# Patient Record
Sex: Female | Born: 1970
Health system: Southern US, Community
[De-identification: ages and names within clinical notes are randomized; demographics above are authoritative.]

## PROBLEM LIST (undated history)

## (undated) DIAGNOSIS — Z87442 Personal history of urinary calculi: Secondary | ICD-10-CM

## (undated) DIAGNOSIS — G43909 Migraine, unspecified, not intractable, without status migrainosus: Secondary | ICD-10-CM

## (undated) DIAGNOSIS — F329 Major depressive disorder, single episode, unspecified: Secondary | ICD-10-CM

## (undated) DIAGNOSIS — M940 Chondrocostal junction syndrome [Tietze]: Secondary | ICD-10-CM

## (undated) DIAGNOSIS — K589 Irritable bowel syndrome without diarrhea: Secondary | ICD-10-CM

## (undated) DIAGNOSIS — N200 Calculus of kidney: Secondary | ICD-10-CM

## (undated) DIAGNOSIS — M51369 Other intervertebral disc degeneration, lumbar region without mention of lumbar back pain or lower extremity pain: Secondary | ICD-10-CM

## (undated) DIAGNOSIS — M5136 Other intervertebral disc degeneration, lumbar region: Secondary | ICD-10-CM

## (undated) DIAGNOSIS — J309 Allergic rhinitis, unspecified: Secondary | ICD-10-CM

## (undated) DIAGNOSIS — R3989 Other symptoms and signs involving the genitourinary system: Secondary | ICD-10-CM

## (undated) DIAGNOSIS — T4145XA Adverse effect of unspecified anesthetic, initial encounter: Secondary | ICD-10-CM

## (undated) DIAGNOSIS — I10 Essential (primary) hypertension: Secondary | ICD-10-CM

## (undated) DIAGNOSIS — Z8719 Personal history of other diseases of the digestive system: Secondary | ICD-10-CM

## (undated) DIAGNOSIS — F419 Anxiety disorder, unspecified: Secondary | ICD-10-CM

## (undated) DIAGNOSIS — K219 Gastro-esophageal reflux disease without esophagitis: Secondary | ICD-10-CM

## (undated) DIAGNOSIS — R112 Nausea with vomiting, unspecified: Secondary | ICD-10-CM

## (undated) DIAGNOSIS — N3010 Interstitial cystitis (chronic) without hematuria: Secondary | ICD-10-CM

## (undated) DIAGNOSIS — Z86010 Personal history of colonic polyps: Secondary | ICD-10-CM

## (undated) DIAGNOSIS — Z9889 Other specified postprocedural states: Secondary | ICD-10-CM

## (undated) DIAGNOSIS — M1990 Unspecified osteoarthritis, unspecified site: Secondary | ICD-10-CM

## (undated) DIAGNOSIS — M4800 Spinal stenosis, site unspecified: Secondary | ICD-10-CM

## (undated) DIAGNOSIS — Q615 Medullary cystic kidney: Secondary | ICD-10-CM

## (undated) DIAGNOSIS — L4050 Arthropathic psoriasis, unspecified: Secondary | ICD-10-CM

## (undated) DIAGNOSIS — F32A Depression, unspecified: Secondary | ICD-10-CM

## (undated) DIAGNOSIS — T8859XA Other complications of anesthesia, initial encounter: Secondary | ICD-10-CM

## (undated) DIAGNOSIS — Z860101 Personal history of adenomatous and serrated colon polyps: Secondary | ICD-10-CM

## (undated) DIAGNOSIS — D1803 Hemangioma of intra-abdominal structures: Secondary | ICD-10-CM

## (undated) DIAGNOSIS — R3915 Urgency of urination: Secondary | ICD-10-CM

## (undated) HISTORY — DX: Calculus of kidney: N20.0

## (undated) HISTORY — DX: Irritable bowel syndrome, unspecified: K58.9

## (undated) HISTORY — DX: Arthropathic psoriasis, unspecified: L40.50

## (undated) HISTORY — DX: Spinal stenosis, site unspecified: M48.00

## (undated) HISTORY — DX: Gastro-esophageal reflux disease without esophagitis: K21.9

## (undated) HISTORY — PX: CARPAL TUNNEL RELEASE: SHX101

## (undated) HISTORY — DX: Anxiety disorder, unspecified: F41.9

## (undated) HISTORY — DX: Essential (primary) hypertension: I10

## (undated) HISTORY — DX: Medullary cystic kidney: Q61.5

## (undated) HISTORY — DX: Major depressive disorder, single episode, unspecified: F32.9

## (undated) HISTORY — PX: DIAGNOSTIC LAPAROSCOPY: SUR761

## (undated) HISTORY — DX: Chondrocostal junction syndrome (tietze): M94.0

## (undated) HISTORY — DX: Hemangioma of intra-abdominal structures: D18.03

## (undated) HISTORY — PX: DILATION AND CURETTAGE OF UTERUS: SHX78

## (undated) HISTORY — DX: Depression, unspecified: F32.A

## (undated) HISTORY — PX: PLANTAR FASCIA SURGERY: SHX746

---

## 2000-03-24 ENCOUNTER — Encounter: Payer: Self-pay | Admitting: Specialist

## 2000-03-24 ENCOUNTER — Ambulatory Visit (HOSPITAL_COMMUNITY): Admission: RE | Admit: 2000-03-24 | Discharge: 2000-03-24 | Payer: Self-pay | Admitting: Specialist

## 2000-04-09 ENCOUNTER — Ambulatory Visit (HOSPITAL_COMMUNITY): Admission: RE | Admit: 2000-04-09 | Discharge: 2000-04-09 | Payer: Self-pay | Admitting: Specialist

## 2000-04-09 ENCOUNTER — Encounter: Payer: Self-pay | Admitting: Specialist

## 2000-04-23 ENCOUNTER — Ambulatory Visit (HOSPITAL_COMMUNITY): Admission: RE | Admit: 2000-04-23 | Discharge: 2000-04-23 | Payer: Self-pay | Admitting: Specialist

## 2000-04-23 ENCOUNTER — Encounter: Payer: Self-pay | Admitting: Specialist

## 2000-05-31 ENCOUNTER — Encounter: Payer: Self-pay | Admitting: Specialist

## 2000-05-31 ENCOUNTER — Ambulatory Visit (HOSPITAL_COMMUNITY): Admission: RE | Admit: 2000-05-31 | Discharge: 2000-05-31 | Payer: Self-pay | Admitting: Specialist

## 2000-12-29 ENCOUNTER — Encounter: Payer: Self-pay | Admitting: Family Medicine

## 2000-12-29 ENCOUNTER — Ambulatory Visit (HOSPITAL_COMMUNITY): Admission: RE | Admit: 2000-12-29 | Discharge: 2000-12-29 | Payer: Self-pay | Admitting: Family Medicine

## 2001-02-01 ENCOUNTER — Other Ambulatory Visit: Admission: RE | Admit: 2001-02-01 | Discharge: 2001-02-01 | Payer: Self-pay | Admitting: *Deleted

## 2001-02-11 ENCOUNTER — Encounter: Payer: Self-pay | Admitting: Gastroenterology

## 2001-02-11 ENCOUNTER — Encounter: Admission: RE | Admit: 2001-02-11 | Discharge: 2001-02-11 | Payer: Self-pay | Admitting: Gastroenterology

## 2001-03-10 ENCOUNTER — Encounter: Admission: RE | Admit: 2001-03-10 | Discharge: 2001-03-10 | Payer: Self-pay | Admitting: Gastroenterology

## 2001-03-10 ENCOUNTER — Encounter: Payer: Self-pay | Admitting: Gastroenterology

## 2001-04-11 ENCOUNTER — Encounter: Payer: Self-pay | Admitting: Urology

## 2001-04-11 ENCOUNTER — Ambulatory Visit (HOSPITAL_COMMUNITY): Admission: RE | Admit: 2001-04-11 | Discharge: 2001-04-11 | Payer: Self-pay | Admitting: Urology

## 2001-06-15 HISTORY — PX: PERCUTANEOUS NEPHROSTOLITHOTOMY: SHX2207

## 2001-07-05 ENCOUNTER — Ambulatory Visit (HOSPITAL_COMMUNITY): Admission: RE | Admit: 2001-07-05 | Discharge: 2001-07-05 | Payer: Self-pay | Admitting: Urology

## 2001-07-05 ENCOUNTER — Encounter: Payer: Self-pay | Admitting: Urology

## 2001-08-03 ENCOUNTER — Encounter: Payer: Self-pay | Admitting: Urology

## 2001-08-03 ENCOUNTER — Ambulatory Visit (HOSPITAL_COMMUNITY): Admission: RE | Admit: 2001-08-03 | Discharge: 2001-08-03 | Payer: Self-pay | Admitting: Urology

## 2001-08-05 ENCOUNTER — Ambulatory Visit (HOSPITAL_COMMUNITY): Admission: RE | Admit: 2001-08-05 | Discharge: 2001-08-05 | Payer: Self-pay | Admitting: Urology

## 2001-08-05 ENCOUNTER — Encounter: Payer: Self-pay | Admitting: Urology

## 2001-08-26 ENCOUNTER — Encounter: Payer: Self-pay | Admitting: Urology

## 2001-08-26 ENCOUNTER — Ambulatory Visit (HOSPITAL_COMMUNITY): Admission: RE | Admit: 2001-08-26 | Discharge: 2001-08-26 | Payer: Self-pay | Admitting: Urology

## 2001-09-22 ENCOUNTER — Encounter: Payer: Self-pay | Admitting: Urology

## 2001-09-22 ENCOUNTER — Ambulatory Visit (HOSPITAL_COMMUNITY): Admission: RE | Admit: 2001-09-22 | Discharge: 2001-09-22 | Payer: Self-pay | Admitting: Urology

## 2001-10-26 ENCOUNTER — Ambulatory Visit (HOSPITAL_COMMUNITY): Admission: RE | Admit: 2001-10-26 | Discharge: 2001-10-26 | Payer: Self-pay | Admitting: Urology

## 2001-10-26 ENCOUNTER — Encounter: Payer: Self-pay | Admitting: Urology

## 2001-12-19 ENCOUNTER — Ambulatory Visit (HOSPITAL_COMMUNITY): Admission: RE | Admit: 2001-12-19 | Discharge: 2001-12-19 | Payer: Self-pay | Admitting: Urology

## 2001-12-19 ENCOUNTER — Encounter: Payer: Self-pay | Admitting: Urology

## 2001-12-21 ENCOUNTER — Ambulatory Visit (HOSPITAL_COMMUNITY): Admission: RE | Admit: 2001-12-21 | Discharge: 2001-12-21 | Payer: Self-pay | Admitting: Urology

## 2001-12-21 ENCOUNTER — Encounter: Payer: Self-pay | Admitting: Urology

## 2001-12-22 ENCOUNTER — Encounter: Payer: Self-pay | Admitting: Urology

## 2001-12-22 ENCOUNTER — Ambulatory Visit (HOSPITAL_COMMUNITY): Admission: RE | Admit: 2001-12-22 | Discharge: 2001-12-22 | Payer: Self-pay | Admitting: Urology

## 2001-12-29 ENCOUNTER — Encounter: Payer: Self-pay | Admitting: Urology

## 2001-12-29 ENCOUNTER — Ambulatory Visit (HOSPITAL_COMMUNITY): Admission: RE | Admit: 2001-12-29 | Discharge: 2001-12-29 | Payer: Self-pay | Admitting: Urology

## 2002-01-25 ENCOUNTER — Encounter: Payer: Self-pay | Admitting: Urology

## 2002-01-25 ENCOUNTER — Ambulatory Visit (HOSPITAL_COMMUNITY): Admission: RE | Admit: 2002-01-25 | Discharge: 2002-01-25 | Payer: Self-pay | Admitting: Urology

## 2002-03-13 ENCOUNTER — Ambulatory Visit (HOSPITAL_COMMUNITY): Admission: RE | Admit: 2002-03-13 | Discharge: 2002-03-13 | Payer: Self-pay | Admitting: Urology

## 2002-03-13 ENCOUNTER — Encounter: Payer: Self-pay | Admitting: Urology

## 2002-05-16 ENCOUNTER — Emergency Department (HOSPITAL_COMMUNITY): Admission: EM | Admit: 2002-05-16 | Discharge: 2002-05-16 | Payer: Self-pay | Admitting: *Deleted

## 2003-02-08 ENCOUNTER — Emergency Department (HOSPITAL_COMMUNITY): Admission: EM | Admit: 2003-02-08 | Discharge: 2003-02-08 | Payer: Self-pay | Admitting: Emergency Medicine

## 2003-07-20 ENCOUNTER — Emergency Department (HOSPITAL_COMMUNITY): Admission: EM | Admit: 2003-07-20 | Discharge: 2003-07-20 | Payer: Self-pay | Admitting: Emergency Medicine

## 2004-06-15 DIAGNOSIS — D1803 Hemangioma of intra-abdominal structures: Secondary | ICD-10-CM

## 2004-06-15 HISTORY — DX: Hemangioma of intra-abdominal structures: D18.03

## 2004-06-16 ENCOUNTER — Emergency Department (HOSPITAL_COMMUNITY): Admission: EM | Admit: 2004-06-16 | Discharge: 2004-06-16 | Payer: Self-pay | Admitting: Emergency Medicine

## 2004-06-18 ENCOUNTER — Ambulatory Visit (HOSPITAL_COMMUNITY): Admission: RE | Admit: 2004-06-18 | Discharge: 2004-06-18 | Payer: Self-pay | Admitting: Urology

## 2004-06-19 ENCOUNTER — Ambulatory Visit (HOSPITAL_COMMUNITY): Admission: RE | Admit: 2004-06-19 | Discharge: 2004-06-19 | Payer: Self-pay | Admitting: Urology

## 2004-07-02 ENCOUNTER — Ambulatory Visit (HOSPITAL_COMMUNITY): Admission: RE | Admit: 2004-07-02 | Discharge: 2004-07-02 | Payer: Self-pay | Admitting: Urology

## 2004-07-04 ENCOUNTER — Ambulatory Visit (HOSPITAL_COMMUNITY): Admission: RE | Admit: 2004-07-04 | Discharge: 2004-07-04 | Payer: Self-pay | Admitting: Urology

## 2004-08-13 ENCOUNTER — Ambulatory Visit (HOSPITAL_COMMUNITY): Admission: RE | Admit: 2004-08-13 | Discharge: 2004-08-13 | Payer: Self-pay | Admitting: Urology

## 2004-09-18 ENCOUNTER — Ambulatory Visit: Payer: Self-pay | Admitting: Internal Medicine

## 2004-12-29 ENCOUNTER — Ambulatory Visit (HOSPITAL_COMMUNITY): Admission: RE | Admit: 2004-12-29 | Discharge: 2004-12-29 | Payer: Self-pay | Admitting: Urology

## 2005-01-30 ENCOUNTER — Emergency Department (HOSPITAL_COMMUNITY): Admission: EM | Admit: 2005-01-30 | Discharge: 2005-01-30 | Payer: Self-pay | Admitting: Emergency Medicine

## 2005-03-10 ENCOUNTER — Ambulatory Visit (HOSPITAL_COMMUNITY): Admission: RE | Admit: 2005-03-10 | Discharge: 2005-03-10 | Payer: Self-pay | Admitting: Urology

## 2005-04-27 ENCOUNTER — Emergency Department (HOSPITAL_COMMUNITY): Admission: EM | Admit: 2005-04-27 | Discharge: 2005-04-27 | Payer: Self-pay | Admitting: Emergency Medicine

## 2005-05-08 ENCOUNTER — Ambulatory Visit (HOSPITAL_COMMUNITY): Admission: RE | Admit: 2005-05-08 | Discharge: 2005-05-08 | Payer: Self-pay | Admitting: Family Medicine

## 2005-06-01 ENCOUNTER — Ambulatory Visit (HOSPITAL_COMMUNITY): Admission: RE | Admit: 2005-06-01 | Discharge: 2005-06-01 | Payer: Self-pay | Admitting: Otolaryngology

## 2005-06-23 ENCOUNTER — Encounter (HOSPITAL_COMMUNITY): Admission: RE | Admit: 2005-06-23 | Discharge: 2005-07-23 | Payer: Self-pay | Admitting: Neurosurgery

## 2005-07-14 ENCOUNTER — Ambulatory Visit (HOSPITAL_COMMUNITY): Admission: RE | Admit: 2005-07-14 | Discharge: 2005-07-14 | Payer: Self-pay | Admitting: Urology

## 2005-07-27 ENCOUNTER — Encounter (HOSPITAL_COMMUNITY): Admission: RE | Admit: 2005-07-27 | Discharge: 2005-08-26 | Payer: Self-pay | Admitting: Neurosurgery

## 2005-08-28 ENCOUNTER — Encounter (HOSPITAL_COMMUNITY): Admission: RE | Admit: 2005-08-28 | Discharge: 2005-09-27 | Payer: Self-pay | Admitting: Neurosurgery

## 2005-12-31 ENCOUNTER — Ambulatory Visit (HOSPITAL_COMMUNITY): Admission: RE | Admit: 2005-12-31 | Discharge: 2005-12-31 | Payer: Self-pay | Admitting: Urology

## 2006-01-25 ENCOUNTER — Ambulatory Visit (HOSPITAL_COMMUNITY): Admission: RE | Admit: 2006-01-25 | Discharge: 2006-01-25 | Payer: Self-pay | Admitting: Urology

## 2006-08-09 ENCOUNTER — Ambulatory Visit (HOSPITAL_COMMUNITY): Admission: RE | Admit: 2006-08-09 | Discharge: 2006-08-09 | Payer: Self-pay | Admitting: Urology

## 2006-08-23 ENCOUNTER — Ambulatory Visit (HOSPITAL_COMMUNITY): Admission: RE | Admit: 2006-08-23 | Discharge: 2006-08-23 | Payer: Self-pay | Admitting: Family Medicine

## 2006-11-16 ENCOUNTER — Ambulatory Visit (HOSPITAL_COMMUNITY): Admission: RE | Admit: 2006-11-16 | Discharge: 2006-11-16 | Payer: Self-pay | Admitting: Urology

## 2006-12-20 ENCOUNTER — Ambulatory Visit (HOSPITAL_COMMUNITY): Admission: RE | Admit: 2006-12-20 | Discharge: 2006-12-20 | Payer: Self-pay | Admitting: Family Medicine

## 2007-11-15 ENCOUNTER — Ambulatory Visit (HOSPITAL_COMMUNITY): Payer: Self-pay | Admitting: Psychiatry

## 2007-12-27 ENCOUNTER — Ambulatory Visit (HOSPITAL_COMMUNITY): Payer: Self-pay | Admitting: Psychiatry

## 2008-01-26 ENCOUNTER — Encounter (HOSPITAL_COMMUNITY): Admission: RE | Admit: 2008-01-26 | Discharge: 2008-02-25 | Payer: Self-pay | Admitting: Family Medicine

## 2008-02-23 ENCOUNTER — Ambulatory Visit (HOSPITAL_COMMUNITY): Payer: Self-pay | Admitting: Psychiatry

## 2008-04-10 ENCOUNTER — Ambulatory Visit (HOSPITAL_COMMUNITY): Admission: RE | Admit: 2008-04-10 | Discharge: 2008-04-10 | Payer: Self-pay | Admitting: Urology

## 2008-05-15 ENCOUNTER — Ambulatory Visit (HOSPITAL_COMMUNITY): Payer: Self-pay | Admitting: Psychiatry

## 2008-06-26 ENCOUNTER — Ambulatory Visit (HOSPITAL_COMMUNITY): Payer: Self-pay | Admitting: Psychiatry

## 2008-07-24 ENCOUNTER — Ambulatory Visit (HOSPITAL_COMMUNITY): Admission: RE | Admit: 2008-07-24 | Discharge: 2008-07-24 | Payer: Self-pay | Admitting: Urology

## 2008-09-06 ENCOUNTER — Ambulatory Visit (HOSPITAL_COMMUNITY): Payer: Self-pay | Admitting: Psychiatry

## 2008-12-15 ENCOUNTER — Emergency Department (HOSPITAL_COMMUNITY): Admission: EM | Admit: 2008-12-15 | Discharge: 2008-12-15 | Payer: Self-pay | Admitting: Emergency Medicine

## 2009-01-29 ENCOUNTER — Ambulatory Visit (HOSPITAL_COMMUNITY): Admission: RE | Admit: 2009-01-29 | Discharge: 2009-01-29 | Payer: Self-pay | Admitting: Urology

## 2009-09-10 ENCOUNTER — Ambulatory Visit (HOSPITAL_COMMUNITY): Admission: RE | Admit: 2009-09-10 | Discharge: 2009-09-10 | Payer: Self-pay | Admitting: Family Medicine

## 2009-09-17 ENCOUNTER — Encounter: Admission: RE | Admit: 2009-09-17 | Discharge: 2009-09-17 | Payer: Self-pay | Admitting: Family Medicine

## 2009-11-05 ENCOUNTER — Ambulatory Visit (HOSPITAL_COMMUNITY): Admission: RE | Admit: 2009-11-05 | Discharge: 2009-11-05 | Payer: Self-pay | Admitting: Family Medicine

## 2009-12-24 ENCOUNTER — Ambulatory Visit (HOSPITAL_COMMUNITY): Admission: RE | Admit: 2009-12-24 | Discharge: 2009-12-24 | Payer: Self-pay | Admitting: Family Medicine

## 2010-01-24 DIAGNOSIS — K219 Gastro-esophageal reflux disease without esophagitis: Secondary | ICD-10-CM | POA: Insufficient documentation

## 2010-01-29 ENCOUNTER — Ambulatory Visit: Payer: Self-pay | Admitting: Internal Medicine

## 2010-01-29 DIAGNOSIS — K5900 Constipation, unspecified: Secondary | ICD-10-CM | POA: Insufficient documentation

## 2010-01-31 ENCOUNTER — Encounter: Payer: Self-pay | Admitting: Internal Medicine

## 2010-02-10 ENCOUNTER — Encounter (INDEPENDENT_AMBULATORY_CARE_PROVIDER_SITE_OTHER): Payer: Self-pay | Admitting: *Deleted

## 2010-02-11 ENCOUNTER — Encounter: Payer: Self-pay | Admitting: Internal Medicine

## 2010-03-28 ENCOUNTER — Encounter: Payer: Self-pay | Admitting: Specialist

## 2010-04-01 ENCOUNTER — Ambulatory Visit (HOSPITAL_COMMUNITY): Admission: RE | Admit: 2010-04-01 | Discharge: 2010-04-01 | Payer: Self-pay | Admitting: Specialist

## 2010-05-30 ENCOUNTER — Ambulatory Visit (HOSPITAL_COMMUNITY)
Admission: RE | Admit: 2010-05-30 | Discharge: 2010-05-30 | Payer: Self-pay | Source: Home / Self Care | Attending: Specialist | Admitting: Specialist

## 2010-07-15 NOTE — Assessment & Plan Note (Signed)
Summary: IBS SYMPTOMS, UPPER RIGHT ABD PAIN/ACID REFLUX/SS   Visit Type:  Initial Consult Referring Provider:  Lubertha South Primary Care Provider:  Lubertha South  Chief Complaint:  IBS symptoms/abd pain/reflux.  History of Present Illness:  40 year old lady sent over out of the courtsy of Dr. Gerda Diss to further evaluate intermittent episodes of constipation and worsening GERD symptoms. Symptoms for several years. Describe symptoms heartburn. Has been on over-the-counter acid suppressive agents with significant improvement but only short-lived. No melena or rectal bleeding; no dysphagia. Has upper abdominal /epigastric discomfort along with the heartburn. Chonically constipated and feels bloated. She uses  MiraLax for bowel function but takes it sparingly. When she has a bowel movement, she feels better. Otherwise really does any significant discomfort which she perceives procedures length to constipation. She has been on Hyomax with some improvement in cramping but states it may be making constipation worse.   Sne never had an upper GI tract evaluation. She has a history of hepatic hemangiomas -stable  going back to CT scan  in 2006. Recent ultrasound demonstrated no new abnormalities; gallbladder looked okay. LFTs were completely normal. CBC looked good except for minimally depressed hemoglobin.   Current Medications (verified): 1)  Serzone 200mg  .... Take 1 Tablet By Mouth Two Times A Day 2)  Wellbutrin Sr 150 Mg Xr12h-Tab (Bupropion Hcl) .... Take 1 Tablet By Mouth Two Times A Day 3)  Alprazolam 0.5 Mg Tabs (Alprazolam) .... Take 1 Tablet By Mouth Two Times A Day 4)  Advil 200 Mg Tabs (Ibuprofen) .... Take 1 Tablet By Mouth Two Times A Day 5)  One-A-Day Extras Antioxidant  Caps (Multiple Vitamins-Minerals) 6)  Hydrochlorothiazide 25 Mg Tabs (Hydrochlorothiazide) .... 1/2 Tablet Daily 7)  Zegerid 40-1100 Mg Caps (Omeprazole-Sodium Bicarbonate) .... Take 1 Tablet By Mouth Once A Day 8)  Hyomax-Sl  0.125 Mg Subl (Hyoscyamine Sulfate) .... As Needed  Allergies (verified): 1)  ! Sulfa 2)  ! Ultram  Past History:  Family History: Last updated: 06-Feb-2010 Father: Deceased age 36   Melenoma Mother: Deceased age 41   Schleroderma Siblings: none  Social History: Last updated: 02-06-2010 Marital Status: No Children: No Occupation: Human resources officer  Past Medical History: Hypertension Anxiety and Depression Reflux IBS  Past Surgical History: Kidney stone surgery x 2     2011  (numberous others ) Right Foot surgery  Family History: Father: Deceased age 66   Melenoma Mother: Deceased age 67   Schleroderma Siblings: none  Social History: Marital Status: No Children: No Occupation: Human resources officer  Vital Signs:  Patient profile:   40 year old female Height:      63 inches Weight:      164.50 pounds BMI:     29.25 Temp:     97.9 degrees F oral Pulse rate:   72 / minute BP sitting:   120 / 80  (left arm) Cuff size:   regular  Vitals Entered By: Cloria Spring LPN (February 06, 2010 11:28 AM)  Physical Exam  General:  alert conversant pleasant lady resting comfortably Lungs:  clear to auscultation Heart:  regular rate rhythm without murmur gallop rub Abdomen:  flat positive bowel sounds soft she does have epigastric tender to palpation no appreciable mass or hepatosplenomegaly  Impression & Recommendations: Impression: Pleasant 40 year old lady with insidious worsening typical reflux symptoms without any alarm features. She does have some symptoms consistent with dyspepsia and irritable bowel syndrome. She does relate some improvement to acid suppression therapy but really hasn't taken substantial acid  suppression therapy for any period of time. She has constipation which is more likely related to functional illness.  Antispasmodic therapy may have worsened constipation recently.  Stable hepatic hemangiomas on recent imaging. LFTs are normal. Gallbladder looks okay on  ultrasound.  Recommendations: Proceed with a diagnostic EGD in the near future to further evaluate crescendo, worsening reflux symptoms and further evaluate her epigastric pain. I discussed this approach with this nice lady.  Risks, benefits, limitations, alternatives and imponderables have been reviewed;  her questions have been answered; she is agreeable.  In addition, I'd like her to liberalize use of MiraLax i.e. take 17 g orally at bedtime if no bowel movement on any given day. Our goal being to facilitate a bowel movement daily to every other day. I have also asked her to stop Hyomax.  Begin digestive advantage probiotic formula for constipation. I provided her with samples.  Appended Document: Orders Update    Clinical Lists Changes  Problems: Added new problem of CONSTIPATION (ICD-564.00) Orders: Added new Service order of Consultation Level IV [phone]) - Signed

## 2010-07-15 NOTE — Letter (Signed)
Summary: removal of wrong DX codes

## 2010-07-15 NOTE — Letter (Signed)
Summary: Internal Other /EGD ORDER  Internal Other /EGD ORDER   Imported By: Cloria Spring LPN 16/03/9603 54:09:81  _____________________________________________________________________  External Attachment:    Type:   Image     Comment:   External Document

## 2010-07-15 NOTE — Letter (Signed)
Summary: RADIOLOGY U/S ABDOMEN   RADIOLOGY U/S ABDOMEN   Imported By: Rexene Alberts 02/11/2010 15:58:21  _____________________________________________________________________  External Attachment:    Type:   Image     Comment:   External Document

## 2010-07-15 NOTE — Letter (Signed)
Summary: LABS FROM [facility]  LABS FROM [facility]   Imported By: Rexene Alberts 02/11/2010 15:59:32  _____________________________________________________________________  External Attachment:    Type:   Image     Comment:   External Document

## 2010-08-25 LAB — CBC
HCT: 37.5 % (ref 36.0–46.0)
Hemoglobin: 12.1 g/dL (ref 12.0–15.0)
MCH: 28.5 pg (ref 26.0–34.0)
MCHC: 32.3 g/dL (ref 30.0–36.0)
Platelets: 315 10*3/uL (ref 150–400)
RBC: 4.25 MIL/uL (ref 3.87–5.11)
RDW: 13.9 % (ref 11.5–15.5)
WBC: 7.1 10*3/uL (ref 4.0–10.5)

## 2010-08-25 LAB — BASIC METABOLIC PANEL
CO2: 29 mEq/L (ref 19–32)
GFR calc Af Amer: >60 mL/min (ref >60)
Potassium: 3.7 mEq/L (ref 3.5–5.1)
Sodium: 138 mEq/L (ref 135–145)

## 2010-08-25 LAB — SURGICAL PCR SCREEN: MRSA, PCR: NEGATIVE

## 2010-08-28 LAB — CBC
Hemoglobin: 11.5 g/dL — ABNORMAL LOW (ref 12.0–15.0)
MCV: 88.7 fL (ref 78.0–100.0)
RDW: 13.5 % (ref 11.5–15.5)

## 2010-08-28 LAB — BASIC METABOLIC PANEL
Creatinine, Ser: 0.84 mg/dL (ref 0.4–1.2)
Glucose, Bld: 85 mg/dL (ref 70–99)
Sodium: 139 mEq/L (ref 135–145)

## 2010-08-28 LAB — SURGICAL PCR SCREEN
MRSA, PCR: NEGATIVE
Staphylococcus aureus: NEGATIVE

## 2010-09-15 LAB — LIPID PANEL
Cholesterol: 192 mg/dL (ref 0–200)
LDL Cholesterol: 103 mg/dL
Triglycerides: 87 mg/dL (ref 40–160)

## 2010-09-23 ENCOUNTER — Encounter: Payer: Self-pay | Admitting: Gastroenterology

## 2010-09-23 ENCOUNTER — Ambulatory Visit (INDEPENDENT_AMBULATORY_CARE_PROVIDER_SITE_OTHER): Payer: Self-pay | Admitting: Gastroenterology

## 2010-09-23 DIAGNOSIS — R1013 Epigastric pain: Secondary | ICD-10-CM | POA: Insufficient documentation

## 2010-09-23 DIAGNOSIS — K589 Irritable bowel syndrome without diarrhea: Secondary | ICD-10-CM

## 2010-09-23 DIAGNOSIS — K625 Hemorrhage of anus and rectum: Secondary | ICD-10-CM

## 2010-09-23 DIAGNOSIS — K219 Gastro-esophageal reflux disease without esophagitis: Secondary | ICD-10-CM

## 2010-09-23 MED ORDER — PEG 3350-KCL-NA BICARB-NACL 420 G PO SOLR: ORAL | Status: AC

## 2010-09-23 MED ORDER — HYOSCYAMINE SULFATE 0.125 MG SL SUBL: 0.1250 mg | SUBLINGUAL_TABLET | SUBLINGUAL | Status: AC | PRN

## 2010-09-23 MED ORDER — ESOMEPRAZOLE MAGNESIUM 40 MG PO CPDR: 40.0000 mg | DELAYED_RELEASE_CAPSULE | Freq: Every day | ORAL | Status: DC

## 2010-09-23 NOTE — Assessment & Plan Note (Signed)
Likely dyspepsia but need to rule out gastritis, peptic ulcer disease. EGD in the near future.  I have discussed the risks, alternatives, benefits with regards to but not limited to the risk of reaction to medication, bleeding, infection, perforation and the patient is agreeable to proceed. Written consent to be obtained.

## 2010-09-23 NOTE — Assessment & Plan Note (Signed)
Chronic GERD without alarm symptoms at this time. She does have some epigastric pain which may be due to dyspepsia but recommend further evaluation. EGD in the near future. I have discussed the risks, alternatives, benefits with regards to but not limited to the risk of reaction to medication, bleeding, infection, perforation and the patient is agreeable to proceed. Written consent to be obtained.

## 2010-09-23 NOTE — Progress Notes (Signed)
Primary Care Physician:  Harlow Asa, MD  Primary Gastroenterologist:  Dr. Jonette Eva  Chief Complaint  Patient presents with  . Abdominal Pain       . Rectal Bleeding    HPI:  Bethany King is a 40 y.o. female here for further evaluation of abdominal pain and rectal bleeding. She has a history of chronic GERD, symptoms poorly controlled with Prevacid 15 mg daily which she buys over-the-counter. Has done very well in the past on Nexium but her insurance card would not cover this before. Denies any dysphagia. Never had EGD last year, driver never showed up. Heartburn all the time. Abdominal pain constant unrelated to meals. Started back on Bentyl but complains of drowsiness. Previously did well and hyoscyamine. Eating bland diet. Minimal caffeine intake. No vomiting. Some nausea. BM normal, twice daily. No diarrhea. Two weeks ago, moderate volume brbpr, rectal itching/burning. Happened three times. None in two weeks. In past some brbpr with hard stools. No prior EGD/TCS. Second cousin had colon cancer at age 34, patient here,Bethany King. Patient has seen Dr. Jena Gauss in the past but would like to be seen by Dr. Darrick Penna as she follows multiple of her family members.   Current Outpatient Prescriptions  Medication Sig Dispense Refill  . dicyclomine (BENTYL) 10 MG capsule Take 10 mg by mouth 2 (two) times daily.        . hydrochlorothiazide (,MICROZIDE/HYDRODIURIL,) 12.5 MG capsule Take 12.5 mg by mouth daily.        . lansoprazole (PREVACID) 15 MG capsule Take 15 mg by mouth daily.        Marland Kitchen loratadine (CLARITIN) 10 MG tablet Take 10 mg by mouth daily.        . Vilazodone HCl (VIIBRYD) 40 MG TABS Take 1 tablet by mouth daily.        Marland Kitchen esomeprazole (NEXIUM) 40 MG capsule Take 1 capsule (40 mg total) by mouth daily before breakfast.  30 capsule  11  . hyoscyamine (HYOMAX-SL) 0.125 MG SL tablet Place 1 tablet (0.125 mg total) under the tongue every 4 (four) hours as needed for cramping.  120  tablet  0    Allergies as of 09/23/2010 - Review Complete 09/23/2010  Allergen Reaction Noted  . Tramadol hcl Other (See Comments)   . Sulfonamide derivatives Rash     Past Medical History  Diagnosis Date  . GERD (gastroesophageal reflux disease)   . Irritable bowel syndrome (IBS)   . HTN (hypertension)   . Anxiety   . Depression   . Multiple hemangiomas 2006    hepatic, stable    Past Surgical History  Procedure Date  . Carpel tunnel release 03/2010 and 05/2010    bilateral  . Kidney stone surgery     numerous  . Foot surgery     right    Family History  Problem Relation Age of Onset  . Irritable bowel syndrome Mother   . Colon cancer Cousin 72    second cousin Nettie [place])  . Scleroderma Mother   . Melanoma Father     History   Social History  . Marital Status: Divorced    Spouse Name: N/A    Number of Children: 0  . Years of Education: N/A   Occupational History  . ORDER PROCESSER [employer]   Social History Main Topics  . Smoking status: Never Smoker   . Smokeless tobacco: Never Used  . Alcohol Use: No  . Drug Use: No  . Sexually  Active: No   Other Topics Concern  . Not on file   Social History Narrative  . No narrative on file      ROS:  General: Negative for anorexia, weight loss, fever, chills, fatigue, weakness. Eyes: Negative for vision changes.  ENT: Negative for hoarseness, difficulty swallowing , nasal congestion. CV: Negative for chest pain, angina, palpitations, dyspnea on exertion, peripheral edema.  Respiratory: Negative for dyspnea at rest, dyspnea on exertion, cough, sputum, wheezing.  GI: See history of present illness. GU:  Negative for dysuria, hematuria, urinary incontinence, urinary frequency, nocturnal urination.  MS: Negative for joint pain, low back pain.  Derm: Negative for rash or itching.  Neuro: Negative for weakness, abnormal sensation, seizure, frequent headaches, memory loss, confusion.  Psych:  Negative for anxiety, depression, suicidal ideation, hallucinations.  Endo: Negative for unusual weight change.  Heme: Negative for bruising or bleeding. Allergy: Negative for rash or hives.    Physical Examination: BP 133/80  Pulse 69  Temp 98.4 F (36.9 C)  Ht 5\' 3"  (1.6 m)  Wt 170 lb 9.6 oz (77.384 kg)  BMI 30.22 kg/m2  SpO2 98%  LMP 09/05/2010   General: Well-nourished, well-developed in no acute distress.  Head: Normocephalic, atraumatic.   Eyes: Conjunctiva pink, no icterus. Mouth: Oropharyngeal mucosa moist and pink , no lesions erythema or exudate. Neck: Supple without thyromegaly, masses, or lymphadenopathy.  Lungs: Clear to auscultation bilaterally.  Heart: Regular rate and rhythm, no murmurs rubs or gallops.  Abdomen: Bowel sounds are normal, mild epigastric tenderness, nondistended, no hepatosplenomegaly or masses, no abdominal bruits or    hernia , no rebound or guarding.   Extremities: No lower extremity edema.  Neuro: Alert and oriented x 4 , grossly normal neurologically.  Skin: Warm and dry, no rash or jaundice.   Psych: Alert and cooperative, normal mood and affect.

## 2010-09-23 NOTE — Assessment & Plan Note (Signed)
Recent moderate volume hematochezia, multiple episodes. Previous small volume hematochezia with hard stools. Recent episode occurred with soft stools. Suspect benign anorectal source however patient has never had a colonoscopy. No known first-degree relatives with colon polyps or colon cancer. Second cousin with colon cancer at age 42. Patient does not have any siblings and her parents died of other causes that are early age. Colonoscopy in the near future. I have discussed the risks, alternatives, benefits with regards to but not limited to the risk of reaction to medication, bleeding, infection, perforation and the patient is agreeable to proceed. Written consent to be obtained.

## 2010-09-23 NOTE — Assessment & Plan Note (Signed)
The bowel pattern is stable at this time. Some intermittent abdominal cramping. Bentyl causes drowsiness. Will DC and resume hyoscyamine.

## 2010-09-24 ENCOUNTER — Encounter: Payer: Self-pay | Admitting: Internal Medicine

## 2010-09-29 ENCOUNTER — Other Ambulatory Visit (HOSPITAL_COMMUNITY): Payer: Self-pay | Admitting: Family Medicine

## 2010-09-29 DIAGNOSIS — Z139 Encounter for screening, unspecified: Secondary | ICD-10-CM

## 2010-10-06 ENCOUNTER — Encounter: Payer: 59 | Admitting: Gastroenterology

## 2010-10-13 ENCOUNTER — Ambulatory Visit (HOSPITAL_COMMUNITY)
Admission: RE | Admit: 2010-10-13 | Discharge: 2010-10-13 | Disposition: A | Discharge status: Home / Self Care | Payer: 59 | Source: Ambulatory Visit | Attending: Gastroenterology | Admitting: Gastroenterology

## 2010-10-13 ENCOUNTER — Encounter: Payer: 59 | Admitting: Gastroenterology

## 2010-10-13 ENCOUNTER — Other Ambulatory Visit: Payer: Self-pay | Admitting: Gastroenterology

## 2010-10-13 DIAGNOSIS — Z79899 Other long term (current) drug therapy: Secondary | ICD-10-CM | POA: Insufficient documentation

## 2010-10-13 DIAGNOSIS — K2961 Other gastritis with bleeding: Secondary | ICD-10-CM

## 2010-10-13 DIAGNOSIS — K5730 Diverticulosis of large intestine without perforation or abscess without bleeding: Secondary | ICD-10-CM | POA: Insufficient documentation

## 2010-10-13 DIAGNOSIS — R109 Unspecified abdominal pain: Secondary | ICD-10-CM | POA: Insufficient documentation

## 2010-10-13 DIAGNOSIS — D131 Benign neoplasm of stomach: Secondary | ICD-10-CM | POA: Insufficient documentation

## 2010-10-13 DIAGNOSIS — K648 Other hemorrhoids: Secondary | ICD-10-CM | POA: Insufficient documentation

## 2010-10-13 DIAGNOSIS — D126 Benign neoplasm of colon, unspecified: Secondary | ICD-10-CM | POA: Insufficient documentation

## 2010-10-13 DIAGNOSIS — K625 Hemorrhage of anus and rectum: Secondary | ICD-10-CM | POA: Insufficient documentation

## 2010-10-13 DIAGNOSIS — I10 Essential (primary) hypertension: Secondary | ICD-10-CM | POA: Insufficient documentation

## 2010-10-13 HISTORY — PX: COLONOSCOPY: SHX174

## 2010-10-14 HISTORY — PX: ESOPHAGOGASTRODUODENOSCOPY: SHX1529

## 2010-10-16 ENCOUNTER — Ambulatory Visit (HOSPITAL_COMMUNITY)
Admission: RE | Admit: 2010-10-16 | Discharge: 2010-10-16 | Disposition: A | Discharge status: Home / Self Care | Payer: 59 | Source: Ambulatory Visit | Attending: Family Medicine | Admitting: Family Medicine

## 2010-10-16 DIAGNOSIS — Z139 Encounter for screening, unspecified: Secondary | ICD-10-CM

## 2010-10-16 DIAGNOSIS — Z1231 Encounter for screening mammogram for malignant neoplasm of breast: Secondary | ICD-10-CM | POA: Insufficient documentation

## 2010-10-19 ENCOUNTER — Inpatient Hospital Stay (HOSPITAL_COMMUNITY)
Admission: EM | Admit: 2010-10-19 | Discharge: 2010-10-22 | DRG: 392 | Disposition: A | Discharge status: Home / Self Care | Payer: 59 | Source: Ambulatory Visit | Attending: Otolaryngology | Admitting: Otolaryngology

## 2010-10-19 ENCOUNTER — Emergency Department (HOSPITAL_COMMUNITY): Payer: 59

## 2010-10-19 DIAGNOSIS — F329 Major depressive disorder, single episode, unspecified: Secondary | ICD-10-CM | POA: Diagnosis present

## 2010-10-19 DIAGNOSIS — F3289 Other specified depressive episodes: Secondary | ICD-10-CM | POA: Diagnosis present

## 2010-10-19 DIAGNOSIS — K648 Other hemorrhoids: Secondary | ICD-10-CM | POA: Diagnosis present

## 2010-10-19 DIAGNOSIS — K5289 Other specified noninfective gastroenteritis and colitis: Principal | ICD-10-CM | POA: Diagnosis present

## 2010-10-19 DIAGNOSIS — Z860100 Personal history of colon polyps, unspecified: Secondary | ICD-10-CM

## 2010-10-19 DIAGNOSIS — I10 Essential (primary) hypertension: Secondary | ICD-10-CM | POA: Diagnosis present

## 2010-10-19 DIAGNOSIS — D1803 Hemangioma of intra-abdominal structures: Secondary | ICD-10-CM | POA: Diagnosis present

## 2010-10-19 DIAGNOSIS — R946 Abnormal results of thyroid function studies: Secondary | ICD-10-CM | POA: Diagnosis present

## 2010-10-19 DIAGNOSIS — E876 Hypokalemia: Secondary | ICD-10-CM | POA: Diagnosis present

## 2010-10-19 DIAGNOSIS — D62 Acute posthemorrhagic anemia: Secondary | ICD-10-CM | POA: Diagnosis present

## 2010-10-19 DIAGNOSIS — K589 Irritable bowel syndrome without diarrhea: Secondary | ICD-10-CM | POA: Diagnosis present

## 2010-10-19 LAB — CBC
HCT: 37.7 % (ref 36.0–46.0)
MCH: 29 pg (ref 26.0–34.0)
MCH: 29.1 pg (ref 26.0–34.0)
MCHC: 32.3 g/dL (ref 30.0–36.0)
MCHC: 32.4 g/dL (ref 30.0–36.0)
MCV: 89.6 fL (ref 78.0–100.0)
MCV: 90 fL (ref 78.0–100.0)
Platelets: 302 10*3/uL (ref 150–400)
RBC: 4.14 MIL/uL (ref 3.87–5.11)
RDW: 13.7 % (ref 11.5–15.5)

## 2010-10-19 LAB — DIFFERENTIAL
Basophils Relative: 0 % (ref 0–1)
Eosinophils Absolute: 0 10*3/uL (ref 0.0–0.7)
Eosinophils Absolute: 0.1 10*3/uL (ref 0.0–0.7)
Eosinophils Relative: 0 % (ref 0–5)
Eosinophils Relative: 0 % (ref 0–5)
Lymphocytes Relative: 11 % — ABNORMAL LOW (ref 12–46)
Lymphs Abs: 1 10*3/uL (ref 0.7–4.0)
Lymphs Abs: 1.3 10*3/uL (ref 0.7–4.0)
Monocytes Absolute: 0.4 10*3/uL (ref 0.1–1.0)
Monocytes Absolute: 0.7 10*3/uL (ref 0.1–1.0)
Monocytes Relative: 4 % (ref 3–12)
Monocytes Relative: 6 % (ref 3–12)

## 2010-10-19 LAB — URINALYSIS, ROUTINE W REFLEX MICROSCOPIC
Bilirubin Urine: NEGATIVE
Ketones, ur: NEGATIVE mg/dL
Leukocytes, UA: NEGATIVE
Nitrite: NEGATIVE
Protein, ur: NEGATIVE mg/dL
Urobilinogen, UA: 0.2 mg/dL (ref 0.0–1.0)
pH: 5.5 (ref 5.0–8.0)

## 2010-10-19 LAB — COMPREHENSIVE METABOLIC PANEL
AST: 23 U/L (ref 0–37)
Albumin: 3.8 g/dL (ref 3.5–5.2)
BUN: 16 mg/dL (ref 6–23)
Calcium: 8.7 mg/dL (ref 8.4–10.5)
Chloride: 103 mEq/L (ref 96–112)
Creatinine, Ser: 0.74 mg/dL (ref 0.4–1.2)
GFR calc Af Amer: >60 mL/min (ref >60)
GFR calc non Af Amer: >60 mL/min (ref >60)
Total Bilirubin: 0.8 mg/dL (ref 0.3–1.2)

## 2010-10-19 LAB — POCT PREGNANCY, URINE: Preg Test, Ur: NEGATIVE

## 2010-10-19 MED ORDER — IOHEXOL 300 MG/ML  SOLN
100.0000 mL | Freq: Once | INTRAMUSCULAR | Status: AC | PRN
  Administered 2010-10-19: 100 mL via INTRAVENOUS

## 2010-10-20 DIAGNOSIS — D649 Anemia, unspecified: Secondary | ICD-10-CM

## 2010-10-20 DIAGNOSIS — K5289 Other specified noninfective gastroenteritis and colitis: Secondary | ICD-10-CM

## 2010-10-20 DIAGNOSIS — K219 Gastro-esophageal reflux disease without esophagitis: Secondary | ICD-10-CM

## 2010-10-20 LAB — DIFFERENTIAL
Eosinophils Absolute: 0 10*3/uL (ref 0.0–0.7)
Lymphs Abs: 1.6 10*3/uL (ref 0.7–4.0)
Monocytes Absolute: 0.7 10*3/uL (ref 0.1–1.0)
Monocytes Relative: 8 % (ref 3–12)
Neutrophils Relative %: 73 % (ref 43–77)

## 2010-10-20 LAB — CBC
MCH: 29 pg (ref 26.0–34.0)
MCV: 91 fL (ref 78.0–100.0)
Platelets: 287 10*3/uL (ref 150–400)
RBC: 4.11 MIL/uL (ref 3.87–5.11)

## 2010-10-20 LAB — BASIC METABOLIC PANEL
BUN: 10 mg/dL (ref 6–23)
CO2: 30 mEq/L (ref 19–32)
Chloride: 105 mEq/L (ref 96–112)
Creatinine, Ser: 0.69 mg/dL (ref 0.4–1.2)
GFR calc Af Amer: >60 mL/min (ref >60)
Glucose, Bld: 121 mg/dL — ABNORMAL HIGH (ref 70–99)

## 2010-10-20 NOTE — H&P (Signed)
Bethany King, Bethany King             ACCOUNT NO.:  [id number]  MEDICAL RECORD NO.:  [id number]           PATIENT TYPE:  I  LOCATION:  [facility]02                          FACILITY:  [facility]  PHYSICIAN:  Elliot Cousin, M.D.    DATE OF BIRTH:  23-May-1971  DATE OF ADMISSION:  10/19/2010 DATE OF DISCHARGE:  LH                             HISTORY & PHYSICAL   PRIMARY CARE PHYSICIAN:  W. Simone Curia, MD  PRIMARY GASTROENTEROLOGIST:  Jonette Eva, MD  CHIEF COMPLAINT:  Bloody diarrhea and abdominal pain.  HISTORY OF PRESENT ILLNESS:  The patient is a 40 year old woman with a past medical history significant for hypertension and depression.  She was recently diagnosed with small internal hemorrhoids, colon polyps, and gastritis following an EGD and colonoscopy performed by Dr. Darrick Penna on September 26, 2010.  Yesterday, she developed generalized malaise, headache, and abdominal bloating.  Later, she began to have lower abdominal cramping pain.  At approximately 5:30 a.m. this morning, the lower abdominal cramping intensified.  She began to have loose and watery stools.  Between 5:30 a.m. and 10:30 a.m. this morning, she had approximately eight or nine bowel movements.  With the fifth and sixth bowel movements, she noticed a moderate amount of blood in her stools. The blood was bright red.  There was no black tarriness or maroon- colored stools.  The abdominal cramping has been constant since this morning.  Having a bowel movement did not relieve any of the cramping. She rates the abdominal pain more than a 10/10 in intensity.  She attempted to eat a small amount of a waffle this morning, but she became nauseated and was unable to finish it.  She has had no vomiting and she did not vomit this morning.  She has had no pain with urination.  She denies associated fever or chills.  In the emergency department, the patient is noted to be afebrile and hemodynamically stable.  Her lipase is within normal  limits at 30.  Her serum potassium is low at 3.3.  Her liver transaminases are within normal limits.  Her hemoglobin is 12.0.  The CT scan of her abdomen and pelvis reveals ascending and transverse colonic wall thickening and pericolonic stranding; no evidence for acute appendicitis; there are multiple bilateral intrarenal calculi present and multiple hepatic hemangiomas.  The patient is being admitted for further evaluation and management.  PAST MEDICAL HISTORY: 1. Status post colonoscopy and polypectomy on September 26, 2010.  The     colonoscopy revealed small internal hemorrhoids, single ascending     colon diverticulum, 4 mm sessile sigmoid polyps, removed via cold     forceps. 2. EGD on October 13, 2010 by revealed a 3 to 4 mm sessile     gastric polyps in the proximal stomach, status post biopsies.     Streaky erythema without erosion or ulcerations seen in the body     and the antrum.  Status post biopsies to evaluate for H. pylori.     The pathology report revealed no evidence of H. pylori.  It did     reveal minimal chronic inflammation and reactive changes. 3.  Hypertension. 4. Depression with occasional anxiety. 5. Longstanding nephrolithiasis.  Status post left percutaneous     nephrostolithotomy, stone extractions, and stent placements in     January 2006 by Dr. Dennie Maizes and again in February 2011 by     Dr. August Saucer Assimos at [facility]. 6. Status post bilateral carpal tunnel release in 2011.  MEDICATIONS: 1. Hydrochlorothiazide 12.5 mg daily. 2. Nexium 40 mg daily. 3. Hyoscyamine sulfate 1 tablet two to three times daily as needed. 4. Viibryd 40 mg daily.  ALLERGIES:  The patient has allergies to SULFA DRUGS AND TRAMADOL.  SOCIAL HISTORY:  The patient is divorced.  [place]  She has no children.  She is employed at a Furniture conservator/restorer.  She denies tobacco, alcohol or illicit drug use.  FAMILY HISTORY:  The patient's mother died  of scleroderma at 21 years of age.  Her father died of melanoma at 20 years of age.  REVIEW OF SYSTEMS:  The patient's review of systems is as above in the history present illness.  In addition, she has had a history of epigastric pain and rectal bleeding.  Otherwise, review of systems is negative.  PHYSICAL EXAMINATION:  VITAL SIGNS:  Temperature 97.9, blood pressure 122/81, pulse 60, respiratory rate 16, oxygen saturation 98% on room air. GENERAL:  The patient is a pleasant 40 year old Caucasian woman who is currently lying in bed, in no acute distress although she does appear to be not feeling well. HEENT:  Head is normocephalic, nontraumatic.  Pupils equal, round, reactive to light.  Extraocular muscles are intact.  Conjunctivae are clear.  Sclerae are white.  Tympanic membranes not examined.  Nasal mucosa is moist.  No sinus tenderness.  Oropharynx reveals moist mucous membranes.  No posterior exudates or erythema. NECK:  Supple.  No adenopathy, no thyromegaly, no bruit, no JVD. LUNGS:  Clear to auscultation bilaterally. HEART:  S1, S2 with a 2/6 systolic murmur. ABDOMEN:  Mildly obese, positive bowel sounds, soft, moderately tender in the epigastrium and the left greater than right lower quadrants.  No rebound and no distention.  No masses palpated. GU AND RECTAL:  Deferred. EXTREMITIES:  Pedal pulses are palpable bilaterally.  No pretibial edema.  No pedal edema. NEUROLOGIC:  The patient is alert and oriented x3.  Cranial nerves II- XII are intact.  Strength is 5/5 throughout. PSYCHOLOGIC:  The patient is cooperative.  Her speech is clear.  Her affect is pleasant.  ADMISSION LABORATORIES:  The results of the CT scan of the abdomen and pelvis were dictated above.  Lipase 30.  Sodium 140, potassium 3.3, chloride 103, CO2 29, glucose 86, BUN 16, creatinine 0.74, total bilirubin 0.8, alkaline phosphatase 59, SGOT 23, SGPT 22, total protein 6.5, albumin 3.8, calcium 8.7.  WBC  9.8, hemoglobin 12.0, platelet count 302.  Urinalysis trace of blood and specific gravity less than 1.005. Urine pregnancy test negative.  ASSESSMENT: 1. Diarrhea with associated gastrointestinal bleeding.  The patient's     CT scan revealed findings consistent with colitis.  The patient is     also status post polypectomy 1 week ago and therefore it is     possible that her bleeding could be secondary to post polypectomy     bleeding. 2. Associated abdominal cramping.  The patient appears to be very     uncomfortable due to the cramping.  Her abdomen is fairly soft on     exam. 3. Longstanding bilateral renal calculi.  The patient  has only a trace     of blood on the urinalysis, but 0 to 2 RBCs on the micro urine.     Her renal function is within normal limits. 4. Hepatic hemangiomas per CT of the abdomen. 5. Hypokalemia.  The patient's serum potassium is 3.3.  It is low due     to diarrhea and hydrochlorothiazide therapy. 6. Hypertension.  The patient's blood pressure is within normal     limits.  She was treated chronically with hydrochlorothiazide. 7. Gastritis and gastric polyps.  This was seen during the recent     upper endoscopy on September 26, 2010 by Dr. Darrick Penna.  The patient is     treated with Nexium as an outpatient.  She also has a history of     possible irritable bowel syndrome and was prescribed hyoscyamine as     needed. 8. Depression.  The patient is treated chronically with Viibryd.  PLAN: 1. Emergency department physician Dr. Deretha Emory discussed the patient     and the findings with gastroenterologist, Dr. Darrick Penna.  She is aware     that the patient is being admitted and she will evaluate the     patient later today or in the morning. 2. We will start Flagyl and Cipro empirically.  She is afebrile and     her white blood cell count is within normal limits.  However,     starting Flagyl and Cipro empirically may be beneficial and     probably would not hurt, given  the diarrhea. 3. We will replete her potassium chloride in the IV fluids.  We will     check a blood magnesium level.  If it is low, we will replete her     intravenously. 4. We will type and screen 2 units of packed red blood cells and hold     them.  We will monitor her hemoglobin and hematocrit every 6 hours     for the next 24 hours. 5. We will treat her pain with as needed Dilaudid.  We will treat her     nausea with as needed Zofran and Phenergan.  We will continue     proton pump inhibitor therapy with intravenous Protonix. 6. We will assess for C diff with C diff PCR.     Elliot Cousin, M.D.     DF/MEDQ  D:  10/19/2010  T:  10/19/2010  Job:  [id number]  cc:   Donna Bernard, M.D. Fax: [phone]  Jonette Eva, M.D. [address] [place] , [place] [address]  Electronically Signed by Elliot Cousin M.D. on 10/20/2010 10:24:07 AM

## 2010-10-21 LAB — BASIC METABOLIC PANEL
CO2: 28 mEq/L (ref 19–32)
Calcium: 8.4 mg/dL (ref 8.4–10.5)
Glucose, Bld: 92 mg/dL (ref 70–99)
Potassium: 3.1 mEq/L — ABNORMAL LOW (ref 3.5–5.1)
Sodium: 141 mEq/L (ref 135–145)

## 2010-10-21 LAB — URINE CULTURE
Colony Count: NO GROWTH
Culture  Setup Time: 201205072000
Culture: NO GROWTH

## 2010-10-21 LAB — DIFFERENTIAL
Eosinophils Relative: 2 % (ref 0–5)
Lymphocytes Relative: 26 % (ref 12–46)
Lymphs Abs: 1.8 10*3/uL (ref 0.7–4.0)
Monocytes Absolute: 0.8 10*3/uL (ref 0.1–1.0)

## 2010-10-21 LAB — CBC
HCT: 32.4 % — ABNORMAL LOW (ref 36.0–46.0)
MCH: 29.2 pg (ref 26.0–34.0)
MCHC: 32.1 g/dL (ref 30.0–36.0)
MCV: 91 fL (ref 78.0–100.0)
RDW: 13.9 % (ref 11.5–15.5)

## 2010-10-21 LAB — HEMOGLOBIN AND HEMATOCRIT, BLOOD: Hemoglobin: 10.9 g/dL — ABNORMAL LOW (ref 12.0–15.0)

## 2010-10-22 DIAGNOSIS — K5289 Other specified noninfective gastroenteritis and colitis: Secondary | ICD-10-CM

## 2010-10-22 LAB — BASIC METABOLIC PANEL
Calcium: 8.7 mg/dL (ref 8.4–10.5)
Creatinine, Ser: 0.59 mg/dL (ref 0.4–1.2)
GFR calc Af Amer: >60 mL/min (ref >60)
GFR calc non Af Amer: >60 mL/min (ref >60)

## 2010-10-22 LAB — HEMOGLOBIN AND HEMATOCRIT, BLOOD
HCT: 30.5 % — ABNORMAL LOW (ref 36.0–46.0)
Hemoglobin: 9.7 g/dL — ABNORMAL LOW (ref 12.0–15.0)

## 2010-10-23 LAB — CROSSMATCH
ABO/RH(D): O POS
Antibody Screen: NEGATIVE
Unit division: 0

## 2010-10-24 ENCOUNTER — Telehealth: Payer: Self-pay

## 2010-10-24 LAB — STOOL CULTURE

## 2010-10-24 NOTE — Telephone Encounter (Signed)
Left the message on VM for pt, and asked her to return call and let me know she got it.

## 2010-10-24 NOTE — Telephone Encounter (Signed)
Per Darl Pikes, pt returned call and said she received the message and understood.

## 2010-10-24 NOTE — Telephone Encounter (Signed)
Please call pt. She may resume her iron today. She should take [facility] probiotic daily. It's much cheaper than all other probiotics.

## 2010-10-24 NOTE — Telephone Encounter (Signed)
Pt called and said she was [facility] for colitis. Said she was told to get a probiotic that was $55.00 and she wanted to know if there was something cheaper. I told her Karn Pickler is also OTC and it would be some cheaper than the other one. She is aware that Dr. Darrick Penna is out of town and will not be back until next week. She wants to know when she will be able to resume her iron, since she has been told not to take it for a little while. She will be going back to work next week, and can only be reached between 11-2:30 daily.

## 2010-10-31 NOTE — H&P (Signed)
Bethany King, Bethany King             ACCOUNT NO.:  [id number]   MEDICAL RECORD NO.:  [id number]          PATIENT TYPE:  AMB   LOCATION:  DAY                           FACILITY:  [facility]   PHYSICIAN:  Dennie Maizes, M.D.   DATE OF BIRTH:  1971-04-13   DATE OF ADMISSION:  06/19/2004  DATE OF DISCHARGE:  LH                                HISTORY & PHYSICAL   CHIEF COMPLAINT:  Severe left flank pain, nausea, vomiting.   HISTORY OF PRESENT ILLNESS:  This 40 year old female has a history of  recurrent urolithiasis.  She has recurrent stone disease associated with  medullary sponge kidneys.  She has had numerous stones in the past.  She has  had multiple procedures for stone disease, patient has several ESWL's as  well as ureteroscopy stone extraction, she has also undergone left  percutaneous nephrostolithotomy at [facility] 2 years ago.   She experienced on and off the severe left flank pain radiating to the front  3 days ago.  She went to the emergency room at [facility].  She had  severe nausea and vomiting.  A noncontrast CT scan of the abdomen and pelvis  revealed bilateral small renal calculi, there was a 9 mm size left renal  calculus of the lower ureteropelvic junction with obstruction and  hydronephrosis.  Patient has not passed a stone.  She has severe pain  associated with nausea.  After counseling, she is brought to the short stay  center today for cystoscopy, left retrograde pyelogram, and left ureteral  stent placement.   Patient denied having any fever, chills, voiding difficulty or gross  hematuria at present.   PAST MEDICAL HISTORY:  Recurrent urolithiasis status post multiple ESWL's,  status post left percutaneous nephrostolithotomy, status post ureteroscopy  stone extractions, history of depression.   MEDICATIONS:  1.  Serzone 200 mg one p.o. b.i.d.  2.  Wellbutrin 150 mg one p.o. b.i.d.  3.  Citra-K one teaspoonful p.o. b.i.d.  4.  Vicodin 5/500 one  p.o. q.4-6h. p.r.n. pain.  5.  Phenergan 25 mg one p.o. q.8h. p.r.n. nausea.   ALLERGIES:  SULFA.   EXAMINATION:  HEAD/EYES/EARS/NOSE AND THROAT:  Normal.  NECK:  No masses.  LUNGS:  Clear to auscultation.  HEART:  Regular rate and rhythm.  No murmurs.  ABDOMEN:  Soft.  No palpable flank mass.  Moderate left costovertebral angle  tenderness is noted.  Bladder not palpable.  No suprapubic tenderness.   IMPRESSION:  Left renal calculus with obstruction, left hydronephrosis, left  renal colic, bilateral renal calculi.   PLAN:  1.  Cystoscopy, left retrograde pyelogram, left ureteral stent placement      under anesthesia in short stay center.  I have discussed with the      patient and her mother regarding the diagnosis, operative details,      outcome, alternate treatments, possible risks and complications and she      has agreed for the procedure to be done.  2.  Obstructing left renal calculus will later be treated with ESWL as an      outpatient.  Pennie Rushing   SK/MEDQ  D:  06/18/2004  T:  06/18/2004  Job:  [id number]

## 2010-10-31 NOTE — H&P (Signed)
Bethany King, Bethany King             ACCOUNT NO.:  [id number]   MEDICAL RECORD NO.:  [id number]          PATIENT TYPE:  AMB   LOCATION:  DAY                           FACILITY:  [facility]   PHYSICIAN:  Dennie Maizes, M.D.   DATE OF BIRTH:  [birth year]   DATE OF ADMISSION:  07/02/2004  DATE OF DISCHARGE:  LH                                HISTORY & PHYSICAL   CHIEF COMPLAINT:  Severe left flank pain, left upper ureteral calculus with  obstruction, status post left ureteral stent placement.   HISTORY OF PRESENT ILLNESS:  This 40 year old female has a history of  recurrent urolithiasis.  She has recurrent stone disease associated with  medullary sponge kidneys.  She has had numerous stones in the past.  She has  had multiple procedures for stone disease which include ESL, ureteroscopy  stone extraction, as well as percutaneous nephrolithotomy.   She experienced intermittent severe low flank pain radiating to the front  for three days.  She came tot he emergency room at [facility].  She  has severe nausea and vomiting.  A noncontrast CT of the abdomen and pelvis  revealed bilateral small renal calculi.  There is a 9 mm size left renal  calculus at the ureteropelvic junction without obstruction, and  hydronephrosis.  The patient underwent cystoscopy, left retrograde pyelogram  and left ureteral stent placement on 06/26/04.  She has good pain relief at  present.  There is no history of fever, chills, voiding difficulty or  hematuria at present.  She is brought to the short-stay center today for  lithotripsy of the left upper ureteral calculus.   PAST MEDICAL HISTORY:  Recurrent ureterolithiasis, status post multiple ESL,  status post left percutaneous nephrostolithotomy, status post ureteroscopy  stone extraction, history of depression.   MEDICATIONS:  1.  Serzone 200 mg one p.o. b.i.d.  2.  Wellbutrin 150 mg one p.o. b.i.d.  3.  Citra-K  one teaspoon b.i.d.  4.  Vicodin 5/500, one  p.o. q.4-6h. p.r.n. pain.  5.  Cipro 500 mg, one p.o. b.i.d.   ALLERGIES:  SULFA.   PHYSICAL EXAMINATION:  HEENT:  Head, eyes, ears, nose and throat normal.  NECK:  No masses.  LUNGS:  Clear to auscultation.  HEART:  Regular rate and rhythm, no murmurs.  ABDOMEN:  Soft, no palpable flank mass.  No costovertebral angle tenderness.  Bladder not palpable.  No suprapubic tenderness.   IMPRESSION:  Left upper ureteral calculus with obstruction, left renal  colic, status post left ureteral stent placement, bilateral small renal  calculi.   PLAN:  ESL of the left upper ureteral calculus with IV sedation in Day  [facility]ospital.  I have discussed with the patient regarding the diagnosis,  operative details, alternative treatments, outcome, possible risks and  complications, and she is agreeable for the procedure to be done.     Pennie Rushing   SK/MEDQ  D:  07/01/2004  T:  07/01/2004  Job:  [id number]   cc:   [facility]  Fax: [phone]

## 2010-10-31 NOTE — H&P (Signed)
[facility]  Patient:    Bethany King, Bethany King Visit Number: [id number] MRN: [id number]          Service Type: OUT Location: RAD Attending Physician:  Dennie Maizes Dictated by:   Dennie Maizes, M.D. Admit Date:  12/19/2001 Discharge Date: 12/19/2001                           History and Physical  CHIEF COMPLAINT:  Severe left flank pain, nausea.  HISTORY OF PRESENT ILLNESS:  This 40 year old female has a history of recurrent urolithiasis.  She has undergone multiple procedures for kidney stones.  She has undergone multiple lithotripsies as well as ureteroscopies, stone extractions.  She has ______ stones in the lower pole of the left kidney.  She has a history of ______ kidney.  The last lithotripsy was done in February 2003.  She was seen in the office recently with severe left flank pain radiating to the front and intermittent nausea.  X-ray of the KUB area revealed three large stones in the left kidney measuring 15 mm, 10 mm, and 12 mm in size.  She also had two ______ stones in the lower pole region of the left kidney. Patient has been treated with Percocet and Phenergan and she is brought to the day hospital today for extracorporeal shock wave lithotripsy of the obstructing left renal calculus.  She did not have any fever, gross hematuria, or ______  PAST MEDICAL HISTORY:  History of recurrent urolithiasis, status post multiple ESWLs, status post ureteroscopic stone extraction, history of depression.  MEDICATIONS:  Paxil, Percocet, and Phenergan.  ALLERGIES:  SULFA.  PHYSICAL EXAMINATION:  GENERAL:  The patient was found to be in moderate pain.  HEAD, EYES, EARS, NOSE, and THROAT:  Normal.  NECK:  No masses.  LUNGS:  Clear to auscultation.  HEART:  Regular rate and rhythm, no murmurs.  ABDOMEN:  Soft.  No palpable flank mass.  Moderate left costovertebral angle tenderness as noted.  Bladder not palpable.  PELVIC:  Examination  not done.  IMPRESSION:  Left renal calculi with obstruction, left renal colic.  PLAN:  Extracorporeal shock wave lithotripsy of the left renal calculus with IV sedation in day hospital.  The patient has three large stones in the left kidney which may require more than one session of treatment.  I have explained to the patient regarding the diagnosis, operative details, alternate treatment, outcome, possible risks and complications, and she has agreed for the procedure to be done. Dictated by:   Dennie Maizes, M.D. Attending Physician:  Dennie Maizes DD:  12/20/01 TD:  12/20/01 Job: [id number]

## 2010-10-31 NOTE — Op Note (Signed)
Bethany King, Bethany King             ACCOUNT NO.:  [id number]   MEDICAL RECORD NO.:  [id number]          PATIENT TYPE:  AMB   LOCATION:  DAY                           FACILITY:  [facility]   PHYSICIAN:  Dennie Maizes, M.D.   DATE OF BIRTH:  May 18, 1971   DATE OF PROCEDURE:  06/19/2004  DATE OF DISCHARGE:                                 OPERATIVE REPORT   PREOPERATIVE DIAGNOSES:  1.  Left upper ureteral calculus with obstruction.  2.  Left hydronephrosis.  3.  Left renal colic.  4.  Bilateral small renal calculi.   POSTOPERATIVE DIAGNOSES:  1.  Left upper ureteral calculus with obstruction.  2.  Left hydronephrosis.  3.  Left renal colic.  4.  Bilateral small renal calculi.   OPERATIVE PROCEDURES:  1.  Cystoscopy.  2.  Left retrograde pyelogram.  3.  Left ureteral stent placement.   ANESTHESIA:  General.   SURGEON:  Dennie Maizes, M.D.   COMPLICATIONS:  None.   INDICATIONS FOR THE PROCEDURE:  This 40 year old female has a history of  recurrent ureterolithiasis.  She was evaluated for left flank pain.  She had  an 8 x 9 mm size left ureteral calculus with obstruction.  She was taken to  the OR today for cystoscopy, left retrograde pyelogram and left ureteral  stent placement.   DESCRIPTION OF PROCEDURE:  General anesthesia was induced and the patient  was placed on the OR tablet in the dorsolithotomy position.  The lower  abdomen and genitalia were prepped and draped in a sterile fashion.  Cystoscopy was done with the 25 [place] scope.  The appearance of the bladder  was normal.  The trigone, ureteral orifices and bladder mucosa were  unremarkable.  A 5 French Burch catheter was then placed in the left  ureteral orifice.  About 7 mL of Renografin-60 was injected into the  collecting system and a retrograde pyelogram was done.  The distal ureter  was normal.  There was a large filling defect in the left upper ureter,  about 8 x 9 mm in size.  There was proximal  hydronephrosis.   A 5 French open ended catheter was then placed in the left distal ureter.  A  0.038 inch Benson guide wire with a flexible tip was then advanced into the  left renal pelvis without any difficulty.  The open ended catheter was then  removed.  A 6 French 26 cm size 10 was then placed over the guide wire and  pushed into the left collecting system.  The instruments were removed.  The  patient was transferred to the PACU in satisfactory condition.     Pennie Rushing   SK/MEDQ  D:  06/19/2004  T:  06/19/2004  Job:  [id number]

## 2010-10-31 NOTE — H&P (Signed)
[facility]  Patient:    Bethany King, Bethany King Visit Number: [id number] MRN: [id number]          Service Type: OUT Location: RAD Attending Physician:  Dennie Maizes Dictated by:   Dennie Maizes, M.D. Admit Date:  07/05/2001 Discharge Date: 07/05/2001                           History and Physical  CHIEF COMPLAINT:  Left flank pain, left renal calculi.  HISTORY OF PRESENT ILLNESS:  This 40 year old female has a history of recurrent urolithiasis.  She has undergone multiple procedures for kidney stones.  She has undergone multiple lithotripsies as well as ureteroscopy stone extraction.  She has clusters of stones in the lower pole of the left kidney suggestive of medullary sponge kidney.  She complains of intermittent left flank pain and suprapubic discomfort.  This was associated with mild dysuria.  Urine culture and sensitivity revealed no growth at 48 hours.  Recent x-ray of the area revealed multiple left renal calculi.  There is a 15 x 10 mm size calculus in the left renal pelvis and multiple clusters of stones in the in the lower pole of the left kidney suggestive of medullary sponge kidney.  The stone in the left renal pelvis has been increasing in size and is 15 x 10 mm at present.  The patient is brought to the day hospital today for lithotripsy of the large left renal calculus.  PAST MEDICAL HISTORY: 1. History of recurrent urolithiasis status post multiple ESL, status post    ureteroscopy stone extraction. 2. History of depression.  MEDICATIONS:  Paxil and Percocet.  ALLERGIES:  SULFA.  PHYSICAL EXAMINATION:  HEENT:  Normal.  NECK:  No masses.  LUNGS:  Clear to auscultation.  HEART:  Regular rate and rhythm with no murmurs.  ABDOMEN:  Soft.  No palpable flank mass.  Mild costovertebral angle tenderness was noted.  Bladder not palpable.  IMPRESSION:  Left renal colic, left renal calculi.  PLAN:  ESL left renal calculus with  IV sedation in day hospital.  I have discussed with the patient regarding the diagnosis, operative details, alternative treatments, outcome, possible risks and complications, and she has agreed for the procedure to be done. Dictated by:   Dennie Maizes, M.D. Attending Physician:  Dennie Maizes DD:  08/02/01 TD:  08/02/01 Job: [id number]

## 2010-11-04 NOTE — Discharge Summary (Signed)
NAMETAMYKA, LUBOLD             ACCOUNT NO.:  [id number]  MEDICAL RECORD NO.:  [id number]           PATIENT TYPE:  I  LOCATION:  A202                          FACILITY:  [facility]  PHYSICIAN:  Hillis Mcphatter L. Lendell Caprice, MDDATE OF BIRTH:  1970/12/11  DATE OF ADMISSION:  10/19/2010 DATE OF DISCHARGE:  05/09/2012LH                              DISCHARGE SUMMARY   DISCHARGE DIAGNOSES: 1. Colitis. 2. Hematochezia secondary to colitis versus hemorrhoids. 3. Recent polypectomy. 4. Depression. 5. Irritable bowel syndrome. 6. History of gastritis with gastric polyps. 7. Hypertension. 8. Hypokalemia. 9. Acute blood loss anemia. 10.Hepatic hemangiomas. 11.History of kidney stones. 12.Abnormal TSH, needs outpatient followup.  DISCHARGE MEDICATIONS: 1. Ciprofloxacin 500 mg p.o. b.i.d. 2. Flagyl 500 mg p.o. t.i.d. to complete a 10-day course. 3. Flora-Q or other probiotic daily. 4. Nexium 40 mg a day. 5. Vicodin 5/325 one to two p.o. q.4 h. p.r.n. pain. 6. Promethazine 12.5 mg p.o. q.6 h. p.r.n. nausea. 7. Anusol-HC Suppository per rectum b.i.d. for 4 more days. 8. Hold hydrochlorothiazide. 9. Hold multivitamin. 10.Continue Viibryd 40 mg a day. 11.Stop ibuprofen. 12.Artificial tears daily as needed to dry eyes. 13.Hyoscyamine 0.125 mg p.o. t.i.d. p.r.n. stomach cramps.  CONDITION:  Stable.  ACTIVITY:  Ad lib.  DIET:  Should be low-residue nondairy for a few weeks.  FOLLOWUP:  Dr. Simone Curia in a few months to repeat thyroid function tests.  Stool cultures today are negative, but final culture results will be followed up by GI.  CONSULTATIONS:  Dr. Darrick Penna.  PROCEDURES:  None.  LABORATORY DATA:  CBC on admission normal.  Hemoglobin on admission was 12, at discharge hemoglobin is 9.7.  Complete metabolic panel on admission significant for a potassium of 3.3, which dropped to a low of 3.1, and at discharge is 3.6.  TSH is 10, free T4 is normal at 1.22, free T3 is pending.   Lipase normal.  Urine pregnancy negative. Urinalysis showed trace blood, otherwise negative.  C. diff negative. Stool cultures to date are negative.  Urine culture negative.  DIAGNOSTICS:  CT of the abdomen and pelvis showed ascending and transverse colonic wall thickening and pericolonic stranding consistent with colitis.  Multiple bilateral intrarenal calculi, multiple hepatic hemangiomas.  HISTORY AND HOSPITAL COURSE:  Please see H and P for details.  Ms. Dancey is a pleasant 40 year old white female with a recent EGD, colonoscopy, and polypectomy who presented with sudden onset abdominal pain, bloating, cramping, bloody diarrhea, nausea.  Her abdomen had some tenderness.  It was soft.  The patient was admitted to the hospitalist service and started on empiric antibiotics.  GI was consulted and felt that her bleeding was related to the colitis or possibly the hemorrhoids.  Her pain improved.  Her nausea improved.  She is tolerating a solid diet.  Her stools are no longer bloody and are becoming more formed.  Her hemoglobin did drop a few points with hydration and the resolved bleeding.  Her potassium was repleted.  Dr. Sherrie Mustache ordered a TSH, which was abnormal.  The patient reports that she usually gets a TSH yearly and that most recently, it was normal in the office.  She will need this repeated in a few months.  Her other medical problems remained stable during the hospitalization.  TOTAL TIME ON THE DAY OF DISCHARGE:  Greater than 30 minutes.     Alieah Brinton L. Lendell Caprice, MD     CLS/MEDQ  D:  10/22/2010  T:  10/23/2010  Job:  [id number]  cc:   Donna Bernard, M.D. Fax: [phone]  Electronically Signed by Crista Curb MD on 11/04/2010 11:00:02 AM

## 2010-11-11 NOTE — Consult Note (Signed)
[name], [name]             ACCOUNT NO.:  [id number]  MEDICAL RECORD NO.:  [id number]           PATIENT TYPE:  I  LOCATION:  IC02                          FACILITY:  [facility]  PHYSICIAN:  Bethany King, M.D.     DATE OF BIRTH:  [birth year]  DATE OF CONSULTATION:  10/20/2010 DATE OF DISCHARGE:                                CONSULTATION   Requesting physician is the [facility] [place].  REASON FOR CONSULTATION:  Colitis status post polypectomy.  PRIMARY CARE PHYSICIAN:  Bethany Bernard, MD  HISTORY OF PRESENT ILLNESS:  Bethany King is a 40 year old Caucasian female.  She was recently evaluated in our office on September 24, 2010, for abdominal pain and rectal bleeding and was set up for colonoscopy and EGD with Dr. Jonette King.  She had a colonoscopy and EGD on October 13, 2010.  She had a 4-mm sessile polyp removed from her sigmoid colon, which was an adenoma.  She had a single ascending colon diverticula, small internal hemorrhoids, normal esophagus, and non H. pylori gastritis.  She tells me she continued to have loose stools after her prep all week.  She was having 4-5 watery stools per day.  Yesterday morning, she awakened at 5:30 a.m. with lower abdominal cramps, which "doubled over."  She rated the pain 10/10 on pain scale.  Pain was constant in bilateral lower quadrant, radiates around to her left lower quadrant and left flank.  Yesterday she had 4 bowel movements and then she developed a large amount of bright red blood, which she describes was "like a period."  She has had some nausea, but denies any vomiting. Denies any fever.  Has had some chills.  She has had some indigestion. Denies any heartburn.  Denies any NSAID or aspirin use.  She has been started on Cipro and Flagyl since hospitalization.  She denies any recent ill contacts.  Her pain is currently 8/10.  Her hemoglobin was 12 upon admission yesterday.  It is 11.9 today.  She has had a C.  diff which was negative by PCR and stool culture pending.  She had a CT of abdomen and pelvis yesterday with IV contrast, which showed stable hepatic hemangiomas, ascending colon thickening with pericolonic stranding on the mid transverse colon and numerous bilateral intrarenal calculi and a small right renal cyst.  PAST MEDICAL AND SURGICAL HISTORY: 1. Colonoscopy October 13, 2010, by Dr. Darrick King:  Small internal     hemorrhoids, single ascending colon diverticula, 4-mm tubular     adenoma removed from the sigmoid colon. 2. Chronic GERD with EGD October 13, 2010, by Dr. Darrick King.  Normal     esophagus, non H. pylori gastritis. 3. IBS. 4. Hypertension. 5. Anxiety. 6. Depression. 7. Hepatic hemangiomas. 8. History of renal lithiasis.  PAST SURGICAL HISTORY:  Carpal tunnel release in October 2011 and December 2011, numerous kidney stone surgeries and right foot surgery.  MEDICATIONS PRIOR TO ADMISSION: 1. Nexium 40 mg daily. 2. Hydrochlorothiazide 12.5 mg daily. 3. Hyoscyamine 2-3 times daily p.r.n. 4. Viibryd 40 mg daily.  ALLERGIES:  TRAMADOL and SULFA causes rash.  FAMILY HISTORY:  She has a cousin with colon cancer.  Mom had IBS and scleroderma.  Her father had melanoma.  SOCIAL HISTORY:  Bethany King is divorced.  She lives alone.  She works at [employer].  She denies any tobacco, alcohol, or drug use.  REVIEW OF SYSTEMS:  See HPI.  Otherwise review of systems is negative. GYN:  Her LMP was October 08, 2010, and she has regular menstrual cycles.  PHYSICAL EXAMINATION:  VITAL SIGNS:  Temperature 98.2, pulse 68, respirations 14, blood pressure 143/77, O2 sat 98% on room air, weight 79.3 kg, height 63 inches. GENERAL:  She is a well-developed, well-nourished Caucasian female in no acute distress. HEENT:  Sclerae clear and nonicteric.  Conjunctivae pink.  Oropharynx pink and moist without lesions. NECK:  Supple without mass or thyromegaly. CHEST/HEART:  Regular rate and rhythm.   Normal S1 and S2 without murmurs, clicks, rubs, or gallops. LUNGS:  Clear to auscultation bilaterally. ABDOMEN:  Positive bowel sounds x4.  No bruits auscultated.  Abdomen is distended.  She does have tenderness to her entire abdomen on palpation, however, there is no rebound tenderness or guarding.  No hepatosplenomegaly or masses. EXTREMITIES:  Without clubbing or edema. SKIN:  Warm and dry without rash or jaundice.  LABORATORY STUDIES:  Hemoglobin 11.9, hematocrit 37.4, white blood cell 8.4, platelets 287.  Calcium 7.9.  Sodium 140, potassium 3.3, chloride 105, CO2 of 30, BUN 10, creatinine 0.69, and glucose 121.  Magnesium 2. Urinalysis shows trace blood and few SE.  IMPRESSION:  Bethany King is a 40 year old Caucasian female who is 1 week status post colonoscopy and EGD with polypectomy.  She presents with persistent abdominal pain, hematochezia, and colitis on CT.  Her hemoglobin is stable.  I doubt significant post polypectomy bleed at this point.  I suspect acute colitis secondary to infection versus ischemia.  She had a negative C. diff for PCR.  She also had a single tubular adenoma removed for sigmoid colon and non H. pylori gastritis on EGD with a benign gastric polypectomy.  PLAN: 1. Agree with IV fluids, pain control, and antiemetics. 2. Follow H and H and monitor for signs of post polypectomy bleed. 3. Followup stool culture. 4. May advance to clear liquid diet. 5. Complete course of Cipro and Flagyl for 5-7 days pending her     course. 6. Agree with daily PPI. 7. Add Anusol-HC suppositories for 10 days for hemorrhoids found on     colonoscopy. 8. Timing of next colonoscopy to be determined by Dr. Jonette King. 9. Begin daily probiotics in the way of Flora-Q once daily.  Thank you for participating in the care of Bethany King.     Bethany King, N.P.   ______________________________ Bethany King, M.D.    KJ/MEDQ  D:  10/20/2010  T:  10/20/2010  Job:   [id number]  cc:   Bethany King, M.D. Fax: [phone]  Electronically Signed by Bethany King N.P. on 11/04/2010 09:04:39 AM Electronically Signed by Bethany King M.D. on 11/11/2010 02:40:07 PM

## 2010-11-11 NOTE — Op Note (Signed)
Bethany King, Bethany King             ACCOUNT NO.:  [id number]  MEDICAL RECORD NO.:  [id number]           PATIENT TYPE:  O  LOCATION:  [facility]                          FACILITY:  [facility]  PHYSICIAN:  Jonette Eva, M.D.     DATE OF BIRTH:  05-25-1971  DATE OF PROCEDURE:  10/13/2010 DATE OF DISCHARGE:                               PROCEDURE NOTE   REFERRING PROVIDER:  Donna Bernard, MD.  PROCEDURES: 1. Colonoscopy with cold forceps polypectomy. 2. Esophagogastroduodenoscopy with cold forceps biopsy.  INDICATION FOR EXAM:  Mr. Blankinship is a 40 year old female, who presents with rectal bleeding and abdominal pain.  FINDINGS: 1. Small internal hemorrhoids.  Otherwise, no masses, inflammatory     changes, or AVM seen. 2. Single ascending colon diverticulum noted (superficial). 3. 4-mm sessile sigmoid colon polyp removed via cold forceps. 4. Normal esophagus without evidence of Barrett's mass, erosion,     ulceration, or stricture. 5. 2-3 or 3-4-mm sessile gastric polyp seen in the proximal stomach.     Biopsies obtained via cold forceps. 6. Streaky erythema without erosion or ulceration seen in the body and     the antrum.  Biopsies obtained via cold forceps to evaluate for H.     pylori gastritis. 7. Normal duodenal bulb, ampulla, and second portion of the duodenum.  DIAGNOSES: 1. Epigastric pain most likely secondary to gastroesophageal reflux     disease.  The differential diagnosis includes irritable bowel     syndrome or non-ulcer dyspepsia. 2. Rectal bleeding secondary to internal hemorrhoids.  RECOMMENDATIONS: 1. The patient should continue Nexium. 2. Follow up in 3 months regarding her abdominal pain.  She has had     symptomatic improvement with Nexium.  She is using HyoMax p.r.n. 3. We will call with her biopsies. 4. No aspirin or NSAIDs for 3 days. 5. She should follow a high-fiber diet.  She is given a handout on     high-fiber diet and gastritis. 6. Screening  colonoscopy in 10 years.  MEDICATIONS: 1. Demerol 100 mg IV. 2. Versed 5 mg IV. 3. Phenergan 25 mg IV.  PROCEDURE TECHNIQUE:  Physical exam was performed.  Informed consent was obtained from the patient after explaining benefits, risks and alternatives to the procedure.  The patient was connected to the monitor and placed in left lateral position.  Continuous oxygen was provided by nasal cannula and IV medicine administered through an indwelling cannula.  After administration of sedation and rectal exam, the patient's rectum was intubated and the scope was advanced under direct visualization to the cecum.  The scope was removed slowly by carefully examining the color, texture, anatomy, and integrity of the mucosa on the way out.  The patient was recovered in endoscopy and discharged to home in satisfactory condition.  PATH: SIMPLE ADENOMA, MILD GASTRITIS-TCS in 10 years. HIGH FIBER DIET. Continue Nexium. Discussed with pt while she was hospitalized:5/6-03/2011.  Jonette Eva, M.D.     SF/MEDQ  D:  10/13/2010  T:  10/13/2010  Job:  [id number]  cc:   Donna Bernard, M.D. Fax: [phone]  Electronically Signed by Jonette Eva M.D. on 11/11/2010 02:39:23  PM

## 2010-11-12 ENCOUNTER — Observation Stay (HOSPITAL_COMMUNITY)
Admission: EM | Admit: 2010-11-12 | Discharge: 2010-11-13 | Disposition: A | Discharge status: Home / Self Care | Payer: 59 | Attending: Internal Medicine | Admitting: Internal Medicine

## 2010-11-12 ENCOUNTER — Emergency Department (HOSPITAL_COMMUNITY): Payer: 59

## 2010-11-12 DIAGNOSIS — Z79899 Other long term (current) drug therapy: Secondary | ICD-10-CM | POA: Insufficient documentation

## 2010-11-12 DIAGNOSIS — K219 Gastro-esophageal reflux disease without esophagitis: Secondary | ICD-10-CM | POA: Insufficient documentation

## 2010-11-12 DIAGNOSIS — F329 Major depressive disorder, single episode, unspecified: Secondary | ICD-10-CM | POA: Insufficient documentation

## 2010-11-12 DIAGNOSIS — F3289 Other specified depressive episodes: Secondary | ICD-10-CM | POA: Insufficient documentation

## 2010-11-12 DIAGNOSIS — I059 Rheumatic mitral valve disease, unspecified: Secondary | ICD-10-CM

## 2010-11-12 DIAGNOSIS — F411 Generalized anxiety disorder: Secondary | ICD-10-CM | POA: Insufficient documentation

## 2010-11-12 DIAGNOSIS — R0602 Shortness of breath: Secondary | ICD-10-CM | POA: Insufficient documentation

## 2010-11-12 DIAGNOSIS — I10 Essential (primary) hypertension: Secondary | ICD-10-CM | POA: Insufficient documentation

## 2010-11-12 DIAGNOSIS — R0789 Other chest pain: Principal | ICD-10-CM | POA: Insufficient documentation

## 2010-11-12 LAB — DIFFERENTIAL
Basophils Absolute: 0.1 10*3/uL (ref 0.0–0.1)
Basophils Relative: 1 % (ref 0–1)
Eosinophils Relative: 1 % (ref 0–5)
Monocytes Absolute: 0.9 10*3/uL (ref 0.1–1.0)

## 2010-11-12 LAB — BASIC METABOLIC PANEL
CO2: 30 mEq/L (ref 19–32)
Calcium: 9.9 mg/dL (ref 8.4–10.5)
Chloride: 100 mEq/L (ref 96–112)
Glucose, Bld: 83 mg/dL (ref 70–99)
Sodium: 140 mEq/L (ref 135–145)

## 2010-11-12 LAB — CARDIAC PANEL(CRET KIN+CKTOT+MB+TROPI)
CK, MB: 2.2 ng/mL (ref 0.3–4.0)
Relative Index: 1.5 (ref 0.0–2.5)
Relative Index: 1.6 (ref 0.0–2.5)
Troponin I: <0.3 ng/mL (ref <0.30)
Troponin I: <0.3 ng/mL (ref <0.30)

## 2010-11-12 LAB — CBC
MCH: 28.3 pg (ref 26.0–34.0)
MCHC: 31.8 g/dL (ref 30.0–36.0)
RDW: 13.8 % (ref 11.5–15.5)

## 2010-11-12 LAB — CK TOTAL AND CKMB (NOT AT ARMC)
CK, MB: 2.6 ng/mL (ref 0.3–4.0)
Total CK: 193 U/L — ABNORMAL HIGH (ref 7–177)

## 2010-11-12 MED ORDER — IOHEXOL 350 MG/ML SOLN
100.0000 mL | Freq: Once | INTRAVENOUS | Status: AC | PRN
  Administered 2010-11-12: 100 mL via INTRAVENOUS

## 2010-11-12 NOTE — H&P (Signed)
Bethany King, CHINO             ACCOUNT NO.:  [id number]  MEDICAL RECORD NO.:  [id number]           PATIENT TYPE:  E  LOCATION:  [facility]                          FACILITY:  [facility]  PHYSICIAN:  Wilson Singer, M.D.DATE OF BIRTH:  02-01-1971  DATE OF ADMISSION:  11/12/2010 DATE OF DISCHARGE:  LH                             HISTORY & PHYSICAL   CHIEF COMPLAINT:  Chest pain.  HISTORY OF PRESENT ILLNESS:  This 40 year old lady gives a 4- to 5-day history of central chest pain which is actually sharp in nature, it is constant, and has got to the point where it has been so severe that she sweats with it and gets short of breath.  She also describes that when she lies flat, the pain is worse.  It is associated with more shortness of breath as opposed to when she stands up.  The pain is not particularly affected by respiration.  She has not had any dizziness or lightheadedness or any syncopal episodes.  She does not have any history of coronary artery disease.  She does have a history of hypertension. When she presented to the emergency room, she has been evaluated and, despite receiving intravenous morphine, she still continues to have the pain.  PAST MEDICAL HISTORY:  Significant for recent admission with colitis which has now resolved.  History of left-sided kidney stones, which involved lithotripsy and surgery, history of hypertension, history of anxiety and depression.  ALLERGIES:  DILAUDID, SULFA, and ULTRAM  MEDICATIONS: 1. Xanax 0.5 mg  nightly. 2. Nexium 40 mg daily. 3. Hydrochlorothiazide 12.5 mg daily. 4. Viibryd 40 mg daily.  SOCIAL HISTORY:  Patient is single and has a boyfriend.  She lives apparently alone.  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  She does not smoke, does not drink alcohol.  PAST SURGICAL HISTORY:  Bilateral carpal tunnel surgery in October and December 2011, left kidney stone surgery  2010, right plantar fasciotomy in 2010.  FAMILY HISTORY:  There is no family history of early coronary artery disease.  REVIEW OF SYSTEMS:  Apart from the symptoms mentioned above, there are no other symptoms referable to all systems reviewed.  PHYSICAL EXAMINATION:  VITAL SIGNS:  Temperature 98.7, blood pressure 127/52, pulse 70, saturation 100% on room air, respiratory rate 12-14. GENERAL:  She looks systemically well.  There is no clubbing, cyanosis, jaundice, clinical anemia. CARDIOVASCULAR:  Heart sounds are present and normal without any pericardial rub.  In fact, there is no pericardial rub on movement of her position.  There is no gallop rhythm.  Jugular venous pressure is not raised. RESPIRATORY:  Lung fields are clear without any pleural rub.  There is no crackles or wheezes. MUSCULOSKELETAL:  There is chest wall tenderness in the lower anterior chest which is exactly the site of her pain.  The pain by the way did not radiate.  This reproduces her pain.  The pain she did describe if you go back to the history as going through to her back. ABDOMEN:  Soft and nontender with no evidence of hepatosplenomegaly.  There are no masses.  There is no tenderness. NEUROLOGICAL:  She is alert and oriented without any focal neurological signs. SKIN:  There are no obvious skin lesions or rashes.  INVESTIGATIONS:  Electrocardiogram shows normal sinus rhythm with nonspecific T-wave changes laterally.  There is no acute ST elevation. Hemoglobin 11.4, white blood cell count 9.6, platelets 308, troponin less than 0.3.  Sodium 140, potassium 3.0, bicarbonate 30, glucose 83, BUN 18, creatinine 0.84, D-dimer 0.22 in the normal range.  Chest x-ray with no acute cardiopulmonary process.  PROBLEM LIST: 1. Chest pain, possibly musculoskeletal versus pericarditis. 2. Hypertension. 3. Gastroesophageal reflux disease. 4. Anxiety and depression.  PLAN: 1. Admit to telemetry. 2. Serial cardiac  enzymes. 3. Echocardiogram to see if there is any evidence of pericarditis and     also CT chest angiogram to make sure in view of a history of chest     pain radiating to the back that we are not dealing with a subtle     dissection of the aorta. 4. Treat with nonsteroidal anti-inflammatory medication for the time     being.  Further recommendations will depend on the patient's hospital progress.     Wilson Singer, M.D.     NCG/MEDQ  D:  11/12/2010  T:  11/12/2010  Job:  [id number]  cc:   Ardyth Gal  Electronically Signed by Lilly Cove M.D. on 11/12/2010 12:36:13 PM

## 2010-11-13 LAB — CBC
MCHC: 31.8 g/dL (ref 30.0–36.0)
Platelets: 264 10*3/uL (ref 150–400)
RDW: 13.9 % (ref 11.5–15.5)
WBC: 6.4 10*3/uL (ref 4.0–10.5)

## 2010-11-13 LAB — DIFFERENTIAL
Basophils Absolute: 0 10*3/uL (ref 0.0–0.1)
Basophils Relative: 1 % (ref 0–1)
Eosinophils Absolute: 0.1 10*3/uL (ref 0.0–0.7)
Eosinophils Relative: 2 % (ref 0–5)
Lymphocytes Relative: 23 % (ref 12–46)
Monocytes Absolute: 0.7 10*3/uL (ref 0.1–1.0)

## 2010-11-13 LAB — COMPREHENSIVE METABOLIC PANEL
ALT: 15 U/L (ref 0–35)
Albumin: 3.5 g/dL (ref 3.5–5.2)
Alkaline Phosphatase: 55 U/L (ref 39–117)
Calcium: 9.7 mg/dL (ref 8.4–10.5)
GFR calc Af Amer: >60 mL/min (ref >60)
Potassium: 3.3 mEq/L — ABNORMAL LOW (ref 3.5–5.1)
Sodium: 138 mEq/L (ref 135–145)
Total Protein: 6.3 g/dL (ref 6.0–8.3)

## 2010-11-13 LAB — CARDIAC PANEL(CRET KIN+CKTOT+MB+TROPI)
CK, MB: 1.7 ng/mL (ref 0.3–4.0)
Relative Index: 1.7 (ref 0.0–2.5)
Relative Index: INVALID (ref 0.0–2.5)
Total CK: 94 U/L (ref 7–177)
Troponin I: <0.3 ng/mL (ref <0.30)

## 2010-11-13 NOTE — Discharge Summary (Signed)
Bethany King, Bethany King             ACCOUNT NO.:  [id number]  MEDICAL RECORD NO.:  [id number]           PATIENT TYPE:  I  LOCATION:  A315                          FACILITY:  [facility]  PHYSICIAN:  Wilson Singer, M.D.DATE OF BIRTH:  1971-03-15  DATE OF ADMISSION:  11/12/2010 DATE OF DISCHARGE:  05/31/2012LH                              DISCHARGE SUMMARY   FINAL DISCHARGE DIAGNOSES: 1. Musculoskeletal chest pain. 2. Hypertension, controlled. 3. Gastroesophageal reflux disease. 4. Anxiety and depression.  CONDITION ON DISCHARGE:  Stable.  MEDICATIONS ON DISCHARGE: 1. Ibuprofen 800 mg t.i.d. with meals. 2. Alprazolam 0.5 mg nightly. 3. Hydrocodone/APAP 5/325 one to two tablets every 6 hours p.r.n. 4. Nexium 40 mg daily. 5. Probiotic Colon Health 1 capsule daily. 6. Viibryd 40 mg daily.  HISTORY:  This very pleasant 40 year old lady was admitted yesterday with chest pain.  Please see initial history and physical examination done by Dr. Lilly Cove.  HOSPITAL PROGRESS:  The patient was admitted overnight and the pain was felt really to be musculoskeletal in nature but because the pain was sharp in nature, radiating through to the back, it was felt that other causes should be excluded.  Therefore, she underwent a CT scan of her chest which did not show any evidence of a dissecting aorta.  There was also no evidence of pulmonary embolism.  She also underwent echocardiogram yesterday which showed no evidence of pericarditis and in particular, ejection fraction was in normal range of 55-60%.  There were no regional wall abnormalities.  The serial cardiac enzymes were also performed, and these were all negative and not elevated.  Today, she feels better to some degree with ibuprofen 800 mg three times a day.  PHYSICAL EXAMINATION:  VITAL SIGNS:  Temperature 98.1, blood pressure 122/79, pulse 73, saturation 99% on room air. GENERAL:  She looks systemically well.  She is not in any  acute distress. CARDIAC:  Heart sounds are present and normal without pericardial rub. CHEST:  Lung fields are clear without a pleural rub and there is still some musculoskeletal muscle tenderness in the anterior chest wall reproducing her pain, but it is not as severe as it was yesterday.  INVESTIGATIONS:  This morning show sodium of 138, potassium slightly reduced at 3.3, bicarbonate 31, BUN 17, creatinine 0.66.  Liver enzymes all normal.  Hemoglobin 10.9, white blood cell count 6.4, platelets 264.  DISPOSITION:  The patient is stable to be discharged home now, and she will take ibuprofen three times a day at least for the next 5-7 days.  I have told to make sure she goes and sees the primary care physician to make sure that things are improving.  We will replete her potassium before she is discharged.     Wilson Singer, M.D.     NCG/MEDQ  D:  11/13/2010  T:  11/13/2010  Job:  [id number]  cc:   Donna Bernard, M.D. Fax: [phone]  Electronically Signed by Lilly Cove M.D. on 11/13/2010 01:30:10 PM

## 2011-01-13 ENCOUNTER — Encounter: Payer: Self-pay | Admitting: Gastroenterology

## 2011-02-27 ENCOUNTER — Other Ambulatory Visit: Payer: Self-pay | Admitting: Family Medicine

## 2011-02-27 DIAGNOSIS — R102 Pelvic and perineal pain: Secondary | ICD-10-CM

## 2011-03-02 ENCOUNTER — Ambulatory Visit: Payer: 59 | Admitting: Urgent Care

## 2011-03-02 ENCOUNTER — Telehealth: Payer: Self-pay | Admitting: Urgent Care

## 2011-03-02 NOTE — Telephone Encounter (Signed)
NO SHOW

## 2011-03-05 ENCOUNTER — Ambulatory Visit (HOSPITAL_COMMUNITY): Payer: 59

## 2011-03-09 ENCOUNTER — Other Ambulatory Visit (HOSPITAL_COMMUNITY): Payer: Self-pay | Admitting: Family Medicine

## 2011-03-09 ENCOUNTER — Ambulatory Visit (HOSPITAL_COMMUNITY): Admission: RE | Admit: 2011-03-09 | Payer: 59 | Source: Ambulatory Visit

## 2011-03-09 ENCOUNTER — Ambulatory Visit (HOSPITAL_COMMUNITY): Payer: 59

## 2011-03-09 DIAGNOSIS — R102 Pelvic and perineal pain: Secondary | ICD-10-CM

## 2011-03-16 ENCOUNTER — Ambulatory Visit (HOSPITAL_COMMUNITY): Payer: 59

## 2011-03-23 ENCOUNTER — Ambulatory Visit (HOSPITAL_COMMUNITY): Payer: 59

## 2011-04-29 ENCOUNTER — Encounter: Payer: Self-pay | Admitting: Internal Medicine

## 2011-04-29 ENCOUNTER — Ambulatory Visit (INDEPENDENT_AMBULATORY_CARE_PROVIDER_SITE_OTHER): Payer: 59 | Admitting: Internal Medicine

## 2011-04-29 DIAGNOSIS — R079 Chest pain, unspecified: Secondary | ICD-10-CM

## 2011-04-29 MED ORDER — RABEPRAZOLE SODIUM 20 MG PO TBEC: 20.0000 mg | DELAYED_RELEASE_TABLET | Freq: Every day | ORAL | Status: DC

## 2011-04-29 NOTE — Progress Notes (Signed)
Subjective:    Patient ID: Bethany King, female    DOB: 1971/01/19, 40 y.o.   MRN: [id number]  HPI  50 yowf never smoker with h/o GERD/ibs with new onset cp April 2012 self referred to pulmonary clinic 04/29/2011 for "costochrondritis"  04/29/2011 1st pulmonary acute onset April 2012 midline cp  with overt HB Spring 2012 > GI eval EGD by [facility] with hb not completely eliminated by protonix then w/in a month worsening cp 24 hours a day worse lying down and transiently using recliner. Pain is just left of midline and in LUQ no change with advil which made GI problems worse. ? Worse pain immediately after colonscopy.  Neg wu with ct angiogram of chest and ct abd 5./30/12 and daily pain since then ? Better with flatus, assoc   Sleeping ok without nocturnal  or early am exacerbation  of respiratory  c/o's or need for noct saba. Also denies any obvious fluctuation of symptoms with weather or environmental changes or other aggravating or alleviating factors except as outlined above     Review of Systems  Constitutional: Negative for fever, chills and unexpected weight change.  HENT: Positive for dental problem. Negative for ear pain, nosebleeds, congestion, sore throat, rhinorrhea, sneezing, trouble swallowing, voice change, postnasal drip and sinus pressure.   Eyes: Negative for visual disturbance.  Respiratory: Positive for shortness of breath. Negative for cough and choking.   Cardiovascular: Positive for chest pain. Negative for leg swelling.  Gastrointestinal: Negative for vomiting, abdominal pain and diarrhea.  Genitourinary: Negative for difficulty urinating.  Musculoskeletal: Positive for arthralgias.  Skin: Negative for rash.  Neurological: Negative for tremors, syncope and headaches.  Hematological: Does not bruise/bleed easily.       Objective:   Physical Exam   Wt  178 04/29/2011   Pleasant amb wt nad  HEENT: nl dentition, turbinates, and orophanx. Nl external ear canals  without cough reflex   NECK :  without JVD/Nodes/TM/ nl carotid upstrokes bilaterally   LUNGS: no acc muscle use, clear to A and P bilaterally without cough on insp or exp maneuvers   CV:  RRR  no s3 or murmur or increase in P2, no edema   ABD:  soft and nontender with nl excursion in the supine position. No bruits or organomegaly, bowel sounds nl.  Mild tenderness over splenic flexure  MS:  warm without deformities, calf tenderness, cyanosis or clubbing  SKIN: warm and dry without lesions    NEURO:  alert, approp, no deficits        Assessment & Plan:

## 2011-04-29 NOTE — Patient Instructions (Addendum)
Treatment consists of avoiding foods that cause gas (especially beans and raw vegetables like spinach and salads)  and citrucel 1 heaping tsp twice daily with a large glass of water.  Pain should improve w/in 2 weeks and if not then consider further GI work up (call me for referral)  Stop protonix (which can make this worse) and instead start Aciphex 20 mg Take 30-60 min before first meal of the day and pepcid 20 mg  at bedtime (if can't afford the aciphex because of your insurance it's  ok to substitute pepcid 20mg  take after bfast and at bedtime)  GERD (REFLUX)  is an extremely common cause of respiratory symptoms, many times with no significant heartburn at all.    It can be treated with medication, but also with lifestyle changes including avoidance of late meals, excessive alcohol, smoking cessation, and avoid fatty foods, chocolate, peppermint, colas, red wine, and acidic juices such as orange juice.  NO MINT OR MENTHOL PRODUCTS SO NO COUGH DROPS  USE SUGARLESS CANDY INSTEAD (jolley ranchers or Stover's)  NO OIL BASED VITAMINS - use powdered substitutes.

## 2011-04-30 DIAGNOSIS — R079 Chest pain, unspecified: Secondary | ICD-10-CM | POA: Insufficient documentation

## 2011-04-30 NOTE — Assessment & Plan Note (Signed)
Extended discussion with pt re IBS/ splenic flexure syndrome.  Although symptoms are not classic, given the chronicity and the neg w/u previously this is most certainly a form of IBS.  See instructions for specific recommendations which were reviewed directly with the patient who was given a copy with highlighter outlining the key components.

## 2011-05-11 ENCOUNTER — Telehealth: Payer: Self-pay | Admitting: Internal Medicine

## 2011-05-11 DIAGNOSIS — K219 Gastro-esophageal reflux disease without esophagitis: Secondary | ICD-10-CM

## 2011-05-11 NOTE — Telephone Encounter (Signed)
lmomtcb x1 for pt 

## 2011-05-12 NOTE — Telephone Encounter (Signed)
Called spoke with patient, advised of MW's recs.  Pt okay with this and verbalized her understanding.  Requesting appt with GI be Mon-Thurs 12-2 d/t her work schedule and being unable to take anymore time off.  Referral placed.

## 2011-05-12 NOTE — Telephone Encounter (Signed)
LMTCBx2. Yousif Edelson, CMA  

## 2011-05-12 NOTE — Telephone Encounter (Signed)
Nothing else to offer, refer to GI

## 2011-05-12 NOTE — Telephone Encounter (Signed)
Pt says she is still having stomach pain and has been using Pepcid morning and night. She wants to move forward with the GI referral. Looks like this was the plan at last Ov on 04/29/2011. Is this okay and is there something else the pt can try for the GERD or GI issues in the meantime? Pls advise.

## 2011-05-13 ENCOUNTER — Ambulatory Visit: Payer: 59 | Admitting: Internal Medicine

## 2011-05-18 ENCOUNTER — Encounter: Payer: Self-pay | Admitting: Gastroenterology

## 2011-05-18 ENCOUNTER — Ambulatory Visit (INDEPENDENT_AMBULATORY_CARE_PROVIDER_SITE_OTHER): Payer: 59 | Admitting: Gastroenterology

## 2011-05-18 VITALS — BP 120/80 | HR 72 | Temp 97.4°F | Ht 63.0 in | Wt 174.2 lb

## 2011-05-18 DIAGNOSIS — R1013 Epigastric pain: Secondary | ICD-10-CM

## 2011-05-18 DIAGNOSIS — R197 Diarrhea, unspecified: Secondary | ICD-10-CM

## 2011-05-18 MED ORDER — ESOMEPRAZOLE MAGNESIUM 40 MG PO CPDR: 40.0000 mg | DELAYED_RELEASE_CAPSULE | Freq: Every day | ORAL | Status: DC

## 2011-05-18 MED ORDER — HYOSCYAMINE SULFATE 0.125 MG PO TABS: 0.1250 mg | ORAL_TABLET | ORAL | Status: AC | PRN

## 2011-05-18 NOTE — Progress Notes (Signed)
Referring Provider: Ardyth Gal, MD Primary Care Physician:  Harlow Asa, MD, MD Primary Gastroenterologist: Dr. Darrick Penna   Chief Complaint  Patient presents with  . Abdominal Pain  . Bloated  . Diarrhea    HPI:   Ms. Crownover returns today in f/u after EGD and colonoscopy several months ago. Findings were of mild gastritis, benign gastric polyp, simple adenoma, and internal hemorrhoid. Shortly thereafter, she was hospitalized for colitis, documented by CT. Managed with Cipro and Flagyl, May 2012.  Returns today stating acute onset of abdominal pain, diarrhea, bloating sicne Friday am. Feels dehydrated. Following bland diet. No fever or chills, denies vomiting. +nausea, decreased appetite. Trying to stay hydrated. Feels dizzy, exhausted, muscle cramps. Diarrhea X 5 today. Watery loose stools. Small amount of blood Friday but resolved. Took imodium Saturday, slowed diarrhea down. Afraid to take without talking to [place]. Took Cipro mid-Nov for bladder infection. No sick contacts. Urine clear, oral mucosa moist.  Reports chronic epigastric pain. Nexium helped in past, now on Prilosec X 2 weeks. Insurance will not cover Nexium.    Past Medical History  Diagnosis Date  . GERD (gastroesophageal reflux disease)   . Irritable bowel syndrome (IBS)   . HTN (hypertension)   . Anxiety   . Depression   . Multiple hemangiomas 2006    hepatic, stable  . Costochondritis   . S/P endoscopy April 2012    mild gastritis, benign gastric polyp  . S/P colonoscopy April 2012    internal hemorrhoids, simple adenoma    Past Surgical History  Procedure Date  . Carpel tunnel release 03/2010 and 05/2010    bilateral  . Kidney stone surgery     numerous  . Foot surgery     right    Current Outpatient Prescriptions  Medication Sig Dispense Refill  . ALPRAZolam (XANAX) 0.5 MG tablet Take 0.5 mg by mouth daily.        . hydrochlorothiazide (HYDRODIURIL) 25 MG tablet Take 25 mg by mouth daily.        Marland Kitchen  omeprazole (PRILOSEC) 20 MG capsule Take 20 mg by mouth daily.        . potassium chloride SA (K-DUR,KLOR-CON) 20 MEQ tablet Take 20 mEq by mouth daily.        . Probiotic Product (HEALTHY COLON) CAPS Take 1 capsule by mouth daily.        . Vilazodone HCl (VIIBRYD) 40 MG TABS Take 1 tablet by mouth daily.        . hyoscyamine (LEVSIN, ANASPAZ) 0.125 MG tablet Take 1 tablet (0.125 mg total) by mouth every 4 (four) hours as needed for cramping.  90 tablet  1  . pantoprazole (PROTONIX) 40 MG tablet Take 1 tablet (40 mg total) by mouth daily.  90 tablet  3    Allergies as of 05/18/2011 - Review Complete 05/18/2011  Allergen Reaction Noted  . Hydromorphone  04/29/2011  . Tramadol hcl Other (See Comments)   . Sulfonamide derivatives Rash     Family History  Problem Relation Age of Onset  . Irritable bowel syndrome Mother   . Colon cancer Cousin 26    second cousin Nettie [place])  . Scleroderma Mother   . Melanoma Father   . Rheum arthritis Mother   . Asthma Mother     History   Social History  . Marital Status: Divorced    Spouse Name: N/A    Number of Children: 0  . Years of Education: N/A   Occupational History  .  ORDER PROCESSER [employer]   Social History Main Topics  . Smoking status: Never Smoker   . Smokeless tobacco: Never Used  . Alcohol Use: No  . Drug Use: No  . Sexually Active: No   Other Topics Concern  . None   Social History Narrative  . None    Review of Systems: Gen: Denies fever, chills, anorexia. Denies fatigue, weakness, weight loss.  CV: Denies chest pain, palpitations, syncope, peripheral edema, and claudication. Resp: Denies dyspnea at rest, cough, wheezing, coughing up blood, and pleurisy. GI: SEE HPI Derm: Denies rash, itching, dry skin Psych: Denies depression, anxiety, memory loss, confusion. No homicidal or suicidal ideation.  Heme: Denies bruising, bleeding, and enlarged lymph nodes.  Physical Exam: BP 120/80  Pulse 72   Temp(Src) 97.4 F (36.3 C) (Temporal)  Ht 5\' 3"  (1.6 m)  Wt 174 lb 3.2 oz (79.017 kg)  BMI 30.86 kg/m2  LMP 04/27/2011 General:   Alert and oriented. No distress noted. Pleasant and cooperative.  Head:  Normocephalic and atraumatic. Eyes:  Conjuctiva clear without scleral icterus. Mouth:  Oral mucosa pink and moist. Good dentition. No lesions. Neck:  Supple, without mass or thyromegaly. Heart:  S1, S2 present without murmurs, rubs, or gallops. Regular rate and rhythm. Abdomen:  +BS, soft, mild tenderness to palpation epigastric region.  non-distended. No rebound or guarding. No HSM or masses noted. Msk:  Symmetrical without gross deformities. Normal posture. Extremities:  Without edema. Neurologic:  Alert and  oriented x4;  grossly normal neurologically. Skin:  Intact without significant lesions or rashes. Psych:  Alert and cooperative. Normal mood and affect.

## 2011-05-18 NOTE — Patient Instructions (Signed)
Please complete stool studies. You may take imodium or kaopectate as needed. Continue to stay hydrated.  Please complete labs as well. We will be calling you with these results when available.  Start taking Nexium. Stop Prilosec. Take 30 minutes before breakfast. We have also given you hyoscyamine to help with cramping.   We have provided a letter for work so you may recover. Please return forms to [place] if needed to fill out.

## 2011-05-19 ENCOUNTER — Other Ambulatory Visit: Payer: Self-pay | Admitting: Gastroenterology

## 2011-05-19 ENCOUNTER — Telehealth: Payer: Self-pay | Admitting: Gastroenterology

## 2011-05-19 ENCOUNTER — Encounter: Payer: Self-pay | Admitting: Gastroenterology

## 2011-05-19 DIAGNOSIS — R197 Diarrhea, unspecified: Secondary | ICD-10-CM | POA: Insufficient documentation

## 2011-05-19 LAB — BASIC METABOLIC PANEL
BUN: 9 mg/dL (ref 6–23)
Chloride: 104 mEq/L (ref 96–112)
Glucose, Bld: 91 mg/dL (ref 70–99)
Potassium: 3.4 mEq/L — ABNORMAL LOW (ref 3.5–5.3)
Sodium: 140 mEq/L (ref 135–145)

## 2011-05-19 LAB — CBC WITH DIFFERENTIAL/PLATELET
Eosinophils Absolute: 0.1 10*3/uL (ref 0.0–0.7)
Eosinophils Relative: 1 % (ref 0–5)
HCT: 36.4 % (ref 36.0–46.0)
Lymphocytes Relative: 21 % (ref 12–46)
Lymphs Abs: 1.9 10*3/uL (ref 0.7–4.0)
MCH: 28.3 pg (ref 26.0–34.0)
MCV: 89.4 fL (ref 78.0–100.0)
Monocytes Absolute: 0.6 10*3/uL (ref 0.1–1.0)
Monocytes Relative: 7 % (ref 3–12)
Platelets: 306 10*3/uL (ref 150–400)
RBC: 4.07 MIL/uL (ref 3.87–5.11)

## 2011-05-19 MED ORDER — PANTOPRAZOLE SODIUM 40 MG PO TBEC: 40.0000 mg | DELAYED_RELEASE_TABLET | Freq: Every day | ORAL | Status: DC

## 2011-05-19 MED ORDER — POTASSIUM CHLORIDE 20 MEQ PO PACK: 20.0000 meq | PACK | Freq: Two times a day (BID) | ORAL | Status: DC

## 2011-05-19 NOTE — Progress Notes (Signed)
Cc to PCP 

## 2011-05-19 NOTE — Progress Notes (Signed)
Quick Note:  Mild drop in potassium. Contacted pt, feeling slightly improved. Diarrhea slowed some. Taking imodium. Stool studies submitted this morning, still awaiting results. Will call in dose of potassium X 2. Pt informed. ______

## 2011-05-19 NOTE — Telephone Encounter (Signed)
Will call in Protonix. Please let pt know. Thanks!

## 2011-05-19 NOTE — Telephone Encounter (Signed)
Patient is aware 

## 2011-05-19 NOTE — Telephone Encounter (Signed)
Her insurance doesn't cover Nexium or Aciphex but will cover Protonix if that will help or if its something better she can use that her insurance will cover. She also remembered that she did take an antibiotic Cipro from Nov 9th -15th for bladder infection an wanted me to pass that along. She uses [facility] in [place].

## 2011-05-19 NOTE — Assessment & Plan Note (Signed)
Chronic, mild gastritis on EGD in April 2012. Differentials IBS vs non-ulcer dyspepsia. Nexium not covered by insurance. Prilosec no relief. Will trial Protonix. Consider imipramine per Dr. Darrick Penna.

## 2011-05-19 NOTE — Assessment & Plan Note (Signed)
Acute onset of diarrhea starting Friday, slight improvement with imodium. Took Cipro mid November for bladder infection. No fever or chills. Feels weak, crampy. Urine clear, oral mucosa moist. Lower abdominal pain. Not taking hyoscyamine, which did help in the past. Question of infectious etiology vs IBS. As of note, unsure if checked for celiac in past, will check today with labs.  CBC, BMP, TTG IgA Cdiff PCR, Giardia, Culture Imodium or Kaopectate in interim Stay hydrated, follow bland diet Work note out through Wednesday Further recommendations after labs reviewed

## 2011-05-20 LAB — GIARDIA ANTIGEN: Giardia Screen (EIA): NEGATIVE

## 2011-05-22 NOTE — Progress Notes (Signed)
REVIEWED.  

## 2011-05-25 ENCOUNTER — Encounter: Payer: Self-pay | Admitting: Gastroenterology

## 2011-05-25 ENCOUNTER — Telehealth: Payer: Self-pay

## 2011-05-25 NOTE — Telephone Encounter (Signed)
Pt called and was informed. She said she has appt tomorrow, because she is still having abdominal pain. She said the diarrhea is better now.

## 2011-05-25 NOTE — Telephone Encounter (Signed)
Yes, all stool studies came back normal. Continue Protonix. Continue Imodium as needed. How is pt doing?

## 2011-05-25 NOTE — Telephone Encounter (Signed)
Pt left VM requesting the results of the stool studies. ( They have not been signed off on, but are negative) will send message to Gerrit Halls.  Called pt and left message on machine for a return call.

## 2011-05-25 NOTE — Telephone Encounter (Signed)
Noted  

## 2011-05-26 ENCOUNTER — Encounter: Payer: Self-pay | Admitting: Gastroenterology

## 2011-05-26 ENCOUNTER — Ambulatory Visit (HOSPITAL_COMMUNITY)
Admission: RE | Admit: 2011-05-26 | Discharge: 2011-05-26 | Disposition: A | Discharge status: Home / Self Care | Payer: 59 | Source: Ambulatory Visit | Attending: Gastroenterology | Admitting: Gastroenterology

## 2011-05-26 ENCOUNTER — Ambulatory Visit (INDEPENDENT_AMBULATORY_CARE_PROVIDER_SITE_OTHER): Payer: 59 | Admitting: Gastroenterology

## 2011-05-26 DIAGNOSIS — R1013 Epigastric pain: Secondary | ICD-10-CM

## 2011-05-26 DIAGNOSIS — R079 Chest pain, unspecified: Secondary | ICD-10-CM | POA: Insufficient documentation

## 2011-05-26 DIAGNOSIS — R0781 Pleurodynia: Secondary | ICD-10-CM

## 2011-05-26 DIAGNOSIS — K219 Gastro-esophageal reflux disease without esophagitis: Secondary | ICD-10-CM

## 2011-05-26 DIAGNOSIS — R197 Diarrhea, unspecified: Secondary | ICD-10-CM

## 2011-05-26 NOTE — Patient Instructions (Signed)
We have ordered a chest-xray.   Please increase Protonix to twice a day. Take 30 minutes before breakfast and 30 minutes before supper.  I will be discussing with Dr. Darrick Penna further management and get back with you.

## 2011-05-26 NOTE — Assessment & Plan Note (Addendum)
Continued intermittent epigastric pain, burning. Recent EGD on file. Question non-ulcer dyspepsia. Will increase Protonix to BID as discussed at visit. Consider other agent such as imipramine in future if necessary.

## 2011-05-26 NOTE — Assessment & Plan Note (Addendum)
Resolved. Continue Levbid prn lower abdominal discomfort. Negative stool studies and celiac panel recently. Self-limiting process superimposed on hx of IBS.

## 2011-05-26 NOTE — Progress Notes (Signed)
Referring Provider: Laural Golden* Primary Care Physician:  Harlow Asa, MD, MD Primary Gastroenterologist: Dr. Darrick Penna   Chief Complaint  Patient presents with  . Abdominal Pain    in the center of chest and under breast    HPI:   Ms. Bethany King returns shortly after last being seen for diarrhea and epigastric pain. Diarrhea has since resolved, stool studies negative. Likely acute gastroenteritis, now resolved. She has undergone both an EGD and colonoscopy in April of this year. She complains of chronic epigastric burning,  specifically with eating. This is intermittent. Has tried Prilosec in past, switched to Nexium at last visit but insurance will not cover. Therefore, we switched to Protonix, but she has not started this yet.  Reports continued mid-sternal pain as well as left-sided lower anterior rib pain. Sore to touch. Feels like pressure all the time. Has been told she may have costochondritis in the past.  Saw Dr. Sherene Sires with pulmonology in November, thought symptoms to be related to IBS/GERD. Carafate helped in past. Rib and sternum pain worsened with exertion. Has been out of work since Nov 30th due to strenuous job. Denies any recent injuries.   Levbid helps lower abdominal discomfort.  Past Medical History  Diagnosis Date  . GERD (gastroesophageal reflux disease)   . Irritable bowel syndrome (IBS)   . HTN (hypertension)   . Anxiety   . Depression   . Multiple hemangiomas 2006    hepatic, stable  . Costochondritis   . S/P endoscopy April 2012    mild gastritis, benign gastric polyp  . S/P colonoscopy April 2012    internal hemorrhoids, simple adenoma    Past Surgical History  Procedure Date  . Carpel tunnel release 03/2010 and 05/2010    bilateral  . Kidney stone surgery     numerous  . Foot surgery     right  . Esophagogastroduodenoscopy 10/14/10    Current Outpatient Prescriptions  Medication Sig Dispense Refill  . ALPRAZolam (XANAX) 0.5 MG tablet Take 0.5  mg by mouth daily.        Marland Kitchen esomeprazole (NEXIUM) 40 MG capsule Take 40 mg by mouth daily before breakfast.        . hydrochlorothiazide (HYDRODIURIL) 25 MG tablet Take 25 mg by mouth daily.        . hyoscyamine (LEVSIN, ANASPAZ) 0.125 MG tablet Take 1 tablet (0.125 mg total) by mouth every 4 (four) hours as needed for cramping.  90 tablet  1  . pantoprazole (PROTONIX) 40 MG tablet Take 1 tablet (40 mg total) by mouth daily.  90 tablet  3  . potassium chloride SA (K-DUR,KLOR-CON) 20 MEQ tablet Take 20 mEq by mouth daily.        . Probiotic Product (HEALTHY COLON) CAPS Take 1 capsule by mouth daily.        . Vilazodone HCl (VIIBRYD) 40 MG TABS Take 1 tablet by mouth daily.        Marland Kitchen omeprazole (PRILOSEC) 20 MG capsule Take 20 mg by mouth daily.        . potassium chloride (KLOR-CON) 20 MEQ packet Take 20 mEq by mouth 2 (two) times daily. For one day. Total of 2 doses.  2 packet  0    Allergies as of 05/26/2011 - Review Complete 05/26/2011  Allergen Reaction Noted  . Hydromorphone  04/29/2011  . Tramadol hcl Other (See Comments)   . Sulfonamide derivatives Rash     Family History  Problem Relation Age of Onset  .  Irritable bowel syndrome Mother   . Colon cancer Cousin 89    second cousin Nettie Elm [place])  . Scleroderma Mother   . Melanoma Father   . Rheum arthritis Mother   . Asthma Mother     History   Social History  . Marital Status: Divorced    Spouse Name: N/A    Number of Children: 0  . Years of Education: N/A   Occupational History  . ORDER PROCESSER [employer]   Social History Main Topics  . Smoking status: Never Smoker   . Smokeless tobacco: Never Used  . Alcohol Use: No  . Drug Use: No  . Sexually Active: Yes    Birth Control/ Protection: Condom   Other Topics Concern  . None   Social History Narrative  . None    Review of Systems: Gen: Denies fever, chills, anorexia. Denies fatigue, weakness, weight loss.  CV: Denies chest pain, palpitations,  syncope, peripheral edema, and claudication. Resp: Denies dyspnea at rest, + DOE , cough, wheezing, coughing up blood, and pleurisy. GI: Denies vomiting blood, jaundice, and fecal incontinence.   Denies dysphagia or odynophagia. Derm: Denies rash, itching, dry skin Psych: Denies depression, anxiety, memory loss, confusion. No homicidal or suicidal ideation.  Heme: Denies bruising, bleeding, and enlarged lymph nodes.  Physical Exam: BP 125/71  Pulse 80  Temp(Src) 98.7 F (37.1 C) (Temporal)  Ht 5\' 3"  (1.6 m)  Wt 173 lb 6.4 oz (78.654 kg)  BMI 30.72 kg/m2  LMP 04/26/2011 General:   Alert and oriented. No distress noted. Pleasant and cooperative.  Head:  Normocephalic and atraumatic. Eyes:  Conjuctiva clear without scleral icterus. Mouth:  Oral mucosa pink and moist. Good dentition. No lesions. Heart:  S1, S2 present without murmurs, rubs, or gallops. Regular rate and rhythm. Abdomen:  +BS, soft, TTP mildly epigastric region. and non-distended. No rebound or guarding. No HSM or masses noted. Msk:  Symmetrical without gross deformities. Normal posture. + TTP lower sternum above xiphoid process, TTP intercostal margins to left of mid-sternum, TTP border of lower left ribs, anterior Extremities:  Without edema. Neurologic:  Alert and  oriented x4;  grossly normal neurologically. Skin:  Intact without significant lesions or rashes. Psych:  Alert and cooperative. Normal mood and affect.

## 2011-05-27 ENCOUNTER — Telehealth: Payer: Self-pay | Admitting: Gastroenterology

## 2011-05-27 ENCOUNTER — Other Ambulatory Visit: Payer: Self-pay | Admitting: Gastroenterology

## 2011-05-27 DIAGNOSIS — E876 Hypokalemia: Secondary | ICD-10-CM

## 2011-05-27 NOTE — Telephone Encounter (Signed)
Wants to let you know to go ahead an fill out the paperwork for [employer] and what is the next course of action with her? Please advise??

## 2011-05-27 NOTE — Telephone Encounter (Signed)
Please see result note from CXR. I don't feel comfortable allowing her out of work more than this week, specifically with findings of a negative chest xray. She needs to discuss with her job different options regarding strenuous working conditions. Needs to increase Protonix to BID. Please inform her that even though we fill the papers out, sometimes it is not honored by the job site if they so choose.

## 2011-05-27 NOTE — Telephone Encounter (Signed)
Ordering BMP to recheck potassium and sed rate.

## 2011-05-27 NOTE — Telephone Encounter (Signed)
Called pt to let her know about the lab she needed to have done. She will go have them done in the morning. She also needs  a note to return back to work on Friday.

## 2011-05-27 NOTE — Telephone Encounter (Signed)
Called and spoke with pt. She said to tell Tobi Bastos that it is OK to fill out paper work, it has now turned into a short claim disability because she has missed 8 days work. She said she is still having the pain in her chest and upper abdominal pain. She is not comfortable to go back to work until she knows what the problem is. Please advise!

## 2011-05-27 NOTE — Assessment & Plan Note (Signed)
Increase Protonix to BID

## 2011-05-27 NOTE — Telephone Encounter (Signed)
See result note.  

## 2011-05-27 NOTE — Assessment & Plan Note (Addendum)
Rib pain and sternal discomfort as noted above in HPI, worsened with exertion. Pt has had work-up with pulmonology, who thought to be related to GI issues. She has had a CTA in the past. Requesting chest-xray today. Denies any prior injuries, fractures, etc. At this point, etiology unclear. Will increase Protonix to BID, although I feel she needs further work-up for possible inflammatory issues. She is refusing to return to work, as she states this is a strenuous job. At the last visit when she had diarrhea, I did allow her several days off work. However, she now has been out for 8 days straight, and she does not want to return unless a source is found. I have counseled her that she may not receive benefits or pay, and at this point I would allow the rest of this week off but no more through our office. Will discuss with Dr. Darrick Penna next route to take.   In interim, can draw sed rate, update CXR, use symptomatic measures such as heat packs intermittently.

## 2011-05-27 NOTE — Progress Notes (Signed)
Quick Note:  Thanks. Will be talking with Dr. Darrick Penna. Will know something tomorrow. ______

## 2011-05-27 NOTE — Progress Notes (Signed)
Quick Note:  Please inform pt this is normal. I still need to discuss with Dr. Darrick Penna further treatment plans for patient. As of right now, she needs to increase Protonix to BID as we discussed yesterday.  Also, please inform her that her job may not approve her being out of work this long, and it may take several different steps to find out the root issue. I can let her out the rest of this week, but I won't be able to do it after that. ______

## 2011-05-28 ENCOUNTER — Telehealth: Payer: Self-pay | Admitting: Gastroenterology

## 2011-05-28 LAB — BASIC METABOLIC PANEL
BUN: 15 mg/dL (ref 6–23)
CO2: 28 mEq/L (ref 19–32)
Chloride: 102 mEq/L (ref 96–112)
Creat: 0.81 mg/dL (ref 0.50–1.10)
Glucose, Bld: 82 mg/dL (ref 70–99)
Potassium: 3.4 mEq/L — ABNORMAL LOW (ref 3.5–5.3)

## 2011-05-28 NOTE — Telephone Encounter (Signed)
Yes. The note will be ready.

## 2011-05-28 NOTE — Progress Notes (Signed)
FMLA papers completed by Gerrit Halls, NP.  I faxed to [employer] at [phone]. Pt informed.

## 2011-05-28 NOTE — Telephone Encounter (Signed)
Pt called this morning asking if she could bring papers by today around lunch time for AS to fill out and she also asked for a detailed work note stating she can return to work on Monday, full duty with no restrictions.

## 2011-05-28 NOTE — Progress Notes (Signed)
Quick Note:  LMOM to call. ______ 

## 2011-05-28 NOTE — Telephone Encounter (Signed)
Please inform pt we would like to refer her to [facility] for a second opinion.

## 2011-05-28 NOTE — Progress Notes (Signed)
Cc to PCP 

## 2011-05-28 NOTE — Telephone Encounter (Signed)
REVIEWED.  

## 2011-05-28 NOTE — Progress Notes (Signed)
REVIEWED. AGREE. 

## 2011-05-28 NOTE — Progress Notes (Signed)
Quick Note:  Pt informed ______ 

## 2011-05-28 NOTE — Progress Notes (Signed)
Quick Note:  Normal. Needs to f/u with PCP regarding further referrals. I've discussed with Dr. Darrick Penna, and we will manage her IBS and GERD, but rib pain needs to be further assessed at PCP or specialist. ______

## 2011-05-28 NOTE — Progress Notes (Signed)
Quick Note:  Pt came by office and was informed. She picked up work note. ______

## 2011-05-28 NOTE — Telephone Encounter (Signed)
Put a note up front for her to pick up

## 2011-05-28 NOTE — Telephone Encounter (Signed)
Pt informed and agrees to the referral at [facility]. She brought more papers, she will need intermittent FMLA papers. Papers can be faxed to [employer].

## 2011-05-29 ENCOUNTER — Other Ambulatory Visit: Payer: Self-pay | Admitting: *Deleted

## 2011-05-29 NOTE — Telephone Encounter (Signed)
Referral and notes faxed to [facility]  

## 2011-06-01 NOTE — Progress Notes (Signed)
Quick Note:  Routed to Soledad Gerlach. ______

## 2011-06-01 NOTE — Progress Notes (Signed)
Quick Note:  Has hx of chronic hypokalemia, very mild. On potassium supplement daily.  Please just cc PCP with these updated labs. No need for further BMPs. ______

## 2011-06-04 ENCOUNTER — Telehealth: Payer: Self-pay | Admitting: Gastroenterology

## 2011-06-04 NOTE — Telephone Encounter (Signed)
Received fax- pt is scheduled to see Dr Merri Brunette on 08/18/11 @ 3:30- I have mailed letter as a reminder

## 2011-07-14 ENCOUNTER — Encounter: Payer: Self-pay | Admitting: Family Medicine

## 2011-07-14 ENCOUNTER — Ambulatory Visit (INDEPENDENT_AMBULATORY_CARE_PROVIDER_SITE_OTHER): Payer: 59 | Admitting: Family Medicine

## 2011-07-14 DIAGNOSIS — R079 Chest pain, unspecified: Secondary | ICD-10-CM

## 2011-07-14 DIAGNOSIS — R350 Frequency of micturition: Secondary | ICD-10-CM

## 2011-07-14 DIAGNOSIS — K219 Gastro-esophageal reflux disease without esophagitis: Secondary | ICD-10-CM

## 2011-07-14 DIAGNOSIS — F419 Anxiety disorder, unspecified: Secondary | ICD-10-CM

## 2011-07-14 DIAGNOSIS — K589 Irritable bowel syndrome without diarrhea: Secondary | ICD-10-CM

## 2011-07-14 DIAGNOSIS — F411 Generalized anxiety disorder: Secondary | ICD-10-CM

## 2011-07-14 DIAGNOSIS — N3090 Cystitis, unspecified without hematuria: Secondary | ICD-10-CM | POA: Insufficient documentation

## 2011-07-14 DIAGNOSIS — I10 Essential (primary) hypertension: Secondary | ICD-10-CM

## 2011-07-14 LAB — POCT URINALYSIS DIPSTICK
Bilirubin, UA: NEGATIVE
Glucose, UA: NEGATIVE
Ketones, UA: NEGATIVE
Leukocytes, UA: NEGATIVE
Protein, UA: NEGATIVE
Spec Grav, UA: 1.025

## 2011-07-14 MED ORDER — CEFTRIAXONE SODIUM 1 G IJ SOLR
500.0000 mg | Freq: Once | INTRAMUSCULAR | Status: AC
  Administered 2011-07-14: 500 mg via INTRAMUSCULAR

## 2011-07-14 NOTE — Assessment & Plan Note (Signed)
Pt to continue to follow with her psychiatrist. Medications are prescribed by psych

## 2011-07-14 NOTE — Assessment & Plan Note (Signed)
Chronic problem with alternating changes in bowel, currently with mild diarrhea, pt does not appear to be concerned at this time. She has hycosamine at home to be used  As well as probiotics

## 2011-07-14 NOTE — Progress Notes (Signed)
Subjective:    Patient ID: Bethany King, female    DOB: 09-06-1970, 41 y.o.   MRN: [id number]  HPI Pt here to establish care, previous PCP Dr. Lubertha [facility]   Hisotry or recurrent UTI and Kidney stones- has had renal stents bilaterally because of stones and hydronephrosis. Follows with  [facility]- has difficulty getting appt secondary to specialty clinic once a week. Today she feels like she has a UTI. She has some mild dysuria and pelvic pressure. As well as some lower back pain which is typical of her infections. Has had surgical intervention on left kidney for stones Currently on HCTZ for stones, increased water intake, avoiding calcium oxalate Last UTI 1 month ago- treated with Cipro 250mg    Costochrondritis- left side and left lower ribs, takes NSAIDS at time but has severe GI problems, neg CT chest in May 2012, neg CXR in December 2012  Arthritis-she has arthritic pain in joints, she is worried about bone loss as she cannot take calcium because of her stones. Her mother had osteoporosis.  GERD- takes prilosec,m protonix causes belching abd bloating- seen by Dr. Darrick Penna, s/p endooscpy and colonoscopy April 2012 Continues to have abdominal pain and cramping- will see [facility]   Foot surgery- plantar fascitis on left foot planning for surgery in the next few months.  Anxiety/panic attacks- takes Xanax on and off, for a year, takes Xanax three times a day, takes Vibryd. Follows Dr. Johnella Moloney - follows every 4 months   Hypokalemia- recently on potassium, had recent bloodwork potassium was 3.4  HTN- taking HCTZ, takes BP at home  PAP Smear- due in April 2013 Mammogram-due in May 2013 +flu shot  Tetanus shot overdue   No siblings, no family left    Review of Systems  GEN- denies fatigue, fever, weight loss,weakness,+ recent illness (gastroenteritis) HEENT- denies eye drainage, change in vision, nasal discharge, CVS- denies chest  pain, palpitations RESP- denies SOB, cough, wheeze ABD- denies N/V, change in stools, +abd pain, +diarrhea past 2 days, no blood in stool,  GU-+ dysuria, hematuria, dribbling, incontinence, +frequency MSK- + joint pain,+ muscle aches- back, injury Neuro- denies headache, dizziness, syncope, seizure activity      Objective:   Physical Exam GEN- NAD, alert and oriented x3 HEENT- PERRL, EOMI, non injected sclera, pink conjunctiva, MMM, oropharynx clear Neck- Supple, no thryomegaly CVS- RRR, no murmur RESP-CTAB ABD- NABS, soft,TTP in suprapubic region, no rebound, no gaurding, no CVA tenderness, mild epigastric tenderness, + EXT- No edema Pulses- Radial, DP- 2+ Psych- not depressed or anxious appearing       Assessment & Plan:

## 2011-07-14 NOTE — Assessment & Plan Note (Signed)
Patient's recurrent chest pains thought to be more GI related. She is that negative workup for her risk factors for coronary artery disease and is also seen pulmonary.  She also has MSK pain

## 2011-07-14 NOTE — Patient Instructions (Addendum)
It was nice to meet you today! I will review your medical records We will call about antibiotics for the urine infection Today you received a shot of Rocephin  Continue your blood pressure medication F/u in April for your physical exam with PAP Smear

## 2011-07-14 NOTE — Assessment & Plan Note (Signed)
Blood pressure at goal. Will obtain labs her records from her previous primary doctor. Continue hydrochlorothiazide

## 2011-07-14 NOTE — Assessment & Plan Note (Signed)
Will treat for cystitis based on symptoms. Patient has chronic microscopic hematuria secondary to stones. Will give shot of Rocephin.  urine will be sent for culture and antibiotics will be decided at that time.

## 2011-07-14 NOTE — Assessment & Plan Note (Signed)
Continue PPI. Patient has a lot of abdominal discomfort. We'll follow with GI as recommended

## 2011-07-27 ENCOUNTER — Encounter: Payer: Self-pay | Admitting: Family Medicine

## 2011-08-06 ENCOUNTER — Encounter: Payer: Self-pay | Admitting: Family Medicine

## 2011-08-06 NOTE — Progress Notes (Signed)
Subjective:    Patient ID: Bethany King, female    DOB: December 19, 1970, 41 y.o.   MRN: [id number]  HPI    Review of Systems     Objective:   Physical Exam        Assessment & Plan:  I received a form from patient's work.  She is complaining of achy legs and edema after long shifts. She also has varicose veins. Prescription was sent for compression hose as well as inserts for shoes for comfort secondary to Standing for long periods of time.

## 2011-09-04 ENCOUNTER — Encounter: Payer: Self-pay | Admitting: Family Medicine

## 2011-09-04 ENCOUNTER — Ambulatory Visit (INDEPENDENT_AMBULATORY_CARE_PROVIDER_SITE_OTHER): Payer: 59 | Admitting: Family Medicine

## 2011-09-04 VITALS — BP 118/84 | HR 73 | Temp 98.7°F | Resp 16 | Ht 63.0 in | Wt 176.1 lb

## 2011-09-04 DIAGNOSIS — I10 Essential (primary) hypertension: Secondary | ICD-10-CM

## 2011-09-04 DIAGNOSIS — E669 Obesity, unspecified: Secondary | ICD-10-CM | POA: Insufficient documentation

## 2011-09-04 DIAGNOSIS — Z8349 Family history of other endocrine, nutritional and metabolic diseases: Secondary | ICD-10-CM

## 2011-09-04 DIAGNOSIS — J019 Acute sinusitis, unspecified: Secondary | ICD-10-CM

## 2011-09-04 MED ORDER — AMOXICILLIN-POT CLAVULANATE 875-125 MG PO TABS: 1.0000 | ORAL_TABLET | Freq: Two times a day (BID) | ORAL | Status: AC

## 2011-09-04 NOTE — Patient Instructions (Signed)
Take the antibiotics for the sinus infection Try nasal saline  You can start the claritin Start multivitamins Get the labs done fasting

## 2011-09-06 ENCOUNTER — Encounter: Payer: Self-pay | Admitting: Family Medicine

## 2011-09-06 DIAGNOSIS — J019 Acute sinusitis, unspecified: Secondary | ICD-10-CM | POA: Insufficient documentation

## 2011-09-06 NOTE — Progress Notes (Signed)
Subjective:    Patient ID: Bethany King, female    DOB: [birth year], 41 y.o.   MRN: [id number]  HPI  Headache and nasal drainage x 1 week, feels drainage down throat, +ear pressure and pain, no cough, no fever,. Has tried claritin which has helped some.  Due for labs  Unsure if she needs to continue potassium S/p left plantar fasciotomy  Review of Systems   GEN- +fatigue, fever, weight loss,weakness, recent illness, +hot flashes HEENT- denies eye drainage, change in vision, +nasal discharge, CVS- denies chest pain, palpitations RESP- denies SOB, cough, wheeze ABD- denies N/V, change in stools,+ chronic abd pain Neuro- + headache, denies dizziness, syncope, seizure activity      Objective:   Physical Exam GEN- NAD, alert and oriented x3 HEENT- PERRL, EOMI, non injected sclera, pink conjunctiva, MMM,injected oropharynx, no exduates, fundoscopic exam neg, +post nasal drip,+sinus pressure-maxillary Neck- Supple, no LAD CVS- RRR, no murmur RESP-CTAB EXT- No edema,left foot in walking cast Pulses- Radial, DP- 2+        Assessment & Plan:  Will assess labs first and decided on potassium supplement

## 2011-09-06 NOTE — Assessment & Plan Note (Signed)
At goal.  

## 2011-09-06 NOTE — Assessment & Plan Note (Addendum)
Augmentin x 10 days She has some seasonal allergies,she can continue continue

## 2011-09-07 ENCOUNTER — Telehealth: Payer: Self-pay | Admitting: Family Medicine

## 2011-09-07 NOTE — Telephone Encounter (Signed)
noted 

## 2011-09-07 NOTE — Telephone Encounter (Signed)
Insurance will not pay for her labs until April 3. Wanted you to know

## 2011-09-16 LAB — COMPREHENSIVE METABOLIC PANEL
ALT: 11 U/L (ref 0–35)
Alkaline Phosphatase: 63 U/L (ref 39–117)
CO2: 30 mEq/L (ref 19–32)
Creat: 0.78 mg/dL (ref 0.50–1.10)
Sodium: 141 mEq/L (ref 135–145)
Total Bilirubin: 0.8 mg/dL (ref 0.3–1.2)
Total Protein: 6.5 g/dL (ref 6.0–8.3)

## 2011-09-16 LAB — CBC
HCT: 36.3 % (ref 36.0–46.0)
MCH: 27.4 pg (ref 26.0–34.0)
MCV: 88.1 fL (ref 78.0–100.0)
Platelets: 315 10*3/uL (ref 150–400)
RBC: 4.12 MIL/uL (ref 3.87–5.11)
RDW: 15 % (ref 11.5–15.5)
WBC: 6.4 10*3/uL (ref 4.0–10.5)

## 2011-09-16 LAB — LIPID PANEL
HDL: 60 mg/dL (ref >39)
LDL Cholesterol: 84 mg/dL (ref 0–99)
Triglycerides: 136 mg/dL (ref <150)
VLDL: 27 mg/dL (ref 0–40)

## 2011-09-16 LAB — HEMOGLOBIN A1C: Hgb A1c MFr Bld: 5.7 % — ABNORMAL HIGH (ref <5.7)

## 2011-09-18 ENCOUNTER — Other Ambulatory Visit: Payer: Self-pay | Admitting: Family Medicine

## 2011-09-18 DIAGNOSIS — Z139 Encounter for screening, unspecified: Secondary | ICD-10-CM

## 2011-10-01 ENCOUNTER — Encounter: Payer: 59 | Admitting: Family Medicine

## 2011-10-08 ENCOUNTER — Encounter: Payer: Self-pay | Admitting: Family Medicine

## 2011-10-08 ENCOUNTER — Ambulatory Visit (INDEPENDENT_AMBULATORY_CARE_PROVIDER_SITE_OTHER): Payer: 59 | Admitting: Family Medicine

## 2011-10-08 ENCOUNTER — Other Ambulatory Visit (HOSPITAL_COMMUNITY)
Admission: RE | Admit: 2011-10-08 | Discharge: 2011-10-08 | Disposition: A | Discharge status: Home / Self Care | Payer: 59 | Source: Ambulatory Visit | Attending: Family Medicine | Admitting: Family Medicine

## 2011-10-08 VITALS — BP 124/76 | HR 81 | Resp 18 | Ht 63.0 in | Wt 179.0 lb

## 2011-10-08 DIAGNOSIS — N200 Calculus of kidney: Secondary | ICD-10-CM

## 2011-10-08 DIAGNOSIS — Z0000 Encounter for general adult medical examination without abnormal findings: Secondary | ICD-10-CM

## 2011-10-08 DIAGNOSIS — Z23 Encounter for immunization: Secondary | ICD-10-CM

## 2011-10-08 DIAGNOSIS — Z01419 Encounter for gynecological examination (general) (routine) without abnormal findings: Secondary | ICD-10-CM | POA: Insufficient documentation

## 2011-10-08 DIAGNOSIS — E669 Obesity, unspecified: Secondary | ICD-10-CM

## 2011-10-08 DIAGNOSIS — G43909 Migraine, unspecified, not intractable, without status migrainosus: Secondary | ICD-10-CM | POA: Insufficient documentation

## 2011-10-08 DIAGNOSIS — N390 Urinary tract infection, site not specified: Secondary | ICD-10-CM

## 2011-10-08 LAB — POCT URINALYSIS DIPSTICK
Bilirubin, UA: NEGATIVE
Glucose, UA: NEGATIVE
Ketones, UA: NEGATIVE
Spec Grav, UA: 1.02

## 2011-10-08 MED ORDER — SUMATRIPTAN SUCCINATE 50 MG PO TABS: ORAL_TABLET | ORAL | Status: DC

## 2011-10-08 NOTE — Progress Notes (Signed)
Subjective:    Patient ID: Bethany King, female    DOB: January 10, 1971, 41 y.o.   MRN: [id number]  HPI  Pt here for GYN exam  UTI- recurrent, has typical symptoms, dysuria, low back pain, frequency. Would like a second opinion regarding prophylaxis against UTI Medications and history reviewed TDAP needed Mammogram to be scheduled  Review of Systems   GEN- denies fatigue, fever, weight loss,weakness, recent illness HEENT- denies eye drainage, change in vision, nasal discharge, CVS- denies chest pain, palpitations RESP- denies SOB, cough, wheeze ABD- denies N/V, change in stools, abd pain GU- + dysuria, hematuria, dribbling, incontinence MSK- + joint pain, muscle aches, injury Neuro- denies headache, dizziness, syncope, seizure activity       Objective:   Physical Exam  GEN- NAD, alert and oriented x3 HEENT- PERRL, EOMI, non injected sclera, pink conjunctiva, MMM, oropharynx clear Neck- Supple, no thyromegaly CVS- RRR, no murmur RESP-CTAB ABD-soft, NT,ND Breast- normal symmetry, no nipple inversion,no nipple drainage, no nodules or lumps felt Nodes- no axillary nodes GU- normal external genitalia, vaginal mucosa pink and moist, cervix visualized no growth, no blood form os, no discharge, no CMT, no ovarian masses, uterus normal size EXT- No edema Pulses- Radial, DP- 2 FOBT-neg, rectum normal tone     Assessment & Plan:   CPD   CPE- PAP Smear done, mammo to be scheduled TDAP given    Labs reviewed

## 2011-10-08 NOTE — Patient Instructions (Signed)
I recommend 30 minutes of exercise 5 days a week. I recommend I visited once a year I recommend dental visit every 6 months Continue current medications  Our referred to urology for consultation.  We will call you antibiotics as needed per the culture Tetanus vaccine given today You can stop the potassium Follow-up 6 months

## 2011-10-09 ENCOUNTER — Telehealth: Payer: Self-pay

## 2011-10-09 ENCOUNTER — Encounter: Payer: Self-pay | Admitting: Family Medicine

## 2011-10-09 DIAGNOSIS — N390 Urinary tract infection, site not specified: Secondary | ICD-10-CM | POA: Insufficient documentation

## 2011-10-09 NOTE — Assessment & Plan Note (Signed)
Patient symptomatic today however he daily is not indicative of infection. We'll send urine for culture prior to any treatment. I will refer her to [facility] to discuss any prophylaxis regarding her recurrent UTIs. She was to try something different than been on antibiotics because of her ibs

## 2011-10-09 NOTE — Assessment & Plan Note (Signed)
Motivated to return to activity, since she has been released from her foot surgery

## 2011-10-10 LAB — URINE CULTURE: Colony Count: >=100000

## 2011-10-11 NOTE — Telephone Encounter (Signed)
noted 

## 2011-10-12 ENCOUNTER — Telehealth: Payer: Self-pay | Admitting: Family Medicine

## 2011-10-12 MED ORDER — CIPROFLOXACIN HCL 500 MG PO TABS: 500.0000 mg | ORAL_TABLET | Freq: Two times a day (BID) | ORAL | Status: AC

## 2011-10-12 NOTE — Telephone Encounter (Signed)
Please let pt know her urine culture, grew out many different bacteria I will go ahead and treat her symptoms with Cipro

## 2011-10-15 NOTE — Telephone Encounter (Signed)
Pt aware.

## 2011-10-19 ENCOUNTER — Ambulatory Visit (HOSPITAL_COMMUNITY): Payer: 59

## 2011-11-02 ENCOUNTER — Encounter: Payer: 59 | Admitting: Family Medicine

## 2011-11-04 ENCOUNTER — Other Ambulatory Visit: Payer: Self-pay

## 2011-11-04 MED ORDER — SUMATRIPTAN SUCCINATE 50 MG PO TABS: ORAL_TABLET | ORAL | Status: DC

## 2011-11-04 NOTE — Telephone Encounter (Signed)
Med refilled.

## 2011-11-16 ENCOUNTER — Encounter: Payer: Self-pay | Admitting: Family Medicine

## 2011-11-16 ENCOUNTER — Ambulatory Visit (HOSPITAL_COMMUNITY)
Admission: RE | Admit: 2011-11-16 | Discharge: 2011-11-16 | Disposition: A | Discharge status: Home / Self Care | Payer: 59 | Source: Ambulatory Visit | Attending: Family Medicine | Admitting: Family Medicine

## 2011-11-16 ENCOUNTER — Ambulatory Visit (INDEPENDENT_AMBULATORY_CARE_PROVIDER_SITE_OTHER): Payer: 59 | Admitting: Family Medicine

## 2011-11-16 VITALS — BP 114/70 | HR 92 | Resp 16 | Ht 63.0 in | Wt 180.0 lb

## 2011-11-16 DIAGNOSIS — M7550 Bursitis of unspecified shoulder: Secondary | ICD-10-CM

## 2011-11-16 DIAGNOSIS — Z139 Encounter for screening, unspecified: Secondary | ICD-10-CM

## 2011-11-16 DIAGNOSIS — M940 Chondrocostal junction syndrome [Tietze]: Secondary | ICD-10-CM

## 2011-11-16 DIAGNOSIS — M67919 Unspecified disorder of synovium and tendon, unspecified shoulder: Secondary | ICD-10-CM

## 2011-11-16 DIAGNOSIS — Z1231 Encounter for screening mammogram for malignant neoplasm of breast: Secondary | ICD-10-CM | POA: Insufficient documentation

## 2011-11-16 MED ORDER — MELOXICAM 7.5 MG PO TABS: 7.5000 mg | ORAL_TABLET | Freq: Every day | ORAL | Status: DC

## 2011-11-16 MED ORDER — HYDROCODONE-ACETAMINOPHEN 5-500 MG PO TABS: 1.0000 | ORAL_TABLET | Freq: Four times a day (QID) | ORAL | Status: DC | PRN

## 2011-11-16 NOTE — Patient Instructions (Signed)
Use the meloxicam for your shoulder and chest Use the vicodin for severe pain Continue the ice for your shoulder If you do not improve in the next 3 weeks, please call and we will send you to ortho

## 2011-11-17 ENCOUNTER — Encounter: Payer: Self-pay | Admitting: Family Medicine

## 2011-11-17 DIAGNOSIS — M7550 Bursitis of unspecified shoulder: Secondary | ICD-10-CM | POA: Insufficient documentation

## 2011-11-17 DIAGNOSIS — M940 Chondrocostal junction syndrome [Tietze]: Secondary | ICD-10-CM | POA: Insufficient documentation

## 2011-11-17 NOTE — Progress Notes (Signed)
Subjective:    Patient ID: Bethany King, female    DOB: [birth year], 41 y.o.   MRN: [id number]  HPI   Patient presents with left shoulder pain x2 weeks. She is back at work in her job consists mostly of repetitive over. She has pain and soreness when lifting her arm. It is sore to touch. Ibuprofen. She also noticed that her costochondritis has flared. The soreness in her chest is exactly the same as her previous flares of costochrondritis  Review of Systems - per above   GEN- denies fatigue, fever, weight loss,weakness, recent illness CVS- denies chest pain, palpitations RESP- denies SOB, cough, wheeze ABD- denies N/V, change in stools, abd pain MSK- +joint pain, muscle aches, injury Neuro- denies headache, dizziness, syncope, seizure activity       Objective:   Physical Exam GEN- NAD, alert and oriented x3 HEENT- PERRL, EOMI, non injected sclera, pink conjunctiva, MMM, oropharynx clear Neck- Supple, normal ROM CVS- RRR, no murmur RESP-CTAB Chest wall- TTP sternum and left side of sternum EXT- No edema Pulses- Radial, DP- 2+ MSK- normal inspection left shoulder, rotator cuff in tact, +pain with empty can at end of acromion, TTP over acromoion and down middle of deltoid, no swelling noted, biceps in tact, pain with ROM, +impignment signs Neuro- sensation in tact, normal tone upper ext        Assessment & Plan:

## 2011-11-17 NOTE — Assessment & Plan Note (Signed)
Pt  Recently went back to work, a lot of repetitive motions. Treat for bursitis based on exam, Meloxicam and Vicodin for severe pain, continue RICE therapy. If no improvement she will be referred to ortho

## 2011-11-17 NOTE — Assessment & Plan Note (Signed)
Flare of chest wall in setting of injury to her shoulder- NSAIDS to be used, she does not tolerate prednisone

## 2011-12-03 ENCOUNTER — Encounter: Payer: Self-pay | Admitting: Family Medicine

## 2011-12-03 ENCOUNTER — Ambulatory Visit (INDEPENDENT_AMBULATORY_CARE_PROVIDER_SITE_OTHER): Payer: 59 | Admitting: Family Medicine

## 2011-12-03 ENCOUNTER — Telehealth: Payer: Self-pay | Admitting: Family Medicine

## 2011-12-03 VITALS — BP 124/86 | HR 73 | Resp 18 | Ht 63.0 in | Wt 177.0 lb

## 2011-12-03 DIAGNOSIS — M25519 Pain in unspecified shoulder: Secondary | ICD-10-CM

## 2011-12-03 DIAGNOSIS — I10 Essential (primary) hypertension: Secondary | ICD-10-CM

## 2011-12-03 DIAGNOSIS — M25512 Pain in left shoulder: Secondary | ICD-10-CM | POA: Insufficient documentation

## 2011-12-03 DIAGNOSIS — M940 Chondrocostal junction syndrome [Tietze]: Secondary | ICD-10-CM

## 2011-12-03 MED ORDER — DICLOFENAC SODIUM 75 MG PO TBEC: 75.0000 mg | DELAYED_RELEASE_TABLET | Freq: Two times a day (BID) | ORAL | Status: DC

## 2011-12-03 MED ORDER — HYDROCODONE-ACETAMINOPHEN 5-500 MG PO TABS: 1.0000 | ORAL_TABLET | Freq: Four times a day (QID) | ORAL | Status: DC | PRN

## 2011-12-03 MED ORDER — RANITIDINE HCL 150 MG PO TABS: 150.0000 mg | ORAL_TABLET | Freq: Every day | ORAL | Status: DC

## 2011-12-03 NOTE — Telephone Encounter (Signed)
You can change the note

## 2011-12-03 NOTE — Patient Instructions (Addendum)
I will refer you to orthopedics for the shoulder Stop the mexlociam try the diclofenac twice a day Vicodin refilled Out of work until evaluated by ortho - please send any forms F/U 2 weeks

## 2011-12-03 NOTE — Assessment & Plan Note (Signed)
EKG normal, I think she is still in an inflammed state, will change her to diclofenac, possible injection of shoulder may resolve this, out of work for now

## 2011-12-03 NOTE — Progress Notes (Signed)
Subjective:    Patient ID: Bethany King, female    DOB: 04-26-1971, 41 y.o.   MRN: [id number]  HPI Patient presents to followup left shoulder pain and costochondritis. She was seen 3 weeks ago has had worsening pain in her left shoulder. She's been out of work the past 2 days because she is unable to do her duties which included a lot of overhead repetitive motions. Her anxiety has worsened and she had an emergency visit with her psychiatrist because she felt the pain was making her more emotional and she was crying and more anxious/ had panic attack Monday. Her benzo diazepam was increased and she was started on a sleeping medication. She had to put where she was having upset start with meloxicam and it was not helping very much of the pain therefore she stopped this. She did try taking it with an over-the-counter Mylanta. She does not think she can work at this time until this has resolved, she is willing to see any specialist needed.   Review of Systems   GEN- denies fatigue, fever, weight loss,weakness, recent illness CVS- denies chest pain, palpitations RESP- denies SOB, cough, wheeze ABD- denies N/V, change in stools, abd pain MSK- +joint pain, muscle aches, injury Neuro- denies headache, dizziness, syncope, seizure activity    Objective:   Physical Exam GEN- NAD, alert and oriented x3 Neck- Supple, normal ROM CVS- RRR, no murmur RESP-CTAB Chest wall- TTP sternum and beneath left breast  EXT- No edema Pulses- Radial, DP- 2+ MSK- normal inspection left shoulder, rotator cuff in tact, +pain with empty can at end of acromion, TTP over acromoion and down middle of deltoid, no swelling noted, biceps in tact, pain with ROM, Neuro- sensation in tact, normal tone upper ext  Psych-normal affect and mood  EKG-NSR, no ST changes, no arrythmia noted        Assessment & Plan:

## 2011-12-03 NOTE — Assessment & Plan Note (Signed)
BP at goal 

## 2011-12-03 NOTE — Assessment & Plan Note (Signed)
Treated for bursitis with meloxicam and Vicodin, she still has significant pain without a lot of weakness, will refer to orthopedic surgery for evaluation

## 2011-12-04 ENCOUNTER — Telehealth: Payer: Self-pay | Admitting: Family Medicine

## 2011-12-04 NOTE — Telephone Encounter (Signed)
Letter complete and awaiting pickup

## 2011-12-09 ENCOUNTER — Encounter: Payer: Self-pay | Admitting: Family Medicine

## 2011-12-11 ENCOUNTER — Other Ambulatory Visit: Payer: Self-pay | Admitting: Urology

## 2011-12-11 ENCOUNTER — Ambulatory Visit (INDEPENDENT_AMBULATORY_CARE_PROVIDER_SITE_OTHER): Payer: 59 | Admitting: Urology

## 2011-12-11 DIAGNOSIS — R1012 Left upper quadrant pain: Secondary | ICD-10-CM

## 2011-12-11 DIAGNOSIS — N200 Calculus of kidney: Secondary | ICD-10-CM

## 2011-12-11 DIAGNOSIS — Z87440 Personal history of urinary (tract) infections: Secondary | ICD-10-CM

## 2011-12-14 ENCOUNTER — Encounter: Payer: Self-pay | Admitting: Family Medicine

## 2011-12-16 ENCOUNTER — Ambulatory Visit (HOSPITAL_COMMUNITY)
Admission: RE | Admit: 2011-12-16 | Discharge: 2011-12-16 | Disposition: A | Discharge status: Home / Self Care | Payer: 59 | Source: Ambulatory Visit | Attending: Urology | Admitting: Urology

## 2011-12-16 ENCOUNTER — Other Ambulatory Visit: Payer: Self-pay | Admitting: Urology

## 2011-12-16 ENCOUNTER — Ambulatory Visit (INDEPENDENT_AMBULATORY_CARE_PROVIDER_SITE_OTHER): Payer: 59 | Admitting: Family Medicine

## 2011-12-16 ENCOUNTER — Encounter: Payer: Self-pay | Admitting: Family Medicine

## 2011-12-16 VITALS — BP 114/72 | HR 73 | Resp 18 | Ht 63.0 in | Wt 179.0 lb

## 2011-12-16 DIAGNOSIS — N200 Calculus of kidney: Secondary | ICD-10-CM | POA: Insufficient documentation

## 2011-12-16 DIAGNOSIS — M67919 Unspecified disorder of synovium and tendon, unspecified shoulder: Secondary | ICD-10-CM

## 2011-12-16 DIAGNOSIS — M719 Bursopathy, unspecified: Secondary | ICD-10-CM

## 2011-12-16 DIAGNOSIS — M7550 Bursitis of unspecified shoulder: Secondary | ICD-10-CM

## 2011-12-16 DIAGNOSIS — M940 Chondrocostal junction syndrome [Tietze]: Secondary | ICD-10-CM

## 2011-12-16 MED ORDER — HYDROCHLOROTHIAZIDE 25 MG PO TABS: 25.0000 mg | ORAL_TABLET | Freq: Every day | ORAL | Status: DC

## 2011-12-16 NOTE — Assessment & Plan Note (Signed)
Her costochondritis and not improved with a steroid injection however it is much improved just from having time off from work from repetitive motions. She is also trying to take anti-inflammatories daily. No further change in medications

## 2011-12-16 NOTE — Progress Notes (Signed)
Subjective:    Patient ID: Bethany King, female    DOB: 11-12-1970, 40 y.o.   MRN: [id number]  HPI Patient here for interim visit for her costochondritis and her bursitis of left shoulder. She was seen by orthopedic surgery who recommended physical therapy. She is only able to take her anti-inflammatory once a day secondary to upset stomach. She uses the Vicodin very rarely. She will start physical therapy 3 times a week for the left shoulder. She is follow with orthopedics next week to consider whether or not she needs an MRI. She's been taken out of work for 4 weeks. FMLA form was completed   Review of Systems     GEN- denies fatigue, fever, weight loss,weakness, recent illness CVS- denies chest pain, palpitations RESP- denies SOB, cough, wheeze ABD- denies N/V, change in stools, abd pain MSK- +joint pain, muscle aches, injury Neuro- denies headache, dizziness, syncope, seizure activity    Objective:   Physical Exam EN- NAD, alert and oriented x3 CVS- RRR, no murmur RESP-CTAB Chest wall- TTP sternum and beneath left breast  EXT- No edema Pulses- Radial, DP- 2+       Assessment & Plan:

## 2011-12-16 NOTE — Assessment & Plan Note (Signed)
Her pain is improved. She is status post steroid injection by orthopedics. I think physical therapy will be beneficial to her. I will have her followup with orthopedics for further recommendations or any imaging. I will see her back in about 2 and half weeks to see she can be released back to work. I anticipate that she will be able to do so

## 2011-12-16 NOTE — Patient Instructions (Signed)
Continue current medications  F/U with orthopedics F/U on July 18th for release to work

## 2011-12-22 ENCOUNTER — Other Ambulatory Visit (HOSPITAL_COMMUNITY): Payer: Self-pay | Admitting: Orthopedic Surgery

## 2011-12-22 DIAGNOSIS — M25512 Pain in left shoulder: Secondary | ICD-10-CM

## 2011-12-25 ENCOUNTER — Ambulatory Visit (HOSPITAL_COMMUNITY)
Admission: RE | Admit: 2011-12-25 | Discharge: 2011-12-25 | Disposition: A | Discharge status: Home / Self Care | Payer: 59 | Source: Ambulatory Visit | Attending: Orthopedic Surgery | Admitting: Orthopedic Surgery

## 2011-12-25 DIAGNOSIS — M25512 Pain in left shoulder: Secondary | ICD-10-CM

## 2011-12-25 DIAGNOSIS — M25519 Pain in unspecified shoulder: Secondary | ICD-10-CM | POA: Insufficient documentation

## 2011-12-30 ENCOUNTER — Ambulatory Visit (INDEPENDENT_AMBULATORY_CARE_PROVIDER_SITE_OTHER): Payer: 59 | Admitting: Family Medicine

## 2011-12-30 ENCOUNTER — Encounter: Payer: Self-pay | Admitting: Family Medicine

## 2011-12-30 VITALS — BP 122/82 | HR 92 | Resp 18 | Ht 63.0 in | Wt 183.0 lb

## 2011-12-30 DIAGNOSIS — R102 Pelvic and perineal pain: Secondary | ICD-10-CM

## 2011-12-30 DIAGNOSIS — M7550 Bursitis of unspecified shoulder: Secondary | ICD-10-CM

## 2011-12-30 DIAGNOSIS — M719 Bursopathy, unspecified: Secondary | ICD-10-CM

## 2011-12-30 DIAGNOSIS — M940 Chondrocostal junction syndrome [Tietze]: Secondary | ICD-10-CM

## 2011-12-30 DIAGNOSIS — M67919 Unspecified disorder of synovium and tendon, unspecified shoulder: Secondary | ICD-10-CM

## 2011-12-30 DIAGNOSIS — N949 Unspecified condition associated with female genital organs and menstrual cycle: Secondary | ICD-10-CM

## 2011-12-30 DIAGNOSIS — N920 Excessive and frequent menstruation with regular cycle: Secondary | ICD-10-CM | POA: Insufficient documentation

## 2011-12-30 MED ORDER — HYDROCODONE-ACETAMINOPHEN 5-500 MG PO TABS: 1.0000 | ORAL_TABLET | Freq: Four times a day (QID) | ORAL | Status: DC | PRN

## 2011-12-30 NOTE — Assessment & Plan Note (Signed)
Negative MRI, I will await ortho note but no other intervention with exception of PT for strengthening of rotator cuff muscles. I think she can benefit from Continued PT which would give her about 4 weeks of intense therapy. Continue NSAIDS, prn narcotic medication pt takes 1/2 tablet

## 2011-12-30 NOTE — Assessment & Plan Note (Addendum)
Will refer to GYN Needs pelvic ultrasound and work up

## 2011-12-30 NOTE — Patient Instructions (Signed)
I will refer you to GYN  Pain medication refilled  Continue the anti-inflammatories F/U 2 weeks- July 30th

## 2011-12-30 NOTE — Assessment & Plan Note (Signed)
Improving slowly 

## 2011-12-30 NOTE — Progress Notes (Signed)
Subjective:    Patient ID: Bethany King, female    DOB: 12-15-1970, 41 y.o.   MRN: [id number]  HPI Patient presents to followup as well as for left shoulder bursitis and costochondritis. She was seen by orthopedic surgery yesterday and has been released from their care with a negative MRI. They did advise her to continue physical therapy. She is currently going to physical therapy twice a week and is gaining benefit from this.  Pelvic pain and heavy. She is noted that she's had large clots and heavy periods for the past 2 months. This is been concerning to her. She does not want to try hormone therapy but will like to have this evaluated. She has painful cramps during her period and often has pain 2 weeks prior to her cycle starting to. LMP July 2nd   Review of Systems  GEN- denies fatigue, fever, weight loss,weakness, recent illness HEENT- denies eye drainage, change in vision, nasal discharge, CVS- denies chest pain, palpitations RESP- denies SOB, cough, wheeze ABD- denies N/V, change in stools, abd pain GU- denies dysuria, hematuria, dribbling, incontinence MSK- + joint pain, muscle aches, injury Neuro- denies headache, dizziness, syncope, seizure activity      Objective:   Physical Exam EN- NAD, alert and oriented x3 CVS- RRR, no murmur RESP-CTAB ABD-NABS,soft,NT,ND Chest wall- TTP sternum and beneath left breast  MSK- Good ROM left shoulder joint, strength in tact, TTP along AC area  EXT- No edema Pulses- Radial, DP- 2+        Assessment & Plan:

## 2012-01-04 NOTE — Progress Notes (Signed)
Patient ID: Bethany King, female   DOB: [birth year], 41 y.o.   MRN: [id number]   Addendum for [employer]   Restrictions/limitations- no overhead lifting or lifting > 20lbs for the next 2 weeks RTC 2 weeks- July 30th  Estimated Return to work date- Monday August 5th,2013.  with no restrictions

## 2012-01-12 ENCOUNTER — Ambulatory Visit (INDEPENDENT_AMBULATORY_CARE_PROVIDER_SITE_OTHER): Payer: 59 | Admitting: Family Medicine

## 2012-01-12 ENCOUNTER — Encounter: Payer: Self-pay | Admitting: Family Medicine

## 2012-01-12 VITALS — BP 122/70 | HR 75 | Resp 18 | Ht 63.0 in | Wt 182.0 lb

## 2012-01-12 DIAGNOSIS — M940 Chondrocostal junction syndrome [Tietze]: Secondary | ICD-10-CM

## 2012-01-12 DIAGNOSIS — M25512 Pain in left shoulder: Secondary | ICD-10-CM

## 2012-01-12 DIAGNOSIS — M7550 Bursitis of unspecified shoulder: Secondary | ICD-10-CM

## 2012-01-12 DIAGNOSIS — M25519 Pain in unspecified shoulder: Secondary | ICD-10-CM

## 2012-01-12 DIAGNOSIS — M67919 Unspecified disorder of synovium and tendon, unspecified shoulder: Secondary | ICD-10-CM

## 2012-01-12 NOTE — Patient Instructions (Signed)
Continue current medications Return to work- August 5th Keep previous f/u appointment

## 2012-01-12 NOTE — Assessment & Plan Note (Signed)
Continue NSAIDS, completed PT, no further orthopedic intervention Return to work no restrictions

## 2012-01-12 NOTE — Assessment & Plan Note (Signed)
Per above, I will obtain PT records

## 2012-01-12 NOTE — Assessment & Plan Note (Signed)
Minimal pain mostly with repetitive motions, if her pain intensifies obtain rib films, for now NSAIDS

## 2012-01-12 NOTE — Progress Notes (Signed)
Subjective:    Patient ID: Bethany King, female    DOB: 06-Dec-1970, 41 y.o.   MRN: [id number]  HPI Patient presents for interim visit 4 return to work for the left shoulder pain and costochondritis. She's completed physical therapy. Her strength has improved. She still has some pain with some overhead activities however is prepared to go back to work. She is continuing to take her anti-inflammatories as prescribed. She still has some soreness at bilateral rib cage   Review of Systems  - per above    GEN- denies fatigue, fever, weight loss,weakness, recent illness CVS- denies chest pain, palpitations RESP- denies SOB, cough, wheeze MSK- + joint pain, muscle aches, injury       Objective:   Physical Exam GEN- NAD, alert and oriented x3 CVS- RRR, no murmur RESP-CTAB MSK- Good ROM left shoulder joint, strength in tact, TTP along AC area , mild discomfort with overhead motions and lateral motions of left  Arm  EXT- No edema         Assessment & Plan:

## 2012-02-02 ENCOUNTER — Other Ambulatory Visit: Payer: Self-pay

## 2012-02-02 MED ORDER — HYDROCODONE-ACETAMINOPHEN 5-500 MG PO TABS: 1.0000 | ORAL_TABLET | Freq: Four times a day (QID) | ORAL | Status: DC | PRN

## 2012-02-09 ENCOUNTER — Ambulatory Visit (INDEPENDENT_AMBULATORY_CARE_PROVIDER_SITE_OTHER): Payer: 59 | Admitting: Family Medicine

## 2012-02-09 ENCOUNTER — Encounter: Payer: Self-pay | Admitting: Family Medicine

## 2012-02-09 VITALS — BP 128/72 | HR 78 | Resp 18 | Ht 63.0 in | Wt 182.0 lb

## 2012-02-09 DIAGNOSIS — F411 Generalized anxiety disorder: Secondary | ICD-10-CM

## 2012-02-09 DIAGNOSIS — E669 Obesity, unspecified: Secondary | ICD-10-CM

## 2012-02-09 DIAGNOSIS — F419 Anxiety disorder, unspecified: Secondary | ICD-10-CM

## 2012-02-09 DIAGNOSIS — M549 Dorsalgia, unspecified: Secondary | ICD-10-CM

## 2012-02-09 DIAGNOSIS — I10 Essential (primary) hypertension: Secondary | ICD-10-CM

## 2012-02-09 MED ORDER — CYCLOBENZAPRINE HCL 10 MG PO TABS: 10.0000 mg | ORAL_TABLET | Freq: Three times a day (TID) | ORAL | Status: DC | PRN

## 2012-02-09 NOTE — Patient Instructions (Signed)
Muscle relaxant given Continue other medications Continue to stretch for the back  F/U 6 months

## 2012-02-09 NOTE — Progress Notes (Signed)
Subjective:    Patient ID: Bethany King, female    DOB: 01-14-1971, 41 y.o.   MRN: [id number]  HPI  Patient presents with right lower back pain for the past 5 days. She's been back to work on her full time schedule however they have been working overtime. She is very repetitive movements. She feels she has strained her lower back while bending down. She's been taking anti-inflammatory and hydrocodone for severe pain she is also been using heat and ice with little improvement. She denies any radicular symptoms denies change in bowel or bladder. Hypertension-tolerating blood pressure medications has no concerns. Anxiety/depression- she is followed by her psychiatrist every 4 months. Her medications have been unchanged she is doing well. Seen by urology and GYN  Review of Systems  GEN- denies fatigue, fever, weight loss,weakness, recent illness HEENT- denies eye drainage, change in vision, nasal discharge, CVS- denies chest pain, palpitations RESP- denies SOB, cough, wheeze ABD- denies N/V, change in stools, abd pain GU- denies dysuria, hematuria, dribbling, incontinence MSK- + joint pain, muscle aches, injury Neuro- denies headache, dizziness, syncope, seizure activity      Objective:   Physical Exam GEN- NAD, alert and oriented x3 CVS- RRR, no murmur RESP-CTAB Back- TTP lumbar region, spine NT, +spasm locked muscle Right paraspinals, neg SLR, able to squat and flex with mild discomfort  Neuro- no focal deficits, strength equal bilt LE, sensation in tact  EXT- No edema Pulses- Radial, DP- 2+ Psych-normal affect and Mood        Assessment & Plan:

## 2012-02-10 DIAGNOSIS — M549 Dorsalgia, unspecified: Secondary | ICD-10-CM | POA: Insufficient documentation

## 2012-02-10 NOTE — Assessment & Plan Note (Signed)
Well controlled 

## 2012-02-10 NOTE — Assessment & Plan Note (Signed)
Lumbar strain with appears to be tight or locked muscle, add muscle relaxant , she is to try massage

## 2012-02-10 NOTE — Assessment & Plan Note (Signed)
Stable, followed by psych 

## 2012-02-10 NOTE — Assessment & Plan Note (Signed)
She is now back at work, with increased activity, monitor diet, she has goals set for weight loss

## 2012-03-01 ENCOUNTER — Ambulatory Visit (INDEPENDENT_AMBULATORY_CARE_PROVIDER_SITE_OTHER): Payer: 59 | Admitting: Family Medicine

## 2012-03-01 ENCOUNTER — Encounter: Payer: Self-pay | Admitting: Family Medicine

## 2012-03-01 VITALS — BP 120/84 | HR 91 | Temp 98.8°F | Resp 16 | Ht 63.0 in | Wt 182.0 lb

## 2012-03-01 DIAGNOSIS — J329 Chronic sinusitis, unspecified: Secondary | ICD-10-CM

## 2012-03-01 DIAGNOSIS — G43909 Migraine, unspecified, not intractable, without status migrainosus: Secondary | ICD-10-CM

## 2012-03-01 MED ORDER — PROPRANOLOL HCL 10 MG PO TABS: 10.0000 mg | ORAL_TABLET | Freq: Two times a day (BID) | ORAL | Status: DC

## 2012-03-01 MED ORDER — AMOXICILLIN 500 MG PO CAPS: 500.0000 mg | ORAL_CAPSULE | Freq: Two times a day (BID) | ORAL | Status: AC

## 2012-03-01 MED ORDER — FLUTICASONE PROPIONATE 50 MCG/ACT NA SUSP: 1.0000 | Freq: Every day | NASAL | Status: DC

## 2012-03-01 NOTE — Progress Notes (Signed)
Subjective:    Patient ID: Bethany King, female    DOB: [birth year], 41 y.o.   MRN: [id number]  HPI Pt presents with worsened migraine for past month,She had tried neurontin, topamax and had side effects. Imitrex works but she has to use very frequently. Sinus pressure and drainage x 1 week , has foul odor to discharge.    Review of Systems - per above   GEN- denies fatigue, fever, weight loss,weakness, recent illness HEENT- denies eye drainage, change in vision,+ nasal discharge, CVS- denies chest pain, palpitations RESP- denies SOB, cough, wheeze ABD- denies N/V, change in stools, abd pain GU- denies dysuria, hematuria, dribbling, incontinence MSK- denies joint pain, muscle aches, injury Neuro- + headache, dizziness, syncope, seizure activity      Objective:   Physical Exam GEN- NAD, alert and oriented x3 HEENT- PERRL, EOMI, non injected sclera, pink conjunctiva, MMM, oropharynx clear, fundoscopic exam benign, nares clear rhinorrhea, +Maxillary sinus tenderness, TM clear bilat  Neck- Supple, no LAD CVS- RRR, no murmur RESP-CTAB EXT- No edema Pulse radial 2+ [id number] in tact, no focal deficits        Assessment & Plan:

## 2012-03-01 NOTE — Patient Instructions (Addendum)
Take the antibiotics  Use the nasal spray for sinus infection Start inderal once a day for migraine Continue imitrex  Bring in FMLA F/U 6 weeks

## 2012-03-02 DIAGNOSIS — J329 Chronic sinusitis, unspecified: Secondary | ICD-10-CM | POA: Insufficient documentation

## 2012-03-02 NOTE — Assessment & Plan Note (Signed)
Will treat as could be causing worsening migraine. FLonase , antibiotics

## 2012-03-02 NOTE — Assessment & Plan Note (Signed)
History of migraine, now uncontrolled, discussed caffeine, sugar intake, increasing water. Deteriorated in setting of stress at work and poor sleep, but using abortive treatment weekly. Trial of propranolol. I will complete intermittent FLMA, she has deductions at work. Refer to [facility] in [place] if no improvement

## 2012-03-04 ENCOUNTER — Ambulatory Visit (HOSPITAL_COMMUNITY)
Admission: RE | Admit: 2012-03-04 | Discharge: 2012-03-04 | Disposition: A | Discharge status: Home / Self Care | Payer: 59 | Source: Ambulatory Visit | Attending: Urology | Admitting: Urology

## 2012-03-04 ENCOUNTER — Other Ambulatory Visit: Payer: Self-pay | Admitting: Urology

## 2012-03-04 ENCOUNTER — Ambulatory Visit (INDEPENDENT_AMBULATORY_CARE_PROVIDER_SITE_OTHER): Payer: 59 | Admitting: Urology

## 2012-03-04 DIAGNOSIS — N200 Calculus of kidney: Secondary | ICD-10-CM

## 2012-03-04 DIAGNOSIS — R1012 Left upper quadrant pain: Secondary | ICD-10-CM

## 2012-03-04 DIAGNOSIS — R109 Unspecified abdominal pain: Secondary | ICD-10-CM | POA: Insufficient documentation

## 2012-04-11 ENCOUNTER — Ambulatory Visit: Payer: 59 | Admitting: Family Medicine

## 2012-04-11 ENCOUNTER — Other Ambulatory Visit (HOSPITAL_COMMUNITY)
Admission: RE | Admit: 2012-04-11 | Discharge: 2012-04-11 | Disposition: A | Discharge status: Home / Self Care | Payer: 59 | Source: Ambulatory Visit | Attending: Family Medicine | Admitting: Family Medicine

## 2012-04-11 ENCOUNTER — Encounter: Payer: Self-pay | Admitting: Family Medicine

## 2012-04-11 ENCOUNTER — Ambulatory Visit (INDEPENDENT_AMBULATORY_CARE_PROVIDER_SITE_OTHER): Payer: 59 | Admitting: Family Medicine

## 2012-04-11 VITALS — BP 136/78 | HR 87 | Resp 18 | Ht 63.0 in | Wt 182.1 lb

## 2012-04-11 DIAGNOSIS — Z113 Encounter for screening for infections with a predominantly sexual mode of transmission: Secondary | ICD-10-CM | POA: Insufficient documentation

## 2012-04-11 DIAGNOSIS — B349 Viral infection, unspecified: Secondary | ICD-10-CM

## 2012-04-11 DIAGNOSIS — N390 Urinary tract infection, site not specified: Secondary | ICD-10-CM

## 2012-04-11 DIAGNOSIS — B9789 Other viral agents as the cause of diseases classified elsewhere: Secondary | ICD-10-CM

## 2012-04-11 DIAGNOSIS — N760 Acute vaginitis: Secondary | ICD-10-CM | POA: Insufficient documentation

## 2012-04-11 DIAGNOSIS — G43909 Migraine, unspecified, not intractable, without status migrainosus: Secondary | ICD-10-CM

## 2012-04-11 LAB — POCT URINALYSIS DIPSTICK
Glucose, UA: NEGATIVE
Ketones, UA: NEGATIVE
Protein, UA: NEGATIVE
Spec Grav, UA: 1.015
Urobilinogen, UA: 0.2

## 2012-04-11 MED ORDER — PROMETHAZINE HCL 25 MG PO TABS: 25.0000 mg | ORAL_TABLET | Freq: Four times a day (QID) | ORAL | Status: DC | PRN

## 2012-04-11 NOTE — Assessment & Plan Note (Signed)
Cultures taken we'll await results before treatment

## 2012-04-11 NOTE — Assessment & Plan Note (Addendum)
Previously doing well on beta blocker, will try to restart at lower dose once current illness resolves

## 2012-04-11 NOTE — Addendum Note (Signed)
Addended by: Abner Greenspan on: 04/11/2012 12:43 PM   Modules accepted: Orders

## 2012-04-11 NOTE — Progress Notes (Signed)
Subjective:    Patient ID: Bethany King, female    DOB: [birth year], 41 y.o.   MRN: [id number]  HPI Patient presents with multiple symptoms. She states she's had flulike symptoms for the past week and a half. She received her flu shot of September 27 and had mild body aches for approximately 2 days which then resolved. Last week she began to have body aches all over congestion in her chest, nausea and mild diarrhea. She also had a small amount of burning with urination however unlike her previous UTIs. Her migraine headache did improve however the medication was started to make her dizzy therefore she stopped propanolol. She is very fatigued and missed work last Friday secondary to illness. + Vaginal discharge with itching and discomfort for past 3 days   Review of Systems - per above     GEN- denies fatigue, fever, weight loss,weakness, recent illness HEENT- denies eye drainage, change in vision,+ nasal discharge, CVS- denies chest pain, palpitations RESP- denies SOB, cough, wheeze ABD- + N/ denies V,+ change in stools, abd pain GU- +dysuria, hematuria, dribbling, incontinence MSK- denies joint pain, muscle aches, injury Neuro- + headache, dizziness, syncope, seizure activity     Objective:   Physical Exam GEN- NAD, alert and oriented x3, + fatgued appearing HEENT- PERRL, EOMI, non injected sclera, pink conjunctiva, MMM, oropharynx mild injection, nares clear Neck- Supple, no LAD CVS- RRR, no murmur RESP-CTAB ABS-NABS,soft,Mild lower quadrants, no rebound, no guarding GU- normal external genitalia, vaginal mucosa pink and moist, cervix visualized no growth, no blood form os, + discharge, no CMT, no ovarian masses, uterus normal size EXT- No edema Pulses- Radial 2+          Assessment & Plan:

## 2012-04-11 NOTE — Patient Instructions (Signed)
Phenergan for nausea Hold the propranolol until you are feeling better, you can restart at once a day  Viral illness- plenty of fluids, rest, Tylenol for body aches F/U 4 months

## 2012-04-11 NOTE — Assessment & Plan Note (Signed)
She is very mild symptoms. Her urinalysis is does not show any evidence of remarkable infection. We will send this for culture before treating

## 2012-04-11 NOTE — Assessment & Plan Note (Signed)
Viral illness. Supportive care. Antiemetic given for nausea I think she would benefit from being out of work today however she has multiple infractions against her. She was given a work note

## 2012-04-12 ENCOUNTER — Ambulatory Visit: Payer: 59 | Admitting: Family Medicine

## 2012-04-13 LAB — URINE CULTURE
Colony Count: NO GROWTH
Organism ID, Bacteria: NO GROWTH

## 2012-04-14 MED ORDER — METRONIDAZOLE 500 MG PO TABS: 500.0000 mg | ORAL_TABLET | Freq: Two times a day (BID) | ORAL | Status: DC

## 2012-04-14 NOTE — Addendum Note (Signed)
Addended by: Milinda Antis F on: 04/14/2012 09:37 AM   Modules accepted: Orders

## 2012-04-17 IMAGING — CR DG CHEST 1V PORT
1 series · 1 of 1 positions shown · non-contrast
Comparison: Chest radiograph performed 03/28/2010

CLINICAL DATA: Chest pain, radiating from the mid-sternum to the
back; shortness of breath when lying down.

PORTABLE CHEST - 1 VIEW

[view not recorded]
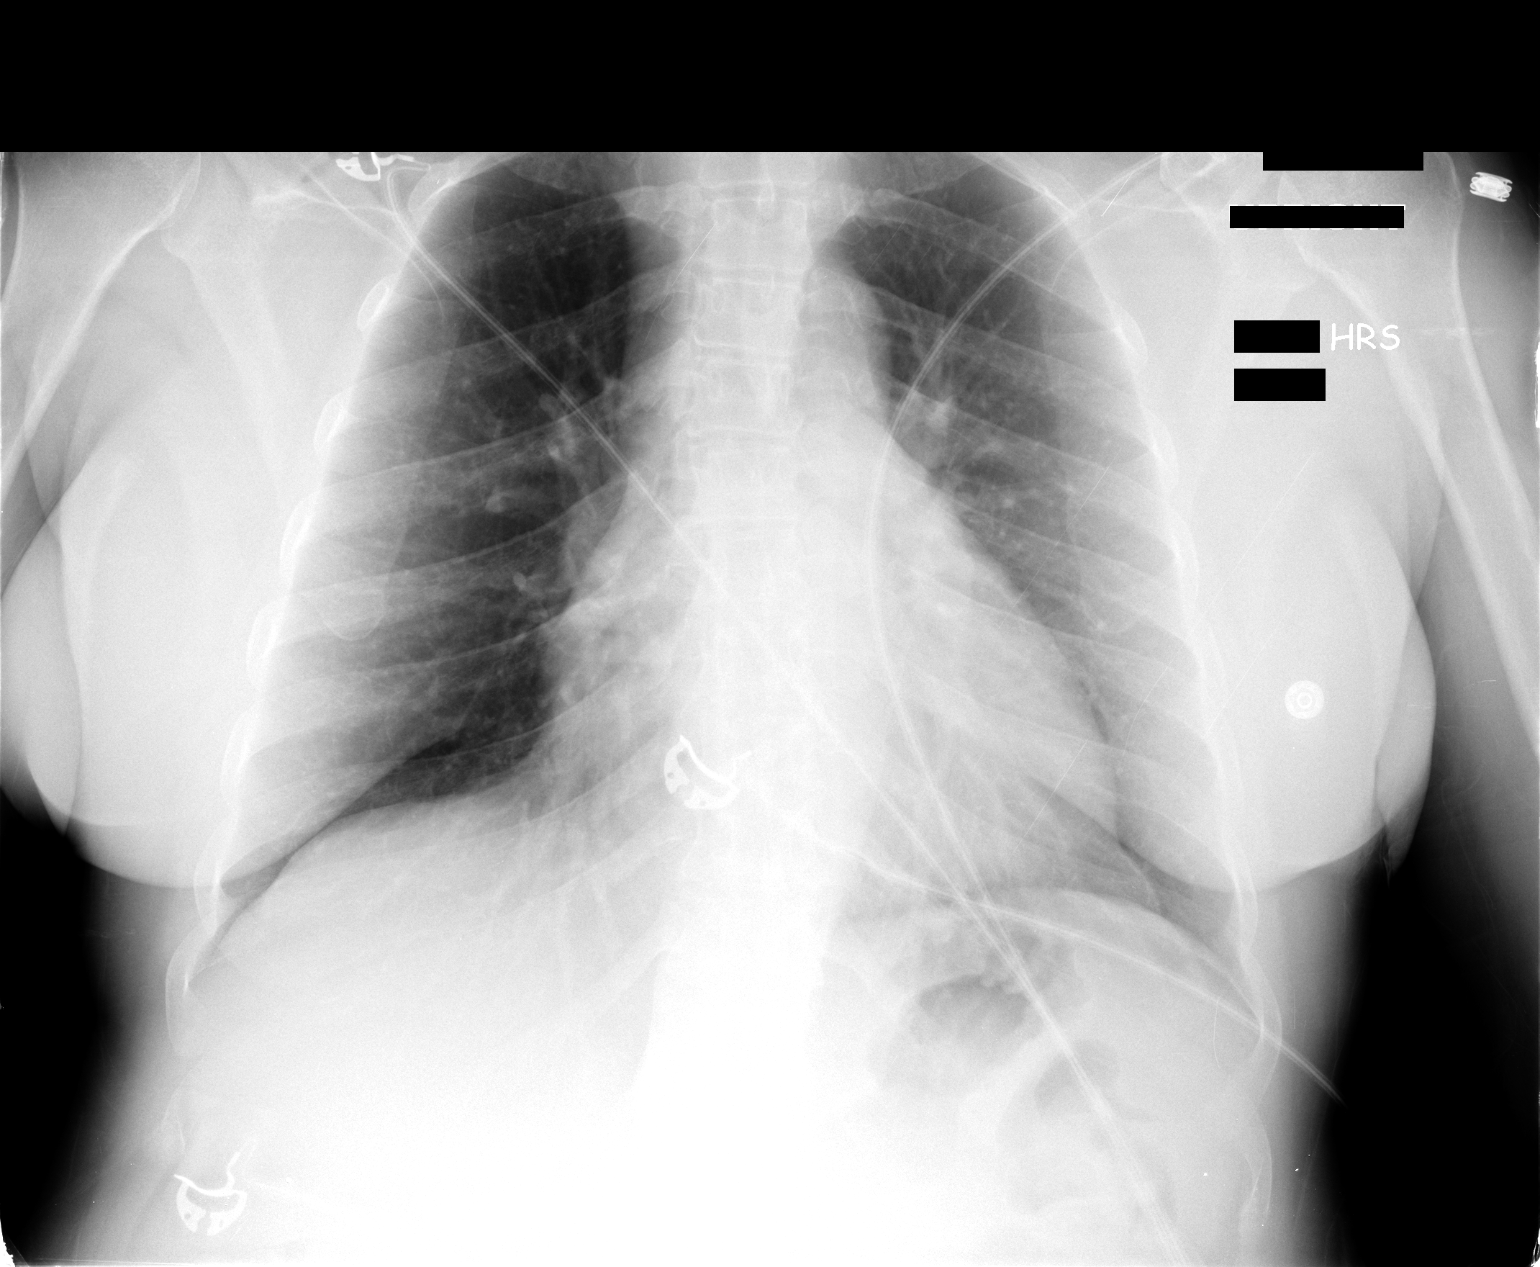

[1 of 1 positions shown; findings below may reference images not displayed]

FINDINGS: The lungs are well-aerated and clear.  There is no
evidence of focal opacification, pleural effusion or pneumothorax.

The cardiomediastinal silhouette is borderline normal in size;
calcification is noted within the aortic arch.  No acute osseous
abnormalities are seen.
IMPRESSION: No acute cardiopulmonary process seen.

## 2012-04-17 IMAGING — CT CT CTA ABD/PEL W/CM AND/OR W/O CM
2 of 6 series · 13 of 46 positions shown, 17 images · IV contrast (Omnipaque 300)
Comparison: 01/30/2005

CTA CHEST

CLINICAL DATA: Chest pain

CT ANGIOGRAPHY CHEST, ABDOMEN AND PELVIS
TECHNIQUE: Multidetector CT imaging through the chest, abdomen and
pelvis was performed using the standard protocol during bolus
administration of intravenous contrast.  Multiplanar reconstructed
images including MIPs were obtained and reviewed to evaluate the
vascular anatomy.
Contrast: 100 ml Omnipaque 350

[Series 5: dissection 3.0 b40f · axial · 0.73mm/px · z∈[-650,-82]mm · 11 of 217 slices shown, 15 images]
[im 19/217  soft-tissue]
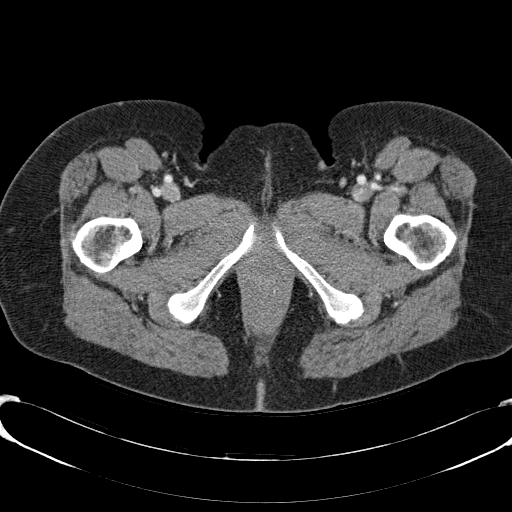
[im 19/217  bone]
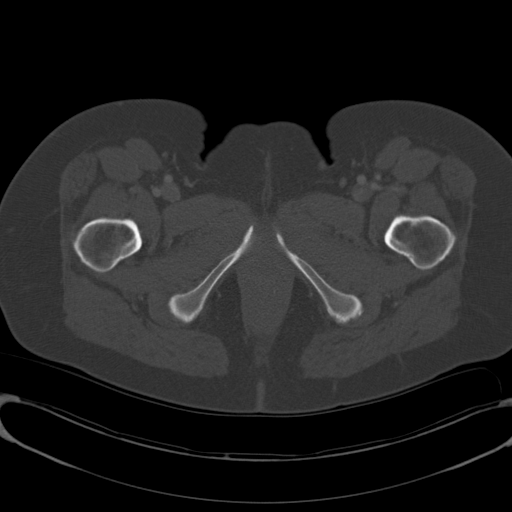
[im 37/217  soft-tissue]
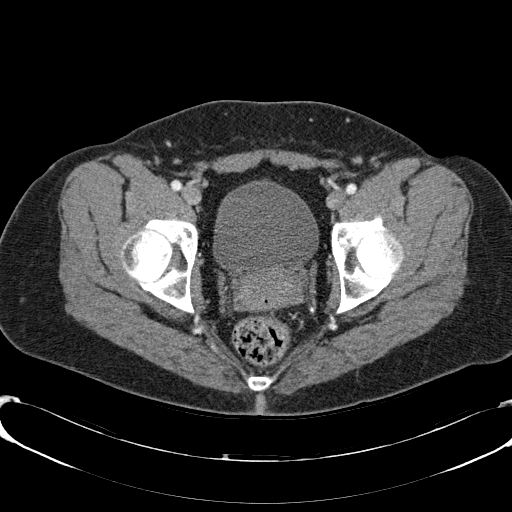
[im 64/217  soft-tissue]
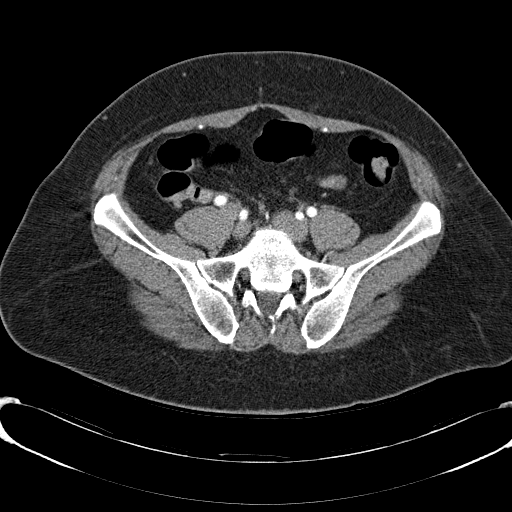
[im 82/217  soft-tissue]
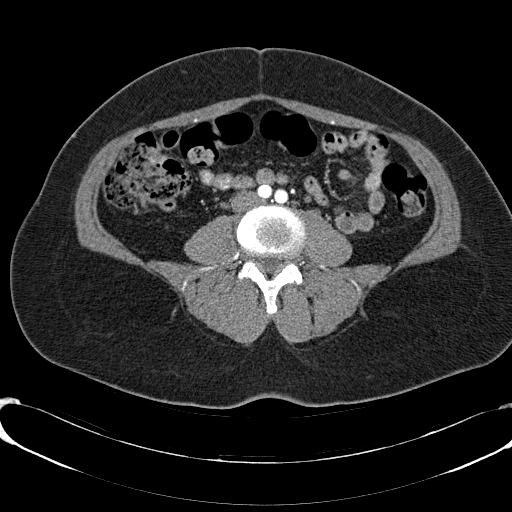
[im 109/217  soft-tissue]
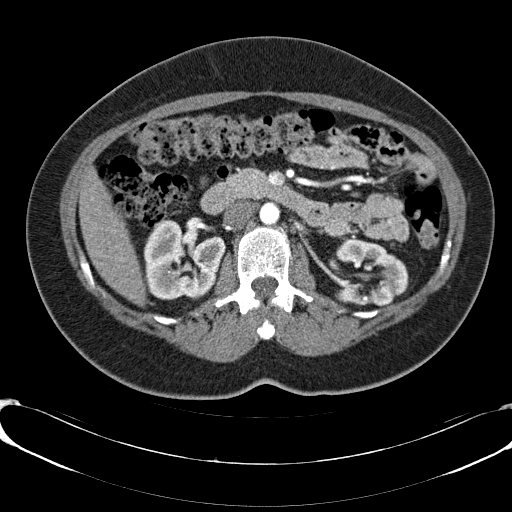
[im 136/217  soft-tissue]
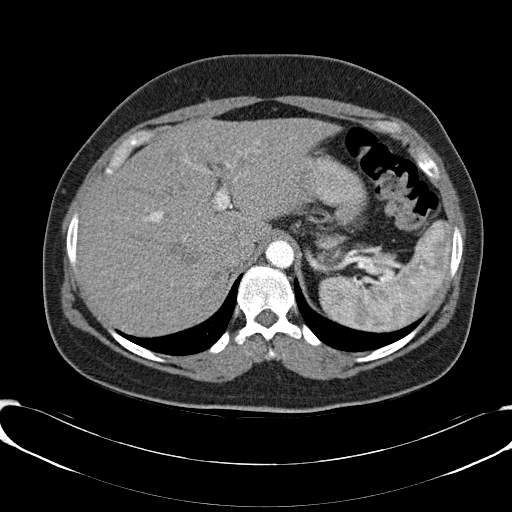
[im 154/217  soft-tissue]
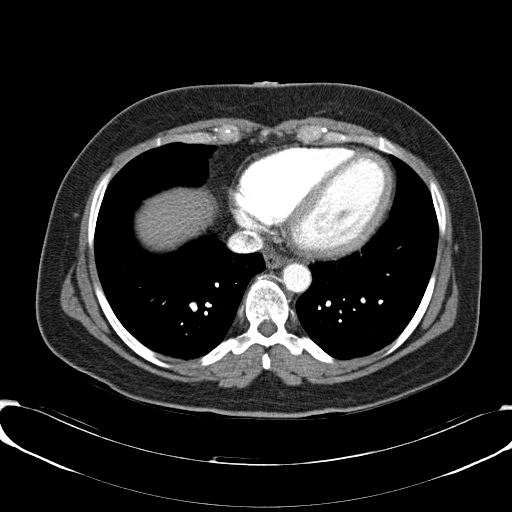
[im 181/217  soft-tissue]
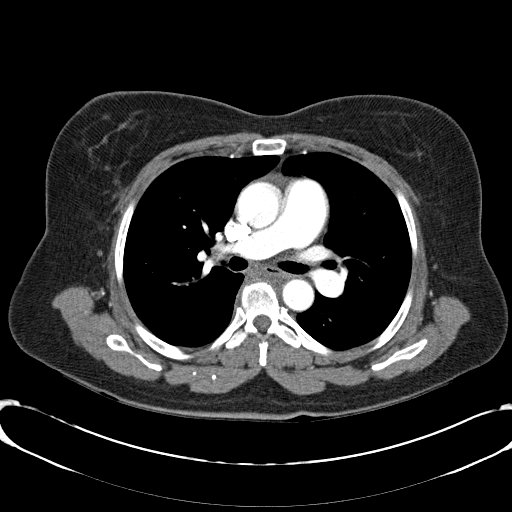
[im 181/217  lung]
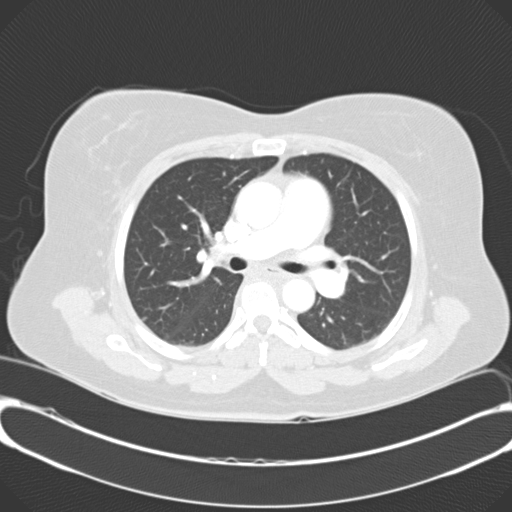
[im 190/217  lung]
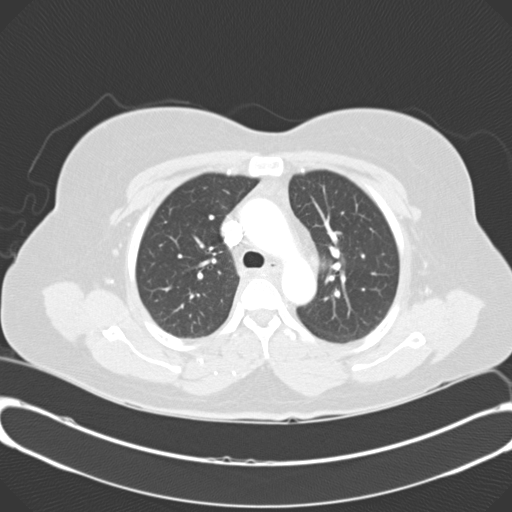
[im 199/217  soft-tissue]
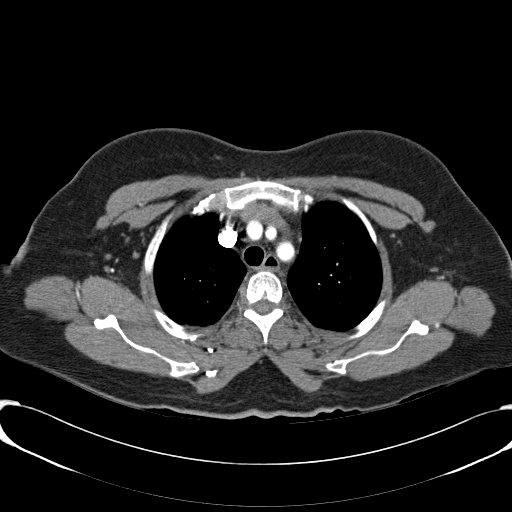
[im 199/217  lung]
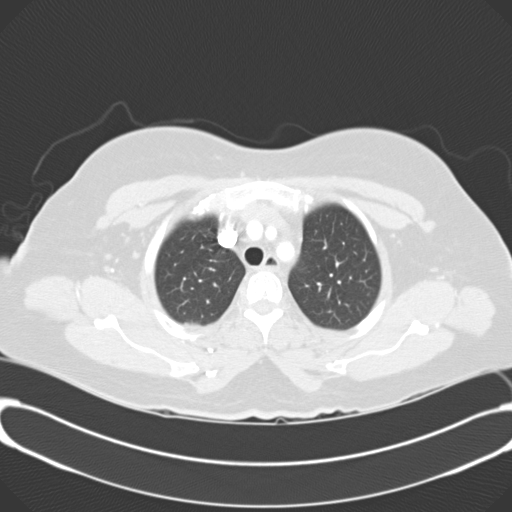
[im 199/217  bone]
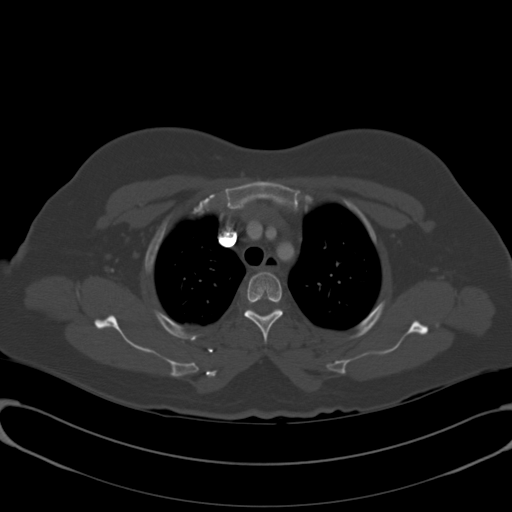
[im 208/217  lung]
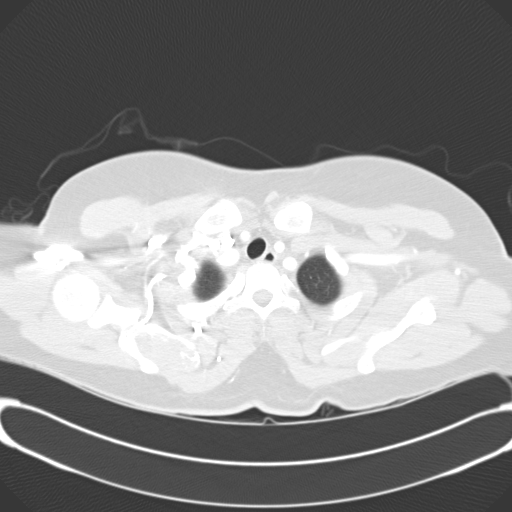

[Series 7: mpr cor post contrast · coronal · 0.74mm/px · 2 of 73 slices shown]
[im 25/73  soft-tissue]
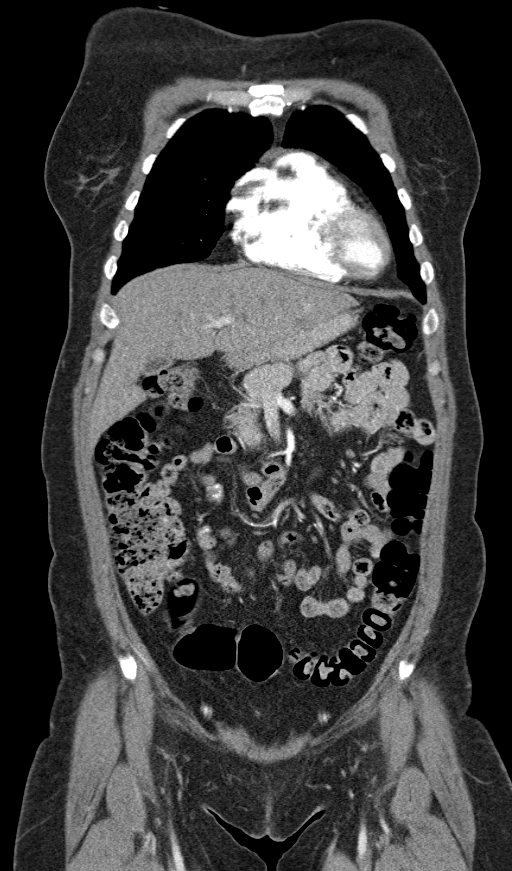
[im 49/73  soft-tissue]
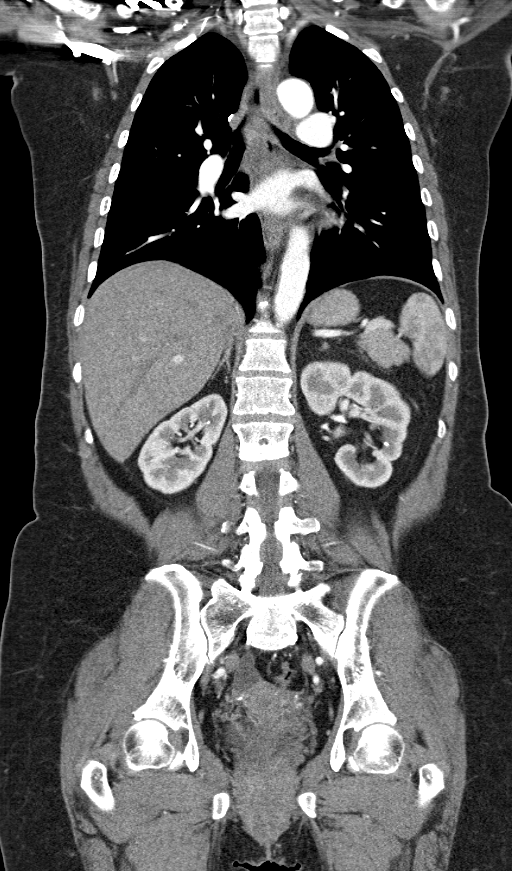

[13 of 46 positions shown; findings below may reference images not displayed]

FINDINGS: No evidence of intramural hematoma on precontrast
images.  No evidence of aortic dissection, aneurysm, or
transection.  Takeoff of the great vessels are patent.  Proximal
vertebral arteries are grossly patent.

No obvious filling defects are present in the pulmonary arterial
tree to suggest acute pulmonary thromboembolism.

Negative abnormal mediastinal or hilar adenopathy.  Residual thymus
is noted.

Minimal dependent atelectasis.  Otherwise clear lungs.

No pneumothorax or pleural effusion.

 Review of the MIP images confirms the above findings.
IMPRESSION: No dissection or acute cardiopulmonary disease.

CTA ABDOMEN AND PELVIS
FINDINGS: Aorta is nonaneurysmal and patent.  No evidence of
aortic dissection.  Celiac, SMA, and IMA are widely patent.  Single
renal arteries are widely patent.  Common, internal, and external
iliac arteries are patent without evidence of dissection, aneurysm,
or injury.

Hemangioma in the posterior right lobe of the liver superiorly on
image 74 is stable.  Small hypodensity anteriorly on the same image
is also stable and compatible with benign disease such as a
hemangioma.  Additional hypodensity in the lateral segment of the
left lobe on image 81, is also stable in retrospect.

Spleen, pancreas, adrenal glands, gallbladder are within normal
limits.

Kidneys remain irregular bilaterally with areas of atrophy
compatible with scarring.  Calculi within both kidneys are also
stable.  No hydronephrosis or delayed contrast excretion.

Uterus, bladder, and left ovary are within normal limits.  Right
ovarian cystic lesion is not significantly changed and 2.0 cm in
size.

No free fluid.  No abnormal adenopathy.

No destructive bone lesion.

 Review of the MIP images confirms the above findings.
IMPRESSION: No evidence of vascular dissection or aneurysm.

Stable hepatic hemangiomata.

No acute pathology.

## 2012-04-26 ENCOUNTER — Telehealth: Payer: Self-pay | Admitting: Family Medicine

## 2012-04-26 ENCOUNTER — Other Ambulatory Visit: Payer: Self-pay

## 2012-04-26 MED ORDER — CYCLOBENZAPRINE HCL 10 MG PO TABS: 10.0000 mg | ORAL_TABLET | Freq: Three times a day (TID) | ORAL | Status: DC | PRN

## 2012-04-26 NOTE — Telephone Encounter (Signed)
Med refilled and called and left message for patient notifying her that med is at pharmacy.  Also notified that FMLA papers were completed and faxed and that she could collect original if she would like.

## 2012-05-27 ENCOUNTER — Ambulatory Visit (INDEPENDENT_AMBULATORY_CARE_PROVIDER_SITE_OTHER): Payer: 59 | Admitting: Family Medicine

## 2012-05-27 ENCOUNTER — Encounter: Payer: Self-pay | Admitting: Family Medicine

## 2012-05-27 VITALS — BP 118/70 | HR 73 | Resp 16 | Ht 63.0 in | Wt 186.1 lb

## 2012-05-27 DIAGNOSIS — M549 Dorsalgia, unspecified: Secondary | ICD-10-CM

## 2012-05-27 DIAGNOSIS — M5430 Sciatica, unspecified side: Secondary | ICD-10-CM

## 2012-05-27 MED ORDER — MELOXICAM 15 MG PO TABS: 15.0000 mg | ORAL_TABLET | Freq: Every day | ORAL | Status: DC

## 2012-05-27 MED ORDER — CYCLOBENZAPRINE HCL 10 MG PO TABS: 10.0000 mg | ORAL_TABLET | Freq: Three times a day (TID) | ORAL | Status: DC | PRN

## 2012-05-27 MED ORDER — HYDROCODONE-ACETAMINOPHEN 5-500 MG PO TABS: 1.0000 | ORAL_TABLET | Freq: Four times a day (QID) | ORAL | Status: DC | PRN

## 2012-05-27 MED ORDER — ACYCLOVIR 400 MG PO TABS: 400.0000 mg | ORAL_TABLET | Freq: Every day | ORAL | Status: AC

## 2012-05-27 NOTE — Patient Instructions (Addendum)
Stop ibuprofen, start Mobic Continue flexeril Vicodin for severe pain

## 2012-05-29 ENCOUNTER — Encounter: Payer: Self-pay | Admitting: Family Medicine

## 2012-05-29 DIAGNOSIS — M5430 Sciatica, unspecified side: Secondary | ICD-10-CM | POA: Insufficient documentation

## 2012-05-29 NOTE — Progress Notes (Signed)
Subjective:    Patient ID: Bethany King, female    DOB: 15-Apr-1971, 41 y.o.   MRN: [id number]  HPI  Back pain for past few weeks, no specific injury, seen by chiropractor, manipulations done which have been helping has also been having sharp electrical like impulses down left leg as well as numbness and tingling, xray in his office per pt showed bulging disc and she was told she had sciatica. Has been using flexeril out of pain meds, using ibuprofen, has not missed work No change in bowel or bladder  Review of Systems   GEN- denies fatigue, fever, weight loss,weakness, recent illness CVS- denies chest pain, palpitations RESP- denies SOB, cough, wheeze MSK- + joint pain,+ muscle aches, injury Neuro- denies headache, dizziness, syncope, seizure activity      Objective:   Physical Exam GEN- NAD, alert and oriented x3 Neck- supple, normal ROM Back- Mild TTP lumbar region,+spasm, normal ROM, good squat walks on tips or toes and heels Neuro- no focal deficits, strength equal bilt LE, sensation in tact DTR symmetric EXT- No edema Pulses- Radial, DP- 2+        Assessment & Plan:

## 2012-05-29 NOTE — Assessment & Plan Note (Signed)
Does not tolerate gabapentin, lyrica, with her vibriiyd would prefer not to add TCA

## 2012-05-29 NOTE — Assessment & Plan Note (Signed)
Acute back pain with some sciatica , exam reassuring repetitive motions with job, followed by [facility] for now, i will obtain imaging done Start meloxicam, continue flexeril at bedtime, 30 tabs vicodin

## 2012-07-08 ENCOUNTER — Ambulatory Visit (INDEPENDENT_AMBULATORY_CARE_PROVIDER_SITE_OTHER): Payer: 59 | Admitting: Family Medicine

## 2012-07-08 ENCOUNTER — Encounter: Payer: Self-pay | Admitting: Family Medicine

## 2012-07-08 VITALS — BP 130/80 | HR 85 | Temp 98.9°F | Resp 16 | Ht 63.0 in | Wt 189.1 lb

## 2012-07-08 DIAGNOSIS — M940 Chondrocostal junction syndrome [Tietze]: Secondary | ICD-10-CM

## 2012-07-08 DIAGNOSIS — J329 Chronic sinusitis, unspecified: Secondary | ICD-10-CM

## 2012-07-08 DIAGNOSIS — M549 Dorsalgia, unspecified: Secondary | ICD-10-CM

## 2012-07-08 DIAGNOSIS — M5430 Sciatica, unspecified side: Secondary | ICD-10-CM

## 2012-07-08 MED ORDER — CYCLOBENZAPRINE HCL 10 MG PO TABS: 10.0000 mg | ORAL_TABLET | Freq: Three times a day (TID) | ORAL | Status: DC | PRN

## 2012-07-08 MED ORDER — AMOXICILLIN-POT CLAVULANATE 200-28.5 MG PO CHEW: 1.0000 | CHEWABLE_TABLET | Freq: Two times a day (BID) | ORAL | Status: DC

## 2012-07-08 NOTE — Progress Notes (Signed)
Subjective:    Patient ID: [name], female    DOB: 1971-05-05, 42 y.o.   MRN: [id number]  HPI  Patient here with sinus congestion and drainage for the past 2 weeks also left ear pain which feels like her chest congestion that she has a swelling beneath her ribs. Her costochondritis has been giving her problems her stay I did continues to give her problems. She knows is due to her job and is asking taken out of work for another week. She's used her FMLA already this month for her costochondritis. She stopped the meloxicam because he gave her upset stomach she still uses Flexeril and her hydrocodone which is not working.  She is taking Mucinex over-the-counter for the sinuses  Review of Systems  GEN- + fatigue, fever, weight loss,weakness, recent illness HEENT- denies eye drainage, change in vision, +nasal discharge, CVS- denies chest pain, palpitations RESP- denies SOB, +cough, wheeze ABD- denies N/V, change in stools, abd pain GU- denies dysuria, hematuria, dribbling, incontinence MSK- denies joint pain, muscle aches, injury Neuro- denies headache, dizziness, syncope, seizure activity      Objective:   Physical Exam GEN- NAD, alert and oriented x3 HEENT- PERRL, EOMI, non injected sclera, pink conjunctiva, MMM, oropharynx mild injection, TM clear bilat no effusion,  + maxillary sinus tenderness, inflammed turbinates,  Nasal drainage  Neck- Supple, no LAD CVS- RRR, no murmur RESP-CTAB EXT- No edema Pulses- Radial 2+         Assessment & Plan:

## 2012-07-08 NOTE — Patient Instructions (Addendum)
Start antibiotics for sinus infection Continue the Flonase If he can tolerate to Mucinex this is good for the sinuses Flexeril refill Called when you are able to be evaluated for the back pain

## 2012-07-10 MED ORDER — AMOXICILLIN-POT CLAVULANATE 875-125 MG PO TABS: 1.0000 | ORAL_TABLET | Freq: Two times a day (BID) | ORAL | Status: DC

## 2012-07-10 NOTE — Assessment & Plan Note (Signed)
Worsened in setting of acute illness, she has FMLA on file, I discussed with her I will not take her out of work for another week, she has no new injury, illness. She voiced understanding

## 2012-07-10 NOTE — Assessment & Plan Note (Addendum)
She continues to complain for back pain on and off, this is aggravated by the repetitive work she does at her job. She is unable to afford imaging at this time, would also like to refer her to Dr. Eduard Clos, she has had epidural injections per report Would obtain plain lumbar xrays first.  Per above regarding her request to some out of work She uses small doses of NSAIDS, did not tolerate mobic, continue flexeril and the vicodin she already has

## 2012-07-10 NOTE — Assessment & Plan Note (Signed)
Augmentin x 10 days, nasal saline, fluids

## 2012-07-10 NOTE — Assessment & Plan Note (Signed)
She is still having sciatica with her back pain, per above regarding further work-up

## 2012-07-11 ENCOUNTER — Ambulatory Visit: Payer: 59 | Admitting: Family Medicine

## 2012-08-16 ENCOUNTER — Encounter (HOSPITAL_COMMUNITY): Payer: Self-pay | Admitting: *Deleted

## 2012-08-16 ENCOUNTER — Emergency Department (HOSPITAL_COMMUNITY)
Admission: EM | Admit: 2012-08-16 | Discharge: 2012-08-16 | Disposition: A | Discharge status: Home / Self Care | Payer: 59 | Attending: Emergency Medicine | Admitting: Emergency Medicine

## 2012-08-16 DIAGNOSIS — Z87442 Personal history of urinary calculi: Secondary | ICD-10-CM | POA: Insufficient documentation

## 2012-08-16 DIAGNOSIS — Z79899 Other long term (current) drug therapy: Secondary | ICD-10-CM | POA: Insufficient documentation

## 2012-08-16 DIAGNOSIS — F3289 Other specified depressive episodes: Secondary | ICD-10-CM | POA: Insufficient documentation

## 2012-08-16 DIAGNOSIS — Z862 Personal history of diseases of the blood and blood-forming organs and certain disorders involving the immune mechanism: Secondary | ICD-10-CM | POA: Insufficient documentation

## 2012-08-16 DIAGNOSIS — Z87448 Personal history of other diseases of urinary system: Secondary | ICD-10-CM | POA: Insufficient documentation

## 2012-08-16 DIAGNOSIS — R079 Chest pain, unspecified: Secondary | ICD-10-CM

## 2012-08-16 DIAGNOSIS — Z3202 Encounter for pregnancy test, result negative: Secondary | ICD-10-CM | POA: Insufficient documentation

## 2012-08-16 DIAGNOSIS — F329 Major depressive disorder, single episode, unspecified: Secondary | ICD-10-CM | POA: Insufficient documentation

## 2012-08-16 DIAGNOSIS — I10 Essential (primary) hypertension: Secondary | ICD-10-CM | POA: Insufficient documentation

## 2012-08-16 DIAGNOSIS — Z8505 Personal history of malignant neoplasm of liver: Secondary | ICD-10-CM | POA: Insufficient documentation

## 2012-08-16 DIAGNOSIS — Z87440 Personal history of urinary (tract) infections: Secondary | ICD-10-CM | POA: Insufficient documentation

## 2012-08-16 DIAGNOSIS — Z8739 Personal history of other diseases of the musculoskeletal system and connective tissue: Secondary | ICD-10-CM | POA: Insufficient documentation

## 2012-08-16 DIAGNOSIS — F411 Generalized anxiety disorder: Secondary | ICD-10-CM | POA: Insufficient documentation

## 2012-08-16 DIAGNOSIS — N898 Other specified noninflammatory disorders of vagina: Secondary | ICD-10-CM | POA: Insufficient documentation

## 2012-08-16 DIAGNOSIS — R109 Unspecified abdominal pain: Secondary | ICD-10-CM | POA: Insufficient documentation

## 2012-08-16 DIAGNOSIS — K219 Gastro-esophageal reflux disease without esophagitis: Secondary | ICD-10-CM | POA: Insufficient documentation

## 2012-08-16 DIAGNOSIS — Z8719 Personal history of other diseases of the digestive system: Secondary | ICD-10-CM | POA: Insufficient documentation

## 2012-08-16 DIAGNOSIS — N939 Abnormal uterine and vaginal bleeding, unspecified: Secondary | ICD-10-CM

## 2012-08-16 LAB — POCT I-STAT, CHEM 8
Creatinine, Ser: 0.9 mg/dL (ref 0.50–1.10)
Glucose, Bld: 89 mg/dL (ref 70–99)
HCT: 35 % — ABNORMAL LOW (ref 36.0–46.0)
Hemoglobin: 11.9 g/dL — ABNORMAL LOW (ref 12.0–15.0)
Potassium: 3 mEq/L — ABNORMAL LOW (ref 3.5–5.1)
Sodium: 141 mEq/L (ref 135–145)
TCO2: 32 mmol/L (ref 0–100)

## 2012-08-16 MED ORDER — POTASSIUM CHLORIDE CRYS ER 20 MEQ PO TBCR
40.0000 meq | EXTENDED_RELEASE_TABLET | Freq: Once | ORAL | Status: AC
  Administered 2012-08-16: 40 meq via ORAL
  Filled 2012-08-16: qty 2

## 2012-08-16 MED ORDER — OXYCODONE-ACETAMINOPHEN 5-325 MG PO TABS: 1.0000 | ORAL_TABLET | ORAL | Status: DC | PRN

## 2012-08-16 NOTE — ED Notes (Signed)
Pt with mid chest pain all the time but comes and goes, pt not sure if it is from anxiety

## 2012-08-16 NOTE — ED Notes (Signed)
Passing large blood clots and vaginal bleeding nonstop x 3 weeks, vaginal bleeding for several months, has an appt on Friday with GYN, today has worse pain ever and passed a clot the size of a fist per pt, nurse hotline suggested that pt get evaluated in ED

## 2012-08-16 NOTE — ED Notes (Signed)
Pt presents to ED secondary to vaginal bleeding x 3 weeks. Pt reports passing blood clots.  Pt denies dizziness, N/V at this time. Pt also c/o chest pain/soreness.  NAD noted.

## 2012-08-16 NOTE — ED Provider Notes (Signed)
History  This chart was scribed for Joya Gaskins, MD by Shari Heritage, ED Scribe. The patient was seen in room APA06/APA06. Patient's care was started at 1618.   CSN: [id number]  Arrival date & time 08/16/12  1540   First MD Initiated Contact with Patient 08/16/12 1618      Chief Complaint  Patient presents with  . Vaginal Bleeding  . Chest Pain      Patient is a 42 y.o. female presenting with vaginal bleeding and chest pain. The history is provided by the patient. No language interpreter was used.  Vaginal Bleeding This is a recurrent problem. The current episode started more than 1 week ago. The problem occurs constantly. The problem has not changed since onset.Associated symptoms include chest pain. Pertinent negatives include no shortness of breath. She has tried nothing for the symptoms.  Chest Pain Pain location:  Substernal area Pain radiates to:  Does not radiate Pain radiates to the back: no   Timing:  Intermittent Chronicity:  Recurrent Associated symptoms: no diaphoresis and no shortness of breath     . HPI Comments: Bethany King is a 42 y.o. female who presents to the Emergency Department complaining of persistent vaginal bleeding for the past 3 weeks. She decided to come to the ED today because she passed a large blood clot that patient says is the size of her fist. She had an accompanying episode of severe, suprapubic abdominal cramping with the passing of the clots. She says that she has an appointment scheduled with her GYN on Friday.  Patient is also complaining of recurrent, intermittent, moderate substernal chest pain for the past several days. Patient says that she has been worked up for the same pain before and was diagnosed with costochondritis. She denies any shortness of breath with chest pain episodes. There is no syncope, lightheadedness or diaphoresis associated with chest pain. She denies history of cardiac disease, MI, DVT, or PE. She is not  diabetic.   OB/GYN- [facility]  Past Medical History  Diagnosis Date  . GERD (gastroesophageal reflux disease)   . Irritable bowel syndrome (IBS)   . HTN (hypertension)   . Anxiety   . Depression   . Multiple hemangiomas 2006    hepatic, stable  . Costochondritis   . S/P endoscopy April 2012    mild gastritis, benign gastric polyp  . S/P colonoscopy April 2012    internal hemorrhoids, simple adenoma  . Iron deficiency anemia   . Hepatic hemangioma   . Cervical disc herniation   . Nephrolithiasis 2006    w/ hydronephrosis  . Recurrent UTI   . Medullary sponge kidney     left    Past Surgical History  Procedure Laterality Date  . Carpel tunnel release  03/2010 and 05/2010    bilateral  . Kidney stone surgery      numerous  . Foot surgery      right- plantar fasciotomy  . Esophagogastroduodenoscopy  10/14/10    Family History  Problem Relation Age of Onset  . Irritable bowel syndrome Mother   . Scleroderma Mother   . Rheum arthritis Mother   . Asthma Mother   . Colon cancer Cousin 76    second cousin Nettie Elm [place])  . Melanoma Father   . Cancer Father     Melanoma  . Cancer Maternal Grandmother     Ovarian  . Diabetes Maternal Grandmother   . Thyroid disease Maternal Aunt     History  Substance Use Topics  . Smoking status: Never Smoker   . Smokeless tobacco: Never Used  . Alcohol Use: No    OB History   Grav Para Term Preterm Abortions TAB SAB Ect Mult Living                  Review of Systems  Constitutional: Negative for diaphoresis.  Respiratory: Negative for shortness of breath.   Cardiovascular: Positive for chest pain.  Genitourinary: Positive for vaginal bleeding.  Neurological: Negative for syncope and light-headedness.  All other systems reviewed and are negative.    Allergies  Hydromorphone; Prednisone; Tramadol hcl; and Sulfonamide derivatives  Home Medications   Current Outpatient Rx  Name  Route  Sig  Dispense  Refill   . ALPRAZolam (XANAX) 0.5 MG tablet   Oral   Take 0.5 mg by mouth daily.           . cyclobenzaprine (FLEXERIL) 10 MG tablet   Oral   Take 1 tablet (10 mg total) by mouth every 8 (eight) hours as needed for muscle spasms.   30 tablet   1   . fluticasone (FLONASE) 50 MCG/ACT nasal spray   Nasal   Place 1 spray into the nose daily.   16 g   2   . hydrochlorothiazide (HYDRODIURIL) 25 MG tablet   Oral   Take 1 tablet (25 mg total) by mouth daily.   90 tablet   1   . HYDROcodone-acetaminophen (VICODIN) 5-500 MG per tablet   Oral   Take 1 tablet by mouth every 6 (six) hours as needed for pain.   30 tablet   1   . ibuprofen (ADVIL,MOTRIN) 800 MG tablet   Oral   Take 800 mg by mouth 2 (two) times daily.         Marland Kitchen loratadine (CLARITIN) 10 MG tablet   Oral   Take 10 mg by mouth daily.         . Multiple Vitamins-Minerals (MULTIVITAMIN WITH MINERALS) tablet   Oral   Take 1 tablet by mouth daily.         . Probiotic Product (HEALTHY COLON) CAPS   Oral   Take 1 capsule by mouth daily.           . promethazine (PHENERGAN) 25 MG tablet   Oral   Take 1 tablet (25 mg total) by mouth every 6 (six) hours as needed for nausea.   30 tablet   0   . propranolol (INDERAL) 10 MG tablet   Oral   Take 1 tablet (10 mg total) by mouth 2 (two) times daily.   60 tablet   2   . ranitidine (ZANTAC) 150 MG tablet   Oral   Take 1 tablet (150 mg total) by mouth at bedtime.   30 tablet   2   . SUMAtriptan (IMITREX) 50 MG tablet      Take 1 tablet at onset of headache ,may repeat in 1 hour, no more than 2 tablets daily   10 tablet   3   . Vilazodone HCl (VIIBRYD) 40 MG TABS   Oral   Take 1 tablet by mouth daily.             BP 123/77  Pulse 83  Temp(Src) 97.5 F (36.4 C) (Oral)  Resp 18  Ht 5\' 3"  (1.6 m)  Wt 180 lb (81.647 kg)  BMI 31.89 kg/m2  SpO2 100%  Physical Exam CONSTITUTIONAL: Well developed/well nourished HEAD: Normocephalic/atraumatic  EYES:  EOMI/PERRL ENMT: Mucous membranes moist NECK: supple no meningeal signs SPINE:entire spine nontender CV: S1/S2 noted, no murmurs/rubs/gallops noted LUNGS: Lungs are clear to auscultation bilaterally, no apparent distress CHEST: chest wall tenderness, no crepitance ABDOMEN: soft, nontender, no rebound or guarding GU:no cva tenderness. Small amt of blood noted in vaginal vault.  No clots noted. No active bleeding noted  No cmt noted.  Chaperone present NEURO: Pt is awake/alert, moves all extremitiesx4 EXTREMITIES: pulses normal, full ROM SKIN: warm, color normal PSYCH: no abnormalities of mood noted ED Course  Procedures (including critical care time) DIAGNOSTIC STUDIES: Oxygen Saturation is 100% on room air, normal by my interpretation.    COORDINATION OF CARE: 4:38 PM- Patient informed of current plan for treatment and evaluation and agrees with plan at this time.   For her CP = it is reproducible, not pleuritic or exertional and she reports she gets this frequently.  Do not feel this is an acute issue and I doubt ACS/PE.    For vag bleeding, she has f/u later this week with OBGYN.  HGB is stable.      MDM  Nursing notes including past medical history and social history reviewed and considered in documentation Labs/vital reviewed and considered       Date: 08/16/2012  Rate: 78  Rhythm: normal sinus rhythm  QRS Axis: normal  Intervals: normal  ST/T Wave abnormalities: normal  Conduction Disutrbances:none  Narrative Interpretation:   Old EKG Reviewed: inferior t wave changes improved from prior     I personally performed the services described in this documentation, which was scribed in my presence. The recorded information has been reviewed and is accurate.      Joya Gaskins, MD 08/16/12 [phone]

## 2012-08-17 ENCOUNTER — Other Ambulatory Visit: Payer: Self-pay | Admitting: Family Medicine

## 2012-08-30 ENCOUNTER — Ambulatory Visit (INDEPENDENT_AMBULATORY_CARE_PROVIDER_SITE_OTHER): Payer: 59 | Admitting: Obstetrics & Gynecology

## 2012-08-30 ENCOUNTER — Encounter: Payer: Self-pay | Admitting: Obstetrics & Gynecology

## 2012-08-30 ENCOUNTER — Encounter (HOSPITAL_COMMUNITY): Payer: Self-pay | Admitting: Pharmacy Technician

## 2012-08-30 VITALS — BP 152/90 | Wt 187.0 lb

## 2012-08-30 DIAGNOSIS — N946 Dysmenorrhea, unspecified: Secondary | ICD-10-CM

## 2012-08-30 DIAGNOSIS — N920 Excessive and frequent menstruation with regular cycle: Secondary | ICD-10-CM

## 2012-08-30 NOTE — Patient Instructions (Signed)

## 2012-08-30 NOTE — Progress Notes (Signed)
Patient ID: Bethany King, female   DOB: [birth year], 42 y.o.   MRN: [id number] Preoperative History and Physical  Bethany King is a 42 y.o. G0P0 with Patient's last menstrual period was 08/13/2012. seen for continued heavy menstrual periods with normal hemoglobin.  She was last seen last October for the same and had a normal sonogram.  Placed on megace which she could not tolerate.  Since that time her periods have worsened in both volume and intensity and she wants to proceed with an endometrial ablation.  She understands the ablation with normal uterine anatomy is the most appropriate next step.   It is schedule for 09/07/2012 at [facility].   PMH:    Past Medical History  Diagnosis Date  . GERD (gastroesophageal reflux disease)   . Irritable bowel syndrome (IBS)   . HTN (hypertension)   . Anxiety   . Depression   . Multiple hemangiomas 2006    hepatic, stable  . Costochondritis   . S/P endoscopy April 2012    mild gastritis, benign gastric polyp  . S/P colonoscopy April 2012    internal hemorrhoids, simple adenoma  . Iron deficiency anemia   . Hepatic hemangioma   . Cervical disc herniation   . Nephrolithiasis 2006    w/ hydronephrosis  . Recurrent UTI   . Medullary sponge kidney     left    PSH:     Past Surgical History  Procedure Laterality Date  . Carpel tunnel release  03/2010 and 05/2010    bilateral  . Kidney stone surgery      numerous  . Foot surgery      right- plantar fasciotomy  . Esophagogastroduodenoscopy  10/14/10    POb/GynH:      OB History   Grav Para Term Preterm Abortions TAB SAB Ect Mult Living   0               SH:   History  Substance Use Topics  . Smoking status: Never Smoker   . Smokeless tobacco: Never Used  . Alcohol Use: No    FH:    Family History  Problem Relation Age of Onset  . Irritable bowel syndrome Mother   . Scleroderma Mother   . Rheum arthritis Mother   . Asthma Mother   . Colon cancer Cousin 38    second cousin  Bethany Elm [place])  . Melanoma Father   . Cancer Father     Melanoma  . Cancer Maternal Grandmother     Ovarian  . Diabetes Maternal Grandmother   . Thyroid disease Maternal Aunt      Allergies:  Allergies  Allergen Reactions  . Hydromorphone Itching  . Prednisone     Increase anxiety symptoms, insomnia  . Tramadol Hcl Other (See Comments)    dizziness  . Sulfonamide Derivatives Rash    Medications:      Current outpatient prescriptions:ALPRAZolam (XANAX) 0.5 MG tablet, Take 0.5 mg by mouth 3 (three) times daily as needed for sleep or anxiety. , Disp: , Rfl: ;  hydrochlorothiazide (HYDRODIURIL) 25 MG tablet, TAKE 1 TABLET DAILY FOR HYPERTENSION, Disp: 90 tablet, Rfl: 0;  ibuprofen (ADVIL,MOTRIN) 200 MG tablet, Take 200-400 mg by mouth daily as needed for pain., Disp: , Rfl:  loratadine (CLARITIN) 10 MG tablet, Take 10 mg by mouth daily., Disp: , Rfl: ;  Multiple Vitamins-Minerals (MULTIVITAMIN WITH MINERALS) tablet, Take 1 tablet by mouth daily., Disp: , Rfl: ;  Probiotic Product (HEALTHY COLON) CAPS, Take  1 capsule by mouth daily.  , Disp: , Rfl: ;  Vilazodone HCl (VIIBRYD) 40 MG TABS, Take 40 mg by mouth daily., Disp: , Rfl: ;  fluticasone (FLONASE) 50 MCG/ACT nasal spray, Place 1 spray into the nose., Disp: , Rfl:  SUMAtriptan (IMITREX) 50 MG tablet, Take 1 tablet at onset of headache ,may repeat in 1 hour, no more than 2 tablets daily, Disp: 10 tablet, Rfl: 3;  [DISCONTINUED] omeprazole (PRILOSEC) 20 MG capsule, Take 20 mg by mouth daily.  , Disp: , Rfl:   Review of Systems:   Review of Systems  Constitutional: Negative for fever, chills, weight loss, malaise/fatigue and diaphoresis.  HENT: Negative for hearing loss, ear pain, nosebleeds, congestion, sore throat, neck pain, tinnitus and ear discharge.   Eyes: Negative for blurred vision, double vision, photophobia, pain, discharge and redness.  Respiratory: Negative for cough, hemoptysis, sputum production, shortness of breath,  wheezing and stridor.   Cardiovascular: Negative for chest pain, palpitations, orthopnea, claudication, leg swelling and PND.  Gastrointestinal: Positive for abdominal pain. Negative for heartburn, nausea, vomiting, diarrhea, constipation, blood in stool and melena.  Genitourinary: Negative for dysuria, urgency, frequency, hematuria and flank pain.  Musculoskeletal: Negative for myalgias, back pain, joint pain and falls.  Skin: Negative for itching and rash.  Neurological: Negative for dizziness, tingling, tremors, sensory change, speech change, focal weakness, seizures, loss of consciousness, weakness and headaches.  Endo/Heme/Allergies: Negative for environmental allergies and polydipsia. Does not bruise/bleed easily.  Psychiatric/Behavioral: Negative for depression, suicidal ideas, hallucinations, memory loss and substance abuse. The patient is not nervous/anxious and does not have insomnia.      PHYSICAL EXAM:  Blood pressure 152/90, weight 187 lb (84.823 kg), last menstrual period 08/13/2012.    Vitals reviewed. Constitutional: She is oriented to person, place, and time. She appears well-developed and well-nourished.  HENT:  Head: Normocephalic and atraumatic.  Right Ear: External ear normal.  Left Ear: External ear normal.  Nose: Nose normal.  Mouth/Throat: Oropharynx is clear and moist.  Eyes: Conjunctivae and EOM are normal. Pupils are equal, round, and reactive to light. Right eye exhibits no discharge. Left eye exhibits no discharge. No scleral icterus.  Neck: Normal range of motion. Neck supple. No tracheal deviation present. No thyromegaly present.  Cardiovascular: Normal rate, regular rhythm, normal heart sounds and intact distal pulses.  Exam reveals no gallop and no friction rub.   No murmur heard. Respiratory: Effort normal and breath sounds normal. No respiratory distress. She has no wheezes. She has no rales. She exhibits no tenderness.  GI: Soft. Bowel sounds are  normal. She exhibits no distension and no mass. There is tenderness. There is no rebound and no guarding.  Genitourinary:       Vulva is normal without lesions Vagina is pink moist without discharge Cervix normal in appearance and pap is normal Uterus is normal  y sonogram on 04/07/2012 Adnexa is negative with normal sized ovaries by sonogram  Musculoskeletal: Normal range of motion. She exhibits no edema and no tenderness.  Neurological: She is alert and oriented to person, place, and time. She has normal reflexes. She displays normal reflexes. No cranial nerve deficit. She exhibits normal muscle tone. Coordination normal.  Skin: Skin is warm and dry. No rash noted. No erythema. No pallor.  Psychiatric: She has a normal mood and affect. Her behavior is normal. Judgment and thought content normal.    Labs: Results for orders placed during the hospital encounter of 08/16/12 (from the past 336 hour(s))  POCT PREGNANCY, URINE   Collection Time    08/16/12  4:44 PM      Result Value Range   Preg Test, Ur NEGATIVE  NEGATIVE  POCT I-STAT, CHEM 8   Collection Time    08/16/12  4:57 PM      Result Value Range   Sodium 141  135 - 145 mEq/L   Potassium 3.0 (*) 3.5 - 5.1 mEq/L   Chloride 101  96 - 112 mEq/L   BUN 10  6 - 23 mg/dL   Creatinine, Ser 1.61  0.50 - 1.10 mg/dL   Glucose, Bld 89  70 - 99 mg/dL   Calcium, Ion 0.96 (*) 1.12 - 1.23 mmol/L   TCO2 32  0 - 100 mmol/L   Hemoglobin 11.9 (*) 12.0 - 15.0 g/dL   HCT 04.5 (*) 40.9 - 81.1 %    EKG: Orders placed during the hospital encounter of 08/16/12  . ED EKG  . ED EKG  . EKG 12-LEAD  . EKG 12-LEAD  . EKG    Imaging Studies: No results found.    Assessment:  Menometrorrhagia Dysmenorrhea     Patient Active Problem List  Diagnosis  . GERD  . CONSTIPATION  . IBS (irritable bowel syndrome)  . Epigastric pain  . Chest pain  . Diarrhea  . HTN (hypertension)  . Cystitis  . Anxiety  . Obesity  . Migraine headache   . Recurrent UTI  . Costochondritis  . Shoulder bursitis  . Shoulder pain, left  . Menorrhagia  . Back pain  . Sinusitis  . Viral illness  . Vaginitis  . Sciatica  . Dysmenorrhea    Plan: Proceed with endometrial ablation hysteroscopy and curettage  Bethany King 08/30/2012 2:38 PM

## 2012-09-01 ENCOUNTER — Other Ambulatory Visit: Payer: Self-pay | Admitting: Obstetrics & Gynecology

## 2012-09-01 ENCOUNTER — Encounter (HOSPITAL_COMMUNITY)
Admission: RE | Admit: 2012-09-01 | Discharge: 2012-09-01 | Disposition: A | Discharge status: Home / Self Care | Payer: 59 | Source: Ambulatory Visit | Attending: Obstetrics & Gynecology | Admitting: Obstetrics & Gynecology

## 2012-09-01 ENCOUNTER — Telehealth: Payer: Self-pay | Admitting: Obstetrics & Gynecology

## 2012-09-01 ENCOUNTER — Encounter (HOSPITAL_COMMUNITY): Payer: Self-pay

## 2012-09-01 HISTORY — DX: Other complications of anesthesia, initial encounter: T88.59XA

## 2012-09-01 HISTORY — DX: Adverse effect of unspecified anesthetic, initial encounter: T41.45XA

## 2012-09-01 HISTORY — DX: Nausea with vomiting, unspecified: R11.2

## 2012-09-01 HISTORY — DX: Other specified postprocedural states: Z98.890

## 2012-09-01 LAB — URINALYSIS, ROUTINE W REFLEX MICROSCOPIC
Nitrite: NEGATIVE
Specific Gravity, Urine: <1.005 — ABNORMAL LOW (ref 1.005–1.030)
Urobilinogen, UA: 0.2 mg/dL (ref 0.0–1.0)

## 2012-09-01 LAB — COMPREHENSIVE METABOLIC PANEL
AST: 19 U/L (ref 0–37)
CO2: 27 mEq/L (ref 19–32)
Calcium: 9.3 mg/dL (ref 8.4–10.5)
Creatinine, Ser: 0.77 mg/dL (ref 0.50–1.10)
GFR calc non Af Amer: >90 mL/min (ref >90)

## 2012-09-01 LAB — CBC
MCH: 28 pg (ref 26.0–34.0)
MCHC: 32.9 g/dL (ref 30.0–36.0)
MCV: 85.1 fL (ref 78.0–100.0)
Platelets: 346 10*3/uL (ref 150–400)
RBC: 4.03 MIL/uL (ref 3.87–5.11)
RDW: 14.2 % (ref 11.5–15.5)

## 2012-09-01 LAB — SURGICAL PCR SCREEN: Staphylococcus aureus: NEGATIVE

## 2012-09-01 LAB — URINE MICROSCOPIC-ADD ON

## 2012-09-01 LAB — HCG, QUANTITATIVE, PREGNANCY: hCG, Beta Chain, Quant, S: <1 m[IU]/mL (ref <5)

## 2012-09-01 NOTE — Telephone Encounter (Signed)
Patient's pre operative K is 3.1.  i am placing her on replacement 3 times a day until surgery. EURE,LUTHER H 09/01/2012 10:07 PM

## 2012-09-01 NOTE — Patient Instructions (Addendum)
Bethany King  09/01/2012   Your procedure is scheduled on:  09/07/12  Report to [facility] at 8:45 AM.  Call this number if you have problems the morning of surgery: [phone]   Remember:   Do not eat food or drink liquids after midnight.   Take these medicines the morning of surgery with A SIP OF WATER: xanax   Do not wear jewelry, make-up or nail polish.  Do not wear lotions, powders, or perfumes. You may wear deodorant.  Do not shave 48 hours prior to surgery. Men may shave face and neck.  Do not bring valuables to the hospital.  Contacts, dentures or bridgework may not be worn into surgery.  Leave suitcase in the car. After surgery it may be brought to your room.  For patients admitted to the hospital, checkout time is 11:00 AM the day of  discharge.   Patients discharged the day of surgery will not be allowed to drive  home.  Name and phone number of your driver:   Special Instructions: Shower using CHG 2 nights before surgery and the night before surgery.  If you shower the day of surgery use CHG.  Use special wash - you have one bottle of CHG for all showers.  You should use approximately 1/3 of the bottle for each shower.   Please read over the following fact sheets that you were given: Pain Booklet, MRSA Information, Surgical Site Infection Prevention, Anesthesia Post-op Instructions and Care and Recovery After Surgery  Hysteroscopy Hysteroscopy is a procedure used for looking inside the womb (uterus). It may be done for many different reasons, including:  To evaluate abnormal bleeding, fibroid (benign, noncancerous) tumors, polyps, scar tissue (adhesions), and possibly cancer of the uterus.  To look for lumps (tumors) and other uterine growths.  To look for causes of why a woman cannot get pregnant (infertility), causes of recurrent loss of pregnancy (miscarriages), or a lost intrauterine device (IUD).  To perform a sterilization by blocking the fallopian tubes from  inside the uterus. A hysteroscopy should be done right after a menstrual period to be sure you are not pregnant. LET YOUR CAREGIVER KNOW ABOUT:   Allergies.  Medicines taken, including herbs, eyedrops, over-the-counter medicines, and creams.  Use of steroids (by mouth or creams).  Previous problems with anesthetics or numbing medicines.  History of bleeding or blood problems.  History of blood clots.  Possibility of pregnancy, if this applies.  Previous surgery.  Other health problems. RISKS AND COMPLICATIONS   Putting a hole in the uterus.  Excessive bleeding.  Infection.  Damage to the cervix.  Injury to other organs.  Allergic reaction to medicines.  Too much fluid used in the uterus for the procedure. BEFORE THE PROCEDURE   Do not take aspirin or blood thinners for a week before the procedure, or as directed. It can cause bleeding.  Arrive at least 60 minutes before the procedure or as directed to read and sign the necessary forms.  Arrange for someone to take you home after the procedure.  If you smoke, do not smoke for 2 weeks before the procedure. PROCEDURE   Your caregiver may give you medicine to relax you. He or she may also give you a medicine that numbs the area around the cervix (local anesthetic) or a medicine that makes you sleep (general anesthesia).  Sometimes, a medicine is placed in the cervix the day before the procedure. This medicine makes the cervix have a larger opening (dilate).  This makes it easier for the instrument to be inserted into the uterus.  A small instrument (hysteroscope) is inserted through the vagina into the uterus. This instrument is similar to a pencil-sized telescope with a light.  During the procedure, air or a liquid is put into the uterus, which allows the surgeon to see better.  Sometimes, tissue is gently scraped from inside the uterus. These tissue samples are sent to a specialist who looks at tissue samples  (pathologist). The pathologist will give a report to your caregiver. This will help your caregiver decide if further treatment is necessary. The report will also help your caregiver decide on the best treatment if the test comes back abnormal. AFTER THE PROCEDURE   If you had a general anesthetic, you may be groggy for a couple hours after the procedure.  If you had a local anesthetic, you will be advised to rest at the surgical center or caregiver's office until you are stable and feel ready to go home.  You may have some cramping for a couple days.  You may have bleeding, which varies from light spotting for a few days to menstrual-like bleeding for up to 3 to 7 days. This is normal.  Have someone take you home. FINDING OUT THE RESULTS OF YOUR TEST Not all test results are available during your visit. If your test results are not back during the visit, make an appointment with your caregiver to find out the results. Do not assume everything is normal if you have not heard from your caregiver or the medical facility. It is important for you to follow up on all of your test results. HOME CARE INSTRUCTIONS   Do not drive for 24 hours or as instructed.  Only take over-the-counter or prescription medicines for pain, discomfort, or fever as directed by your caregiver.  Do not take aspirin. It can cause or aggravate bleeding.  Do not drive or drink alcohol while taking pain medicine.  You may resume your usual diet.  Do not use tampons, douche, or have sexual intercourse for 2 weeks, or as advised by your caregiver.  Rest and sleep for the first 24 to 48 hours.  Take your temperature twice a day for 4 to 5 days. Write it down. Give these temperatures to your caregiver if they are abnormal (above 98.6 F or 37.0 C).  Take medicines your caregiver has ordered as directed.  Follow your caregiver's advice regarding diet, exercise, lifting, driving, and general activities.  Take showers  instead of baths for 2 weeks, or as recommended by your caregiver.  If you develop constipation:  Take a mild laxative with the advice of your caregiver.  Eat bran foods.  Drink enough water and fluids to keep your urine clear or pale yellow.  Try to have someone with you or available to you for the first 24 to 48 hours, especially if you had a general anesthetic.  Make sure you and your family understand everything about your operation and recovery.  Follow your caregiver's advice regarding follow-up appointments and Pap smears. SEEK MEDICAL CARE IF:   You feel dizzy or lightheaded.  You feel sick to your stomach (nauseous).  You develop abnormal vaginal discharge.  You develop a rash.  You have an abnormal reaction or allergy to your medicine.  You need stronger pain medicine. SEEK IMMEDIATE MEDICAL CARE IF:   Bleeding is heavier than a normal menstrual period or you have blood clots.  You have an oral temperature  above 102 F (38.9 C), not controlled by medicine.  You have increasing cramps or pains not relieved with medicine.  You develop belly (abdominal) pain that does not seem to be related to the same area of earlier cramping and pain.  You pass out.  You develop pain in the tops of your shoulders (shoulder strap areas).  You develop shortness of breath. MAKE SURE YOU:   Understand these instructions.  Will watch your condition.  Will get help right away if you are not doing well or get worse. Document Released: 09/07/2000 Document Revised: 08/24/2011 Document Reviewed: 12/31/2008 [facility] Patient Information 2013 [place], [place]. Endometrial Ablation Endometrial ablation removes the lining of the uterus (endometrium). It is usually a same day, outpatient treatment. Ablation helps avoid major surgery (such as a hysterectomy). A hysterectomy is removal of the cervix and uterus. Endometrial ablation has less risk and complications, has a shorter recovery  period and is less expensive. After endometrial ablation, most women will have little or no menstrual bleeding. You may not keep your fertility. Pregnancy is no longer likely after this procedure but if you are pre-menopausal, you still need to use a reliable method of birth control following the procedure because pregnancy can occur. REASONS TO HAVE THE PROCEDURE MAY INCLUDE:  Heavy periods.  Bleeding that is causing anemia.  Anovulatory bleeding, very irregular, bleeding.  Bleeding submucous fibroids (on the lining inside the uterus) if they are smaller than 3 centimeters. REASONS NOT TO HAVE THE PROCEDURE MAY INCLUDE:  You wish to have more children.  You have a pre-cancerous or cancerous problem. The cause of any abnormal bleeding must be diagnosed before having the procedure.  You have pain coming from the uterus.  You have a submucus fibroid larger than 3 centimeters.  You recently had a baby.  You recently had an infection in the uterus.  You have a severe retro-flexed, tipped uterus and cannot insert the instrument to do the ablation.  You had a Cesarean section or deep major surgery on the uterus.  The inner cavity of the uterus is too large for the endometrial ablation instrument. RISKS AND COMPLICATIONS   Perforation of the uterus.  Bleeding.  Infection of the uterus, bladder or vagina.  Injury to surrounding organs.  Cutting the cervix.  An air bubble to the lung (air embolus).  Pregnancy following the procedure.  Failure of the procedure to help the problem requiring hysterectomy.  Decreased ability to diagnose cancer in the lining of the uterus. BEFORE THE PROCEDURE  The lining of the uterus must be tested to make sure there is no pre-cancerous or cancer cells present.  Medications may be given to make the lining of the uterus thinner.  Ultrasound may be used to evaluate the size and look for abnormalities of the uterus.  Future pregnancy is not  desired. PROCEDURE  There are different ways to destroy the lining of the uterus.   Resectoscope - radio frequency-alternating electric current is the most common one used.  Cryotherapy - freezing the lining of the uterus.  Heated Free Liquid - heated salt (saline) solution inserted into the uterus.  Microwave - uses high energy microwaves in the uterus.  Thermal Balloon - a catheter with a balloon tip is inserted into the uterus and filled with heated fluid. Your caregiver will talk with you about the method used in this clinic. They will also instruct you on the pros and cons of the procedure. Endometrial ablation is performed along with a  procedure called operative hysteroscopy. A narrow viewing tube is inserted through the birth canal (vagina) and through the cervix into the uterus. A tiny camera attached to the viewing tube (hysteroscope) allows the uterine cavity to be shown on a TV monitor during surgery. Your uterus is filled with a harmless liquid to make the procedure easier. The lining of the uterus is then removed. The lining can also be removed with a resectoscope which allows your surgeon to cut away the lining of the uterus under direct vision. Usually, you will be able to go home within an hour after the procedure. HOME CARE INSTRUCTIONS   Do not drive for 24 hours.  No tampons, douching or intercourse for 2 weeks or until your caregiver approves.  Rest at home for 24 to 48 hours. You may then resume normal activities unless told differently by your caregiver.  Take your temperature two times a day for 4 days, and record it.  Take any medications your caregiver has ordered, as directed.  Use some form of contraception if you are pre-menopausal and do not want to get pregnant. Bleeding after the procedure is normal. It varies from light spotting and mildly watery to bloody discharge for 4 to 6 weeks. You may also have mild cramping. Only take over-the-counter or prescription  medicines for pain, discomfort, or fever as directed by your caregiver. Do not use aspirin, as this may aggravate bleeding. Frequent urination during the first 24 hours is normal. You will not know how effective your surgery is until at least 3 months after the surgery. SEEK IMMEDIATE MEDICAL CARE IF:   Bleeding is heavier than a normal menstrual cycle.  An oral temperature above 102 F (38.9 C) develops.  You have increasing cramps or pains not relieved with medication or develop belly (abdominal) pain which does not seem to be related to the same area of earlier cramping and pain.  You are light headed, weak or have fainting episodes.  You develop pain in the shoulder strap areas.  You have chest or leg pain.  You have abnormal vaginal discharge.  You have painful urination. Document Released: 04/10/2004 Document Revised: 08/24/2011 Document Reviewed: 07/09/2007 [facility] Patient Information 2013 [place], [place].

## 2012-09-02 NOTE — Telephone Encounter (Signed)
Pt contacted ,potassium 3.1 ,advise take OTC potassum 1 tab by mouth three time a day.

## 2012-09-02 NOTE — Pre-Procedure Instructions (Signed)
Dr Jayme Cloud and Dr Despina Hidden notified of K 3.1.  Istat AM of surgery

## 2012-09-07 ENCOUNTER — Encounter (HOSPITAL_COMMUNITY): Payer: Self-pay | Admitting: Anesthesiology

## 2012-09-07 ENCOUNTER — Ambulatory Visit (HOSPITAL_COMMUNITY)
Admission: RE | Admit: 2012-09-07 | Discharge: 2012-09-07 | Disposition: A | Discharge status: Home / Self Care | Payer: 59 | Source: Ambulatory Visit | Attending: Obstetrics & Gynecology | Admitting: Obstetrics & Gynecology

## 2012-09-07 ENCOUNTER — Ambulatory Visit (HOSPITAL_COMMUNITY): Payer: 59 | Admitting: Anesthesiology

## 2012-09-07 ENCOUNTER — Encounter (HOSPITAL_COMMUNITY): Payer: Self-pay | Admitting: *Deleted

## 2012-09-07 ENCOUNTER — Encounter (HOSPITAL_COMMUNITY)
Admission: RE | Disposition: A | Discharge status: Home / Self Care | Payer: Self-pay | Source: Ambulatory Visit | Attending: Obstetrics & Gynecology

## 2012-09-07 DIAGNOSIS — N946 Dysmenorrhea, unspecified: Secondary | ICD-10-CM | POA: Insufficient documentation

## 2012-09-07 DIAGNOSIS — N920 Excessive and frequent menstruation with regular cycle: Secondary | ICD-10-CM | POA: Insufficient documentation

## 2012-09-07 DIAGNOSIS — Z01812 Encounter for preprocedural laboratory examination: Secondary | ICD-10-CM | POA: Insufficient documentation

## 2012-09-07 DIAGNOSIS — Z9889 Other specified postprocedural states: Secondary | ICD-10-CM

## 2012-09-07 DIAGNOSIS — I10 Essential (primary) hypertension: Secondary | ICD-10-CM | POA: Insufficient documentation

## 2012-09-07 HISTORY — PX: DILITATION & CURRETTAGE/HYSTROSCOPY WITH THERMACHOICE ABLATION: SHX5569

## 2012-09-07 LAB — POCT I-STAT 4, (NA,K, GLUC, HGB,HCT)
Potassium: 3.1 mEq/L — ABNORMAL LOW (ref 3.5–5.1)
Sodium: 139 mEq/L (ref 135–145)

## 2012-09-07 SURGERY — DILATATION & CURETTAGE/HYSTEROSCOPY WITH THERMACHOICE ABLATION
Anesthesia: General | Wound class: Clean Contaminated

## 2012-09-07 MED ORDER — CEFAZOLIN SODIUM-DEXTROSE 2-3 GM-% IV SOLR: 2.0000 g | INTRAVENOUS | Status: DC

## 2012-09-07 MED ORDER — LACTATED RINGERS IV SOLN
INTRAVENOUS | Status: DC | PRN
  Administered 2012-09-07: 10:00:00 via INTRAVENOUS

## 2012-09-07 MED ORDER — SCOPOLAMINE 1 MG/3DAYS TD PT72
1.0000 | MEDICATED_PATCH | Freq: Once | TRANSDERMAL | Status: DC
  Administered 2012-09-07: 1.5 mg via TRANSDERMAL

## 2012-09-07 MED ORDER — FENTANYL CITRATE 0.05 MG/ML IJ SOLN
INTRAMUSCULAR | Status: DC | PRN
  Administered 2012-09-07 (×2): 100 ug via INTRAVENOUS
  Administered 2012-09-07: 50 ug via INTRAVENOUS

## 2012-09-07 MED ORDER — DEXAMETHASONE SODIUM PHOSPHATE 4 MG/ML IJ SOLN
INTRAMUSCULAR | Status: AC
  Filled 2012-09-07: qty 1

## 2012-09-07 MED ORDER — DEXAMETHASONE SODIUM PHOSPHATE 4 MG/ML IJ SOLN
4.0000 mg | Freq: Once | INTRAMUSCULAR | Status: AC
Start: 2012-09-07 — End: 2012-09-07
  Administered 2012-09-07: 4 mg via INTRAVENOUS

## 2012-09-07 MED ORDER — OXYCODONE-ACETAMINOPHEN 7.5-325 MG PO TABS: 1.0000 | ORAL_TABLET | Freq: Four times a day (QID) | ORAL | Status: DC | PRN

## 2012-09-07 MED ORDER — CEFAZOLIN SODIUM-DEXTROSE 2-3 GM-% IV SOLR
INTRAVENOUS | Status: DC | PRN
  Administered 2012-09-07: 2 g via INTRAVENOUS

## 2012-09-07 MED ORDER — KETOROLAC TROMETHAMINE 10 MG PO TABS: 10.0000 mg | ORAL_TABLET | Freq: Three times a day (TID) | ORAL | Status: DC | PRN

## 2012-09-07 MED ORDER — MIDAZOLAM HCL 2 MG/2ML IJ SOLN
INTRAMUSCULAR | Status: AC
  Filled 2012-09-07: qty 2

## 2012-09-07 MED ORDER — FENTANYL CITRATE 0.05 MG/ML IJ SOLN
INTRAMUSCULAR | Status: AC
  Filled 2012-09-07: qty 5

## 2012-09-07 MED ORDER — SUCCINYLCHOLINE CHLORIDE 20 MG/ML IJ SOLN
INTRAMUSCULAR | Status: AC
  Filled 2012-09-07: qty 1

## 2012-09-07 MED ORDER — ROCURONIUM BROMIDE 50 MG/5ML IV SOLN
INTRAVENOUS | Status: AC
  Filled 2012-09-07: qty 1

## 2012-09-07 MED ORDER — ONDANSETRON HCL 4 MG/2ML IJ SOLN
INTRAMUSCULAR | Status: AC
  Filled 2012-09-07: qty 2

## 2012-09-07 MED ORDER — ONDANSETRON HCL 4 MG/2ML IJ SOLN
4.0000 mg | Freq: Once | INTRAMUSCULAR | Status: AC
Start: 2012-09-07 — End: 2012-09-07
  Administered 2012-09-07: 4 mg via INTRAVENOUS

## 2012-09-07 MED ORDER — GLYCOPYRROLATE 0.2 MG/ML IJ SOLN
INTRAMUSCULAR | Status: DC | PRN
  Administered 2012-09-07: 0.6 mg via INTRAVENOUS
  Administered 2012-09-07: 0.2 mg via INTRAVENOUS

## 2012-09-07 MED ORDER — FENTANYL CITRATE 0.05 MG/ML IJ SOLN
INTRAMUSCULAR | Status: AC
  Filled 2012-09-07: qty 2

## 2012-09-07 MED ORDER — ONDANSETRON HCL 4 MG/2ML IJ SOLN: 4.0000 mg | Freq: Once | INTRAMUSCULAR | Status: DC | PRN

## 2012-09-07 MED ORDER — FENTANYL CITRATE 0.05 MG/ML IJ SOLN
25.0000 ug | INTRAMUSCULAR | Status: DC | PRN
  Administered 2012-09-07: 25 ug via INTRAVENOUS
  Administered 2012-09-07 (×2): 50 ug via INTRAVENOUS
  Administered 2012-09-07: 25 ug via INTRAVENOUS
  Administered 2012-09-07: 50 ug via INTRAVENOUS

## 2012-09-07 MED ORDER — MIDAZOLAM HCL 2 MG/2ML IJ SOLN
1.0000 mg | INTRAMUSCULAR | Status: DC | PRN
  Administered 2012-09-07 (×2): 2 mg via INTRAVENOUS

## 2012-09-07 MED ORDER — NEOSTIGMINE METHYLSULFATE 1 MG/ML IJ SOLN
INTRAMUSCULAR | Status: DC | PRN
  Administered 2012-09-07: 4 mg via INTRAVENOUS

## 2012-09-07 MED ORDER — KETOROLAC TROMETHAMINE 30 MG/ML IJ SOLN
30.0000 mg | Freq: Once | INTRAMUSCULAR | Status: AC
  Administered 2012-09-07: 30 mg via INTRAVENOUS

## 2012-09-07 MED ORDER — KETOROLAC TROMETHAMINE 30 MG/ML IJ SOLN
INTRAMUSCULAR | Status: AC
  Filled 2012-09-07: qty 1

## 2012-09-07 MED ORDER — LACTATED RINGERS IV SOLN
INTRAVENOUS | Status: DC
  Administered 2012-09-07: 10:00:00 via INTRAVENOUS

## 2012-09-07 MED ORDER — ROCURONIUM BROMIDE 100 MG/10ML IV SOLN
INTRAVENOUS | Status: DC | PRN
  Administered 2012-09-07: 30 mg via INTRAVENOUS

## 2012-09-07 MED ORDER — SCOPOLAMINE 1 MG/3DAYS TD PT72
MEDICATED_PATCH | TRANSDERMAL | Status: AC
  Filled 2012-09-07: qty 1

## 2012-09-07 MED ORDER — LIDOCAINE HCL (CARDIAC) 10 MG/ML IV SOLN
INTRAVENOUS | Status: DC | PRN
  Administered 2012-09-07: 50 mg via INTRAVENOUS

## 2012-09-07 MED ORDER — DEXTROSE 5 % IV SOLN
INTRAVENOUS | Status: DC | PRN
  Administered 2012-09-07: 21 mL

## 2012-09-07 MED ORDER — CEFAZOLIN SODIUM-DEXTROSE 2-3 GM-% IV SOLR
INTRAVENOUS | Status: AC
  Filled 2012-09-07: qty 50

## 2012-09-07 MED ORDER — PROPOFOL 10 MG/ML IV EMUL
INTRAVENOUS | Status: DC | PRN
  Administered 2012-09-07: 150 mg via INTRAVENOUS

## 2012-09-07 MED ORDER — SODIUM CHLORIDE 0.9 % IR SOLN
Status: DC | PRN
  Administered 2012-09-07: 3000 mL

## 2012-09-07 MED ORDER — ONDANSETRON HCL 8 MG PO TABS: 8.0000 mg | ORAL_TABLET | Freq: Three times a day (TID) | ORAL | Status: DC | PRN

## 2012-09-07 SURGICAL SUPPLY — 29 items
BAG DECANTER FOR FLEXI CONT (MISCELLANEOUS) ×2 IMPLANT
BAG HAMPER (MISCELLANEOUS) ×2 IMPLANT
CATH THERMACHOICE III (CATHETERS) ×2 IMPLANT
CLOTH BEACON ORANGE TIMEOUT ST (SAFETY) ×2 IMPLANT
COVER LIGHT HANDLE STERIS (MISCELLANEOUS) ×4 IMPLANT
FORMALIN 10 PREFIL 120ML (MISCELLANEOUS) ×2 IMPLANT
GAUZE SPONGE 4X4 16PLY XRAY LF (GAUZE/BANDAGES/DRESSINGS) ×2 IMPLANT
GLOVE BIOGEL PI IND STRL 8 (GLOVE) ×1 IMPLANT
GLOVE BIOGEL PI INDICATOR 8 (GLOVE) ×1
GLOVE ECLIPSE 6.5 STRL STRAW (GLOVE) ×1 IMPLANT
GLOVE ECLIPSE 8.0 STRL XLNG CF (GLOVE) ×2 IMPLANT
GLOVE EXAM NITRILE MD LF STRL (GLOVE) ×1 IMPLANT
GLOVE INDICATOR 7.0 STRL GRN (GLOVE) ×1 IMPLANT
GOWN STRL REIN XL XLG (GOWN DISPOSABLE) ×4 IMPLANT
INST SET HYSTEROSCOPY (KITS) ×2 IMPLANT
IV D5W 500ML (IV SOLUTION) ×2 IMPLANT
IV NS IRRIG 3000ML ARTHROMATIC (IV SOLUTION) ×2 IMPLANT
KIT ROOM TURNOVER APOR (KITS) ×2 IMPLANT
MANIFOLD NEPTUNE II (INSTRUMENTS) ×2 IMPLANT
MARKER SKIN DUAL TIP RULER LAB (MISCELLANEOUS) ×2 IMPLANT
NS IRRIG 1000ML POUR BTL (IV SOLUTION) ×2 IMPLANT
PACK BASIC III (CUSTOM PROCEDURE TRAY) ×2
PACK SRG BSC III STRL LF ECLPS (CUSTOM PROCEDURE TRAY) ×1 IMPLANT
PAD ARMBOARD 7.5X6 YLW CONV (MISCELLANEOUS) ×2 IMPLANT
PAD TELFA 3X4 1S STER (GAUZE/BANDAGES/DRESSINGS) ×2 IMPLANT
SET BASIN LINEN APH (SET/KITS/TRAYS/PACK) ×2 IMPLANT
SET IRRIG Y TYPE TUR BLADDER L (SET/KITS/TRAYS/PACK) ×2 IMPLANT
SHEET LAVH (DRAPES) ×2 IMPLANT
YANKAUER SUCT BULB TIP 10FT TU (MISCELLANEOUS) ×2 IMPLANT

## 2012-09-07 NOTE — Op Note (Signed)
Preoperative diagnosis: Menometrorrhagia                                        Dysmenorrhea   Postoperative diagnoses: Same as above   Procedure: Hysteroscopy, uterine curettage, endometrial ablation  Surgeon: Despina Hidden MD  Anesthesia: Laryngeal mask airway  Findings: The endometrium was normal. There were no fibroid or other abnormalities.  Description of operation: The patient was taken to the operating room and placed in the supine position. She underwent general anesthesia using the laryngeal mask airway. She was placed in the dorsal lithotomy position and prepped and draped in the usual sterile fashion. A Graves speculum was placed and the anterior cervical lip was grasped with a single-tooth tenaculum. The cervix was dilated serially to allow passage of the hysteroscope. Diagnostic hysteroscopy was performed and was found to be normal. A vigorous uterine curettage was then performed and all tissue sent to pathology for evaluation. The ThermaChoice 3 endometrial ablation balloon was then used were 21 cc of D5W was required to maintain a pressure of 190-200 mm of mercury throughout the procedure. Total therapy time was 9:47.  All of the equipment worked well throughout the procedure. All of the fluid was returned at the end of the procedure. The patient was awakened from anesthesia and taken to the recovery room in good stable condition all counts were correct. She received 2 g of Ancef and 30 mg of Toradol preoperatively. She will be discharged from the recovery room and followed up in the office in 1- 2 weeks.  Rishon Thilges H 09/07/2012 11:36 AM

## 2012-09-07 NOTE — Transfer of Care (Signed)
Immediate Anesthesia Transfer of Care Note  Patient: Bethany King  Procedure(s) Performed: Procedure(s): DILATATION & CURETTAGE/HYSTEROSCOPY WITH THERMACHOICE ABLATION (N/A)  Patient Location: PACU  Anesthesia Type:General  Level of Consciousness: awake, alert , oriented and patient cooperative  Airway & Oxygen Therapy: Patient Spontanous Breathing and Patient connected to face mask oxygen  Post-op Assessment: Report given to PACU RN and Post -op Vital signs reviewed and stable  Post vital signs: Reviewed and stable  Complications: No apparent anesthesia complications

## 2012-09-07 NOTE — Preoperative (Signed)
Beta Blockers   Reason not to administer Beta Blockers:Not Applicable 

## 2012-09-07 NOTE — Anesthesia Postprocedure Evaluation (Signed)
  Anesthesia Post-op Note  Patient: Bethany King  Procedure(s) Performed: Procedure(s): DILATATION & CURETTAGE/HYSTEROSCOPY WITH THERMACHOICE ABLATION (N/A)  Patient Location: PACU  Anesthesia Type:General  Level of Consciousness: awake, alert , oriented and patient cooperative  Airway and Oxygen Therapy: Patient Spontanous Breathing and Patient connected to face mask oxygen  Post-op Pain: mild  Post-op Assessment: Post-op Vital signs reviewed, Patient's Cardiovascular Status Stable, Respiratory Function Stable and Patent Airway  Post-op Vital Signs: Reviewed and stable  Complications: No apparent anesthesia complications

## 2012-09-07 NOTE — Anesthesia Preprocedure Evaluation (Signed)
Anesthesia Evaluation  Patient identified by MRN, date of birth, ID band Patient awake    Reviewed: Allergy & Precautions, H&P , NPO status , Patient's Chart, lab work & pertinent test results  History of Anesthesia Complications (+) PONV  Airway Mallampati: I TM Distance: >3 FB     Dental  (+) Teeth Intact   Pulmonary neg pulmonary ROS,  breath sounds clear to auscultation        Cardiovascular hypertension, Pt. on medications Rhythm:Regular Rate:Normal     Neuro/Psych  Headaches, PSYCHIATRIC DISORDERS Anxiety Depression  Neuromuscular disease    GI/Hepatic GERD-  Medicated and Controlled,  Endo/Other    Renal/GU      Musculoskeletal   Abdominal   Peds  Hematology   Anesthesia Other Findings   Reproductive/Obstetrics                           Anesthesia Physical Anesthesia Plan  ASA: II  Anesthesia Plan: General   Post-op Pain Management:    Induction: Intravenous, Rapid sequence and Cricoid pressure planned  Airway Management Planned: Oral ETT  Additional Equipment:   Intra-op Plan:   Post-operative Plan: Extubation in OR  Informed Consent: I have reviewed the patients History and Physical, chart, labs and discussed the procedure including the risks, benefits and alternatives for the proposed anesthesia with the patient or authorized representative who has indicated his/her understanding and acceptance.     Plan Discussed with:   Anesthesia Plan Comments:         Anesthesia Quick Evaluation

## 2012-09-07 NOTE — H&P (Signed)
Preoperative History and Physical  Bethany King is a 42 y.o. G0P0 with Patient's last menstrual period was 08/13/2012. seen for continued heavy menstrual periods with normal hemoglobin. She was last seen last October for the same and had a normal sonogram. Placed on megace which she could not tolerate. Since that time her periods have worsened in both volume and intensity and she wants to proceed with an endometrial ablation. She understands the ablation with normal uterine anatomy is the most appropriate next step.  It is schedule for 09/07/2012 at [facility].  PMH:  Past Medical History   Diagnosis  Date   .  GERD (gastroesophageal reflux disease)    .  Irritable bowel syndrome (IBS)    .  HTN (hypertension)    .  Anxiety    .  Depression    .  Multiple hemangiomas  2006     hepatic, stable   .  Costochondritis    .  S/P endoscopy  April 2012     mild gastritis, benign gastric polyp   .  S/P colonoscopy  April 2012     internal hemorrhoids, simple adenoma   .  Iron deficiency anemia    .  Hepatic hemangioma    .  Cervical disc herniation    .  Nephrolithiasis  2006     w/ hydronephrosis   .  Recurrent UTI    .  Medullary sponge kidney      left   PSH:  Past Surgical History   Procedure  Laterality  Date   .  Carpel tunnel release   03/2010 and 05/2010     bilateral   .  Kidney stone surgery       numerous   .  Foot surgery       right- plantar fasciotomy   .  Esophagogastroduodenoscopy   10/14/10   POb/GynH:  OB History    Grav  Para  Term  Preterm  Abortions  TAB  SAB  Ect  Mult  Living    0              SH:  History   Substance Use Topics   .  Smoking status:  Never Smoker   .  Smokeless tobacco:  Never Used   .  Alcohol Use:  No   FH:  Family History   Problem  Relation  Age of Onset   .  Irritable bowel syndrome  Mother    .  Scleroderma  Mother    .  Rheum arthritis  Mother    .  Asthma  Mother    .  Colon cancer  Cousin  53     second cousin Nettie Elm  [place])   .  Melanoma  Father    .  Cancer  Father      Melanoma   .  Cancer  Maternal Grandmother      Ovarian   .  Diabetes  Maternal Grandmother    .  Thyroid disease  Maternal Aunt    Allergies:  Allergies   Allergen  Reactions   .  Hydromorphone  Itching   .  Prednisone      Increase anxiety symptoms, insomnia   .  Tramadol Hcl  Other (See Comments)     dizziness   .  Sulfonamide Derivatives  Rash   Medications: Current outpatient prescriptions:ALPRAZolam (XANAX) 0.5 MG tablet, Take 0.5 mg by mouth 3 (three) times daily as needed for sleep  or anxiety. , Disp: , Rfl: ; hydrochlorothiazide (HYDRODIURIL) 25 MG tablet, TAKE 1 TABLET DAILY FOR HYPERTENSION, Disp: 90 tablet, Rfl: 0; ibuprofen (ADVIL,MOTRIN) 200 MG tablet, Take 200-400 mg by mouth daily as needed for pain., Disp: , Rfl:  loratadine (CLARITIN) 10 MG tablet, Take 10 mg by mouth daily., Disp: , Rfl: ; Multiple Vitamins-Minerals (MULTIVITAMIN WITH MINERALS) tablet, Take 1 tablet by mouth daily., Disp: , Rfl: ; Probiotic Product (HEALTHY COLON) CAPS, Take 1 capsule by mouth daily. , Disp: , Rfl: ; Vilazodone HCl (VIIBRYD) 40 MG TABS, Take 40 mg by mouth daily., Disp: , Rfl: ; fluticasone (FLONASE) 50 MCG/ACT nasal spray, Place 1 spray into the nose., Disp: , Rfl:  SUMAtriptan (IMITREX) 50 MG tablet, Take 1 tablet at onset of headache ,may repeat in 1 hour, no more than 2 tablets daily, Disp: 10 tablet, Rfl: 3; [DISCONTINUED] omeprazole (PRILOSEC) 20 MG capsule, Take 20 mg by mouth daily. , Disp: , Rfl:  Review of Systems:  Review of Systems  Constitutional: Negative for fever, chills, weight loss, malaise/fatigue and diaphoresis.  HENT: Negative for hearing loss, ear pain, nosebleeds, congestion, sore throat, neck pain, tinnitus and ear discharge.  Eyes: Negative for blurred vision, double vision, photophobia, pain, discharge and redness.  Respiratory: Negative for cough, hemoptysis, sputum production, shortness of breath,  wheezing and stridor.  Cardiovascular: Negative for chest pain, palpitations, orthopnea, claudication, leg swelling and PND.  Gastrointestinal: Positive for abdominal pain. Negative for heartburn, nausea, vomiting, diarrhea, constipation, blood in stool and melena.  Genitourinary: Negative for dysuria, urgency, frequency, hematuria and flank pain.  Musculoskeletal: Negative for myalgias, back pain, joint pain and falls.  Skin: Negative for itching and rash.  Neurological: Negative for dizziness, tingling, tremors, sensory change, speech change, focal weakness, seizures, loss of consciousness, weakness and headaches.  Endo/Heme/Allergies: Negative for environmental allergies and polydipsia. Does not bruise/bleed easily.  Psychiatric/Behavioral: Negative for depression, suicidal ideas, hallucinations, memory loss and substance abuse. The patient is not nervous/anxious and does not have insomnia.  PHYSICAL EXAM:  Blood pressure 152/90, weight 187 lb (84.823 kg), last menstrual period 08/13/2012.  Vitals reviewed.  Constitutional: She is oriented to person, place, and time. She appears well-developed and well-nourished.  HENT:  Head: Normocephalic and atraumatic.  Right Ear: External ear normal.  Left Ear: External ear normal.  Nose: Nose normal.  Mouth/Throat: Oropharynx is clear and moist.  Eyes: Conjunctivae and EOM are normal. Pupils are equal, round, and reactive to light. Right eye exhibits no discharge. Left eye exhibits no discharge. No scleral icterus.  Neck: Normal range of motion. Neck supple. No tracheal deviation present. No thyromegaly present.  Cardiovascular: Normal rate, regular rhythm, normal heart sounds and intact distal pulses. Exam reveals no gallop and no friction rub.  No murmur heard.  Respiratory: Effort normal and breath sounds normal. No respiratory distress. She has no wheezes. She has no rales. She exhibits no tenderness.  GI: Soft. Bowel sounds are normal. She  exhibits no distension and no mass. There is tenderness. There is no rebound and no guarding.  Genitourinary:  Vulva is normal without lesions Vagina is pink moist without discharge Cervix normal in appearance and pap is normal Uterus is normal y sonogram on 04/07/2012 Adnexa is negative with normal sized ovaries by sonogram  Musculoskeletal: Normal range of motion. She exhibits no edema and no tenderness.  Neurological: She is alert and oriented to person, place, and time. She has normal reflexes. She displays normal reflexes. No cranial nerve deficit.  She exhibits normal muscle tone. Coordination normal.  Skin: Skin is warm and dry. No rash noted. No erythema. No pallor.  Psychiatric: She has a normal mood and affect. Her behavior is normal. Judgment and thought content normal.  Labs:  Results for orders placed during the hospital encounter of 08/16/12 (from the past 336 hour(s))   POCT PREGNANCY, URINE    Collection Time    08/16/12 4:44 PM   Result  Value  Range    Preg Test, Ur  NEGATIVE  NEGATIVE   POCT I-STAT, CHEM 8    Collection Time    08/16/12 4:57 PM   Result  Value  Range    Sodium  141  135 - 145 mEq/L    Potassium  3.0 (*)  3.5 - 5.1 mEq/L    Chloride  101  96 - 112 mEq/L    BUN  10  6 - 23 mg/dL    Creatinine, Ser  1.61  0.50 - 1.10 mg/dL    Glucose, Bld  89  70 - 99 mg/dL    Calcium, Ion  0.96 (*)  1.12 - 1.23 mmol/L    TCO2  32  0 - 100 mmol/L    Hemoglobin  11.9 (*)  12.0 - 15.0 g/dL    HCT  04.5 (*)  40.9 - 46.0 %   EKG:  Orders placed during the hospital encounter of 08/16/12   .  ED EKG   .  ED EKG   .  EKG 12-LEAD   .  EKG 12-LEAD   .  EKG   Imaging Studies:  No results found.  Assessment:  Menometrorrhagia  Dysmenorrhea  Patient Active Problem List   Diagnosis   .  GERD   .  CONSTIPATION   .  IBS (irritable bowel syndrome)   .  Epigastric pain   .  Chest pain   .  Diarrhea   .  HTN (hypertension)   .  Cystitis   .  Anxiety   .  Obesity    .  Migraine headache   .  Recurrent UTI   .  Costochondritis   .  Shoulder bursitis   .  Shoulder pain, left   .  Menorrhagia   .  Back pain   .  Sinusitis   .  Viral illness   .  Vaginitis   .  Sciatica   .  Dysmenorrhea   Plan:  Proceed with endometrial ablation hysteroscopy and curettage  EURE,LUTHER H 09/07/2012 8:49 AM

## 2012-09-07 NOTE — Anesthesia Procedure Notes (Signed)
Procedure Name: Intubation Date/Time: 09/07/2012 10:46 AM Performed by: Carolyne Littles, AMY L Pre-anesthesia Checklist: Patient identified, Patient being monitored, Timeout performed, Emergency Drugs available and Suction available Patient Re-evaluated:Patient Re-evaluated prior to inductionOxygen Delivery Method: Circle System Utilized Preoxygenation: Pre-oxygenation with 100% oxygen Intubation Type: IV induction, Cricoid Pressure applied and Rapid sequence Ventilation: Mask ventilation without difficulty Laryngoscope Size: 3 and Miller Grade View: Grade I Tube type: Oral Tube size: 7.0 mm Number of attempts: 1 Airway Equipment and Method: stylet Placement Confirmation: ETT inserted through vocal cords under direct vision,  positive ETCO2 and breath sounds checked- equal and bilateral Secured at: 21 cm Tube secured with: Tape Dental Injury: Teeth and Oropharynx as per pre-operative assessment

## 2012-09-09 ENCOUNTER — Encounter (HOSPITAL_COMMUNITY): Payer: Self-pay | Admitting: Obstetrics & Gynecology

## 2012-09-09 ENCOUNTER — Telehealth: Payer: Self-pay

## 2012-09-09 MED ORDER — POTASSIUM CHLORIDE CRYS ER 20 MEQ PO TBCR: 20.0000 meq | EXTENDED_RELEASE_TABLET | Freq: Two times a day (BID) | ORAL | Status: DC

## 2012-09-09 NOTE — Telephone Encounter (Signed)
Pt states that she had an appt with Dr Despina Hidden and got an ablasion and was told her potassium was LOW. It was 3.1 and they didn't send in her anything for it. Wants to know if you will send her in some Klorcon to [place]. Pt told to check with the pharmacy later this evening

## 2012-09-09 NOTE — Telephone Encounter (Signed)
Okay to send KCL by mouth daily for 1 week

## 2012-09-09 NOTE — Telephone Encounter (Signed)
Med sent in and pt aware to check with the pharmacy later

## 2012-09-12 ENCOUNTER — Telehealth: Payer: Self-pay | Admitting: Family Medicine

## 2012-09-12 NOTE — Telephone Encounter (Signed)
Called in to pharmacist .

## 2012-09-14 ENCOUNTER — Ambulatory Visit (INDEPENDENT_AMBULATORY_CARE_PROVIDER_SITE_OTHER): Payer: 59 | Admitting: Obstetrics & Gynecology

## 2012-09-14 ENCOUNTER — Encounter: Payer: Self-pay | Admitting: Obstetrics & Gynecology

## 2012-09-14 VITALS — BP 128/80 | Ht 63.0 in | Wt 189.0 lb

## 2012-09-14 DIAGNOSIS — Z9889 Other specified postprocedural states: Secondary | ICD-10-CM

## 2012-09-14 DIAGNOSIS — R319 Hematuria, unspecified: Secondary | ICD-10-CM

## 2012-09-14 DIAGNOSIS — R300 Dysuria: Secondary | ICD-10-CM

## 2012-09-14 LAB — POCT URINALYSIS DIPSTICK
Ketones, UA: NEGATIVE
Leukocytes, UA: NEGATIVE
Nitrite, UA: NEGATIVE
Protein, UA: NEGATIVE

## 2012-09-14 NOTE — Progress Notes (Signed)
Patient ID: Bethany King, female   DOB: 01/02/1971, 42 y.o.   MRN: [id number]  HPI: Patient returns for routine postoperative follow-up having undergone endometrial ablation on 3.27.2014. The patient's early postoperative recovery while in the hospital was notable for unremarkable. Since hospital discharge the patient reports normal post op.   Current Outpatient Prescriptions  Medication Sig Dispense Refill  . ALPRAZolam (XANAX) 0.5 MG tablet Take 0.5 mg by mouth 3 (three) times daily as needed for sleep or anxiety.       . fluticasone (FLONASE) 50 MCG/ACT nasal spray Place 2 sprays into the nose daily.      . hydrochlorothiazide (HYDRODIURIL) 25 MG tablet Take 25 mg by mouth daily.      Marland Kitchen loratadine (CLARITIN) 10 MG tablet Take 10 mg by mouth daily.      . Multiple Vitamins-Minerals (MULTIVITAMIN WITH MINERALS) tablet Take 1 tablet by mouth daily.      . naphazoline-glycerin (CLEAR EYES REDNESS RELIEF) 0.012-0.2 % SOLN Place 1-2 drops into both eyes every 4 (four) hours as needed (Redness).      . potassium chloride SA (KLOR-CON M20) 20 MEQ tablet Take 1 tablet (20 mEq total) by mouth 2 (two) times daily. 1 tab daily for 7 days  7 tablet  0  . Probiotic Product (HEALTHY COLON) CAPS Take 1 capsule by mouth daily.        . Vilazodone HCl (VIIBRYD) 40 MG TABS Take 40 mg by mouth daily.      Marland Kitchen acyclovir (ZOVIRAX) 400 MG tablet       . ibuprofen (ADVIL,MOTRIN) 200 MG tablet Take 200-400 mg by mouth daily as needed for pain.      Marland Kitchen ketorolac (TORADOL) 10 MG tablet Take 1 tablet (10 mg total) by mouth every 8 (eight) hours as needed for pain.  15 tablet  0  . ondansetron (ZOFRAN) 8 MG tablet Take 1 tablet (8 mg total) by mouth every 8 (eight) hours as needed for nausea.  10 tablet  0  . oxyCODONE-acetaminophen (PERCOCET) 7.5-325 MG per tablet Take 1-2 tablets by mouth every 6 (six) hours as needed for pain.  30 tablet  0  . SUMAtriptan (IMITREX) 50 MG tablet Take 1 tablet at onset of headache  ,may repeat in 1 hour, no more than 2 tablets daily  10 tablet  3  . [DISCONTINUED] omeprazole (PRILOSEC) 20 MG capsule Take 20 mg by mouth daily.         No current facility-administered medications for this visit.    Physical Exam: Vagina pink moist watery discharge Uterus normal non tender, normal post op  Diagnostic Tests: none  Impression: S/P endometrial ablation  Plan: Routine post op care

## 2012-09-14 NOTE — Patient Instructions (Addendum)

## 2012-09-15 ENCOUNTER — Telehealth: Payer: Self-pay | Admitting: Adult Health

## 2012-09-15 NOTE — Telephone Encounter (Signed)
Pt aware FMLA forms completed and will be faxed tomorrow after Dr. Despina Hidden signs.

## 2012-09-22 ENCOUNTER — Encounter: Payer: Self-pay | Admitting: Family Medicine

## 2012-09-22 ENCOUNTER — Other Ambulatory Visit: Payer: Self-pay | Admitting: Family Medicine

## 2012-09-22 ENCOUNTER — Ambulatory Visit (INDEPENDENT_AMBULATORY_CARE_PROVIDER_SITE_OTHER): Payer: 59 | Admitting: Family Medicine

## 2012-09-22 VITALS — BP 116/72 | HR 78 | Resp 18 | Ht 63.0 in | Wt 186.1 lb

## 2012-09-22 DIAGNOSIS — J302 Other seasonal allergic rhinitis: Secondary | ICD-10-CM

## 2012-09-22 DIAGNOSIS — J309 Allergic rhinitis, unspecified: Secondary | ICD-10-CM

## 2012-09-22 DIAGNOSIS — E876 Hypokalemia: Secondary | ICD-10-CM

## 2012-09-22 DIAGNOSIS — J329 Chronic sinusitis, unspecified: Secondary | ICD-10-CM

## 2012-09-22 MED ORDER — AMOXICILLIN-POT CLAVULANATE 875-125 MG PO TABS: 1.0000 | ORAL_TABLET | Freq: Two times a day (BID) | ORAL | Status: DC

## 2012-09-22 MED ORDER — HYDROCODONE-ACETAMINOPHEN 5-325 MG PO TABS: 1.0000 | ORAL_TABLET | Freq: Four times a day (QID) | ORAL | Status: DC | PRN

## 2012-09-22 MED ORDER — FLUTICASONE PROPIONATE 50 MCG/ACT NA SUSP: 2.0000 | Freq: Every day | NASAL | Status: DC

## 2012-09-22 MED ORDER — POTASSIUM CHLORIDE ER 10 MEQ PO TBCR: 10.0000 meq | EXTENDED_RELEASE_TABLET | Freq: Every day | ORAL | Status: DC

## 2012-09-22 NOTE — Assessment & Plan Note (Signed)
Completed 1 week course of potassium. She gets cramps at times with HCTZ, add daily, BMET today

## 2012-09-22 NOTE — Assessment & Plan Note (Signed)
Change to zyrtec and flonase

## 2012-09-22 NOTE — Patient Instructions (Signed)
Start antibiotics Zyrtec over the counter Flonase for your nose Start low dose potassium with the HCTZ F/U  As previous

## 2012-09-22 NOTE — Assessment & Plan Note (Signed)
2nd episode of sinusitis this year, we will improve on her allergy treatment Repeat Augmentin she did well with this If this reoccurs get CT max/facial

## 2012-09-22 NOTE — Progress Notes (Signed)
Subjective:    Patient ID: Bethany King, female    DOB: 1970-09-18, 42 y.o.   MRN: [id number]  HPI  Pt here for sinusitis, she was treating nasal drainage and congestion , watery eyes, for the past 4 weeks, then symptoms worsened to severe sinus headache and pain on left side of face, no fever. Using claritin OTC, OTC eye drop.  Review of Systems   GEN- denies fatigue, fever, weight loss,weakness, recent illness HEENT- denies eye drainage, change in vision, +nasal discharge, CVS- denies chest pain, palpitations RESP- denies SOB, cough, wheeze ABD- denies N/V, change in stools, abd pain Neuro- denies headache, dizziness, syncope, seizure activity      Objective:   Physical Exam GEN- NAD, alert and oriented x3 HEENT- PERRL, EOMI, non injected sclera, pink conjunctiva, MMM, oropharynx mild injection, TM clear bilat no effusion, + maxillary sinus tenderness, inflammed turbinates,  Nasal drainage  Neck- Supple, no LAD CVS- RRR, no murmur RESP-CTAB EXT- No edema Pulses- Radial 2+          Assessment & Plan:

## 2012-09-23 ENCOUNTER — Other Ambulatory Visit: Payer: Self-pay

## 2012-09-23 ENCOUNTER — Telehealth: Payer: Self-pay | Admitting: Family Medicine

## 2012-09-23 DIAGNOSIS — J329 Chronic sinusitis, unspecified: Secondary | ICD-10-CM

## 2012-09-23 LAB — BASIC METABOLIC PANEL
CO2: 27 mEq/L (ref 19–32)
Calcium: 9.2 mg/dL (ref 8.4–10.5)
Creat: 0.72 mg/dL (ref 0.50–1.10)
Sodium: 142 mEq/L (ref 135–145)

## 2012-09-23 MED ORDER — AMOXICILLIN-POT CLAVULANATE 875-125 MG PO TABS: 1.0000 | ORAL_TABLET | Freq: Two times a day (BID) | ORAL | Status: AC

## 2012-09-23 NOTE — Telephone Encounter (Signed)
Patient aware of results and abt resent.

## 2012-10-04 ENCOUNTER — Telehealth: Payer: Self-pay | Admitting: Obstetrics & Gynecology

## 2012-10-06 NOTE — Telephone Encounter (Signed)
FMLA forms faxed, pt aware.

## 2012-10-07 ENCOUNTER — Encounter: Payer: Self-pay | Admitting: Family Medicine

## 2012-10-07 ENCOUNTER — Ambulatory Visit (INDEPENDENT_AMBULATORY_CARE_PROVIDER_SITE_OTHER): Payer: 59 | Admitting: Family Medicine

## 2012-10-07 VITALS — BP 130/80 | HR 77 | Resp 16 | Wt 188.0 lb

## 2012-10-07 DIAGNOSIS — J302 Other seasonal allergic rhinitis: Secondary | ICD-10-CM

## 2012-10-07 DIAGNOSIS — J309 Allergic rhinitis, unspecified: Secondary | ICD-10-CM

## 2012-10-07 DIAGNOSIS — N63 Unspecified lump in unspecified breast: Secondary | ICD-10-CM

## 2012-10-07 NOTE — Patient Instructions (Addendum)
Continue allergy medication with flonase If no improvement referral to allergies Mammogram to be set up of left breast

## 2012-10-09 ENCOUNTER — Encounter: Payer: Self-pay | Admitting: Family Medicine

## 2012-10-09 DIAGNOSIS — N63 Unspecified lump in unspecified breast: Secondary | ICD-10-CM | POA: Insufficient documentation

## 2012-10-09 NOTE — Progress Notes (Signed)
Subjective:    Patient ID: Bethany King, female    DOB: [birth year], 42 y.o.   MRN: [id number]  HPI  Pt here with lump in left breast x 5 weeks, waited because she thought it was due to her period, feels sore to touch. No drainage from breast Continues to have sinus pressure, sneezing, clear drainage from nose, med change has helped some, has not been using flonase, thought she was to stop after the antibiotics  Review of Systems - per above  GEN- denies fatigue, fever, weight loss,weakness, recent illness HEENT- denies eye drainage, change in vision, +nasal discharge, CVS- denies chest pain, palpitations RESP- denies SOB, cough, wheeze Neuro- denies headache, dizziness, syncope, seizure activity       Objective:   Physical Exam GEN- NAD, alert and oriented x3 HEENT- PERRL, EOMI, non injected sclera, pink conjunctiva, MMM, oropharynx mild injection, TM clear bilat no effusion, no maxillary sinus tenderness,nares cclear Neck- Supple, no LAD CVS- RRR, no murmur RESP-CTAB Breast- normal symmetry, no nipple inversion,no nipple drainage, + small nodule felt 1cm above left nipple, NT, fibrous texture to breast bilat, TTP over region noted Nodes- no axillary nodes        Assessment & Plan:

## 2012-10-09 NOTE — Assessment & Plan Note (Signed)
Continue current meds, she declines referral to allergist

## 2012-10-09 NOTE — Assessment & Plan Note (Signed)
Lesion felt left breast, possible cystic lesion, send for diagnostic imaging

## 2012-10-19 ENCOUNTER — Encounter (HOSPITAL_COMMUNITY): Payer: 59

## 2012-10-21 ENCOUNTER — Ambulatory Visit: Payer: 59 | Admitting: Family Medicine

## 2012-10-21 ENCOUNTER — Encounter: Payer: Self-pay | Admitting: Obstetrics & Gynecology

## 2012-10-21 ENCOUNTER — Ambulatory Visit (INDEPENDENT_AMBULATORY_CARE_PROVIDER_SITE_OTHER): Payer: 59 | Admitting: Obstetrics & Gynecology

## 2012-10-21 VITALS — BP 120/80 | Wt 188.0 lb

## 2012-10-21 DIAGNOSIS — N949 Unspecified condition associated with female genital organs and menstrual cycle: Secondary | ICD-10-CM

## 2012-10-21 NOTE — Progress Notes (Signed)
Patient ID: Bethany King, female   DOB: Jan 03, 1971, 42 y.o.   MRN: [id number] Patient has been having a good deal of low back pain. Also pelvic pain minimal discharge Status post ablation in march did well with that , had 3 days of smudging in April  Exam Clear discharge, probably ovulatory No CMT  Adnexa is negative NT no masses  No evidence of gyn source ?back source  Follow up prn

## 2012-10-24 ENCOUNTER — Ambulatory Visit (INDEPENDENT_AMBULATORY_CARE_PROVIDER_SITE_OTHER): Payer: 59 | Admitting: Family Medicine

## 2012-10-24 ENCOUNTER — Encounter: Payer: Self-pay | Admitting: Family Medicine

## 2012-10-24 ENCOUNTER — Telehealth: Payer: Self-pay | Admitting: Family Medicine

## 2012-10-24 VITALS — BP 130/88 | HR 103 | Resp 16 | Wt 184.4 lb

## 2012-10-24 DIAGNOSIS — M549 Dorsalgia, unspecified: Secondary | ICD-10-CM

## 2012-10-24 MED ORDER — HYDROCODONE-ACETAMINOPHEN 5-325 MG PO TABS: 1.0000 | ORAL_TABLET | Freq: Four times a day (QID) | ORAL | Status: DC | PRN

## 2012-10-24 MED ORDER — CYCLOBENZAPRINE HCL 10 MG PO TABS: 10.0000 mg | ORAL_TABLET | Freq: Three times a day (TID) | ORAL | Status: DC | PRN

## 2012-10-24 NOTE — Patient Instructions (Addendum)
Start muscle relaxant and hydcrcodone for pain I will discuss with Dr. Ladona Ridgel regarding neurosurgery referral Will plan for MRI of back  Okay to use Heat

## 2012-10-24 NOTE — Assessment & Plan Note (Addendum)
Chronic back pain on and off, with diagnosis of sciatica Has been having treatments done every few weeks, with worsening pain Vicodin, flexeril given Did not toelrate, oral steroids, oral NSAIDS have not helped, does not tolerate gabapentin Discussed I will not give her FMLA for this or take her out of work, see previous notes, almost every visit has asked for days out of work, this is a legitimate concern and I discussed this with her. I am not discrediting her pain but I do find this to be a red flag  I spoke with Dr. Ladona Ridgel, pt has been treated on and off, he thinks more of strain due to job, he has not had any plain films recently, he did not refer her to a neurosurgeon Plain films of back to be done,  Will need to take NSAIDS BID with food

## 2012-10-24 NOTE — Progress Notes (Signed)
Subjective:    Patient ID: Bethany King, female    DOB: 02-Dec-1970, 42 y.o.   MRN: [id number]  HPI  Patient presents with worsening low back pain for the past 8 days. She denies any specific injury has had chronic back pain and sciatica on and off for she does the chiropractor for. The past 8 days she's had severe back pain radiating mostly down the left side she also gets some tingling and numbness in her thigh on the left side. She ran out of her hydrocodone and her muscle relaxants. She was given Toradol by GYN because she has some pain down to her pelvic area she was given a normal pelvic examination and told that this was likely her back. The Toradol did not help. She did have an adjustment by her chiropractor last Friday. She missed work on Friday and today  Review of Systems - per above   GEN- denies fatigue, fever, weight loss,weakness, recent illness HEENT- denies eye drainage, change in vision, nasal discharge, ABD- denies N/V, change in stools, abd pain GU- denies dysuria, hematuria, dribbling, incontinence MSK- + joint pain, muscle aches, injury       Objective:   Physical Exam GEN- NAD, alert and oriented x3 MSK- Spine TTP Lower thoracic and lumbar region, +spasm, + SLR on left side, pain with IR/ER of hip bilaterally, pain with flexion and extension, decreased ROM with flexion and extension Neuro- Motor intact bilat,decreased on left compared to right LE , sensation grossly in tact, DTR symmetric LE, normal tone, antalgic gait, walks on tips or toes and heels EXT- No edema Pulses- Radial, DP- 2+        Assessment & Plan:

## 2012-10-24 NOTE — Telephone Encounter (Signed)
Please let patient know I spoke with Dr. Ladona Ridgel her chiropractor. He's not had any recent films done on her back. He did not refer her to a neurosurgeon he was advised her to follow with her primary doctor to see if this was necessary. She needs to get plain x-rays of her back done within the next week. I also advised her to continue taking which have her anti-inflammatory she has at home whether this is Naprosyn orifices ibuprofen twice a day she can continue with the Vicodin and muscle relaxants I gave her. We will need to do this for 4-6 weeks before an MRI can be ordered. She can call me during the time if no improvement. If she is able, she can be sent to physical therapy for her lower back

## 2012-10-25 NOTE — Telephone Encounter (Signed)
PATIENT AWARE

## 2012-10-26 ENCOUNTER — Other Ambulatory Visit (HOSPITAL_COMMUNITY): Payer: Self-pay | Admitting: Physical Medicine and Rehabilitation

## 2012-10-26 ENCOUNTER — Encounter (HOSPITAL_COMMUNITY): Payer: 59

## 2012-10-26 DIAGNOSIS — IMO0002 Reserved for concepts with insufficient information to code with codable children: Secondary | ICD-10-CM

## 2012-10-28 ENCOUNTER — Ambulatory Visit (HOSPITAL_COMMUNITY)
Admission: RE | Admit: 2012-10-28 | Discharge: 2012-10-28 | Disposition: A | Discharge status: Home / Self Care | Payer: 59 | Source: Ambulatory Visit | Attending: Physical Medicine and Rehabilitation | Admitting: Physical Medicine and Rehabilitation

## 2012-10-28 DIAGNOSIS — IMO0002 Reserved for concepts with insufficient information to code with codable children: Secondary | ICD-10-CM

## 2012-10-28 DIAGNOSIS — M545 Low back pain, unspecified: Secondary | ICD-10-CM | POA: Insufficient documentation

## 2012-10-28 DIAGNOSIS — M47817 Spondylosis without myelopathy or radiculopathy, lumbosacral region: Secondary | ICD-10-CM | POA: Insufficient documentation

## 2012-11-02 ENCOUNTER — Other Ambulatory Visit: Payer: Self-pay | Admitting: Family Medicine

## 2012-11-11 ENCOUNTER — Ambulatory Visit (HOSPITAL_COMMUNITY)
Admission: RE | Admit: 2012-11-11 | Discharge: 2012-11-11 | Disposition: A | Discharge status: Home / Self Care | Payer: 59 | Source: Ambulatory Visit | Attending: Urology | Admitting: Urology

## 2012-11-11 ENCOUNTER — Other Ambulatory Visit: Payer: Self-pay | Admitting: Urology

## 2012-11-11 DIAGNOSIS — N200 Calculus of kidney: Secondary | ICD-10-CM | POA: Insufficient documentation

## 2012-11-14 ENCOUNTER — Ambulatory Visit (INDEPENDENT_AMBULATORY_CARE_PROVIDER_SITE_OTHER): Payer: 59 | Admitting: Neurology

## 2012-11-14 ENCOUNTER — Ambulatory Visit (INDEPENDENT_AMBULATORY_CARE_PROVIDER_SITE_OTHER): Payer: 59

## 2012-11-14 DIAGNOSIS — Z0289 Encounter for other administrative examinations: Secondary | ICD-10-CM

## 2012-11-14 DIAGNOSIS — M79609 Pain in unspecified limb: Secondary | ICD-10-CM

## 2012-11-14 NOTE — Procedures (Signed)
  HISTORY:  Bethany King is a 42 year old patient with a history of low back pain dating back about 6 months. The patient reports pain down the left greater than right leg. The patient has had gradual worsening of the pain. The patient is being evaluated for a possible neuropathy or a lumbosacral radiculopathy.  NERVE CONDUCTION STUDIES:  Nerve conduction studies were performed on both lower extremities. The distal motor latencies and motor amplitudes for the peroneal and posterior tibial nerves were within normal limits. The nerve conduction velocities for these nerves were also normal. The H reflex latencies were normal. The sensory latencies for the peroneal nerves and saphenous nerves were within normal limits bilaterally.    EMG STUDIES:  EMG study was performed on the left lower extremity:  The tibialis anterior muscle reveals 2 to 4K motor units with full recruitment. No fibrillations or positive waves were seen. The peroneus tertius muscle reveals 2 to 4K motor units with full recruitment. No fibrillations or positive waves were seen. The medial gastrocnemius muscle reveals 1 to 3K motor units with full recruitment. No fibrillations or positive waves were seen. The vastus lateralis muscle reveals 2 to 4K motor units with full recruitment. No fibrillations or positive waves were seen. The iliopsoas muscle reveals 2 to 4K motor units with full recruitment. No fibrillations or positive waves were seen. The biceps femoris muscle (long head) reveals 2 to 4K motor units with full recruitment. No fibrillations or positive waves were seen. The lumbosacral paraspinal muscles were tested at 3 levels, and revealed no abnormalities of insertional activity at all 3 levels tested. There was good relaxation.  EMG study was performed on the right lower extremity:  The tibialis anterior muscle reveals 2 to 4K motor units with full recruitment. No fibrillations or positive waves were seen. The  peroneus tertius muscle reveals 2 to 4K motor units with full recruitment. No fibrillations or positive waves were seen. The medial gastrocnemius muscle reveals 1 to 3K motor units with full recruitment. No fibrillations or positive waves were seen. The vastus lateralis muscle reveals 2 to 4K motor units with full recruitment. No fibrillations or positive waves were seen. The iliopsoas muscle reveals 2 to 4K motor units with full recruitment. No fibrillations or positive waves were seen. The biceps femoris muscle (long head) reveals 2 to 4K motor units with full recruitment. No fibrillations or positive waves were seen. The lumbosacral paraspinal muscles were tested at 3 levels, and revealed no abnormalities of insertional activity at the upper level tested. One plus fibrillations and positive waves were seen at the middle and lower levels. There was good relaxation.    IMPRESSION:  Nerve conduction studies done on both lower extremities were unremarkable. There is no evidence of a peripheral neuropathy. EMG evaluation of both lower extremities were essentially normal. Very minimal acute denervation is seen at 2 levels on the right lumbosacral paraspinal muscles. The clinical significance of this is unclear, but this could represent a very low-grade lumbosacral radiculopathy of indeterminate level. Clinical correlation is required.  Marlan Palau MD 11/14/2012 4:22 PM  [facility] [address] [place], [place] [address]  Phone [phone] Fax [phone]

## 2012-11-16 ENCOUNTER — Encounter (HOSPITAL_COMMUNITY): Payer: 59

## 2012-11-18 ENCOUNTER — Ambulatory Visit (INDEPENDENT_AMBULATORY_CARE_PROVIDER_SITE_OTHER): Payer: 59 | Admitting: Urology

## 2012-11-18 DIAGNOSIS — N200 Calculus of kidney: Secondary | ICD-10-CM

## 2012-11-21 ENCOUNTER — Other Ambulatory Visit: Payer: 59 | Admitting: Obstetrics & Gynecology

## 2012-11-23 ENCOUNTER — Encounter (HOSPITAL_COMMUNITY): Payer: 59

## 2012-11-23 ENCOUNTER — Ambulatory Visit (HOSPITAL_COMMUNITY)
Admission: RE | Admit: 2012-11-23 | Discharge: 2012-11-23 | Disposition: A | Discharge status: Home / Self Care | Payer: 59 | Source: Ambulatory Visit | Attending: Family Medicine | Admitting: Family Medicine

## 2012-11-23 DIAGNOSIS — N63 Unspecified lump in unspecified breast: Secondary | ICD-10-CM | POA: Insufficient documentation

## 2013-01-06 ENCOUNTER — Telehealth: Payer: Self-pay | Admitting: Family Medicine

## 2013-01-06 MED ORDER — SUMATRIPTAN SUCCINATE 50 MG PO TABS: ORAL_TABLET | ORAL | Status: DC

## 2013-01-06 NOTE — Telephone Encounter (Signed)
Okay to refill? 

## 2013-01-06 NOTE — Telephone Encounter (Signed)
Medication refilled per protocol. 

## 2013-02-02 ENCOUNTER — Emergency Department (HOSPITAL_COMMUNITY)
Admission: EM | Admit: 2013-02-02 | Discharge: 2013-02-02 | Disposition: A | Discharge status: Home / Self Care | Payer: 59 | Attending: Emergency Medicine | Admitting: Emergency Medicine

## 2013-02-02 ENCOUNTER — Encounter (HOSPITAL_COMMUNITY): Payer: Self-pay | Admitting: Emergency Medicine

## 2013-02-02 ENCOUNTER — Emergency Department (HOSPITAL_COMMUNITY): Payer: 59

## 2013-02-02 DIAGNOSIS — R350 Frequency of micturition: Secondary | ICD-10-CM | POA: Insufficient documentation

## 2013-02-02 DIAGNOSIS — R109 Unspecified abdominal pain: Secondary | ICD-10-CM

## 2013-02-02 DIAGNOSIS — R110 Nausea: Secondary | ICD-10-CM | POA: Insufficient documentation

## 2013-02-02 DIAGNOSIS — F3289 Other specified depressive episodes: Secondary | ICD-10-CM | POA: Insufficient documentation

## 2013-02-02 DIAGNOSIS — Z87718 Personal history of other specified (corrected) congenital malformations of genitourinary system: Secondary | ICD-10-CM | POA: Insufficient documentation

## 2013-02-02 DIAGNOSIS — Z862 Personal history of diseases of the blood and blood-forming organs and certain disorders involving the immune mechanism: Secondary | ICD-10-CM | POA: Insufficient documentation

## 2013-02-02 DIAGNOSIS — R42 Dizziness and giddiness: Secondary | ICD-10-CM | POA: Insufficient documentation

## 2013-02-02 DIAGNOSIS — Z9889 Other specified postprocedural states: Secondary | ICD-10-CM | POA: Insufficient documentation

## 2013-02-02 DIAGNOSIS — Z87440 Personal history of urinary (tract) infections: Secondary | ICD-10-CM | POA: Insufficient documentation

## 2013-02-02 DIAGNOSIS — N83209 Unspecified ovarian cyst, unspecified side: Secondary | ICD-10-CM

## 2013-02-02 DIAGNOSIS — F329 Major depressive disorder, single episode, unspecified: Secondary | ICD-10-CM | POA: Insufficient documentation

## 2013-02-02 DIAGNOSIS — F411 Generalized anxiety disorder: Secondary | ICD-10-CM | POA: Insufficient documentation

## 2013-02-02 DIAGNOSIS — Z8719 Personal history of other diseases of the digestive system: Secondary | ICD-10-CM | POA: Insufficient documentation

## 2013-02-02 DIAGNOSIS — Z87442 Personal history of urinary calculi: Secondary | ICD-10-CM | POA: Insufficient documentation

## 2013-02-02 DIAGNOSIS — Z3202 Encounter for pregnancy test, result negative: Secondary | ICD-10-CM | POA: Insufficient documentation

## 2013-02-02 DIAGNOSIS — Z8739 Personal history of other diseases of the musculoskeletal system and connective tissue: Secondary | ICD-10-CM | POA: Insufficient documentation

## 2013-02-02 DIAGNOSIS — I10 Essential (primary) hypertension: Secondary | ICD-10-CM | POA: Insufficient documentation

## 2013-02-02 DIAGNOSIS — Z79899 Other long term (current) drug therapy: Secondary | ICD-10-CM | POA: Insufficient documentation

## 2013-02-02 LAB — URINE MICROSCOPIC-ADD ON

## 2013-02-02 LAB — BASIC METABOLIC PANEL
Calcium: 9.6 mg/dL (ref 8.4–10.5)
GFR calc Af Amer: >90 mL/min (ref >90)
GFR calc non Af Amer: >90 mL/min (ref >90)
Potassium: 3.2 mEq/L — ABNORMAL LOW (ref 3.5–5.1)
Sodium: 139 mEq/L (ref 135–145)

## 2013-02-02 LAB — URINALYSIS, ROUTINE W REFLEX MICROSCOPIC
Bilirubin Urine: NEGATIVE
Leukocytes, UA: NEGATIVE
Nitrite: NEGATIVE
Specific Gravity, Urine: 1.01 (ref 1.005–1.030)
Urobilinogen, UA: 0.2 mg/dL (ref 0.0–1.0)
pH: 7 (ref 5.0–8.0)

## 2013-02-02 LAB — PREGNANCY, URINE: Preg Test, Ur: NEGATIVE

## 2013-02-02 MED ORDER — KETOROLAC TROMETHAMINE 30 MG/ML IJ SOLN
30.0000 mg | Freq: Once | INTRAMUSCULAR | Status: AC
  Administered 2013-02-02: 30 mg via INTRAVENOUS
  Filled 2013-02-02: qty 1

## 2013-02-02 MED ORDER — ONDANSETRON HCL 4 MG/2ML IJ SOLN
4.0000 mg | Freq: Once | INTRAMUSCULAR | Status: AC
  Administered 2013-02-02: 4 mg via INTRAVENOUS
  Filled 2013-02-02: qty 2

## 2013-02-02 MED ORDER — NAPROXEN 500 MG PO TABS: 500.0000 mg | ORAL_TABLET | Freq: Two times a day (BID) | ORAL | Status: DC

## 2013-02-02 MED ORDER — SODIUM CHLORIDE 0.9 % IV BOLUS (SEPSIS)
1000.0000 mL | Freq: Once | INTRAVENOUS | Status: AC
  Administered 2013-02-02: 1000 mL via INTRAVENOUS

## 2013-02-02 MED ORDER — HYDROCODONE-ACETAMINOPHEN 5-325 MG PO TABS: 2.0000 | ORAL_TABLET | ORAL | Status: DC | PRN

## 2013-02-02 MED ORDER — TAMSULOSIN HCL 0.4 MG PO CAPS
0.4000 mg | ORAL_CAPSULE | Freq: Once | ORAL | Status: AC
  Administered 2013-02-02: 0.4 mg via ORAL
  Filled 2013-02-02: qty 1

## 2013-02-02 MED ORDER — ONDANSETRON HCL 4 MG PO TABS: 4.0000 mg | ORAL_TABLET | Freq: Four times a day (QID) | ORAL | Status: DC

## 2013-02-02 NOTE — ED Notes (Signed)
Lower back pain x 2 weeks, left flank pain. Urinary freq and nausea.

## 2013-02-02 NOTE — ED Notes (Signed)
MD at bedside. 

## 2013-02-02 NOTE — ED Provider Notes (Signed)
CSN: [id number]     Arrival date & time 02/02/13  1547 History   This chart was scribed for Claudean Kinds, MD by Dorothey Baseman, ED Scribe and Bennett Scrape, ED Scribe. This patient was seen in room [facility]19/[facility] and the patient's care was started at 3:55 PM.   First MD Initiated Contact with Patient 02/02/13 1555     Chief Complaint  Patient presents with  . Flank Pain    The history is provided by the patient. No language interpreter was used.   HPI Comments: Bethany King is a 42 y.o. female with history of kidney stones presents to the Emergency Department complaining of left lower flank pain that she describes as sharp that radiates to the left inguinal region and similar to her past kidney stones. She states that the symptoms began 2 weeks ago and have been gradually worsening over the past 1.5 weeks after passing a kidney stone (states she spotted the stone in the toilet). She states that she had mild temporary relief before symptoms worsened. She reports associated urinary frequency, nausea, and dizziness. She reports urine is normal in color but malodorous. She states hat she has been taking Advil for her symptoms with no improvement and reports that movement improves her symptoms, she is unable to stay still secondary to the pain. She has had an x-ray in June 2014 that showed stones in both kidneys but has had no further work up. She has had history of lithotripsy and extractions from prior stones. She admits that she has also experienced kidney infections but states that this feels similar to her prior kidney stone episodes. However, she states that she took an OTC UTI test kit at home that showed "some infection".  She states Dr. Chauncy Lean is her urologist and she reports that she can't make an appointment until next week. She denies hematuria, fever, shakes, chills, or headache. She states she has history of costochrondritis and a bulging disc but denies any changes in the pain. She was  recently given Toradol injections 2 days ago and is receiving PT for the pain with improvement. She is allergic to hydromorphone and tramadol.  Past Medical History  Diagnosis Date  . GERD (gastroesophageal reflux disease)   . Irritable bowel syndrome (IBS)   . HTN (hypertension)   . Anxiety   . Depression   . Multiple hemangiomas 2006    hepatic, stable  . Costochondritis   . S/P endoscopy April 2012    mild gastritis, benign gastric polyp  . S/P colonoscopy April 2012    internal hemorrhoids, simple adenoma  . Iron deficiency anemia   . Hepatic hemangioma   . Cervical disc herniation   . Nephrolithiasis 2006    w/ hydronephrosis  . Recurrent UTI   . Medullary sponge kidney     left  . Complication of anesthesia   . PONV (postoperative nausea and vomiting)    Past Surgical History  Procedure Laterality Date  . Carpel tunnel release  03/2010 and 05/2010    bilateral  . Kidney stone surgery      numerous  . Foot surgery      right- plantar fasciotomy  . Esophagogastroduodenoscopy  10/14/10  . Dilitation & currettage/hystroscopy with thermachoice ablation N/A 09/07/2012    Procedure: DILATATION & CURETTAGE/HYSTEROSCOPY WITH THERMACHOICE ABLATION;  Surgeon: Lazaro Arms, MD;  Location: [facility];  Service: Gynecology;  Laterality: N/A;  . Endometrial ablation  2014   Family History  Problem Relation Age  of Onset  . Irritable bowel syndrome Mother   . Scleroderma Mother   . Rheum arthritis Mother   . Asthma Mother   . Colon cancer Cousin 61    second cousin Nettie Elm Briggs)  . Melanoma Father   . Cancer Father     Melanoma  . Cancer Maternal Grandmother     Ovarian  . Diabetes Maternal Grandmother   . Thyroid disease Maternal Aunt    History  Substance Use Topics  . Smoking status: Never Smoker   . Smokeless tobacco: Never Used  . Alcohol Use: No   OB History   Grav Para Term Preterm Abortions TAB SAB Ect Mult Living   0              Review of Systems   Constitutional: Negative for fever, chills, diaphoresis, appetite change and fatigue.  HENT: Negative for sore throat, mouth sores and trouble swallowing.   Eyes: Negative for visual disturbance.  Respiratory: Negative for cough, chest tightness, shortness of breath and wheezing.   Cardiovascular: Negative for chest pain.  Gastrointestinal: Positive for nausea. Negative for vomiting, diarrhea and abdominal distention.  Endocrine: Negative for polydipsia, polyphagia and polyuria.  Genitourinary: Positive for frequency and flank pain. Negative for dysuria and hematuria.  Musculoskeletal: Negative for gait problem.  Skin: Negative for color change, pallor and rash.  Neurological: Positive for dizziness. Negative for syncope, light-headedness and headaches.  Hematological: Does not bruise/bleed easily.  Psychiatric/Behavioral: Negative for behavioral problems and confusion.    Allergies  Hydromorphone; Prednisone; Tramadol hcl; and Sulfonamide derivatives  Home Medications   Current Outpatient Rx  Name  Route  Sig  Dispense  Refill  . ALPRAZolam (XANAX) 0.5 MG tablet   Oral   Take 0.5 mg by mouth 3 (three) times daily as needed for sleep or anxiety.          . hydrochlorothiazide (HYDRODIURIL) 25 MG tablet   Oral   Take 25 mg by mouth daily.         Marland Kitchen loratadine (CLARITIN) 10 MG tablet   Oral   Take 10 mg by mouth daily.         . potassium chloride (K-DUR) 10 MEQ tablet   Oral   Take 1 tablet (10 mEq total) by mouth daily.   30 tablet   6   . Probiotic Product (HEALTHY COLON) CAPS   Oral   Take 1 capsule by mouth daily.           . Vilazodone HCl (VIIBRYD) 40 MG TABS   Oral   Take 40 mg by mouth daily.         Marland Kitchen HYDROcodone-acetaminophen (NORCO/VICODIN) 5-325 MG per tablet   Oral   Take 2 tablets by mouth every 4 (four) hours as needed for pain.   10 tablet   0   . Multiple Vitamins-Minerals (MULTIVITAMIN WITH MINERALS) tablet   Oral   Take 1 tablet by  mouth daily.         . naproxen (NAPROSYN) 500 MG tablet   Oral   Take 1 tablet (500 mg total) by mouth 2 (two) times daily.   30 tablet   0   . ondansetron (ZOFRAN) 4 MG tablet   Oral   Take 1 tablet (4 mg total) by mouth every 6 (six) hours.   12 tablet   0    Triage Vitals: BP 139/71  Pulse 77  Temp(Src) 98.5 F (36.9 C) (Oral)  Resp 19  SpO2 98%  LMP 01/11/2013  Physical Exam  Nursing note and vitals reviewed. Constitutional: She is oriented to person, place, and time. She appears well-developed and well-nourished. No distress.  HENT:  Head: Normocephalic and atraumatic.  Nose: Nose normal.  Mouth/Throat: Oropharynx is clear and moist.  Eyes: Conjunctivae and EOM are normal. Pupils are equal, round, and reactive to light. No scleral icterus.  Neck: Normal range of motion. Neck supple. No thyromegaly present.  Cardiovascular: Normal rate, regular rhythm and normal heart sounds.  Exam reveals no gallop and no friction rub.   No murmur heard. Pulmonary/Chest: Effort normal and breath sounds normal. No respiratory distress. She has no wheezes. She has no rales.  Abdominal: Soft. Bowel sounds are normal. She exhibits no distension. There is no tenderness. There is no rebound.  Patient reports pain in left lower abdomen, but no tenderness to palpation.  Musculoskeletal: Normal range of motion.  left flank tenderness  Lymphadenopathy:    She has no cervical adenopathy.  Neurological: She is alert and oriented to person, place, and time.  Skin: Skin is warm and dry. No rash noted.  Psychiatric: She has a normal mood and affect. Her behavior is normal.    ED Course   DIAGNOSTIC STUDIES: Oxygen Saturation is 98% on room air, normal by my interpretation.    COORDINATION OF CARE: 4:05PM- Patient with history of kidney stones presents to the ED complaining of left lower flank pain. Will order blood and urine tests to confirm whether symptoms present due to kidney stones  or a possible infection. Will not administer pain medication. Patient verbalizes understanding and agrees to treatment course. She does not have a current ride and understands that she will not receive  narcotics at this time.  Procedures (including critical care time)  Labs Reviewed  URINALYSIS, ROUTINE W REFLEX MICROSCOPIC - Abnormal; Notable for the following:    Hgb urine dipstick TRACE (*)    All other components within normal limits  BASIC METABOLIC PANEL - Abnormal; Notable for the following:    Potassium 3.2 (*)    All other components within normal limits  PREGNANCY, URINE  URINE MICROSCOPIC-ADD ON   Ct Abdomen Pelvis Wo Contrast  02/02/2013   *RADIOLOGY REPORT*  Clinical Data: Left flank pain.  History of renal stones.  Hepatic hemangiomas.  CT ABDOMEN AND PELVIS WITHOUT CONTRAST  Technique:  Multidetector CT imaging of the abdomen and pelvis was performed following the standard protocol without intravenous contrast.  Comparison: No CT of the 11/12/2010  Findings: Lung bases are clear.  No pericardial fluid.  Non-IV contrast images demonstrate low density lesion in the right hepatic lobe which correspond to hemangiomas  on comparison contrast CT  from 2012.  The gallbladder, pancreas, spleen, and adrenal glands are normal.  There are seven calculi within the right kidney ranging size from 2- 5 mm.  There are seven calculi within the left kidney ranging size from 2 - 6 mm.  There is renal cortical scarring in the left kidney.  There is no evidence of hydronephrosis.  There is no hydroureter or ureterolithiasis on the left or right.  No distal ureteral stones or bladder stones.  The stomach, small bowel, appendix, and cecum are normal.  The colon and rectosigmoid colon are normal.  Abdominal aorta normal caliber.  No retroperitoneal periportal lymphadenopathy.  No free fluid the pelvis.  Uterus and right ovary are normal. There is a low density cyst associated with the left ovary measuring 4.6  cm.  Bladder is normal.  No pelvic lymphadenopathy.  IMPRESSION:  1.  Bilateral nephrolithiasis.  No ureterolithiasis or obstructive uropathy. 2.  Large simple appearing cyst within the left ovary likely represents a functional ovarian cyst. 3. Hepatic hemangiomas again demonstrated.   Original Report Authenticated By: Genevive Bi, M.D.   1. Abdominal pain   2. Ovarian cyst     MDM  CT scan shows renal stones. No hydronephrosis. No ureteral stones. She is a for some of her left ovarian cyst. Her urine does not appear infected. I discussed these findings with her. Complaint of anti-inflammatories, pain medication, antiemetics. If her symptoms do not resolve of her next cycle she should followup with her OB/GYN. She is a G1 that she follows with. She had a endometrial ablation done 6 weeks ago. This may be why her ovarian cycle is off in when she developed a cyst I personally performed the services described in this documentation, which was scribed in my presence. The recorded information has been reviewed and is accurate.   Claudean Kinds, MD 02/02/13 1736

## 2013-02-09 ENCOUNTER — Encounter: Payer: Self-pay | Admitting: Obstetrics & Gynecology

## 2013-02-09 ENCOUNTER — Ambulatory Visit (INDEPENDENT_AMBULATORY_CARE_PROVIDER_SITE_OTHER): Payer: 59 | Admitting: Obstetrics & Gynecology

## 2013-02-09 VITALS — BP 120/80 | Wt 186.0 lb

## 2013-02-09 DIAGNOSIS — N83209 Unspecified ovarian cyst, unspecified side: Secondary | ICD-10-CM

## 2013-02-09 MED ORDER — NORGESTIMATE-ETH ESTRADIOL 0.25-35 MG-MCG PO TABS: 1.0000 | ORAL_TABLET | Freq: Every day | ORAL | Status: DC

## 2013-02-09 NOTE — Progress Notes (Signed)
Patient ID: Bethany King, female   DOB: 25-Dec-1970, 42 y.o.   MRN: [id number] Pt had CT at ER 4.6 cm Causing LLQ pain persistent   Wants to try OCP for suppression Follow up in 2 months for pelvic sonogram

## 2013-02-23 ENCOUNTER — Other Ambulatory Visit: Payer: Self-pay | Admitting: Urology

## 2013-02-23 DIAGNOSIS — M5410 Radiculopathy, site unspecified: Secondary | ICD-10-CM

## 2013-02-23 DIAGNOSIS — R0789 Other chest pain: Secondary | ICD-10-CM

## 2013-03-03 ENCOUNTER — Ambulatory Visit (HOSPITAL_COMMUNITY)
Admission: RE | Admit: 2013-03-03 | Discharge: 2013-03-03 | Disposition: A | Discharge status: Home / Self Care | Payer: 59 | Source: Ambulatory Visit | Attending: Urology | Admitting: Urology

## 2013-03-03 DIAGNOSIS — D1809 Hemangioma of other sites: Secondary | ICD-10-CM | POA: Insufficient documentation

## 2013-03-03 DIAGNOSIS — M47814 Spondylosis without myelopathy or radiculopathy, thoracic region: Secondary | ICD-10-CM | POA: Insufficient documentation

## 2013-03-03 DIAGNOSIS — R0789 Other chest pain: Secondary | ICD-10-CM

## 2013-03-03 DIAGNOSIS — IMO0002 Reserved for concepts with insufficient information to code with codable children: Secondary | ICD-10-CM | POA: Insufficient documentation

## 2013-03-03 DIAGNOSIS — M5410 Radiculopathy, site unspecified: Secondary | ICD-10-CM

## 2013-03-03 DIAGNOSIS — R071 Chest pain on breathing: Secondary | ICD-10-CM | POA: Insufficient documentation

## 2013-03-03 DIAGNOSIS — M5124 Other intervertebral disc displacement, thoracic region: Secondary | ICD-10-CM | POA: Insufficient documentation

## 2013-04-11 ENCOUNTER — Ambulatory Visit: Payer: 59 | Admitting: Obstetrics & Gynecology

## 2013-04-11 ENCOUNTER — Other Ambulatory Visit: Payer: 59

## 2013-04-12 ENCOUNTER — Encounter: Payer: Self-pay | Admitting: Obstetrics & Gynecology

## 2013-04-12 ENCOUNTER — Ambulatory Visit (INDEPENDENT_AMBULATORY_CARE_PROVIDER_SITE_OTHER): Payer: 59

## 2013-04-12 ENCOUNTER — Ambulatory Visit (INDEPENDENT_AMBULATORY_CARE_PROVIDER_SITE_OTHER): Payer: 59 | Admitting: Obstetrics & Gynecology

## 2013-04-12 ENCOUNTER — Other Ambulatory Visit: Payer: Self-pay | Admitting: Obstetrics & Gynecology

## 2013-04-12 VITALS — BP 118/78 | Ht 63.0 in | Wt 185.5 lb

## 2013-04-12 DIAGNOSIS — N83209 Unspecified ovarian cyst, unspecified side: Secondary | ICD-10-CM

## 2013-04-12 NOTE — Progress Notes (Signed)
Patient ID: Bethany King, female   DOB: 12/14/1970, 42 y.o.   MRN: [id number] Sonogram reviewed and report done  Sonogram is normal with resolution of the left ovarian cyst seen on CT scan back in October  Follow up as needed

## 2013-04-18 DIAGNOSIS — Z0289 Encounter for other administrative examinations: Secondary | ICD-10-CM

## 2013-04-20 ENCOUNTER — Other Ambulatory Visit: Payer: Self-pay

## 2013-05-03 ENCOUNTER — Other Ambulatory Visit (HOSPITAL_COMMUNITY): Payer: Self-pay | Admitting: Family Medicine

## 2013-05-03 ENCOUNTER — Ambulatory Visit (HOSPITAL_COMMUNITY)
Admission: RE | Admit: 2013-05-03 | Discharge: 2013-05-03 | Disposition: A | Discharge status: Home / Self Care | Payer: 59 | Source: Ambulatory Visit | Attending: Family Medicine | Admitting: Family Medicine

## 2013-05-03 DIAGNOSIS — R0789 Other chest pain: Secondary | ICD-10-CM | POA: Insufficient documentation

## 2013-05-03 DIAGNOSIS — J209 Acute bronchitis, unspecified: Secondary | ICD-10-CM

## 2013-05-03 DIAGNOSIS — I10 Essential (primary) hypertension: Secondary | ICD-10-CM

## 2013-06-05 ENCOUNTER — Encounter: Payer: Self-pay | Admitting: Gastroenterology

## 2013-06-13 ENCOUNTER — Other Ambulatory Visit (HOSPITAL_COMMUNITY): Payer: Self-pay | Admitting: Family Medicine

## 2013-06-13 DIAGNOSIS — R109 Unspecified abdominal pain: Secondary | ICD-10-CM

## 2013-06-14 ENCOUNTER — Ambulatory Visit (HOSPITAL_COMMUNITY)
Admission: RE | Admit: 2013-06-14 | Discharge: 2013-06-14 | Disposition: A | Discharge status: Home / Self Care | Payer: 59 | Source: Ambulatory Visit | Attending: Family Medicine | Admitting: Family Medicine

## 2013-06-14 DIAGNOSIS — R109 Unspecified abdominal pain: Secondary | ICD-10-CM | POA: Insufficient documentation

## 2013-06-14 DIAGNOSIS — D1803 Hemangioma of intra-abdominal structures: Secondary | ICD-10-CM | POA: Insufficient documentation

## 2013-06-14 DIAGNOSIS — N200 Calculus of kidney: Secondary | ICD-10-CM | POA: Insufficient documentation

## 2013-06-14 DIAGNOSIS — K824 Cholesterolosis of gallbladder: Secondary | ICD-10-CM | POA: Insufficient documentation

## 2013-06-30 NOTE — H&P (Signed)
  NTS SOAP Note  Vital Signs:  Vitals as of: 4/48/1856: Systolic 314: Diastolic 83: Heart Rate 84: Temp 98.60F: Height 83ft 3in: Weight 189Lbs 0 Ounces: Pain Level 6: BMI 33.48  BMI : 33.48 kg/m2  Subjective: This 43 Years 51 Months old Female presents for of    ABDOMINAL ISSUES: ,Has been having intermittent right upper quadrant abdominal with radiation to right flank, nausea, and bloating for many months now.  Is occurring more frequently and is more constant in nature.  U/S of gallbladder reveals a small gallbladder polyp.  No fever, chills, jaundice.  Review of Symptoms:  Constitutional:unremarkable   Head:unremarkable    Eyes:unremarkable   Nose/Mouth/Throat:unremarkable Cardiovascular:  unremarkable   Respiratory:unremarkable   Gastrointestin    abdominal pain,nausea,vomiting,heartburn Genitourinary:unremarkable       back and joint pain Skin:unremarkable Hematolgic/Lymphatic:unremarkable       hay fever   Past Medical History:    Reviewed   Past Medical History  Surgical History: endometrial ablation, right foot surgery, carpal tunnel bilaterally, lithotripsy Medical Problems: back issue, costochonritis, reflux disease Allergies: sulfa, hydromorpine, ultram, oral prednisone Medications: vivbryd, alprazolam, HCTZ, prilosex, hydrocodone, flexeril, KCL, hyoscyamine, zofran, claritin, probiotiics   Social History:Reviewed  Social History  Preferred Language: English Race:  White Ethnicity: Not Hispanic / Latino Age: 43 Years 7 Months Marital Status:  D Alcohol:  No Recreational drug(s):  No   Smoking Status: Never smoker reviewed on 06/29/2013 Functional Status reviewed on 06/29/2013 ------------------------------------------------ Bathing: Normal Cooking: Normal Dressing: Normal Driving: Normal Eating: Normal Managing Meds: Normal Oral Care: Normal Shopping: Normal Toileting: Normal Transferring: Normal Walking:  Normal Cognitive Status reviewed on 06/29/2013 ------------------------------------------------ Attention: Normal Decision Making: Normal Language: Normal Memory: Normal Motor: Normal Perception: Normal Problem Solving: Normal Visual and Spatial: Normal   Family History:  [place] History Mother, Deceased; Tuberous sclerosis syndrome;  Father, Deceased; Malignant melanoma;     Objective Information: General:  Well appearing, well nourished in no distress.   no scleral icterus Heart:  RRR, no murmur Lungs:    CTA bilaterally, no wheezes, rhonchi, rales.  Breathing unlabored. Abdomen:Soft, slightly tender in right upper quadrant to palpation, ND, normal bowel sounds, no HSM, no masses.  No peritoneal signs.  Assessment:chronic cholecystitis    Plan:  Scheduled for laparoscopic cholecystectomy on 07/17/13.     Patient Education:Alternative treatments to surgery were discussed with patient (and family).  Risks and benefits  of procedure including bleeding, infection, hepatobiliary injury, and the possibility of an open procedure were fully explained to the patient (and family) who gave informed consent. Patient/family questions were addressed.  Follow-up:Pending Surgery

## 2013-07-03 ENCOUNTER — Encounter (HOSPITAL_COMMUNITY): Payer: Self-pay | Admitting: Pharmacy Technician

## 2013-07-10 ENCOUNTER — Encounter (HOSPITAL_COMMUNITY): Payer: Self-pay

## 2013-07-10 ENCOUNTER — Encounter (HOSPITAL_COMMUNITY)
Admission: RE | Admit: 2013-07-10 | Discharge: 2013-07-10 | Disposition: A | Discharge status: Home / Self Care | Payer: BC Managed Care – PPO | Source: Ambulatory Visit | Attending: General Surgery | Admitting: General Surgery

## 2013-07-10 DIAGNOSIS — Z01818 Encounter for other preprocedural examination: Secondary | ICD-10-CM | POA: Insufficient documentation

## 2013-07-10 DIAGNOSIS — Z01812 Encounter for preprocedural laboratory examination: Secondary | ICD-10-CM | POA: Insufficient documentation

## 2013-07-10 HISTORY — DX: Unspecified osteoarthritis, unspecified site: M19.90

## 2013-07-10 LAB — HEPATIC FUNCTION PANEL
ALT: 14 U/L (ref 0–35)
AST: 17 U/L (ref 0–37)
Albumin: 3.6 g/dL (ref 3.5–5.2)
Alkaline Phosphatase: 70 U/L (ref 39–117)
Total Bilirubin: 0.8 mg/dL (ref 0.3–1.2)
Total Protein: 6.5 g/dL (ref 6.0–8.3)

## 2013-07-10 LAB — BASIC METABOLIC PANEL
BUN: 11 mg/dL (ref 6–23)
CHLORIDE: 102 meq/L (ref 96–112)
CO2: 27 mEq/L (ref 19–32)
CREATININE: 0.72 mg/dL (ref 0.50–1.10)
Calcium: 9.3 mg/dL (ref 8.4–10.5)
GFR calc non Af Amer: >90 mL/min (ref >90)
Glucose, Bld: 94 mg/dL (ref 70–99)
Potassium: 3.1 mEq/L — ABNORMAL LOW (ref 3.7–5.3)
Sodium: 140 mEq/L (ref 137–147)

## 2013-07-10 LAB — CBC WITH DIFFERENTIAL/PLATELET
BASOS ABS: 0 10*3/uL (ref 0.0–0.1)
Basophils Relative: 1 % (ref 0–1)
Eosinophils Absolute: 0.1 10*3/uL (ref 0.0–0.7)
Eosinophils Relative: 2 % (ref 0–5)
HCT: 35 % — ABNORMAL LOW (ref 36.0–46.0)
Hemoglobin: 11.7 g/dL — ABNORMAL LOW (ref 12.0–15.0)
Lymphocytes Relative: 30 % (ref 12–46)
Lymphs Abs: 2 10*3/uL (ref 0.7–4.0)
MCH: 29 pg (ref 26.0–34.0)
MCHC: 33.4 g/dL (ref 30.0–36.0)
MCV: 86.8 fL (ref 78.0–100.0)
Monocytes Absolute: 0.5 10*3/uL (ref 0.1–1.0)
Monocytes Relative: 7 % (ref 3–12)
NEUTROS ABS: 4 10*3/uL (ref 1.7–7.7)
Neutrophils Relative %: 61 % (ref 43–77)
Platelets: 300 10*3/uL (ref 150–400)
RBC: 4.03 MIL/uL (ref 3.87–5.11)
RDW: 13.7 % (ref 11.5–15.5)
WBC: 6.5 10*3/uL (ref 4.0–10.5)

## 2013-07-10 LAB — HCG, SERUM, QUALITATIVE: PREG SERUM: NEGATIVE

## 2013-07-10 MED ORDER — CLINDAMYCIN PHOSPHATE 600 MG/50ML IV SOLN: 600.0000 mg | Freq: Once | INTRAVENOUS | Status: DC

## 2013-07-10 NOTE — Patient Instructions (Signed)
Bethany King  07/10/2013   Your procedure is scheduled on:  07/14/2013  Report to [facility] at  48  AM.  Call this number if you have problems the morning of surgery: [phone]   Remember:   Do not eat food or drink liquids after midnight.   Take these medicines the morning of surgery with A SIP OF WATER: losartan   Do not wear jewelry, make-up or nail polish.  Do not wear lotions, powders, or perfumes.   Do not shave 48 hours prior to surgery. Men may shave face and neck.  Do not bring valuables to the hospital.  [facility] is not responsible for any belongings or valuables.               Contacts, dentures or bridgework may not be worn into surgery.  Leave suitcase in the car. After surgery it may be brought to your room.  For patients admitted to the hospital, discharge time is determined by your treatment team.               Patients discharged the day of surgery will not be allowed to drive home.  Name and phone number of your driver:   Special Instructions: Shower using CHG 2 nights before surgery and the night before surgery.  If you shower the day of surgery use CHG.  Use special wash - you have one bottle of CHG for all showers.  You should use approximately 1/3 of the bottle for each shower.   Please read over the following fact sheets that you were given: Pain Booklet, Coughing and Deep Breathing, Surgical Site Infection Prevention, Anesthesia Post-op Instructions and Care and Recovery After Surgery Laparoscopic Cholecystectomy Laparoscopic cholecystectomy is surgery to remove the gallbladder. The gallbladder is located in the upper right part of the abdomen, behind the liver. It is a storage sac for bile produced in the liver. Bile aids in the digestion and absorption of fats. Cholecystectomy is often done for inflammation of the gallbladder (cholecystitis). This condition is usually caused by a buildup of gallstones (cholelithiasis) in your gallbladder.  Gallstones can block the flow of bile, resulting in inflammation and pain. In severe cases, emergency surgery may be required. When emergency surgery is not required, you will have time to prepare for the procedure. Laparoscopic surgery is an alternative to open surgery. Laparoscopic surgery has a shorter recovery time. Your common bile duct may also need to be examined during the procedure. If stones are found in the common bile duct, they may be removed. LET [facility] CARE PROVIDER KNOW ABOUT:  Any allergies you have.  All medicines you are taking, including vitamins, herbs, eye drops, creams, and over-the-counter medicines.  Previous problems you or members of your family have had with the use of anesthetics.  Any blood disorders you have.  Previous surgeries you have had.  Medical conditions you have. RISKS AND COMPLICATIONS Generally, this is a safe procedure. However, as with any procedure, complications can occur. Possible complications include:  Infection.  Damage to the common bile duct, nerves, arteries, veins, or other internal organs such as the stomach, liver, or intestines.  Bleeding.  A stone may remain in the common bile duct.  A bile leak from the cyst duct that is clipped when your gallbladder is removed.  The need to convert to open surgery, which requires a larger incision in the abdomen. This may be necessary if your surgeon thinks it is not  safe to continue with a laparoscopic procedure. BEFORE THE PROCEDURE  Ask your health care provider about changing or stopping any regular medicines. You will need to stop taking aspirin or blood thinners at least 5 days prior to surgery.  Do not eat or drink anything after midnight the night before surgery.  Let your health care provider know if you develop a cold or other infectious problem before surgery. PROCEDURE   You will be given medicine to make you sleep through the procedure (general anesthetic). A breathing  tube will be placed in your mouth.  When you are asleep, your surgeon will make several small cuts (incisions) in your abdomen.  A thin, lighted tube with a tiny camera on the end (laparoscope) is inserted through one of the small incisions. The camera on the laparoscope sends a picture to a TV screen in the operating room. This gives the surgeon a good view inside your abdomen.  A gas will be pumped into your abdomen. This expands your abdomen so that the surgeon has more room to perform the surgery.  Other tools needed for the procedure are inserted through the other incisions. The gallbladder is removed through one of the incisions.  After the removal of your gallbladder, the incisions will be closed with stitches, staples, or skin glue. AFTER THE PROCEDURE  You will be taken to a recovery area where your progress will be checked often.  You may be allowed to go home the same day if your pain is controlled and you can tolerate liquids. Document Released: 06/01/2005 Document Revised: 03/22/2013 Document Reviewed: 01/11/2013 [facility] Patient Information 2014 [place]. PATIENT INSTRUCTIONS POST-ANESTHESIA  IMMEDIATELY FOLLOWING SURGERY:  Do not drive or operate machinery for the first twenty four hours after surgery.  Do not make any important decisions for twenty four hours after surgery or while taking narcotic pain medications or sedatives.  If you develop intractable nausea and vomiting or a severe headache please notify your doctor immediately.  FOLLOW-UP:  Please make an appointment with your surgeon as instructed. You do not need to follow up with anesthesia unless specifically instructed to do so.  WOUND CARE INSTRUCTIONS (if applicable):  Keep a dry clean dressing on the anesthesia/puncture wound site if there is drainage.  Once the wound has quit draining you may leave it open to air.  Generally you should leave the bandage intact for twenty four hours unless there is  drainage.  If the epidural site drains for more than 36-48 hours please call the anesthesia department.  QUESTIONS?:  Please feel free to call your physician or the hospital operator if you have any questions, and they will be happy to assist you.

## 2013-07-11 NOTE — Pre-Procedure Instructions (Signed)
Potassium 3.1. Dr Patsey Berthold notified and patient called and instructed to take potassium 10 mEq qid instead of daily until am of surgery and will recheck K+ at that time. She verbalized understanding of this.

## 2013-07-14 ENCOUNTER — Ambulatory Visit (HOSPITAL_COMMUNITY): Payer: BC Managed Care – PPO | Admitting: Anesthesiology

## 2013-07-14 ENCOUNTER — Encounter (HOSPITAL_COMMUNITY)
Admission: RE | Disposition: A | Discharge status: Home / Self Care | Payer: Self-pay | Source: Ambulatory Visit | Attending: General Surgery

## 2013-07-14 ENCOUNTER — Encounter (HOSPITAL_COMMUNITY): Payer: Self-pay | Admitting: *Deleted

## 2013-07-14 ENCOUNTER — Ambulatory Visit (HOSPITAL_COMMUNITY)
Admission: RE | Admit: 2013-07-14 | Discharge: 2013-07-14 | Disposition: A | Discharge status: Home / Self Care | Payer: BC Managed Care – PPO | Source: Ambulatory Visit | Attending: General Surgery | Admitting: General Surgery

## 2013-07-14 ENCOUNTER — Encounter (HOSPITAL_COMMUNITY): Payer: BC Managed Care – PPO | Admitting: Anesthesiology

## 2013-07-14 DIAGNOSIS — K811 Chronic cholecystitis: Secondary | ICD-10-CM | POA: Insufficient documentation

## 2013-07-14 DIAGNOSIS — I10 Essential (primary) hypertension: Secondary | ICD-10-CM | POA: Insufficient documentation

## 2013-07-14 DIAGNOSIS — Z01812 Encounter for preprocedural laboratory examination: Secondary | ICD-10-CM | POA: Insufficient documentation

## 2013-07-14 HISTORY — PX: CHOLECYSTECTOMY: SHX55

## 2013-07-14 SURGERY — LAPAROSCOPIC CHOLECYSTECTOMY
Anesthesia: General

## 2013-07-14 MED ORDER — POVIDONE-IODINE 10 % OINT PACKET
TOPICAL_OINTMENT | CUTANEOUS | Status: DC | PRN
  Administered 2013-07-14: 1 via TOPICAL

## 2013-07-14 MED ORDER — SCOPOLAMINE 1 MG/3DAYS TD PT72
1.0000 | MEDICATED_PATCH | Freq: Once | TRANSDERMAL | Status: DC
  Administered 2013-07-14: 1.5 mg via TRANSDERMAL

## 2013-07-14 MED ORDER — GLYCOPYRROLATE 0.2 MG/ML IJ SOLN
INTRAMUSCULAR | Status: AC
  Filled 2013-07-14: qty 1

## 2013-07-14 MED ORDER — LIDOCAINE HCL (PF) 1 % IJ SOLN
INTRAMUSCULAR | Status: AC
  Filled 2013-07-14: qty 5

## 2013-07-14 MED ORDER — ONDANSETRON HCL 4 MG/2ML IJ SOLN
INTRAMUSCULAR | Status: AC
  Filled 2013-07-14: qty 2

## 2013-07-14 MED ORDER — FENTANYL CITRATE 0.05 MG/ML IJ SOLN
INTRAMUSCULAR | Status: DC | PRN
  Administered 2013-07-14: 150 ug via INTRAVENOUS
  Administered 2013-07-14: 50 ug via INTRAVENOUS
  Administered 2013-07-14 (×2): 100 ug via INTRAVENOUS
  Administered 2013-07-14: 50 ug via INTRAVENOUS

## 2013-07-14 MED ORDER — ROCURONIUM BROMIDE 50 MG/5ML IV SOLN
INTRAVENOUS | Status: AC
  Filled 2013-07-14: qty 1

## 2013-07-14 MED ORDER — FENTANYL CITRATE 0.05 MG/ML IJ SOLN
INTRAMUSCULAR | Status: AC
  Filled 2013-07-14: qty 2

## 2013-07-14 MED ORDER — ONDANSETRON HCL 4 MG/2ML IJ SOLN
4.0000 mg | Freq: Once | INTRAMUSCULAR | Status: AC
  Administered 2013-07-14: 4 mg via INTRAVENOUS

## 2013-07-14 MED ORDER — SODIUM CHLORIDE 0.9 % IR SOLN
Status: DC | PRN
  Administered 2013-07-14: 1000 mL

## 2013-07-14 MED ORDER — GLYCOPYRROLATE 0.2 MG/ML IJ SOLN
0.2000 mg | Freq: Once | INTRAMUSCULAR | Status: AC
  Administered 2013-07-14: 0.2 mg via INTRAVENOUS

## 2013-07-14 MED ORDER — CLINDAMYCIN PHOSPHATE 600 MG/50ML IV SOLN
600.0000 mg | Freq: Once | INTRAVENOUS | Status: AC
  Administered 2013-07-14: 600 mg via INTRAVENOUS
  Filled 2013-07-14: qty 50

## 2013-07-14 MED ORDER — MIDAZOLAM HCL 2 MG/2ML IJ SOLN
INTRAMUSCULAR | Status: AC
  Filled 2013-07-14: qty 2

## 2013-07-14 MED ORDER — GLYCOPYRROLATE 0.2 MG/ML IJ SOLN
INTRAMUSCULAR | Status: DC | PRN
  Administered 2013-07-14 (×2): 0.2 mg via INTRAVENOUS

## 2013-07-14 MED ORDER — HYDROCODONE-ACETAMINOPHEN 10-325 MG PO TABS: 1.0000 | ORAL_TABLET | ORAL | Status: DC | PRN

## 2013-07-14 MED ORDER — MIDAZOLAM HCL 2 MG/2ML IJ SOLN
1.0000 mg | INTRAMUSCULAR | Status: AC | PRN
  Administered 2013-07-14 (×3): 2 mg via INTRAVENOUS
  Filled 2013-07-14: qty 2

## 2013-07-14 MED ORDER — HEMOSTATIC AGENTS (NO CHARGE) OPTIME
TOPICAL | Status: DC | PRN
  Administered 2013-07-14: 1 via TOPICAL

## 2013-07-14 MED ORDER — BUPIVACAINE HCL (PF) 0.5 % IJ SOLN
INTRAMUSCULAR | Status: AC
  Filled 2013-07-14: qty 30

## 2013-07-14 MED ORDER — ROCURONIUM BROMIDE 100 MG/10ML IV SOLN
INTRAVENOUS | Status: DC | PRN
Start: 2013-07-14 — End: 2013-07-14
  Administered 2013-07-14: 30 mg via INTRAVENOUS

## 2013-07-14 MED ORDER — MIDAZOLAM HCL 2 MG/2ML IJ SOLN
INTRAMUSCULAR | Status: AC
  Filled 2013-07-14: qty 4

## 2013-07-14 MED ORDER — KETOROLAC TROMETHAMINE 30 MG/ML IJ SOLN
30.0000 mg | Freq: Once | INTRAMUSCULAR | Status: AC
  Administered 2013-07-14: 30 mg via INTRAVENOUS
  Filled 2013-07-14: qty 1

## 2013-07-14 MED ORDER — BUPIVACAINE HCL (PF) 0.5 % IJ SOLN
INTRAMUSCULAR | Status: DC | PRN
  Administered 2013-07-14: 10 mL

## 2013-07-14 MED ORDER — ONDANSETRON HCL 4 MG/2ML IJ SOLN: 4.0000 mg | Freq: Once | INTRAMUSCULAR | Status: DC | PRN

## 2013-07-14 MED ORDER — SCOPOLAMINE 1 MG/3DAYS TD PT72
MEDICATED_PATCH | TRANSDERMAL | Status: AC
  Filled 2013-07-14: qty 1

## 2013-07-14 MED ORDER — POVIDONE-IODINE 10 % EX OINT
TOPICAL_OINTMENT | CUTANEOUS | Status: AC
  Filled 2013-07-14: qty 1

## 2013-07-14 MED ORDER — SUCCINYLCHOLINE CHLORIDE 20 MG/ML IJ SOLN
INTRAMUSCULAR | Status: AC
  Filled 2013-07-14: qty 1

## 2013-07-14 MED ORDER — MIDAZOLAM HCL 5 MG/5ML IJ SOLN
INTRAMUSCULAR | Status: DC | PRN
Start: 2013-07-14 — End: 2013-07-14
  Administered 2013-07-14: 2 mg via INTRAVENOUS

## 2013-07-14 MED ORDER — LACTATED RINGERS IV SOLN
INTRAVENOUS | Status: DC
Start: 2013-07-14 — End: 2013-07-14
  Administered 2013-07-14 (×2): via INTRAVENOUS

## 2013-07-14 MED ORDER — FENTANYL CITRATE 0.05 MG/ML IJ SOLN
25.0000 ug | INTRAMUSCULAR | Status: DC | PRN
  Administered 2013-07-14 (×8): 25 ug via INTRAVENOUS
  Filled 2013-07-14 (×2): qty 2

## 2013-07-14 MED ORDER — SUCCINYLCHOLINE CHLORIDE 20 MG/ML IJ SOLN
INTRAMUSCULAR | Status: DC | PRN
  Administered 2013-07-14: 140 mg via INTRAVENOUS

## 2013-07-14 MED ORDER — CHLORHEXIDINE GLUCONATE 4 % EX LIQD: 1.0000 "application " | Freq: Once | CUTANEOUS | Status: DC

## 2013-07-14 MED ORDER — GLYCOPYRROLATE 0.2 MG/ML IJ SOLN
INTRAMUSCULAR | Status: AC
  Filled 2013-07-14: qty 2

## 2013-07-14 MED ORDER — PROPOFOL 10 MG/ML IV BOLUS
INTRAVENOUS | Status: DC | PRN
  Administered 2013-07-14: 170 mg via INTRAVENOUS

## 2013-07-14 MED ORDER — PROPOFOL 10 MG/ML IV BOLUS
INTRAVENOUS | Status: AC
  Filled 2013-07-14: qty 20

## 2013-07-14 MED ORDER — NEOSTIGMINE METHYLSULFATE 1 MG/ML IJ SOLN
INTRAMUSCULAR | Status: DC | PRN
  Administered 2013-07-14: 2 mg via INTRAVENOUS
  Administered 2013-07-14: 1 mg via INTRAVENOUS

## 2013-07-14 MED ORDER — FENTANYL CITRATE 0.05 MG/ML IJ SOLN
INTRAMUSCULAR | Status: AC
  Filled 2013-07-14: qty 5

## 2013-07-14 MED ORDER — LIDOCAINE HCL 1 % IJ SOLN
INTRAMUSCULAR | Status: DC | PRN
  Administered 2013-07-14: 50 mg via INTRADERMAL

## 2013-07-14 SURGICAL SUPPLY — 44 items
APPLIER CLIP LAPSCP 10X32 DD (CLIP) ×2 IMPLANT
BAG HAMPER (MISCELLANEOUS) ×2 IMPLANT
BAG SPEC RTRVL LRG 6X4 10 (ENDOMECHANICALS) ×1
CLOTH BEACON ORANGE TIMEOUT ST (SAFETY) ×2 IMPLANT
COVER LIGHT HANDLE STERIS (MISCELLANEOUS) ×4 IMPLANT
DECANTER SPIKE VIAL GLASS SM (MISCELLANEOUS) ×2 IMPLANT
DURAPREP 26ML APPLICATOR (WOUND CARE) ×2 IMPLANT
ELECT REM PT RETURN 9FT ADLT (ELECTROSURGICAL) ×2
ELECTRODE REM PT RTRN 9FT ADLT (ELECTROSURGICAL) ×1 IMPLANT
FILTER SMOKE EVAC LAPAROSHD (FILTER) ×2 IMPLANT
FORMALIN 10 PREFIL 120ML (MISCELLANEOUS) ×2 IMPLANT
GLOVE BIO SURGEON STRL SZ7.5 (GLOVE) ×2 IMPLANT
GLOVE BIOGEL PI IND STRL 7.0 (GLOVE) IMPLANT
GLOVE BIOGEL PI IND STRL 8 (GLOVE) ×1 IMPLANT
GLOVE BIOGEL PI INDICATOR 7.0 (GLOVE) ×2
GLOVE BIOGEL PI INDICATOR 8 (GLOVE) ×1
GLOVE ECLIPSE 8.5 STRL (GLOVE) ×1 IMPLANT
GLOVE SS BIOGEL STRL SZ 6.5 (GLOVE) IMPLANT
GLOVE SUPERSENSE BIOGEL SZ 6.5 (GLOVE) ×2
GOWN STRL REUS W/TWL LRG LVL3 (GOWN DISPOSABLE) ×6 IMPLANT
GOWN STRL REUS W/TWL XL LVL3 (GOWN DISPOSABLE) ×1 IMPLANT
HEMOSTAT SNOW SURGICEL 2X4 (HEMOSTASIS) ×2 IMPLANT
INST SET LAPROSCOPIC AP (KITS) ×2 IMPLANT
IV NS IRRIG 3000ML ARTHROMATIC (IV SOLUTION) IMPLANT
KIT ROOM TURNOVER APOR (KITS) ×2 IMPLANT
MANIFOLD NEPTUNE II (INSTRUMENTS) ×2 IMPLANT
NDL INSUFFLATION 14GA 120MM (NEEDLE) ×1 IMPLANT
NEEDLE INSUFFLATION 14GA 120MM (NEEDLE) ×2 IMPLANT
NS IRRIG 1000ML POUR BTL (IV SOLUTION) ×2 IMPLANT
PACK LAP CHOLE LZT030E (CUSTOM PROCEDURE TRAY) ×2 IMPLANT
PAD ARMBOARD 7.5X6 YLW CONV (MISCELLANEOUS) ×2 IMPLANT
POUCH SPECIMEN RETRIEVAL 10MM (ENDOMECHANICALS) ×2 IMPLANT
SET BASIN LINEN APH (SET/KITS/TRAYS/PACK) ×2 IMPLANT
SET TUBE IRRIG SUCTION NO TIP (IRRIGATION / IRRIGATOR) IMPLANT
SLEEVE ENDOPATH XCEL 5M (ENDOMECHANICALS) ×2 IMPLANT
SPONGE GAUZE 2X2 8PLY STRL LF (GAUZE/BANDAGES/DRESSINGS) ×8 IMPLANT
STAPLER VISISTAT (STAPLE) ×2 IMPLANT
SUT VICRYL 0 UR6 27IN ABS (SUTURE) ×2 IMPLANT
TROCAR ENDO BLADELESS 11MM (ENDOMECHANICALS) ×2 IMPLANT
TROCAR XCEL NON-BLD 5MMX100MML (ENDOMECHANICALS) ×2 IMPLANT
TROCAR XCEL UNIV SLVE 11M 100M (ENDOMECHANICALS) ×2 IMPLANT
TUBING INSUFFLATION (TUBING) ×2 IMPLANT
WARMER LAPAROSCOPE (MISCELLANEOUS) ×2 IMPLANT
YANKAUER SUCT 12FT TUBE ARGYLE (SUCTIONS) ×2 IMPLANT

## 2013-07-14 NOTE — Op Note (Signed)
Patient:  Bethany King  DOB:  Dec 12, 1970  MRN:  [id number]   Preop Diagnosis:  Chronic cholecystitis  Postop Diagnosis:  Same  Procedure:  Laparoscopic cholecystectomy  Surgeon:  Aviva Signs, M.D.  Anes:  General endotracheal  Indications:  Patient is a 43 year old white female presents with chronic cholecystitis. The risks and benefits of the procedure including bleeding, infection, hepatobiliary injury, and the possibility of an open procedure were fully explained to the patient, who gave informed consent.  Procedure note:  The patient is placed the supine position. After induction of general endotracheal anesthesia, the abdomen was prepped and draped using usual sterile technique with DuraPrep. Surgical site confirmation was performed.  A supraumbilical incision was made down to the fascia. A Veress needle was introduced into the abdominal cavity and confirmation of placement was done using the saline drop test. The abdomen was then insufflated to 16 mm mercury pressure. An 11 mm trocar was introduced the abdominal cavity under direct visualization without difficulty. The patient was placed in reverse Trendelenburg position and additional 11 mm trocar was placed the epigastric region and 5 mm trochars were placed the right upper quadrant and right flank regions. The liver was inspected and noted within normal limits. The gallbladder was retracted in a dynamic fashion in order to expose the triangle of Calot. The cystic duct was first identified. Its juncture to the infundibulum was fully identified. Endoclips placed proximally and distally on the cystic duct, and the cystic duct was divided. This was likewise done to the cystic artery. The gallbladder was then freed away from the gallbladder fossa using Bovie electrocautery. The gallbladder was delivered through the epigastric trocar site using an Endo Catch bag.  The gallbladder fossa was inspected and no abnormal bleeding or bile leakage  was noted. Surgicel is placed the gallbladder fossa. All fluid and air were then evacuated from the abdominal cavity prior to removal of the trochars.  All wounds were irrigated with normal saline. All wounds were injected with 0.5% Sensorcaine. The supraumbilical fascia was reapproximated using an 0 Vicryl interrupted suture. All skin incisions were closed using staples. Betadine ointment and dry sterile dressings were applied.  All tape and needle counts were correct the end of the procedure. Patient was extubated in the operating room and transferred to PACU in stable condition.  Complications:  None  EBL:  Minimal  Specimen:  Gallbladder

## 2013-07-14 NOTE — Anesthesia Preprocedure Evaluation (Signed)
Anesthesia Evaluation  Patient identified by MRN, date of birth, ID band Patient awake    Reviewed: Allergy & Precautions, H&P , NPO status , Patient's Chart, lab work & pertinent test results  History of Anesthesia Complications (+) PONV and history of anesthetic complications  Airway Mallampati: I TM Distance: >3 FB     Dental  (+) Teeth Intact   Pulmonary neg pulmonary ROS,  breath sounds clear to auscultation        Cardiovascular hypertension, Pt. on medications Rhythm:Regular Rate:Normal     Neuro/Psych  Headaches, PSYCHIATRIC DISORDERS Anxiety Depression  Neuromuscular disease    GI/Hepatic GERD-  Medicated and Controlled,  Endo/Other    Renal/GU      Musculoskeletal   Abdominal   Peds  Hematology   Anesthesia Other Findings   Reproductive/Obstetrics                           Anesthesia Physical Anesthesia Plan  ASA: II  Anesthesia Plan: General   Post-op Pain Management:    Induction: Intravenous, Rapid sequence and Cricoid pressure planned  Airway Management Planned: Oral ETT  Additional Equipment:   Intra-op Plan:   Post-operative Plan: Extubation in OR  Informed Consent: I have reviewed the patients History and Physical, chart, labs and discussed the procedure including the risks, benefits and alternatives for the proposed anesthesia with the patient or authorized representative who has indicated his/her understanding and acceptance.     Plan Discussed with:   Anesthesia Plan Comments:         Anesthesia Quick Evaluation

## 2013-07-14 NOTE — Discharge Instructions (Signed)
Laparoscopic Cholecystectomy, Care After °Refer to this sheet in the next few weeks. These instructions provide you with information on caring for yourself after your procedure. Your health care provider may also give you more specific instructions. Your treatment has been planned according to current medical practices, but problems sometimes occur. Call your health care provider if you have any problems or questions after your procedure. °WHAT TO EXPECT AFTER THE PROCEDURE °After your procedure, it is typical to have the following: °· Pain at your incision sites. You will be given pain medicines to control the pain. °· Mild nausea or vomiting. This should improve after the first 24 hours. °· Bloating and possibly shoulder pain from the gas used during the procedure. This will improve after the first 24 hours. °HOME CARE INSTRUCTIONS  °· Change bandages (dressings) as directed by your health care provider. °· Keep the wound dry and clean. You may wash the wound gently with soap and water. Gently blot or dab the area dry. °· Do not take baths or use swimming pools or hot tubs for 2 weeks or until your health care provider approves. °· Only take over-the-counter or prescription medicines as directed by your health care provider. °· Continue your normal diet as directed by your health care provider. °· Do not lift anything heavier than 10 pounds (4.5 kg) until your health care provider approves. °· Do not play contact sports for 1 week or until your health care provider approves. °SEEK MEDICAL CARE IF:  °· You have redness, swelling, or increasing pain in the wound. °· You notice yellowish-white fluid (pus) coming from the wound. °· You have drainage from the wound that lasts longer than 1 day. °· You notice a bad smell coming from the wound or dressing. °· Your surgical cuts (incisions) break open. °SEEK IMMEDIATE MEDICAL CARE IF:  °· You develop a rash. °· You have difficulty breathing. °· You have chest pain. °· You  have a fever. °· You have increasing pain in the shoulders (shoulder strap areas). °· You have dizzy episodes or faint while standing. °· You have severe abdominal pain. °· You feel sick to your stomach (nauseous) or throw up (vomit) and this lasts for more than 1 day. °Document Released: 06/01/2005 Document Revised: 03/22/2013 Document Reviewed: 01/11/2013 °ExitCare® Patient Information ©2014 [employer]. ° °

## 2013-07-14 NOTE — Anesthesia Procedure Notes (Addendum)
Procedure Name: Intubation Date/Time: 07/14/2013 10:27 AM Performed by: Charmaine Downs Pre-anesthesia Checklist: Patient being monitored, Suction available, Emergency Drugs available and Patient identified Patient Re-evaluated:Patient Re-evaluated prior to inductionOxygen Delivery Method: Circle system utilized Preoxygenation: Pre-oxygenation with 100% oxygen Intubation Type: IV induction, Cricoid Pressure applied and Rapid sequence Ventilation: Mask ventilation without difficulty Laryngoscope Size: Mac and 3 Grade View: Grade I Tube type: Oral Number of attempts: 1 Airway Equipment and Method: Stylet and Oral airway Placement Confirmation: positive ETCO2,  breath sounds checked- equal and bilateral and ETT inserted through vocal cords under direct vision Secured at: 20 cm Tube secured with: Tape    Performed by: Bernette Redbird J Dental Injury: Teeth and Oropharynx as per pre-operative assessment

## 2013-07-14 NOTE — Anesthesia Postprocedure Evaluation (Signed)
  Anesthesia Post-op Note  Patient: Bethany King  Procedure(s) Performed: Procedure(s): LAPAROSCOPIC CHOLECYSTECTOMY (N/A)  Patient Location: PACU  Anesthesia Type:General  Level of Consciousness: awake, alert , oriented and patient cooperative  Airway and Oxygen Therapy: Patient Spontanous Breathing  Post-op Pain: 3 /10, mild  Post-op Assessment: Post-op Vital signs reviewed, Patient's Cardiovascular Status Stable, Respiratory Function Stable, Patent Airway, No signs of Nausea or vomiting and Pain level controlled  Post-op Vital Signs: Reviewed and stable  Complications: No apparent anesthesia complications

## 2013-07-14 NOTE — Transfer of Care (Signed)
Immediate Anesthesia Transfer of Care Note  Patient: Bethany King  Procedure(s) Performed: Procedure(s): LAPAROSCOPIC CHOLECYSTECTOMY (N/A)  Patient Location: PACU  Anesthesia Type:General  Level of Consciousness: awake and patient cooperative  Airway & Oxygen Therapy: Patient Spontanous Breathing and Patient connected to face mask oxygen  Post-op Assessment: Report given to PACU RN, Post -op Vital signs reviewed and stable and Patient moving all extremities  Post vital signs: Reviewed and stable  Complications: No apparent anesthesia complications

## 2013-07-14 NOTE — Progress Notes (Signed)
I-Stat was obtained in pre-op before pt's scheduled lap chole and showed a K+ level of 3.1. Dr. Patsey Berthold notified, he gave okay to proceed with surgery, no further orders given.

## 2013-07-14 NOTE — Interval H&P Note (Signed)
History and Physical Interval Note:  07/14/2013 9:47 AM  Bethany King  has presented today for surgery, with the diagnosis of chronic cholecystitis  The various methods of treatment have been discussed with the patient and family. After consideration of risks, benefits and other options for treatment, the patient has consented to  Procedure(s): LAPAROSCOPIC CHOLECYSTECTOMY (N/A) as a surgical intervention .  The patient's history has been reviewed, patient examined, no change in status, stable for surgery.  I have reviewed the patient's chart and labs.  Questions were answered to the patient's satisfaction.     Aviva Signs A

## 2013-07-17 ENCOUNTER — Encounter (HOSPITAL_COMMUNITY): Payer: Self-pay | Admitting: General Surgery

## 2013-07-17 LAB — POCT I-STAT 4, (NA,K, GLUC, HGB,HCT)
Glucose, Bld: 84 mg/dL (ref 70–99)
HCT: 36 % (ref 36.0–46.0)
Hemoglobin: 12.2 g/dL (ref 12.0–15.0)
Potassium: 3.1 mEq/L — ABNORMAL LOW (ref 3.7–5.3)
SODIUM: 142 meq/L (ref 137–147)

## 2013-10-06 ENCOUNTER — Other Ambulatory Visit: Payer: 59 | Admitting: Obstetrics & Gynecology

## 2013-10-16 ENCOUNTER — Encounter: Payer: Self-pay | Admitting: Obstetrics & Gynecology

## 2013-10-16 ENCOUNTER — Other Ambulatory Visit (HOSPITAL_COMMUNITY)
Admission: RE | Admit: 2013-10-16 | Discharge: 2013-10-16 | Disposition: A | Discharge status: Home / Self Care | Payer: BC Managed Care – PPO | Source: Ambulatory Visit | Attending: Obstetrics & Gynecology | Admitting: Obstetrics & Gynecology

## 2013-10-16 ENCOUNTER — Ambulatory Visit (INDEPENDENT_AMBULATORY_CARE_PROVIDER_SITE_OTHER): Payer: BC Managed Care – PPO | Admitting: Obstetrics & Gynecology

## 2013-10-16 VITALS — BP 128/80 | Ht 63.0 in | Wt 184.0 lb

## 2013-10-16 DIAGNOSIS — Z01419 Encounter for gynecological examination (general) (routine) without abnormal findings: Secondary | ICD-10-CM

## 2013-10-16 DIAGNOSIS — Z1151 Encounter for screening for human papillomavirus (HPV): Secondary | ICD-10-CM | POA: Insufficient documentation

## 2013-10-16 NOTE — Progress Notes (Signed)
Patient ID: Bethany King, female   DOB: [birth year], 43 y.o.   MRN: [id number] Subjective:     Bethany King is a 43 y.o. female here for a routine exam.  No LMP recorded. Patient has had an ablation. G0P0 Birth Control Method:  Ablation  Menstrual Calendar(currently): monthly  Current complaints: none.   Current acute medical issues:  none   Recent Gynecologic History No LMP recorded. Patient has had an ablation. Last Pap: 2014,  normal Last mammogram: 2015,  normal  Past Medical History  Diagnosis Date  . GERD (gastroesophageal reflux disease)   . Irritable bowel syndrome (IBS)   . HTN (hypertension)   . Anxiety   . Depression   . Multiple hemangiomas 2006    hepatic, stable  . Costochondritis   . S/P endoscopy April 2012    mild gastritis, benign gastric polyp  . S/P colonoscopy April 2012    internal hemorrhoids, simple adenoma  . Iron deficiency anemia   . Hepatic hemangioma   . Cervical disc herniation   . Nephrolithiasis 2006    w/ hydronephrosis  . Recurrent UTI   . Medullary sponge kidney     left  . Complication of anesthesia   . PONV (postoperative nausea and vomiting)   . Pain     left leg  . Arthritis     Past Surgical History  Procedure Laterality Date  . Carpel tunnel release  03/2010 and 05/2010    bilateral  . Kidney stone surgery      numerou-s esl  . Foot surgery Bilateral     right- plantar fasciotomy  . Esophagogastroduodenoscopy  10/14/10  . Dilitation & currettage/hystroscopy with thermachoice ablation N/A 09/07/2012    Procedure: DILATATION & CURETTAGE/HYSTEROSCOPY WITH THERMACHOICE ABLATION;  Surgeon: Lazaro Arms, MD;  Location: [facility];  Service: Gynecology;  Laterality: N/A;  . Endometrial ablation  2014  . Cholecystectomy N/A 07/14/2013    Procedure: LAPAROSCOPIC CHOLECYSTECTOMY;  Surgeon: Dalia Heading, MD;  Location: [facility];  Service: General;  Laterality: N/A;    OB History   Grav Para Term Preterm Abortions TAB SAB Ect  Mult Living   0               History   Social History  . Marital Status: Divorced    Spouse Name: N/A    Number of Children: 0  . Years of Education: N/A   Occupational History  . ORDER PROCESSER [employer]   Social History Main Topics  . Smoking status: Never Smoker   . Smokeless tobacco: Never Used  . Alcohol Use: No  . Drug Use: No  . Sexual Activity: Not Currently    Birth Control/ Protection: Surgical   Other Topics Concern  . None   Social History Narrative  . None    Family History  Problem Relation Age of Onset  . Irritable bowel syndrome Mother   . Scleroderma Mother   . Rheum arthritis Mother   . Asthma Mother   . Colon cancer Cousin 20    second cousin Nettie Elm [place])  . Melanoma Father   . Cancer Father     Melanoma  . Cancer Maternal Grandmother     Ovarian  . Diabetes Maternal Grandmother   . Thyroid disease Maternal Aunt      Review of Systems  Review of Systems  Constitutional: Negative for fever, chills, weight loss, malaise/fatigue and diaphoresis.  HENT: Negative for hearing loss, ear pain, nosebleeds,  congestion, sore throat, neck pain, tinnitus and ear discharge.   Eyes: Negative for blurred vision, double vision, photophobia, pain, discharge and redness.  Respiratory: Negative for cough, hemoptysis, sputum production, shortness of breath, wheezing and stridor.   Cardiovascular: Negative for chest pain, palpitations, orthopnea, claudication, leg swelling and PND.  Gastrointestinal: negative for abdominal pain. Negative for heartburn, nausea, vomiting, diarrhea, constipation, blood in stool and melena.  Genitourinary: Negative for dysuria, urgency, frequency, hematuria and flank pain.  Musculoskeletal: Negative for myalgias, back pain, joint pain and falls.  Skin: Negative for itching and rash.  Neurological: Negative for dizziness, tingling, tremors, sensory change, speech change, focal weakness, seizures, loss of  consciousness, weakness and headaches.  Endo/Heme/Allergies: Negative for environmental allergies and polydipsia. Does not bruise/bleed easily.  Psychiatric/Behavioral: Negative for depression, suicidal ideas, hallucinations, memory loss and substance abuse. The patient is not nervous/anxious and does not have insomnia.        Objective:    Physical Exam  Vitals reviewed. Constitutional: She is oriented to person, place, and time. She appears well-developed and well-nourished.  HENT:  Head: Normocephalic and atraumatic.        Right Ear: External ear normal.  Left Ear: External ear normal.  Nose: Nose normal.  Mouth/Throat: Oropharynx is clear and moist.  Eyes: Conjunctivae and EOM are normal. Pupils are equal, round, and reactive to light. Right eye exhibits no discharge. Left eye exhibits no discharge. No scleral icterus.  Neck: Normal range of motion. Neck supple. No tracheal deviation present. No thyromegaly present.  Cardiovascular: Normal rate, regular rhythm, normal heart sounds and intact distal pulses.  Exam reveals no gallop and no friction rub.   No murmur heard. Respiratory: Effort normal and breath sounds normal. No respiratory distress. She has no wheezes. She has no rales. She exhibits no tenderness.  GI: Soft. Bowel sounds are normal. She exhibits no distension and no mass. There is no tenderness. There is no rebound and no guarding.  Genitourinary:  Breasts no masses skin changes or nipple changes bilaterally      Vulva is normal without lesions Vagina is pink moist without discharge Cervix normal in appearance and pap is done Uterus is normal size shape and contour Adnexa is negative with normal sized ovaries   Musculoskeletal: Normal range of motion. She exhibits no edema and no tenderness.  Neurological: She is alert and oriented to person, place, and time. She has normal reflexes. She displays normal reflexes. No cranial nerve deficit. She exhibits normal muscle  tone. Coordination normal.  Skin: Skin is warm and dry. No rash noted. No erythema. No pallor.  Psychiatric: She has a normal mood and affect. Her behavior is normal. Judgment and thought content normal.       Assessment:    Healthy female exam.    Plan:    Mammogram ordered. Follow up in: 1 year.

## 2013-11-20 ENCOUNTER — Other Ambulatory Visit (HOSPITAL_COMMUNITY): Payer: Self-pay | Admitting: Family Medicine

## 2013-11-21 ENCOUNTER — Other Ambulatory Visit (HOSPITAL_COMMUNITY): Payer: Self-pay | Admitting: Family Medicine

## 2013-11-21 DIAGNOSIS — M5412 Radiculopathy, cervical region: Secondary | ICD-10-CM

## 2013-11-24 ENCOUNTER — Ambulatory Visit (HOSPITAL_COMMUNITY)
Admission: RE | Admit: 2013-11-24 | Discharge: 2013-11-24 | Disposition: A | Discharge status: Home / Self Care | Payer: BC Managed Care – PPO | Source: Ambulatory Visit | Attending: Urology | Admitting: Urology

## 2013-11-24 ENCOUNTER — Other Ambulatory Visit: Payer: Self-pay | Admitting: Urology

## 2013-11-24 ENCOUNTER — Ambulatory Visit (HOSPITAL_COMMUNITY)
Admission: RE | Admit: 2013-11-24 | Discharge: 2013-11-24 | Disposition: A | Discharge status: Home / Self Care | Payer: BC Managed Care – PPO | Source: Ambulatory Visit | Attending: Family Medicine | Admitting: Family Medicine

## 2013-11-24 DIAGNOSIS — N200 Calculus of kidney: Secondary | ICD-10-CM | POA: Insufficient documentation

## 2013-11-24 DIAGNOSIS — M5020 Other cervical disc displacement, unspecified cervical region: Secondary | ICD-10-CM | POA: Insufficient documentation

## 2013-11-24 DIAGNOSIS — M5030 Other cervical disc degeneration, unspecified cervical region: Secondary | ICD-10-CM | POA: Insufficient documentation

## 2013-11-24 DIAGNOSIS — M542 Cervicalgia: Secondary | ICD-10-CM | POA: Insufficient documentation

## 2013-11-24 DIAGNOSIS — M4802 Spinal stenosis, cervical region: Secondary | ICD-10-CM | POA: Insufficient documentation

## 2013-11-24 DIAGNOSIS — R29898 Other symptoms and signs involving the musculoskeletal system: Secondary | ICD-10-CM | POA: Insufficient documentation

## 2013-11-24 DIAGNOSIS — M5412 Radiculopathy, cervical region: Secondary | ICD-10-CM

## 2013-11-24 DIAGNOSIS — M25519 Pain in unspecified shoulder: Secondary | ICD-10-CM | POA: Insufficient documentation

## 2013-12-01 ENCOUNTER — Ambulatory Visit (INDEPENDENT_AMBULATORY_CARE_PROVIDER_SITE_OTHER): Payer: BC Managed Care – PPO | Admitting: Urology

## 2013-12-01 DIAGNOSIS — Z87440 Personal history of urinary (tract) infections: Secondary | ICD-10-CM

## 2013-12-01 DIAGNOSIS — E876 Hypokalemia: Secondary | ICD-10-CM

## 2013-12-01 DIAGNOSIS — N200 Calculus of kidney: Secondary | ICD-10-CM

## 2013-12-04 ENCOUNTER — Other Ambulatory Visit (HOSPITAL_COMMUNITY): Payer: Self-pay | Admitting: Family Medicine

## 2013-12-04 DIAGNOSIS — Z1231 Encounter for screening mammogram for malignant neoplasm of breast: Secondary | ICD-10-CM

## 2013-12-12 ENCOUNTER — Ambulatory Visit (HOSPITAL_COMMUNITY)
Admission: RE | Admit: 2013-12-12 | Discharge: 2013-12-12 | Disposition: A | Discharge status: Home / Self Care | Payer: BC Managed Care – PPO | Source: Ambulatory Visit | Attending: Family Medicine | Admitting: Family Medicine

## 2013-12-12 DIAGNOSIS — Z1231 Encounter for screening mammogram for malignant neoplasm of breast: Secondary | ICD-10-CM | POA: Insufficient documentation

## 2014-02-26 HISTORY — PX: ANTERIOR CERVICAL DECOMP/DISCECTOMY FUSION: SHX1161

## 2014-05-07 ENCOUNTER — Other Ambulatory Visit (HOSPITAL_COMMUNITY): Payer: Self-pay | Admitting: Family Medicine

## 2014-05-07 DIAGNOSIS — G43909 Migraine, unspecified, not intractable, without status migrainosus: Secondary | ICD-10-CM

## 2014-05-11 ENCOUNTER — Ambulatory Visit (HOSPITAL_COMMUNITY): Payer: BC Managed Care – PPO

## 2014-05-14 ENCOUNTER — Encounter (HOSPITAL_COMMUNITY): Payer: Self-pay

## 2014-05-14 ENCOUNTER — Ambulatory Visit (HOSPITAL_COMMUNITY)
Admission: RE | Admit: 2014-05-14 | Discharge: 2014-05-14 | Disposition: A | Discharge status: Home / Self Care | Payer: BC Managed Care – PPO | Source: Ambulatory Visit | Attending: Family Medicine | Admitting: Family Medicine

## 2014-05-14 DIAGNOSIS — G43909 Migraine, unspecified, not intractable, without status migrainosus: Secondary | ICD-10-CM | POA: Diagnosis not present

## 2014-05-14 MED ORDER — IOHEXOL 300 MG/ML  SOLN
75.0000 mL | Freq: Once | INTRAMUSCULAR | Status: AC | PRN
  Administered 2014-05-14: 75 mL via INTRAVENOUS

## 2014-06-21 ENCOUNTER — Inpatient Hospital Stay (HOSPITAL_COMMUNITY)
Admission: EM | Admit: 2014-06-21 | Discharge: 2014-06-23 | DRG: 392 | Disposition: A | Discharge status: Home / Self Care | Payer: 59 | Attending: Family Medicine | Admitting: Family Medicine

## 2014-06-21 ENCOUNTER — Emergency Department (HOSPITAL_COMMUNITY): Payer: 59

## 2014-06-21 ENCOUNTER — Encounter (HOSPITAL_COMMUNITY): Payer: Self-pay | Admitting: *Deleted

## 2014-06-21 DIAGNOSIS — A084 Viral intestinal infection, unspecified: Principal | ICD-10-CM | POA: Diagnosis present

## 2014-06-21 DIAGNOSIS — K589 Irritable bowel syndrome without diarrhea: Secondary | ICD-10-CM | POA: Diagnosis present

## 2014-06-21 DIAGNOSIS — I10 Essential (primary) hypertension: Secondary | ICD-10-CM | POA: Diagnosis present

## 2014-06-21 DIAGNOSIS — Z87440 Personal history of urinary (tract) infections: Secondary | ICD-10-CM

## 2014-06-21 DIAGNOSIS — M4646 Discitis, unspecified, lumbar region: Secondary | ICD-10-CM | POA: Diagnosis present

## 2014-06-21 DIAGNOSIS — Z833 Family history of diabetes mellitus: Secondary | ICD-10-CM | POA: Diagnosis not present

## 2014-06-21 DIAGNOSIS — F419 Anxiety disorder, unspecified: Secondary | ICD-10-CM | POA: Diagnosis present

## 2014-06-21 DIAGNOSIS — Z800 Family history of malignant neoplasm of digestive organs: Secondary | ICD-10-CM | POA: Diagnosis not present

## 2014-06-21 DIAGNOSIS — M1990 Unspecified osteoarthritis, unspecified site: Secondary | ICD-10-CM | POA: Diagnosis present

## 2014-06-21 DIAGNOSIS — D649 Anemia, unspecified: Secondary | ICD-10-CM | POA: Diagnosis present

## 2014-06-21 DIAGNOSIS — G43909 Migraine, unspecified, not intractable, without status migrainosus: Secondary | ICD-10-CM | POA: Diagnosis present

## 2014-06-21 DIAGNOSIS — E876 Hypokalemia: Secondary | ICD-10-CM | POA: Diagnosis present

## 2014-06-21 DIAGNOSIS — Z825 Family history of asthma and other chronic lower respiratory diseases: Secondary | ICD-10-CM

## 2014-06-21 DIAGNOSIS — Z87442 Personal history of urinary calculi: Secondary | ICD-10-CM

## 2014-06-21 DIAGNOSIS — K219 Gastro-esophageal reflux disease without esophagitis: Secondary | ICD-10-CM | POA: Diagnosis present

## 2014-06-21 DIAGNOSIS — R197 Diarrhea, unspecified: Secondary | ICD-10-CM | POA: Diagnosis present

## 2014-06-21 DIAGNOSIS — G8929 Other chronic pain: Secondary | ICD-10-CM | POA: Diagnosis present

## 2014-06-21 DIAGNOSIS — R740 Nonspecific elevation of levels of transaminase and lactic acid dehydrogenase [LDH]: Secondary | ICD-10-CM | POA: Diagnosis present

## 2014-06-21 DIAGNOSIS — E860 Dehydration: Secondary | ICD-10-CM | POA: Diagnosis present

## 2014-06-21 DIAGNOSIS — F319 Bipolar disorder, unspecified: Secondary | ICD-10-CM | POA: Diagnosis present

## 2014-06-21 DIAGNOSIS — Z79891 Long term (current) use of opiate analgesic: Secondary | ICD-10-CM | POA: Diagnosis not present

## 2014-06-21 DIAGNOSIS — K529 Noninfective gastroenteritis and colitis, unspecified: Secondary | ICD-10-CM

## 2014-06-21 DIAGNOSIS — Z79899 Other long term (current) drug therapy: Secondary | ICD-10-CM

## 2014-06-21 DIAGNOSIS — R109 Unspecified abdominal pain: Secondary | ICD-10-CM

## 2014-06-21 LAB — CBC WITH DIFFERENTIAL/PLATELET
Basophils Absolute: 0 10*3/uL (ref 0.0–0.1)
Basophils Relative: 0 % (ref 0–1)
Eosinophils Absolute: 0.1 10*3/uL (ref 0.0–0.7)
Eosinophils Relative: 1 % (ref 0–5)
HCT: 37 % (ref 36.0–46.0)
HEMOGLOBIN: 11.9 g/dL — AB (ref 12.0–15.0)
LYMPHS ABS: 1.4 10*3/uL (ref 0.7–4.0)
LYMPHS PCT: 15 % (ref 12–46)
MCH: 28.5 pg (ref 26.0–34.0)
MCHC: 32.2 g/dL (ref 30.0–36.0)
MCV: 88.7 fL (ref 78.0–100.0)
MONOS PCT: 10 % (ref 3–12)
Monocytes Absolute: 0.9 10*3/uL (ref 0.1–1.0)
NEUTROS ABS: 7.3 10*3/uL (ref 1.7–7.7)
NEUTROS PCT: 74 % (ref 43–77)
PLATELETS: 280 10*3/uL (ref 150–400)
RBC: 4.17 MIL/uL (ref 3.87–5.11)
RDW: 13.9 % (ref 11.5–15.5)
WBC: 9.7 10*3/uL (ref 4.0–10.5)

## 2014-06-21 LAB — COMPREHENSIVE METABOLIC PANEL
ALBUMIN: 3.8 g/dL (ref 3.5–5.2)
ALT: 62 U/L — ABNORMAL HIGH (ref 0–35)
AST: 39 U/L — ABNORMAL HIGH (ref 0–37)
Alkaline Phosphatase: 67 U/L (ref 39–117)
Anion gap: 6 (ref 5–15)
BUN: 6 mg/dL (ref 6–23)
CHLORIDE: 105 meq/L (ref 96–112)
CO2: 30 mmol/L (ref 19–32)
CREATININE: 0.78 mg/dL (ref 0.50–1.10)
Calcium: 8.6 mg/dL (ref 8.4–10.5)
GFR calc Af Amer: >90 mL/min (ref >90)
GFR calc non Af Amer: >90 mL/min (ref >90)
GLUCOSE: 103 mg/dL — AB (ref 70–99)
Potassium: 2.6 mmol/L — CL (ref 3.5–5.1)
Sodium: 141 mmol/L (ref 135–145)
Total Bilirubin: 0.8 mg/dL (ref 0.3–1.2)
Total Protein: 6.4 g/dL (ref 6.0–8.3)

## 2014-06-21 LAB — LIPASE, BLOOD: Lipase: 20 U/L (ref 11–59)

## 2014-06-21 LAB — CLOSTRIDIUM DIFFICILE BY PCR: Toxigenic C. Difficile by PCR: NEGATIVE

## 2014-06-21 MED ORDER — SUMATRIPTAN SUCCINATE 50 MG PO TABS
50.0000 mg | ORAL_TABLET | ORAL | Status: DC | PRN
  Filled 2014-06-21: qty 1

## 2014-06-21 MED ORDER — POTASSIUM CHLORIDE 10 MEQ/100ML IV SOLN
10.0000 meq | Freq: Once | INTRAVENOUS | Status: AC
  Administered 2014-06-21: 10 meq via INTRAVENOUS
  Filled 2014-06-21: qty 100

## 2014-06-21 MED ORDER — SODIUM CHLORIDE 0.9 % IJ SOLN
3.0000 mL | Freq: Two times a day (BID) | INTRAMUSCULAR | Status: DC
  Administered 2014-06-22 – 2014-06-23 (×2): 3 mL via INTRAVENOUS

## 2014-06-21 MED ORDER — SODIUM CHLORIDE 0.9 % IV BOLUS (SEPSIS)
1000.0000 mL | Freq: Once | INTRAVENOUS | Status: AC
  Administered 2014-06-21: 1000 mL via INTRAVENOUS

## 2014-06-21 MED ORDER — POTASSIUM CHLORIDE IN NACL 40-0.9 MEQ/L-% IV SOLN
INTRAVENOUS | Status: DC
  Administered 2014-06-21 – 2014-06-22 (×2): 100 mL/h via INTRAVENOUS
  Administered 2014-06-23: 50 mL/h via INTRAVENOUS
  Filled 2014-06-21 (×5): qty 1000

## 2014-06-21 MED ORDER — PROPRANOLOL HCL 20 MG PO TABS
20.0000 mg | ORAL_TABLET | Freq: Two times a day (BID) | ORAL | Status: DC
  Administered 2014-06-22 – 2014-06-23 (×3): 20 mg via ORAL
  Filled 2014-06-21 (×3): qty 1

## 2014-06-21 MED ORDER — HEPARIN SODIUM (PORCINE) 5000 UNIT/ML IJ SOLN
5000.0000 [IU] | Freq: Three times a day (TID) | INTRAMUSCULAR | Status: DC
  Administered 2014-06-21 – 2014-06-23 (×5): 5000 [IU] via SUBCUTANEOUS
  Filled 2014-06-21 (×5): qty 1

## 2014-06-21 MED ORDER — HYDROCHLOROTHIAZIDE 25 MG PO TABS
25.0000 mg | ORAL_TABLET | Freq: Every day | ORAL | Status: DC
  Filled 2014-06-21 (×2): qty 1

## 2014-06-21 MED ORDER — LORATADINE 10 MG PO TABS
10.0000 mg | ORAL_TABLET | Freq: Every day | ORAL | Status: DC
  Administered 2014-06-23: 10 mg via ORAL
  Filled 2014-06-21 (×2): qty 1

## 2014-06-21 MED ORDER — MORPHINE SULFATE 2 MG/ML IJ SOLN
2.0000 mg | Freq: Once | INTRAMUSCULAR | Status: AC
  Administered 2014-06-21: 2 mg via INTRAVENOUS
  Filled 2014-06-21: qty 1

## 2014-06-21 MED ORDER — VILAZODONE HCL 40 MG PO TABS
40.0000 mg | ORAL_TABLET | Freq: Every day | ORAL | Status: DC
  Administered 2014-06-22 – 2014-06-23 (×2): 40 mg via ORAL
  Filled 2014-06-21 (×5): qty 1

## 2014-06-21 MED ORDER — HYDROCODONE-ACETAMINOPHEN 10-325 MG PO TABS: 1.0000 | ORAL_TABLET | ORAL | Status: DC | PRN

## 2014-06-21 MED ORDER — IOHEXOL 300 MG/ML  SOLN
100.0000 mL | Freq: Once | INTRAMUSCULAR | Status: AC | PRN
  Administered 2014-06-21: 100 mL via INTRAVENOUS

## 2014-06-21 MED ORDER — ALPRAZOLAM 0.5 MG PO TABS
0.5000 mg | ORAL_TABLET | Freq: Three times a day (TID) | ORAL | Status: DC | PRN
  Administered 2014-06-21 – 2014-06-23 (×3): 0.5 mg via ORAL
  Filled 2014-06-21 (×3): qty 1

## 2014-06-21 MED ORDER — SODIUM CHLORIDE 0.9 % IV SOLN
INTRAVENOUS | Status: AC
  Administered 2014-06-21: 19:00:00 via INTRAVENOUS

## 2014-06-21 MED ORDER — HYDROCODONE-ACETAMINOPHEN 10-325 MG PO TABS
1.0000 | ORAL_TABLET | ORAL | Status: DC | PRN
  Administered 2014-06-21: 1 via ORAL
  Filled 2014-06-21: qty 1

## 2014-06-21 MED ORDER — ONDANSETRON HCL 4 MG/2ML IJ SOLN
4.0000 mg | Freq: Once | INTRAMUSCULAR | Status: AC
  Administered 2014-06-21: 4 mg via INTRAVENOUS
  Filled 2014-06-21: qty 2

## 2014-06-21 MED ORDER — IOHEXOL 300 MG/ML  SOLN: 50.0000 mL | Freq: Once | INTRAMUSCULAR | Status: AC | PRN

## 2014-06-21 MED ORDER — HYDROMORPHONE HCL 1 MG/ML IJ SOLN: 1.0000 mg | Freq: Once | INTRAMUSCULAR | Status: DC

## 2014-06-21 MED ORDER — ONDANSETRON HCL 4 MG PO TABS: 4.0000 mg | ORAL_TABLET | Freq: Four times a day (QID) | ORAL | Status: DC | PRN

## 2014-06-21 MED ORDER — CYCLOBENZAPRINE HCL 10 MG PO TABS: 10.0000 mg | ORAL_TABLET | Freq: Three times a day (TID) | ORAL | Status: DC | PRN

## 2014-06-21 MED ORDER — SODIUM CHLORIDE 0.9 % IV SOLN
INTRAVENOUS | Status: DC
  Administered 2014-06-21: 14:00:00 via INTRAVENOUS

## 2014-06-21 MED ORDER — ONDANSETRON HCL 4 MG/2ML IJ SOLN: 4.0000 mg | Freq: Four times a day (QID) | INTRAMUSCULAR | Status: DC | PRN

## 2014-06-21 NOTE — H&P (Signed)
[facility] History and Physical  Bethany King ONG:[id number] DOB: 04/10/1971 DOA: 06/21/2014  Referring physician: ER PCP: Trinna Post, MD   Chief Complaint: Diarrhea  HPI: Bethany King is a 44 y.o. female  This is a 44 year old lady who gives a 3 to four-day history of myalgias, viral type of illness and now a 24-hour history of profuse diarrhea. Yesterday she apparently had diarrhea 30 times. She did not have any rectal bleeding with it but has had significant abdominal cramping. She has had couple of episodes of vomiting but none in the last few hours. She feels very thirsty and somewhat lightheaded. Evaluation in the emergency room showed it to be hypokalemic with a potassium of 2.6. She tells me that approximately 3 years ago she was diagnosed with  ulcerative colitis on colonoscopy by Dr. Darrick Penna.She is now being admitted for further management.   Review of Systems:  Apart from symptoms above, all other systems are negative.  Past Medical History  Diagnosis Date  . GERD (gastroesophageal reflux disease)   . Irritable bowel syndrome (IBS)   . HTN (hypertension)   . Anxiety   . Depression   . Multiple hemangiomas 2006    hepatic, stable  . Costochondritis   . S/P endoscopy April 2012    mild gastritis, benign gastric polyp  . S/P colonoscopy April 2012    internal hemorrhoids, simple adenoma  . Iron deficiency anemia   . Hepatic hemangioma   . Cervical disc herniation   . Nephrolithiasis 2006    w/ hydronephrosis  . Recurrent UTI   . Medullary sponge kidney     left  . Complication of anesthesia   . PONV (postoperative nausea and vomiting)   . Pain     left leg  . Arthritis    Past Surgical History  Procedure Laterality Date  . Carpel tunnel release  03/2010 and 05/2010    bilateral  . Kidney stone surgery      numerou-s esl  . Foot surgery Bilateral     right- plantar fasciotomy  . Esophagogastroduodenoscopy  10/14/10  . Dilitation &  currettage/hystroscopy with thermachoice ablation N/A 09/07/2012    Procedure: DILATATION & CURETTAGE/HYSTEROSCOPY WITH THERMACHOICE ABLATION;  Surgeon: Lazaro Arms, MD;  Location: [facility];  Service: Gynecology;  Laterality: N/A;  . Endometrial ablation  2014  . Cholecystectomy N/A 07/14/2013    Procedure: LAPAROSCOPIC CHOLECYSTECTOMY;  Surgeon: Dalia Heading, MD;  Location: [facility];  Service: General;  Laterality: N/A;   Social History:  reports that she has never smoked. She has never used smokeless tobacco. She reports that she does not drink alcohol or use illicit drugs.  Allergies  Allergen Reactions  . Hydromorphone Itching  . Prednisone     Increase anxiety symptoms, insomnia  . Tramadol Hcl Other (See Comments)    dizziness  . Sulfonamide Derivatives Rash    Family History  Problem Relation Age of Onset  . Irritable bowel syndrome Mother   . Scleroderma Mother   . Rheum arthritis Mother   . Asthma Mother   . Colon cancer Cousin 58    second cousin Nettie Elm [place])  . Melanoma Father   . Cancer Father     Melanoma  . Cancer Maternal Grandmother     Ovarian  . Diabetes Maternal Grandmother   . Thyroid disease Maternal Aunt      Prior to Admission medications   Medication Sig Start Date End Date Taking? Authorizing Provider  ALPRAZolam (  XANAX) 0.5 MG tablet Take 0.5 mg by mouth 3 (three) times daily as needed for sleep or anxiety.    Yes Historical Provider, MD  cyclobenzaprine (FLEXERIL) 10 MG tablet Take 10 mg by mouth 3 (three) times daily as needed for muscle spasms.   Yes Historical Provider, MD  hydrochlorothiazide (HYDRODIURIL) 25 MG tablet Take 25 mg by mouth daily.   Yes Historical Provider, MD  HYDROcodone-acetaminophen (NORCO) 10-325 MG per tablet Take 1-2 tablets by mouth every 4 (four) hours as needed for moderate pain. 07/14/13  Yes Dalia Heading, MD  loperamide (IMODIUM) 2 MG capsule Take 2 mg by mouth as needed for diarrhea or loose stools.   Yes  Historical Provider, MD  loratadine (CLARITIN) 10 MG tablet Take 10 mg by mouth daily.   Yes Historical Provider, MD  ondansetron (ZOFRAN) 4 MG tablet Take 4 mg by mouth every 6 (six) hours as needed for nausea. 02/02/13  Yes Rolland Porter, MD  potassium chloride (K-DUR) 10 MEQ tablet Take 1 tablet (10 mEq total) by mouth daily. Patient taking differently: Take 40 mEq by mouth daily.  09/22/12  Yes Salley Scarlet, MD  Probiotic Product (HEALTHY COLON) CAPS Take 1 capsule by mouth daily.     Yes Historical Provider, MD  propranolol (INDERAL) 20 MG tablet Take 1 tablet by mouth 2 (two) times daily. 05/30/14  Yes Historical Provider, MD  SUMAtriptan (IMITREX) 50 MG tablet Take 50 mg by mouth as needed for migraine. May repeat in 2 hours if headache persists or recurs.   Yes Historical Provider, MD  Vilazodone HCl (VIIBRYD) 40 MG TABS Take 40 mg by mouth daily.   Yes Historical Provider, MD   Physical Exam: Filed Vitals:   06/21/14 1301 06/21/14 1519 06/21/14 1643  BP: 126/72 129/84 139/80  Pulse: 79 92 71  Temp: 98.8 F (37.1 C)    TempSrc: Oral    Resp: 18 18 12   Height: 5\' 3"  (1.6 m)    Weight: 83.915 kg (185 lb)    SpO2:  100% 100%    Wt Readings from Last 3 Encounters:  06/21/14 83.915 kg (185 lb)  10/16/13 83.462 kg (184 lb)  07/10/13 85.095 kg (187 lb 9.6 oz)    General:  Appears uncomfortable at rest with some pain in her abdomen. She is clinically dehydrated. She remains alert and orientated and is not toxic. Eyes: PERRL, normal lids, irises & conjunctiva ENT: grossly normal hearing, lips & tongue Neck: no LAD, masses or thyromegaly Cardiovascular: RRR, no m/r/g. No LE edema. Telemetry: SR, no arrhythmias  Respiratory: CTA bilaterally, no w/r/r. Normal respiratory effort. Abdomen: soft, ntnd Skin: no rash or induration seen on limited exam Musculoskeletal: grossly normal tone BUE/BLE Psychiatric: grossly normal mood and affect, speech fluent and appropriate Neurologic:  grossly non-focal.          Labs on Admission:  Basic Metabolic Panel:  Recent Labs Lab 06/21/14 1413  NA 141  K 2.6*  CL 105  CO2 30  GLUCOSE 103*  BUN 6  CREATININE 0.78  CALCIUM 8.6   Liver Function Tests:  Recent Labs Lab 06/21/14 1413  AST 39*  ALT 62*  ALKPHOS 67  BILITOT 0.8  PROT 6.4  ALBUMIN 3.8    Recent Labs Lab 06/21/14 1413  LIPASE 20   No results for input(s): AMMONIA in the last 168 hours. CBC:  Recent Labs Lab 06/21/14 1413  WBC 9.7  NEUTROABS 7.3  HGB 11.9*  HCT 37.0  MCV  88.7  PLT 280   Cardiac Enzymes: No results for input(s): CKTOTAL, CKMB, CKMBINDEX, TROPONINI in the last 168 hours.  BNP (last 3 results) No results for input(s): PROBNP in the last 8760 hours. CBG: No results for input(s): GLUCAP in the last 168 hours.  Radiological Exams on Admission: Dg Chest 2 View  06/21/2014   CLINICAL DATA:  Severe abdomen pain since Monday.  EXAM: CHEST  2 VIEW  COMPARISON:  February 21, 2014  FINDINGS: The heart size and mediastinal contours are stable. There is no focal infiltrate, pulmonary edema, or pleural effusion. The visualized skeletal structures are unremarkable.  IMPRESSION: No active cardiopulmonary disease.   Electronically Signed   By: Sherian Rein M.D.   On: 06/21/2014 15:38   Ct Abdomen Pelvis W Contrast  06/21/2014   CLINICAL DATA:  Generalized abdominal pain for 1 day, increased bowel movement  EXAM: CT ABDOMEN AND PELVIS WITH CONTRAST  TECHNIQUE: Multidetector CT imaging of the abdomen and pelvis was performed using the standard protocol following bolus administration of intravenous contrast.  CONTRAST:  OMNIPAQUE IOHEXOL 300 MG/ML  SOLN  COMPARISON:  11/12/2010 and 02/02/2013  FINDINGS: Sagittal images of the spine shows mild degenerative changes lower thoracic spine.  The lung bases are unremarkable. Enhanced liver shows no biliary ductal dilatation. The patient is status postcholecystectomy. Axial image 17 a  hemangioma posterior aspect of the right hepatic lobe measures 2.6 cm stable in size in appearance from 5/ 30/12. A 8 mm cyst or hemangioma in right hepatic lobe anteriorly is stable in size from prior exam. Stable cyst or hemangioma in lateral segment of left hepatic lobe measures 1.4 cm.  The pancreas, spleen and adrenal glands are unremarkable.  There is mild cortical thinning in lower pole of the left kidney posterior aspect. Bilateral kidneys show symmetrical enhancement. No hydronephrosis or hydroureter. At least 4 or 5 nonobstructive calcified calculi are noted within right kidney the largest in lower pole measures 5.8 mm. At least 4 calcified nonobstructive calculi are noted within left kidney the largest in midpole measures 6 mm.  Delayed renal images shows bilateral renal symmetrical excretion. Bilateral visualized proximal ureter is unremarkable. There is a cyst in midpole medial aspect of the right kidney measures 1.2 cm.  No small bowel obstruction. No ascites or free air. No adenopathy. There is no pericecal inflammation. Normal retrocecal appendix noted axial image 53.  The uterus is unremarkable. There is a cyst/follicle within right ovary posterior aspect measures 2 cm. The left ovary is unremarkable. No pelvic free fluid is noted. The urinary bladder is unremarkable. No evidence of colitis or diverticulitis. Few sigmoid colon diverticula are noted.  IMPRESSION: 1. No acute inflammatory process within abdomen or pelvis. 2. Stable hemangioma in right hepatic lobe posteriorly. Stable cyst or hemangioma in right hepatic lobe anteriorly and lateral segment of left hepatic lobe. 3. Again noted bilateral nonobstructive nephrolithiasis. No hydronephrosis or hydroureter. 4. No calcified ureteral calculi. Bilateral renal symmetrical excretion. 5. Normal retrocecal appendix.  No pericecal inflammation. 6. No small bowel obstruction. 7. Few diverticula are noted proximal sigmoid colon. No evidence of acute  diverticulitis. 8. There is a cyst/follicle within right ovary measures 2 cm. Unremarkable uterus and left ovary.   Electronically Signed   By: Natasha Mead M.D.   On: 06/21/2014 15:16    EKG: Independently reviewed. Normal sinus rhythm without any acute T-wave changes  Assessment/Plan   1. Diarrhea, profuse. This is probably a viral illness, gastroenteritis which will resolve.  CT scan of the abdomen does not show any inflammation in her bowel. She is clinically dehydrated and hypokalemic. She will need IV fluid replacement with potassium. She apparently has a history of ulcerative colitis and has not seen a gastroenterologist for several years. We will ask gastroenterology to see her in consultation. 2. Hypokalemia. We will replace this intravenously.  Further recommendations will depend on patient's hospital progress.   Code Status: Full code.   DVT Prophylaxis: Heparin.  Family Communication: I discussed the plan with the patient at the bedside.   Disposition Plan: Home when medically stable.   Time spent: 45 minutes.  Wilson Singer [facility] Pager [phone].

## 2014-06-21 NOTE — ED Notes (Signed)
Called floor for report, nurse unavailable, will call back.

## 2014-06-21 NOTE — ED Notes (Signed)
Pt with abd pain and diarrhea since Monday, seen family PCP yesterday, pain became more severe after doctor's visit and increase in diarrhea since as well

## 2014-06-21 NOTE — ED Provider Notes (Signed)
CSN: [id number]     Arrival date & time 06/21/14  1236 History  This chart was scribed for Fredia Sorrow, MD by Edison Simon, ED Scribe. This patient was seen in room APA04/APA04 and the patient's care was started at 1:45 PM.    Chief Complaint  Patient presents with  . Abdominal Pain   Patient is a 44 y.o. female presenting with abdominal pain. The history is provided by the patient. No language interpreter was used.  Abdominal Pain Pain location:  Generalized Pain quality: cramping   Pain radiates to:  Does not radiate Pain severity:  Moderate Onset quality:  Sudden Timing:  Constant Progression:  Improving Chronicity:  New Context: awakening from sleep and recent illness   Context: not retching, not sick contacts and not suspicious food intake   Relieved by: Hydrocodone. Worsened by:  Nothing tried Ineffective treatments:  None tried Associated symptoms: chest pain, chills, cough, diarrhea, fever (3 days ago, resolved after Tylenol), nausea, shortness of breath and vomiting   Associated symptoms: no dysuria and no sore throat   Diarrhea:    Diarrhea characteristics: not bloody.   Number of occurrences:  35   Severity:  Severe   Duration:  3 days   Timing:  Intermittent   Progression:  Improving   HPI Comments: Bethany King is a 44 y.o. female with history of GERD, IBS, and ulcerative colitis who presents to the Emergency Department complaining of diffuse abdominal pain and diarrhea with onset 3 days ago. She states diarrhea was mild the first day with approximately 5 counts, then worsened yesterday to 30 counts, and she states she has had fewer today but notes she has not been eating. She denies blood in her stool. She states she saw her PCP yesterday for chest pain, cough, and SOB and diagnosed with viral infection and prescribed Prednisone, which she did not receive from the pharmacist. She called her PCP again this morning for abdominal cramping and was referred here; she  states cramping improved after taking hydrocodone and rates it at 6/10 now. She reports associated nausea and vomiting 3 days ago only. She denies sick contacts. She also notes recurrent insect bites to her abdomen for the past 3 months; she notes she has a cat with fleas.  PCP: Rocky Morel, MD with [facility]   Past Medical History  Diagnosis Date  . GERD (gastroesophageal reflux disease)   . Irritable bowel syndrome (IBS)   . HTN (hypertension)   . Anxiety   . Depression   . Multiple hemangiomas 2006    hepatic, stable  . Costochondritis   . S/P endoscopy April 2012    mild gastritis, benign gastric polyp  . S/P colonoscopy April 2012    internal hemorrhoids, simple adenoma  . Iron deficiency anemia   . Hepatic hemangioma   . Cervical disc herniation   . Nephrolithiasis 2006    w/ hydronephrosis  . Recurrent UTI   . Medullary sponge kidney     left  . Complication of anesthesia   . PONV (postoperative nausea and vomiting)   . Pain     left leg  . Arthritis    Past Surgical History  Procedure Laterality Date  . Carpel tunnel release  03/2010 and 05/2010    bilateral  . Kidney stone surgery      numerou-s esl  . Foot surgery Bilateral     right- plantar fasciotomy  . Esophagogastroduodenoscopy  10/14/10  . Dilitation & currettage/hystroscopy with  thermachoice ablation N/A 09/07/2012    Procedure: DILATATION & CURETTAGE/HYSTEROSCOPY WITH THERMACHOICE ABLATION;  Surgeon: Florian Buff, MD;  Location: [facility];  Service: Gynecology;  Laterality: N/A;  . Endometrial ablation  2014  . Cholecystectomy N/A 07/14/2013    Procedure: LAPAROSCOPIC CHOLECYSTECTOMY;  Surgeon: Jamesetta So, MD;  Location: [facility];  Service: General;  Laterality: N/A;   Family History  Problem Relation Age of Onset  . Irritable bowel syndrome Mother   . Scleroderma Mother   . Rheum arthritis Mother   . Asthma Mother   . Colon cancer Cousin 42    second cousin Sunday Spillers [place])  .  Melanoma Father   . Cancer Father     Melanoma  . Cancer Maternal Grandmother     Ovarian  . Diabetes Maternal Grandmother   . Thyroid disease Maternal Aunt    History  Substance Use Topics  . Smoking status: Never Smoker   . Smokeless tobacco: Never Used  . Alcohol Use: No   OB History    Gravida Para Term Preterm AB TAB SAB Ectopic Multiple Living   0              Review of Systems  Constitutional: Positive for fever (3 days ago, resolved after Tylenol), chills and appetite change.  HENT: Negative for rhinorrhea and sore throat.   Eyes: Negative for visual disturbance.  Respiratory: Positive for cough and shortness of breath.   Cardiovascular: Positive for chest pain. Negative for leg swelling.  Gastrointestinal: Positive for nausea, vomiting, abdominal pain and diarrhea. Negative for blood in stool.  Genitourinary: Negative for dysuria.  Musculoskeletal: Negative for back pain.       Positive body aches  Skin: Negative for rash.       Positive insect bites  Neurological: Positive for headaches.  Hematological: Does not bruise/bleed easily.  Psychiatric/Behavioral: Negative for confusion.      Allergies  Hydromorphone; Prednisone; Tramadol hcl; and Sulfonamide derivatives  Home Medications   Prior to Admission medications   Medication Sig Start Date End Date Taking? Authorizing Provider  ALPRAZolam Duanne Moron) 0.5 MG tablet Take 0.5 mg by mouth 3 (three) times daily as needed for sleep or anxiety.    Yes Historical Provider, MD  cyclobenzaprine (FLEXERIL) 10 MG tablet Take 10 mg by mouth 3 (three) times daily as needed for muscle spasms.   Yes Historical Provider, MD  hydrochlorothiazide (HYDRODIURIL) 25 MG tablet Take 25 mg by mouth daily.   Yes Historical Provider, MD  HYDROcodone-acetaminophen (NORCO) 10-325 MG per tablet Take 1-2 tablets by mouth every 4 (four) hours as needed for moderate pain. 07/14/13  Yes Jamesetta So, MD  loperamide (IMODIUM) 2 MG capsule Take  2 mg by mouth as needed for diarrhea or loose stools.   Yes Historical Provider, MD  loratadine (CLARITIN) 10 MG tablet Take 10 mg by mouth daily.   Yes Historical Provider, MD  ondansetron (ZOFRAN) 4 MG tablet Take 4 mg by mouth every 6 (six) hours as needed for nausea. 02/02/13  Yes Tanna Furry, MD  potassium chloride (K-DUR) 10 MEQ tablet Take 1 tablet (10 mEq total) by mouth daily. Patient taking differently: Take 40 mEq by mouth daily.  09/22/12  Yes Alycia Rossetti, MD  Probiotic Product (HEALTHY COLON) CAPS Take 1 capsule by mouth daily.     Yes Historical Provider, MD  propranolol (INDERAL) 20 MG tablet Take 1 tablet by mouth 2 (two) times daily. 05/30/14  Yes Historical Provider, MD  SUMAtriptan (IMITREX) 50 MG tablet Take 50 mg by mouth as needed for migraine. May repeat in 2 hours if headache persists or recurs.   Yes Historical Provider, MD  Vilazodone HCl (VIIBRYD) 40 MG TABS Take 40 mg by mouth daily.   Yes Historical Provider, MD   BP 129/84 mmHg  Pulse 92  Temp(Src) 98.8 F (37.1 C) (Oral)  Resp 18  Ht 5\' 3"  (1.6 m)  Wt 185 lb (83.915 kg)  BMI 32.78 kg/m2  SpO2 100% Physical Exam  Constitutional: She is oriented to person, place, and time. She appears well-developed and well-nourished.  HENT:  Head: Normocephalic and atraumatic.  Mucous membranes dry  Eyes: Conjunctivae and EOM are normal. Pupils are equal, round, and reactive to light.  Sclera clear  Neck: Normal range of motion. Neck supple.  Cardiovascular: Normal rate, regular rhythm and normal heart sounds.   No murmur heard. Pulmonary/Chest: Effort normal and breath sounds normal. No respiratory distress. She has no wheezes. She has no rales.  Abdominal: Soft. Bowel sounds are normal. She exhibits no distension. There is no tenderness.  Musculoskeletal: Normal range of motion. She exhibits no edema.  No swelling in ankles  Neurological: She is alert and oriented to person, place, and time. No cranial nerve  deficit. She exhibits normal muscle tone. Coordination normal.  Skin: Skin is warm and dry.  Psychiatric: She has a normal mood and affect.  Nursing note and vitals reviewed.   ED Course  Procedures (including critical care time)  COORDINATION OF CARE: 1:53 PM Discussed treatment plan with patient at beside, including CT of her abdomen. The patient agrees with the plan and has no further questions at this time.   Labs Review Labs Reviewed  COMPREHENSIVE METABOLIC PANEL - Abnormal; Notable for the following:    Potassium 2.6 (*)    Glucose, Bld 103 (*)    AST 39 (*)    ALT 62 (*)    All other components within normal limits  CBC WITH DIFFERENTIAL - Abnormal; Notable for the following:    Hemoglobin 11.9 (*)    All other components within normal limits  CLOSTRIDIUM DIFFICILE BY PCR  LIPASE, BLOOD   Results for orders placed or performed during the hospital encounter of 06/21/14  Clostridium Difficile by PCR  Result Value Ref Range   C difficile by pcr NEGATIVE NEGATIVE  Comprehensive metabolic panel  Result Value Ref Range   Sodium 141 135 - 145 mmol/L   Potassium 2.6 (LL) 3.5 - 5.1 mmol/L   Chloride 105 96 - 112 mEq/L   CO2 30 19 - 32 mmol/L   Glucose, Bld 103 (H) 70 - 99 mg/dL   BUN 6 6 - 23 mg/dL   Creatinine, Ser 0.78 0.50 - 1.10 mg/dL   Calcium 8.6 8.4 - 10.5 mg/dL   Total Protein 6.4 6.0 - 8.3 g/dL   Albumin 3.8 3.5 - 5.2 g/dL   AST 39 (H) 0 - 37 U/L   ALT 62 (H) 0 - 35 U/L   Alkaline Phosphatase 67 39 - 117 U/L   Total Bilirubin 0.8 0.3 - 1.2 mg/dL   GFR calc non Af Amer >90 >90 mL/min   GFR calc Af Amer >90 >90 mL/min   Anion gap 6 5 - 15  Lipase, blood  Result Value Ref Range   Lipase 20 11 - 59 U/L  CBC with Differential  Result Value Ref Range   WBC 9.7 4.0 - 10.5 K/uL   RBC 4.17  3.87 - 5.11 MIL/uL   Hemoglobin 11.9 (L) 12.0 - 15.0 g/dL   HCT 37.0 36.0 - 46.0 %   MCV 88.7 78.0 - 100.0 fL   MCH 28.5 26.0 - 34.0 pg   MCHC 32.2 30.0 - 36.0 g/dL    RDW 13.9 11.5 - 15.5 %   Platelets 280 150 - 400 K/uL   Neutrophils Relative % 74 43 - 77 %   Neutro Abs 7.3 1.7 - 7.7 K/uL   Lymphocytes Relative 15 12 - 46 %   Lymphs Abs 1.4 0.7 - 4.0 K/uL   Monocytes Relative 10 3 - 12 %   Monocytes Absolute 0.9 0.1 - 1.0 K/uL   Eosinophils Relative 1 0 - 5 %   Eosinophils Absolute 0.1 0.0 - 0.7 K/uL   Basophils Relative 0 0 - 1 %   Basophils Absolute 0.0 0.0 - 0.1 K/uL     Imaging Review Dg Chest 2 View  06/21/2014   CLINICAL DATA:  Severe abdomen pain since Monday.  EXAM: CHEST  2 VIEW  COMPARISON:  February 21, 2014  FINDINGS: The heart size and mediastinal contours are stable. There is no focal infiltrate, pulmonary edema, or pleural effusion. The visualized skeletal structures are unremarkable.  IMPRESSION: No active cardiopulmonary disease.   Electronically Signed   By: Abelardo Diesel M.D.   On: 06/21/2014 15:38   Ct Abdomen Pelvis W Contrast  06/21/2014   CLINICAL DATA:  Generalized abdominal pain for 1 day, increased bowel movement  EXAM: CT ABDOMEN AND PELVIS WITH CONTRAST  TECHNIQUE: Multidetector CT imaging of the abdomen and pelvis was performed using the standard protocol following bolus administration of intravenous contrast.  CONTRAST:  136mL OMNIPAQUE IOHEXOL 300 MG/ML  SOLN  COMPARISON:  11/12/2010 and 02/02/2013  FINDINGS: Sagittal images of the spine shows mild degenerative changes lower thoracic spine.  The lung bases are unremarkable. Enhanced liver shows no biliary ductal dilatation. The patient is status postcholecystectomy. Axial image 17 a hemangioma posterior aspect of the right hepatic lobe measures 2.6 cm stable in size in appearance from 5/ 30/12. A 8 mm cyst or hemangioma in right hepatic lobe anteriorly is stable in size from prior exam. Stable cyst or hemangioma in lateral segment of left hepatic lobe measures 1.4 cm.  The pancreas, spleen and adrenal glands are unremarkable.  There is mild cortical thinning in lower pole of the  left kidney posterior aspect. Bilateral kidneys show symmetrical enhancement. No hydronephrosis or hydroureter. At least 4 or 5 nonobstructive calcified calculi are noted within right kidney the largest in lower pole measures 5.8 mm. At least 4 calcified nonobstructive calculi are noted within left kidney the largest in midpole measures 6 mm.  Delayed renal images shows bilateral renal symmetrical excretion. Bilateral visualized proximal ureter is unremarkable. There is a cyst in midpole medial aspect of the right kidney measures 1.2 cm.  No small bowel obstruction. No ascites or free air. No adenopathy. There is no pericecal inflammation. Normal retrocecal appendix noted axial image 53.  The uterus is unremarkable. There is a cyst/follicle within right ovary posterior aspect measures 2 cm. The left ovary is unremarkable. No pelvic free fluid is noted. The urinary bladder is unremarkable. No evidence of colitis or diverticulitis. Few sigmoid colon diverticula are noted.  IMPRESSION: 1. No acute inflammatory process within abdomen or pelvis. 2. Stable hemangioma in right hepatic lobe posteriorly. Stable cyst or hemangioma in right hepatic lobe anteriorly and lateral segment of left hepatic lobe. 3. Again  noted bilateral nonobstructive nephrolithiasis. No hydronephrosis or hydroureter. 4. No calcified ureteral calculi. Bilateral renal symmetrical excretion. 5. Normal retrocecal appendix.  No pericecal inflammation. 6. No small bowel obstruction. 7. Few diverticula are noted proximal sigmoid colon. No evidence of acute diverticulitis. 8. There is a cyst/follicle within right ovary measures 2 cm. Unremarkable uterus and left ovary.   Electronically Signed   By: Lahoma Crocker M.D.   On: 06/21/2014 15:16     EKG Interpretation None       CRITICAL CARE Performed by: Fredia Sorrow Total critical care time: 30 Critical care time was exclusive of separately billable procedures and treating other patients. Critical  care was necessary to treat or prevent imminent or life-threatening deterioration. Critical care was time spent personally by me on the following activities: development of treatment plan with patient and/or surrogate as well as nursing, discussions with consultants, evaluation of patient's response to treatment, examination of patient, obtaining history from patient or surrogate, ordering and performing treatments and interventions, ordering and review of laboratory studies, ordering and review of radiographic studies, pulse oximetry and re-evaluation of patient's condition.     MDM   Final diagnoses:  Abdominal pain  Diarrhea  Hypokalemia   Patient with significant diarrhea past 2 days. Has been ill since Monday. Was concerned about inflammatory bowel process in the intestines. CT scan rules out out. However patient does have significant hypokalemia. Patient started on 10 mEq of potassium IV piggyback here and second dose ordered. Patient will be admitted to telemetry for correction of the electrolytes. An addition of C. difficile of testing was done prior to admission.  I personally performed the services described in this documentation, which was scribed in my presence. The recorded information has been reviewed and is accurate.     Fredia Sorrow, MD 06/21/14 [phone]

## 2014-06-22 DIAGNOSIS — E876 Hypokalemia: Secondary | ICD-10-CM

## 2014-06-22 DIAGNOSIS — R197 Diarrhea, unspecified: Secondary | ICD-10-CM

## 2014-06-22 DIAGNOSIS — F319 Bipolar disorder, unspecified: Secondary | ICD-10-CM

## 2014-06-22 LAB — COMPREHENSIVE METABOLIC PANEL
ALBUMIN: 3.4 g/dL — AB (ref 3.5–5.2)
ALT: 49 U/L — ABNORMAL HIGH (ref 0–35)
AST: 29 U/L (ref 0–37)
Alkaline Phosphatase: 60 U/L (ref 39–117)
Anion gap: 5 (ref 5–15)
BILIRUBIN TOTAL: 1 mg/dL (ref 0.3–1.2)
BUN: 5 mg/dL — ABNORMAL LOW (ref 6–23)
CO2: 29 mmol/L (ref 19–32)
CREATININE: 0.68 mg/dL (ref 0.50–1.10)
Calcium: 8.1 mg/dL — ABNORMAL LOW (ref 8.4–10.5)
Chloride: 104 mEq/L (ref 96–112)
GFR calc Af Amer: >90 mL/min (ref >90)
GFR calc non Af Amer: >90 mL/min (ref >90)
GLUCOSE: 83 mg/dL (ref 70–99)
Potassium: 2.5 mmol/L — CL (ref 3.5–5.1)
Sodium: 138 mmol/L (ref 135–145)
Total Protein: 5.9 g/dL — ABNORMAL LOW (ref 6.0–8.3)

## 2014-06-22 LAB — CBC
HCT: 34.4 % — ABNORMAL LOW (ref 36.0–46.0)
HEMOGLOBIN: 11 g/dL — AB (ref 12.0–15.0)
MCH: 28.4 pg (ref 26.0–34.0)
MCHC: 32 g/dL (ref 30.0–36.0)
MCV: 88.9 fL (ref 78.0–100.0)
Platelets: 266 10*3/uL (ref 150–400)
RBC: 3.87 MIL/uL (ref 3.87–5.11)
RDW: 14.2 % (ref 11.5–15.5)
WBC: 5.2 10*3/uL (ref 4.0–10.5)

## 2014-06-22 MED ORDER — PANTOPRAZOLE SODIUM 40 MG PO TBEC
40.0000 mg | DELAYED_RELEASE_TABLET | Freq: Every day | ORAL | Status: DC
  Administered 2014-06-22 – 2014-06-23 (×2): 40 mg via ORAL
  Filled 2014-06-22: qty 1

## 2014-06-22 MED ORDER — POTASSIUM CHLORIDE CRYS ER 20 MEQ PO TBCR
40.0000 meq | EXTENDED_RELEASE_TABLET | Freq: Two times a day (BID) | ORAL | Status: DC
  Administered 2014-06-22 – 2014-06-23 (×3): 40 meq via ORAL
  Filled 2014-06-22 (×3): qty 2

## 2014-06-22 NOTE — Progress Notes (Signed)
06/22/2014 9:25am: Notified of critical lab value- potassium 2.5.  Dr. Verlon Au verbally notified and orders placed.

## 2014-06-22 NOTE — Care Management Utilization Note (Signed)
UR completed 

## 2014-06-22 NOTE — Progress Notes (Signed)
Patient with only one episode of minimal diarrhea in the morning.  Soft diet started at lunch and patient tolerated well.

## 2014-06-22 NOTE — Care Management Note (Signed)
    Page 1 of 1   06/22/2014     1:33:34 PM CARE MANAGEMENT NOTE 06/22/2014  Patient:  Bethany King, Bethany King   Account Number:  [id number]  Date Initiated:  06/22/2014  Documentation initiated by:  Theophilus Kinds  Subjective/Objective Assessment:   Pt admitted from home with possible gastroenteritis. Pt lives alone and will return home at discharge. Pt is independent with ADL's.     Action/Plan:   No CM needs noted. Anticipate discharge within 24 hours.   Anticipated DC Date:  06/23/2014   Anticipated DC Plan:  HOME/SELF CARE         Choice offered to / List presented to:             Status of service:  Completed, signed off Medicare Important Message given?   (If response is "NO", the following Medicare IM given date fields will be blank) Date Medicare IM given:   Medicare IM given by:   Date Additional Medicare IM given:   Additional Medicare IM given by:    Discharge Disposition:  HOME/SELF CARE  Per UR Regulation:    If discussed at Long Length of Stay Meetings, dates discussed:    Comments:  06/22/14 [place], RN BSN CM

## 2014-06-22 NOTE — Progress Notes (Signed)
Bethany King:[id number] DOB: [birth year] DOA: 06/21/2014 PCP: Trinna Post, MD  Brief narrative: 44 y/o ? L ovarian cyst, chronic cholecystitis s/p Lap chole 1.30.15, cystic breast lump, Endometrial ablation c Normal path 09/07/12, Migraines, sinusitis [seasonal], bipolar, htn Morbid obesity, Body mass index is 32.78 kg/(m^2). chronic GERD followed by Dr. Darrick Penna, prior h/o Colitis +/- Hematochezia 10/23/10 [EGD/COL=Internal hemorrhoids, 4 mm sigmoid colon polyp [nl path], normal esophagus admitted 06/21/14 with multiple episodes of profuse watery nonbloody diarrhea since 06/18/14. She had one episode of vomiting along with as well with out any blood. No ill contacts no foreign travel only went to the grocery store and then this started all of a sudden unclear what she may be an antecedent to this causes  Past medical history-As per Problem list Chart reviewed as below- Reviewed  Consultants:  None  Procedures:  None  Antibiotics:  None   Subjective  Alert pleasant oriented No diarrheoa so far today No nausea, no vomiting, no chest pain, no chills, no fever   Objective    Interim History: None  Telemetry: None   Objective: Filed Vitals:   06/21/14 1643 06/21/14 1835 06/21/14 2038 06/22/14 0634  BP: 139/80 145/77 131/68 110/64  Pulse: 71 75 77 83  Temp:  99.1 F (37.3 C) 98.4 F (36.9 C) 98.7 F (37.1 C)  TempSrc:   Oral Oral  Resp: 12 18  18   Height:      Weight:      SpO2: 100% 95% 100% 100%   No intake or output data in the 24 hours ending 06/22/14 1002  Exam:  General: EOMI NCAT Cardiovascular: S1-S2 no murmur rub or gallop Respiratory: Clinically clear no added sound Abdomen: Soft, nontender, nondistended Skin no lower extremity edema Neuro intact  Data Reviewed: Basic Metabolic Panel:  Recent Labs Lab 06/21/14 1413 06/22/14 0836  NA 141 138  K 2.6* 2.5*  CL 105 104  CO2 30 29  GLUCOSE 103* 83  BUN 6 5*  CREATININE 0.78 0.68    CALCIUM 8.6 8.1*   Liver Function Tests:  Recent Labs Lab 06/21/14 1413 06/22/14 0836  AST 39* 29  ALT 62* 49*  ALKPHOS 67 60  BILITOT 0.8 1.0  PROT 6.4 5.9*  ALBUMIN 3.8 3.4*    Recent Labs Lab 06/21/14 1413  LIPASE 20   No results for input(s): AMMONIA in the last 168 hours. CBC:  Recent Labs Lab 06/21/14 1413 06/22/14 0836  WBC 9.7 5.2  NEUTROABS 7.3  --   HGB 11.9* 11.0*  HCT 37.0 34.4*  MCV 88.7 88.9  PLT 280 266   Cardiac Enzymes: No results for input(s): CKTOTAL, CKMB, CKMBINDEX, TROPONINI in the last 168 hours. BNP: Invalid input(s): POCBNP CBG: No results for input(s): GLUCAP in the last 168 hours.  Recent Results (from the past 240 hour(s))  Clostridium Difficile by PCR     Status: None   Collection Time: 06/21/14  3:26 PM  Result Value Ref Range Status   C difficile by pcr NEGATIVE NEGATIVE Final     Studies:              All Imaging reviewed and is as per above notation   Scheduled Meds: . heparin  5,000 Units Subcutaneous 3 times per day  . loratadine  10 mg Oral Daily  . potassium chloride  40 mEq Oral BID  . propranolol  20 mg Oral BID  . sodium chloride  3 mL Intravenous Q12H  . Vilazodone  HCl  40 mg Oral Q breakfast   Continuous Infusions: . 0.9 % NaCl with KCl 40 mEq / L 100 mL/hr (06/22/14 0441)     Assessment/Plan: 1. Likely viral gastroenteritis-monitor. As patient is afebrile, and has no fever or chills we will hold off on antibiotics. Last prior antibiotic was ciprofloxacin given November 13 for simple cystitis so I doubt this is C. difficile colitis. Cut back IV fluids from 100 cc-->50 cc per hour. Allow soft diet today--if tolerates well and no further abdominal pain issues or nausea vomiting, could potentially discharge as early as 1/9 2. Hypokalemia-likely secondary to profuse potassium loss in stool. Continue saline with 40 of potassium in it at a lower rate, replace potassium orally sodium chloride 40 mEq twice a day,  monitor.  I have held her HCTZ 3. Migraine-continue propanolol 20 mg twice a day, Imitrex 50 mg q2 when necessary 4. Bipolar disease, continue Vilazadone 40 daily every morning, Ativan 0.5 3 times a day [consider discontinuation of the same as outpatient] 5. Lumbar disc disease [on disability for this]-continue Flexeril 10 mg 3 times a day, Norco 1-2 every 4 when necessary. 6. Cystic breast disease-needs mammogram every 2 yearly [dependent on guidelines used]. 7. Chronic cholecystitis status post lap chole 1.30.15 8. Chronic reflux-continue PPI-on this admission started Protonix 40 mg daily   Code Status: Full CODE STATUS Family Communication: No family at bedside Disposition Plan: Inpatient possible discharge in a.m.   Pleas Koch, MD  [facility] Pager [phone] 06/22/2014, 10:02 AM    LOS: 1 day

## 2014-06-23 DIAGNOSIS — R109 Unspecified abdominal pain: Secondary | ICD-10-CM

## 2014-06-23 DIAGNOSIS — K529 Noninfective gastroenteritis and colitis, unspecified: Secondary | ICD-10-CM

## 2014-06-23 DIAGNOSIS — R1084 Generalized abdominal pain: Secondary | ICD-10-CM

## 2014-06-23 LAB — COMPREHENSIVE METABOLIC PANEL
ALBUMIN: 3.2 g/dL — AB (ref 3.5–5.2)
ALT: 39 U/L — ABNORMAL HIGH (ref 0–35)
AST: 24 U/L (ref 0–37)
Alkaline Phosphatase: 56 U/L (ref 39–117)
Anion gap: 5 (ref 5–15)
BUN: 9 mg/dL (ref 6–23)
CHLORIDE: 108 meq/L (ref 96–112)
CO2: 27 mmol/L (ref 19–32)
Calcium: 8.4 mg/dL (ref 8.4–10.5)
Creatinine, Ser: 0.61 mg/dL (ref 0.50–1.10)
GLUCOSE: 89 mg/dL (ref 70–99)
Potassium: 3.6 mmol/L (ref 3.5–5.1)
Sodium: 140 mmol/L (ref 135–145)
Total Bilirubin: 0.9 mg/dL (ref 0.3–1.2)
Total Protein: 5.7 g/dL — ABNORMAL LOW (ref 6.0–8.3)

## 2014-06-23 LAB — CBC WITH DIFFERENTIAL/PLATELET
Basophils Absolute: 0 10*3/uL (ref 0.0–0.1)
Basophils Relative: 0 % (ref 0–1)
EOS ABS: 0.1 10*3/uL (ref 0.0–0.7)
Eosinophils Relative: 2 % (ref 0–5)
HEMATOCRIT: 32.6 % — AB (ref 36.0–46.0)
HEMOGLOBIN: 10.4 g/dL — AB (ref 12.0–15.0)
Lymphocytes Relative: 37 % (ref 12–46)
Lymphs Abs: 1.8 10*3/uL (ref 0.7–4.0)
MCH: 28.4 pg (ref 26.0–34.0)
MCHC: 31.9 g/dL (ref 30.0–36.0)
MCV: 89.1 fL (ref 78.0–100.0)
MONO ABS: 0.5 10*3/uL (ref 0.1–1.0)
Monocytes Relative: 10 % (ref 3–12)
Neutro Abs: 2.4 10*3/uL (ref 1.7–7.7)
Neutrophils Relative %: 51 % (ref 43–77)
PLATELETS: 262 10*3/uL (ref 150–400)
RBC: 3.66 MIL/uL — ABNORMAL LOW (ref 3.87–5.11)
RDW: 14 % (ref 11.5–15.5)
WBC: 4.7 10*3/uL (ref 4.0–10.5)

## 2014-06-23 NOTE — Progress Notes (Signed)
PROGRESS NOTE  Bethany King AOZ:[id number] DOB: 1970/07/09 DOA: 06/21/2014 PCP: Trinna Post, MD  Summary: 44 year old woman presented with three-day history of profuse diarrhea, abdominal cramping as well as some vomiting. Admitted for profuse diarrhea. CT scan of the abdomen and pelvis without inflammation of the bowel.  Assessment/Plan: 1. Generalized abdominal pain, profuse diarrhea. Resolved. Suspected viral gastroenteritis. Last treated with ciprofloxacin November 2015 for cystitis. C. difficile screen negative. 2. Hypokalemia. Resolved. Secondary to diarrhea, complicated by hydrochlorothiazide. 3. Elevated transaminases, trending towards normal. Expect spontaneous resolution. 4. Normocytic anemia. Stable. Appears to be at baseline. 5. GERD, IBS, ulcerative colitis 6. Chronic back pain on Lortab, Flexeril   Doing well, symptoms resolved.   Home  Brendia Sacks, MD  [facility]  Pager [phone] If 7PM-7AM, please contact night-coverage at [email or web address], password [facility] 06/23/2014, 5:23 PM  LOS: 2 days   Consultants:    Procedures:    Antibiotics:    HPI/Subjective: 1 stool recorded.  Feels great, eating well, 1 BM today. Wants to go home.  Objective: Filed Vitals:   06/22/14 1340 06/22/14 2233 06/23/14 0526 06/23/14 1344  BP: 113/62 99/49 117/74 113/67  Pulse: 74 78 78 83  Temp: 98.3 F (36.8 C) 98.6 F (37 C) 97.6 F (36.4 C) 98.3 F (36.8 C)  TempSrc: Oral Oral Oral Oral  Resp: 18 18 16 18   Height:      Weight:      SpO2: 100% 100% 100% 100%    Intake/Output Summary (Last 24 hours) at 06/23/14 1723 Last data filed at 06/23/14 1343  Gross per 24 hour  Intake   1463 ml  Output      5 ml  Net   1458 ml     Filed Weights   06/21/14 1301  Weight: 83.915 kg (185 lb)    Exam:     Afebrile, vital signs stable. No hypoxia. General:  Appears calm and comfortable Cardiovascular: RRR, no m/r/g.  Telemetry: SR, no arrhythmias    Respiratory: CTA bilaterally, no w/r/r. Normal respiratory effort. Abdomen: soft, ntnd  Data Reviewed:  Potassium 2.6 >> >> 3.6  Complete metabolic panel unremarkable with the exception of ALT which is now near normal  Hemoglobin stable 10.4.  Sector no acute disease.  CT abdomen pelvis no acute inflammatory process.  Scheduled Meds: . heparin  5,000 Units Subcutaneous 3 times per day  . loratadine  10 mg Oral Daily  . pantoprazole  40 mg Oral Daily  . potassium chloride  40 mEq Oral BID  . propranolol  20 mg Oral BID  . sodium chloride  3 mL Intravenous Q12H  . Vilazodone HCl  40 mg Oral Q breakfast   Continuous Infusions: . 0.9 % NaCl with KCl 40 mEq / L 50 mL/hr (06/23/14 1700)    Principal Problem:   Diarrhea Active Problems:   Hypokalemia   Abdominal pain

## 2014-06-23 NOTE — Progress Notes (Signed)
Pt discharged home.  Reviewed discharge information with patient, answered questions.  IV removed, pt tolerated well. Pt down to lobby to go home via wheelchair by [place], [place]

## 2014-06-23 NOTE — Discharge Summary (Addendum)
Physician Discharge Summary  Bethany King YNW:[id number] DOB: 06/10/1971 DOA: 06/21/2014  PCP: Trinna Post, MD  Admit date: 06/21/2014 Discharge date: 06/23/2014  Recommendations for Outpatient Follow-up:  1. Normocytic anemia   Follow-up Information    Follow up with Trinna Post, MD.   Specialty:  Family Medicine   Why:  As needed   Contact information:   [address] [place] [address] [phone]      Discharge Diagnoses:  1. Presumed acute viral gastroenteritis with generalized abdominal pain and profuse diarrhea, vomiting 2. Hypokalemia 3. Elevated transaminases 4. Normocytic anemia  Discharge Condition: improved Disposition: home  Diet recommendation: heart healthy  Filed Weights   06/21/14 1301  Weight: 83.915 kg (185 lb)    History of present illness:  44 year old woman presented with three-day history of profuse diarrhea, abdominal cramping as well as some vomiting. Admitted for profuse diarrhea. CT scan of the abdomen and pelvis without inflammation of the bowel.  Hospital Course:  Bethany King improved supportive care. Her clinical presentation was consistent with that of other patients who have presented this winter with a very painful gastroenteritis of transient duration. CT of the abdomen and pelvis was unremarkable. Potassium was repleted. Mild elevation of serum transaminases trending downwards. Expect spontaneous resolution.   Discharge Instructions  Discharge Instructions    Activity as tolerated - No restrictions    Complete by:  As directed      Diet - low sodium heart healthy    Complete by:  As directed      Discharge instructions    Complete by:  As directed   Call your physician or seek immediate medical attention for abdominal pain, vomiting, diarrhea or worsening of condition.          Current Discharge Medication List    CONTINUE these medications which have NOT CHANGED   Details  ALPRAZolam  (XANAX) 0.5 MG tablet Take 0.5 mg by mouth 3 (three) times daily as needed for sleep or anxiety.     cyclobenzaprine (FLEXERIL) 10 MG tablet Take 10 mg by mouth 3 (three) times daily as needed for muscle spasms.    hydrochlorothiazide (HYDRODIURIL) 25 MG tablet Take 25 mg by mouth daily.    HYDROcodone-acetaminophen (NORCO) 10-325 MG per tablet Take 1-2 tablets by mouth every 4 (four) hours as needed for moderate pain. Qty: 50 tablet, Refills: 0    loratadine (CLARITIN) 10 MG tablet Take 10 mg by mouth daily.    ondansetron (ZOFRAN) 4 MG tablet Take 4 mg by mouth every 6 (six) hours as needed for nausea.    potassium chloride (K-DUR) 10 MEQ tablet Take 1 tablet (10 mEq total) by mouth daily. Qty: 30 tablet, Refills: 6    Probiotic Product (HEALTHY COLON) CAPS Take 1 capsule by mouth daily.      propranolol (INDERAL) 20 MG tablet Take 1 tablet by mouth 2 (two) times daily. Refills: 1    SUMAtriptan (IMITREX) 50 MG tablet Take 50 mg by mouth as needed for migraine. May repeat in 2 hours if headache persists or recurs.    Vilazodone HCl (VIIBRYD) 40 MG TABS Take 40 mg by mouth daily.      STOP taking these medications     loperamide (IMODIUM) 2 MG capsule        Allergies  Allergen Reactions  . Hydromorphone Itching  . Prednisone     Increase anxiety symptoms, insomnia  . Tramadol Hcl Other (See Comments)    dizziness  .  Sulfonamide Derivatives Rash    The results of significant diagnostics from this hospitalization (including imaging, microbiology, ancillary and laboratory) are listed below for reference.    Significant Diagnostic Studies: Dg Chest 2 View  06/21/2014   CLINICAL DATA:  Severe abdomen pain since Monday.  EXAM: CHEST  2 VIEW  COMPARISON:  February 21, 2014  FINDINGS: The heart size and mediastinal contours are stable. There is no focal infiltrate, pulmonary edema, or pleural effusion. The visualized skeletal structures are unremarkable.  IMPRESSION: No active  cardiopulmonary disease.   Electronically Signed   By: Sherian Rein M.D.   On: 06/21/2014 15:38   Ct Abdomen Pelvis W Contrast  06/21/2014   CLINICAL DATA:  Generalized abdominal pain for 1 day, increased bowel movement  EXAM: CT ABDOMEN AND PELVIS WITH CONTRAST  TECHNIQUE: Multidetector CT imaging of the abdomen and pelvis was performed using the standard protocol following bolus administration of intravenous contrast.  CONTRAST:  OMNIPAQUE IOHEXOL 300 MG/ML  SOLN  COMPARISON:  11/12/2010 and 02/02/2013  FINDINGS: Sagittal images of the spine shows mild degenerative changes lower thoracic spine.  The lung bases are unremarkable. Enhanced liver shows no biliary ductal dilatation. The patient is status postcholecystectomy. Axial image 17 a hemangioma posterior aspect of the right hepatic lobe measures 2.6 cm stable in size in appearance from 5/ 30/12. A 8 mm cyst or hemangioma in right hepatic lobe anteriorly is stable in size from prior exam. Stable cyst or hemangioma in lateral segment of left hepatic lobe measures 1.4 cm.  The pancreas, spleen and adrenal glands are unremarkable.  There is mild cortical thinning in lower pole of the left kidney posterior aspect. Bilateral kidneys show symmetrical enhancement. No hydronephrosis or hydroureter. At least 4 or 5 nonobstructive calcified calculi are noted within right kidney the largest in lower pole measures 5.8 mm. At least 4 calcified nonobstructive calculi are noted within left kidney the largest in midpole measures 6 mm.  Delayed renal images shows bilateral renal symmetrical excretion. Bilateral visualized proximal ureter is unremarkable. There is a cyst in midpole medial aspect of the right kidney measures 1.2 cm.  No small bowel obstruction. No ascites or free air. No adenopathy. There is no pericecal inflammation. Normal retrocecal appendix noted axial image 53.  The uterus is unremarkable. There is a cyst/follicle within right ovary posterior aspect  measures 2 cm. The left ovary is unremarkable. No pelvic free fluid is noted. The urinary bladder is unremarkable. No evidence of colitis or diverticulitis. Few sigmoid colon diverticula are noted.  IMPRESSION: 1. No acute inflammatory process within abdomen or pelvis. 2. Stable hemangioma in right hepatic lobe posteriorly. Stable cyst or hemangioma in right hepatic lobe anteriorly and lateral segment of left hepatic lobe. 3. Again noted bilateral nonobstructive nephrolithiasis. No hydronephrosis or hydroureter. 4. No calcified ureteral calculi. Bilateral renal symmetrical excretion. 5. Normal retrocecal appendix.  No pericecal inflammation. 6. No small bowel obstruction. 7. Few diverticula are noted proximal sigmoid colon. No evidence of acute diverticulitis. 8. There is a cyst/follicle within right ovary measures 2 cm. Unremarkable uterus and left ovary.   Electronically Signed   By: Natasha Mead M.D.   On: 06/21/2014 15:16    Microbiology: Recent Results (from the past 240 hour(s))  Clostridium Difficile by PCR     Status: None   Collection Time: 06/21/14  3:26 PM  Result Value Ref Range Status   C difficile by pcr NEGATIVE NEGATIVE Final     Labs: Basic Metabolic  Panel:  Recent Labs Lab 06/21/14 1413 06/22/14 0836 06/23/14 0611  NA 141 138 140  K 2.6* 2.5* 3.6  CL 105 104 108  CO2 30 29 27   GLUCOSE 103* 83 89  BUN 6 5* 9  CREATININE 0.78 0.68 0.61  CALCIUM 8.6 8.1* 8.4   Liver Function Tests:  Recent Labs Lab 06/21/14 1413 06/22/14 0836 06/23/14 0611  AST 39* 29 24  ALT 62* 49* 39*  ALKPHOS 67 60 56  BILITOT 0.8 1.0 0.9  PROT 6.4 5.9* 5.7*  ALBUMIN 3.8 3.4* 3.2*    Recent Labs Lab 06/21/14 1413  LIPASE 20   CBC:  Recent Labs Lab 06/21/14 1413 06/22/14 0836 06/23/14 0611  WBC 9.7 5.2 4.7  NEUTROABS 7.3  --  2.4  HGB 11.9* 11.0* 10.4*  HCT 37.0 34.4* 32.6*  MCV 88.7 88.9 89.1  PLT 280 266 262    Principal Problem:   Acute gastroenteritis Active  Problems:   Diarrhea   Hypokalemia   Abdominal pain   Time coordinating discharge: 25 minutes  Signed:  Brendia Sacks, MD [facility] 06/23/2014, 5:43 PM

## 2014-08-15 ENCOUNTER — Other Ambulatory Visit (HOSPITAL_COMMUNITY): Payer: Self-pay | Admitting: Internal Medicine

## 2014-08-15 DIAGNOSIS — M5416 Radiculopathy, lumbar region: Secondary | ICD-10-CM

## 2014-08-15 DIAGNOSIS — M5126 Other intervertebral disc displacement, lumbar region: Secondary | ICD-10-CM

## 2014-08-23 ENCOUNTER — Ambulatory Visit (HOSPITAL_COMMUNITY)
Admission: RE | Admit: 2014-08-23 | Discharge: 2014-08-23 | Disposition: A | Discharge status: Home / Self Care | Payer: 59 | Source: Ambulatory Visit | Attending: Internal Medicine | Admitting: Internal Medicine

## 2014-08-23 DIAGNOSIS — M5126 Other intervertebral disc displacement, lumbar region: Secondary | ICD-10-CM | POA: Insufficient documentation

## 2014-08-23 DIAGNOSIS — M5416 Radiculopathy, lumbar region: Secondary | ICD-10-CM

## 2014-11-06 ENCOUNTER — Encounter: Payer: Self-pay | Admitting: *Deleted

## 2014-11-16 ENCOUNTER — Other Ambulatory Visit: Payer: Self-pay | Admitting: Obstetrics & Gynecology

## 2014-12-11 ENCOUNTER — Ambulatory Visit (HOSPITAL_COMMUNITY)
Admission: RE | Admit: 2014-12-11 | Discharge: 2014-12-11 | Disposition: A | Discharge status: Home / Self Care | Payer: 59 | Source: Ambulatory Visit | Attending: Urology | Admitting: Urology

## 2014-12-11 ENCOUNTER — Other Ambulatory Visit: Payer: Self-pay | Admitting: Urology

## 2014-12-11 DIAGNOSIS — N200 Calculus of kidney: Secondary | ICD-10-CM | POA: Diagnosis present

## 2014-12-13 ENCOUNTER — Ambulatory Visit (INDEPENDENT_AMBULATORY_CARE_PROVIDER_SITE_OTHER): Payer: 59 | Admitting: Obstetrics & Gynecology

## 2014-12-13 ENCOUNTER — Encounter: Payer: Self-pay | Admitting: Obstetrics & Gynecology

## 2014-12-13 ENCOUNTER — Other Ambulatory Visit (HOSPITAL_COMMUNITY)
Admission: RE | Admit: 2014-12-13 | Discharge: 2014-12-13 | Disposition: A | Discharge status: Home / Self Care | Payer: 59 | Source: Ambulatory Visit | Attending: Obstetrics & Gynecology | Admitting: Obstetrics & Gynecology

## 2014-12-13 VITALS — BP 116/70 | HR 80 | Ht 63.0 in | Wt 185.0 lb

## 2014-12-13 DIAGNOSIS — Z01419 Encounter for gynecological examination (general) (routine) without abnormal findings: Secondary | ICD-10-CM | POA: Insufficient documentation

## 2014-12-13 DIAGNOSIS — Z139 Encounter for screening, unspecified: Secondary | ICD-10-CM

## 2014-12-13 NOTE — Progress Notes (Signed)
Patient ID: Bethany King, female   DOB: 04/27/1971, 44 y.o.   MRN: [id number] Subjective:     Bethany King is a 44 y.o. female here for a routine exam.  No LMP recorded. Patient has had an ablation. G0P0 Birth Control Method:  none Menstrual Calendar(currently): spots for 4 days  Current complaints: none, IBS.   Current acute medical issues:  none   Recent Gynecologic History No LMP recorded. Patient has had an ablation. Last Pap: 2015,  normal Last mammogram: 12/12/2013,  normal  Past Medical History  Diagnosis Date  . GERD (gastroesophageal reflux disease)   . Irritable bowel syndrome (IBS)   . HTN (hypertension)   . Anxiety   . Depression   . Multiple hemangiomas 2006    hepatic, stable  . Costochondritis   . S/P endoscopy April 2012    mild gastritis, benign gastric polyp  . S/P colonoscopy April 2012    internal hemorrhoids, simple adenoma  . Iron deficiency anemia   . Hepatic hemangioma   . Cervical disc herniation   . Nephrolithiasis 2006    w/ hydronephrosis  . Recurrent UTI   . Medullary sponge kidney     left  . Complication of anesthesia   . PONV (postoperative nausea and vomiting)   . Pain     left leg  . Arthritis     Past Surgical History  Procedure Laterality Date  . Carpel tunnel release  03/2010 and 05/2010    bilateral  . Kidney stone surgery      numerou-s esl  . Foot surgery Bilateral     right- plantar fasciotomy  . Esophagogastroduodenoscopy  10/14/10  . Dilitation & currettage/hystroscopy with thermachoice ablation N/A 09/07/2012    Procedure: DILATATION & CURETTAGE/HYSTEROSCOPY WITH THERMACHOICE ABLATION;  Surgeon: Lazaro Arms, MD;  Location: [facility];  Service: Gynecology;  Laterality: N/A;  . Endometrial ablation  2014  . Cholecystectomy N/A 07/14/2013    Procedure: LAPAROSCOPIC CHOLECYSTECTOMY;  Surgeon: Dalia Heading, MD;  Location: [facility];  Service: General;  Laterality: N/A;    OB History    Gravida Para Term Preterm AB TAB  SAB Ectopic Multiple Living   0               History   Social History  . Marital Status: Divorced    Spouse Name: N/A  . Number of Children: 0  . Years of Education: N/A   Occupational History  . ORDER PROCESSER [employer]   Social History Main Topics  . Smoking status: Never Smoker   . Smokeless tobacco: Never Used  . Alcohol Use: No  . Drug Use: No  . Sexual Activity: Not Currently    Birth Control/ Protection: Surgical   Other Topics Concern  . None   Social History Narrative    Family History  Problem Relation Age of Onset  . Irritable bowel syndrome Mother   . Scleroderma Mother   . Rheum arthritis Mother   . Asthma Mother   . Colon cancer Cousin 81    second cousin Nettie [place])  . Melanoma Father   . Cancer Father     Melanoma  . Cancer Maternal Grandmother     Ovarian  . Diabetes Maternal Grandmother   . Thyroid disease Maternal Aunt      Current outpatient prescriptions:  .  ALPRAZolam (XANAX) 0.5 MG tablet, Take 0.5 mg by mouth 3 (three) times daily as needed for sleep or anxiety. ,  Disp: , Rfl:  .  hydrochlorothiazide (HYDRODIURIL) 25 MG tablet, Take 25 mg by mouth daily., Disp: , Rfl:  .  HYDROcodone-acetaminophen (NORCO) 10-325 MG per tablet, Take 1-2 tablets by mouth every 4 (four) hours as needed for moderate pain., Disp: 50 tablet, Rfl: 0 .  loratadine (CLARITIN) 10 MG tablet, Take 10 mg by mouth daily., Disp: , Rfl:  .  potassium chloride (K-DUR) 10 MEQ tablet, Take 1 tablet (10 mEq total) by mouth daily. (Patient taking differently: Take 40 mEq by mouth daily. ), Disp: 30 tablet, Rfl: 6 .  Probiotic Product (HEALTHY COLON) CAPS, Take 1 capsule by mouth daily.  , Disp: , Rfl:  .  SUMAtriptan (IMITREX) 50 MG tablet, Take 50 mg by mouth as needed for migraine. May repeat in 2 hours if headache persists or recurs., Disp: , Rfl:  .  Vilazodone HCl (VIIBRYD) 40 MG TABS, Take 40 mg by mouth daily., Disp: , Rfl:  .  cyclobenzaprine  (FLEXERIL) 10 MG tablet, Take 10 mg by mouth 3 (three) times daily as needed for muscle spasms., Disp: , Rfl:  .  ondansetron (ZOFRAN) 4 MG tablet, Take 4 mg by mouth every 6 (six) hours as needed for nausea., Disp: , Rfl:  .  propranolol (INDERAL) 20 MG tablet, Take 1 tablet by mouth 2 (two) times daily., Disp: , Rfl: 1  Review of Systems  Review of Systems  Constitutional: Negative for fever, chills, weight loss, malaise/fatigue and diaphoresis.  HENT: Negative for hearing loss, ear pain, nosebleeds, congestion, sore throat, neck pain, tinnitus and ear discharge.   Eyes: Negative for blurred vision, double vision, photophobia, pain, discharge and redness.  Respiratory: Negative for cough, hemoptysis, sputum production, shortness of breath, wheezing and stridor.   Cardiovascular: Negative for chest pain, palpitations, orthopnea, claudication, leg swelling and PND.  Gastrointestinal: negative for abdominal pain. Negative for heartburn, nausea, vomiting, diarrhea, constipation, blood in stool and melena.  Genitourinary: Negative for dysuria, urgency, frequency, hematuria and flank pain.  Musculoskeletal: Negative for myalgias, back pain, joint pain and falls.  Skin: Negative for itching and rash.  Neurological: Negative for dizziness, tingling, tremors, sensory change, speech change, focal weakness, seizures, loss of consciousness, weakness and headaches.  Endo/Heme/Allergies: Negative for environmental allergies and polydipsia. Does not bruise/bleed easily.  Psychiatric/Behavioral: Negative for depression, suicidal ideas, hallucinations, memory loss and substance abuse. The patient is not nervous/anxious and does not have insomnia.        Objective:  Blood pressure 116/70, pulse 80, height 5\' 3"  (1.6 m), weight 185 lb (83.915 kg).   Physical Exam  Vitals reviewed. Constitutional: She is oriented to person, place, and time. She appears well-developed and well-nourished.  HENT:  Head:  Normocephalic and atraumatic.        Right Ear: External ear normal.  Left Ear: External ear normal.  Nose: Nose normal.  Mouth/Throat: Oropharynx is clear and moist.  Eyes: Conjunctivae and EOM are normal. Pupils are equal, round, and reactive to light. Right eye exhibits no discharge. Left eye exhibits no discharge. No scleral icterus.  Neck: Normal range of motion. Neck supple. No tracheal deviation present. No thyromegaly present.  Cardiovascular: Normal rate, regular rhythm, normal heart sounds and intact distal pulses.  Exam reveals no gallop and no friction rub.   No murmur heard. Respiratory: Effort normal and breath sounds normal. No respiratory distress. She has no wheezes. She has no rales. She exhibits no tenderness.  GI: Soft. Bowel sounds are normal. She exhibits no distension  and no mass. There is no tenderness. There is no rebound and no guarding.  Genitourinary:  Breasts no masses skin changes or nipple changes bilaterally      Vulva is normal without lesions Vagina is pink moist without discharge Cervix normal in appearance and pap is done Uterus is normal size shape and contour Adnexa is negative with normal sized ovaries   Musculoskeletal: Normal range of motion. She exhibits no edema and no tenderness.  Neurological: She is alert and oriented to person, place, and time. She has normal reflexes. She displays normal reflexes. No cranial nerve deficit. She exhibits normal muscle tone. Coordination normal.  Skin: Skin is warm and dry. No rash noted. No erythema. No pallor.  Psychiatric: She has a normal mood and affect. Her behavior is normal. Judgment and thought content normal.       Assessment:    Healthy female exam.    Plan:    Mammogram ordered. Follow up in: 1 year.

## 2014-12-13 NOTE — Addendum Note (Signed)
Addended by: Doyne Keel on: 12/13/2014 03:05 PM   Modules accepted: Orders

## 2014-12-14 ENCOUNTER — Ambulatory Visit (INDEPENDENT_AMBULATORY_CARE_PROVIDER_SITE_OTHER): Payer: 59 | Admitting: Urology

## 2014-12-14 DIAGNOSIS — N200 Calculus of kidney: Secondary | ICD-10-CM | POA: Diagnosis not present

## 2014-12-19 LAB — CYTOLOGY - PAP

## 2014-12-26 ENCOUNTER — Other Ambulatory Visit: Payer: Self-pay | Admitting: Obstetrics & Gynecology

## 2014-12-26 DIAGNOSIS — Z1231 Encounter for screening mammogram for malignant neoplasm of breast: Secondary | ICD-10-CM

## 2015-01-04 ENCOUNTER — Ambulatory Visit (HOSPITAL_COMMUNITY): Payer: 59

## 2015-01-11 ENCOUNTER — Ambulatory Visit (HOSPITAL_COMMUNITY)
Admission: RE | Admit: 2015-01-11 | Discharge: 2015-01-11 | Disposition: A | Discharge status: Home / Self Care | Payer: 59 | Source: Ambulatory Visit | Attending: Obstetrics & Gynecology | Admitting: Obstetrics & Gynecology

## 2015-01-11 DIAGNOSIS — Z1231 Encounter for screening mammogram for malignant neoplasm of breast: Secondary | ICD-10-CM | POA: Insufficient documentation

## 2015-03-21 ENCOUNTER — Other Ambulatory Visit: Payer: Self-pay | Admitting: Urology

## 2015-03-26 ENCOUNTER — Encounter (HOSPITAL_BASED_OUTPATIENT_CLINIC_OR_DEPARTMENT_OTHER): Payer: Self-pay | Admitting: *Deleted

## 2015-03-26 NOTE — Progress Notes (Signed)
NPO AFTER MN.  ARRIVE AT 7185.  NEEDS ISTAT.  CURRENT EKG IN CHART AND EPIC. WILL TAKE AM MEDS WITH EXCEPTION NO HCTZ W/ SIPS OF WATER DOS.

## 2015-04-01 NOTE — H&P (Signed)
  Active Problems Problems  1. Bladder pain (R39.89)   Assessed By: Jimmey Ralph (Urology); Last Assessed: 19 Mar 2015 2. Incomplete bladder emptying (R33.9)   Assessed By: Jimmey Ralph (Urology); Last Assessed: 19 Mar 2015 3. Increased urinary frequency (R35.0)   Assessed By: Jimmey Ralph (Urology); Last Assessed: 19 Mar 2015 4. Microscopic hematuria (R31.29)   Assessed By: Jimmey Ralph (Urology); Last Assessed: 19 Mar 2015  History of Present Illness        44 YO female patient of Dr. Ralene Muskrat seen today for a 2 week f/u.     GU hx:   Hx of irritative voiding symptom in mid July and was seen by her PCP on 7/22 and was felt to have a UTI based on her UA.  She got cipro and the symptoms improved but the culture was negative.  The symptoms recurred and she returned on 8/11 and had an abnormal urine but neg culture. She also had a sinus infection and she was given keflex.  She was given a diet to follow for possible IC but still has symptoms. She has burning in the vaginal burning that is not associated with voiding usually.  She has had no hematuria.  She has frequency 7-10x but she voided 20 x one day when she really hydrated. It is worse over the past month.  She has nocturia x 1 and had enuresis one night.  She has some SUI with sneezing or coughing but she doesn't wear a pad. She has pain in the bladder when full and will get relief with voiding. She has no hesitancy and voids a large amount with a good stream but she doesn't feel she doesn't emptying. She has had multiple stones and the left side bothers her more than the right but she hasn't had much flank pain.  she has some lower back pain.  She uses Azo which doesn't help too much. She saw her GYN in June and had a normal w/u.     Interval Hx Sept 2016:   Today states no change in frequency and when she stands up post void will leak. Did not tolerate Uribel due to side effect of dizziness and blurred vision. Denies hematuria.  "I just don't think my bladder empties well."     Oct 2016 Interval hx:  Today states she did see mild improvement of bladder pain, urgency/frequency, and bladder emptying with Rapaflo 8 mg but had to stop medication due to headache and insomnia. "I am ready for that surgical procedure."    Vitals Vital Signs [Data Includes: Last 1 Day]  Recorded: 04Oct2016 01:25PM  Blood Pressure: 107 / 71 Temperature: 98.1 F Heart Rate: 73  Physical Exam Constitutional: Well nourished and well developed . No acute distress. The patient appears well hydrated.  Abdomen: The abdomen is mildly obese. The abdomen is soft and nontender. No suprapubic tenderness.    Results/Data  The following clinical lab reports were reviewed:  UA: microscopic hematuria.  PVR: Ultrasound PVR 0 ml. Previous PVR: 03/05/15 99 ml.    Assessment Assessed  1. Increased urinary frequency (R35.0) 2. Incomplete bladder emptying (R33.9) 3. Bladder pain (R39.89) 4. Microscopic hematuria (R31.29)  Plan  Will proceed with cysto/HOD w/Dr. Jeffie Pollock. Risks and benefits discussed which include: general anesthesia, hematuria, infection, and possible bladder injury. Voices understanding and all questions addressed. She wishes to proceed.

## 2015-04-02 ENCOUNTER — Ambulatory Visit (HOSPITAL_BASED_OUTPATIENT_CLINIC_OR_DEPARTMENT_OTHER): Payer: 59 | Admitting: Anesthesiology

## 2015-04-02 ENCOUNTER — Encounter (HOSPITAL_BASED_OUTPATIENT_CLINIC_OR_DEPARTMENT_OTHER)
Admission: RE | Disposition: A | Discharge status: Home / Self Care | Payer: Self-pay | Source: Ambulatory Visit | Attending: Urology

## 2015-04-02 ENCOUNTER — Encounter (HOSPITAL_BASED_OUTPATIENT_CLINIC_OR_DEPARTMENT_OTHER): Payer: Self-pay

## 2015-04-02 ENCOUNTER — Ambulatory Visit (HOSPITAL_BASED_OUTPATIENT_CLINIC_OR_DEPARTMENT_OTHER)
Admission: RE | Admit: 2015-04-02 | Discharge: 2015-04-02 | Disposition: A | Discharge status: Home / Self Care | Payer: 59 | Source: Ambulatory Visit | Attending: Urology | Admitting: Urology

## 2015-04-02 DIAGNOSIS — N393 Stress incontinence (female) (male): Secondary | ICD-10-CM | POA: Diagnosis not present

## 2015-04-02 DIAGNOSIS — F418 Other specified anxiety disorders: Secondary | ICD-10-CM | POA: Insufficient documentation

## 2015-04-02 DIAGNOSIS — K219 Gastro-esophageal reflux disease without esophagitis: Secondary | ICD-10-CM | POA: Insufficient documentation

## 2015-04-02 DIAGNOSIS — R51 Headache: Secondary | ICD-10-CM | POA: Diagnosis not present

## 2015-04-02 DIAGNOSIS — R339 Retention of urine, unspecified: Secondary | ICD-10-CM | POA: Diagnosis not present

## 2015-04-02 DIAGNOSIS — R350 Frequency of micturition: Secondary | ICD-10-CM | POA: Insufficient documentation

## 2015-04-02 DIAGNOSIS — R3989 Other symptoms and signs involving the genitourinary system: Secondary | ICD-10-CM | POA: Insufficient documentation

## 2015-04-02 DIAGNOSIS — R3129 Other microscopic hematuria: Secondary | ICD-10-CM | POA: Diagnosis not present

## 2015-04-02 DIAGNOSIS — I10 Essential (primary) hypertension: Secondary | ICD-10-CM | POA: Insufficient documentation

## 2015-04-02 HISTORY — DX: Allergic rhinitis, unspecified: J30.9

## 2015-04-02 HISTORY — DX: Personal history of other diseases of the digestive system: Z87.19

## 2015-04-02 HISTORY — DX: Migraine, unspecified, not intractable, without status migrainosus: G43.909

## 2015-04-02 HISTORY — DX: Urgency of urination: R39.15

## 2015-04-02 HISTORY — DX: Other intervertebral disc degeneration, lumbar region without mention of lumbar back pain or lower extremity pain: M51.369

## 2015-04-02 HISTORY — PX: CYSTO WITH HYDRODISTENSION: SHX5453

## 2015-04-02 HISTORY — DX: Other intervertebral disc degeneration, lumbar region: M51.36

## 2015-04-02 HISTORY — DX: Other symptoms and signs involving the genitourinary system: R39.89

## 2015-04-02 HISTORY — DX: Personal history of colonic polyps: Z86.010

## 2015-04-02 HISTORY — DX: Personal history of adenomatous and serrated colon polyps: Z86.0101

## 2015-04-02 HISTORY — DX: Personal history of urinary calculi: Z87.442

## 2015-04-02 LAB — POCT I-STAT 4, (NA,K, GLUC, HGB,HCT)
Glucose, Bld: 85 mg/dL (ref 65–99)
HCT: 37 % (ref 36.0–46.0)
Hemoglobin: 12.6 g/dL (ref 12.0–15.0)
POTASSIUM: 3.5 mmol/L (ref 3.5–5.1)
Sodium: 138 mmol/L (ref 135–145)

## 2015-04-02 SURGERY — CYSTOSCOPY, WITH BLADDER HYDRODISTENSION
Anesthesia: General | Site: Bladder

## 2015-04-02 MED ORDER — LIDOCAINE HCL (CARDIAC) 20 MG/ML IV SOLN
INTRAVENOUS | Status: DC | PRN
  Administered 2015-04-02: 80 mg via INTRAVENOUS

## 2015-04-02 MED ORDER — SODIUM CHLORIDE 0.9 % IJ SOLN
3.0000 mL | Freq: Two times a day (BID) | INTRAMUSCULAR | Status: DC
  Filled 2015-04-02: qty 3

## 2015-04-02 MED ORDER — ONDANSETRON HCL 4 MG/2ML IJ SOLN
INTRAMUSCULAR | Status: AC
  Filled 2015-04-02: qty 2

## 2015-04-02 MED ORDER — CEFAZOLIN SODIUM 1-5 GM-% IV SOLN
1.0000 g | INTRAVENOUS | Status: DC
  Filled 2015-04-02: qty 50

## 2015-04-02 MED ORDER — PHENAZOPYRIDINE HCL 100 MG PO TABS
ORAL_TABLET | ORAL | Status: AC
  Filled 2015-04-02: qty 2

## 2015-04-02 MED ORDER — HYDROCODONE-ACETAMINOPHEN 5-325 MG PO TABS: 1.0000 | ORAL_TABLET | Freq: Four times a day (QID) | ORAL | Status: DC | PRN

## 2015-04-02 MED ORDER — PHENAZOPYRIDINE HCL 200 MG PO TABS
ORAL | Status: DC | PRN
  Administered 2015-04-02: 30 mL via INTRAVESICAL

## 2015-04-02 MED ORDER — FENTANYL CITRATE (PF) 100 MCG/2ML IJ SOLN
INTRAMUSCULAR | Status: AC
  Filled 2015-04-02: qty 2

## 2015-04-02 MED ORDER — PHENAZOPYRIDINE HCL 200 MG PO TABS: 200.0000 mg | ORAL_TABLET | Freq: Three times a day (TID) | ORAL | Status: DC | PRN

## 2015-04-02 MED ORDER — STERILE WATER FOR IRRIGATION IR SOLN
Status: DC | PRN
  Administered 2015-04-02: 3000 mL via INTRAVESICAL

## 2015-04-02 MED ORDER — CEFAZOLIN SODIUM-DEXTROSE 2-3 GM-% IV SOLR
INTRAVENOUS | Status: AC
  Filled 2015-04-02: qty 50

## 2015-04-02 MED ORDER — SODIUM CHLORIDE 0.9 % IJ SOLN
3.0000 mL | INTRAMUSCULAR | Status: DC | PRN
  Filled 2015-04-02: qty 3

## 2015-04-02 MED ORDER — FENTANYL CITRATE (PF) 100 MCG/2ML IJ SOLN
INTRAMUSCULAR | Status: DC | PRN
  Administered 2015-04-02 (×4): 25 ug via INTRAVENOUS

## 2015-04-02 MED ORDER — MIDAZOLAM HCL 2 MG/2ML IJ SOLN
INTRAMUSCULAR | Status: AC
  Filled 2015-04-02: qty 2

## 2015-04-02 MED ORDER — KETOROLAC TROMETHAMINE 30 MG/ML IJ SOLN
INTRAMUSCULAR | Status: DC | PRN
  Administered 2015-04-02: 30 mg via INTRAVENOUS

## 2015-04-02 MED ORDER — ACETAMINOPHEN 10 MG/ML IV SOLN
INTRAVENOUS | Status: DC | PRN
  Administered 2015-04-02: 1000 mg via INTRAVENOUS

## 2015-04-02 MED ORDER — DEXAMETHASONE SODIUM PHOSPHATE 10 MG/ML IJ SOLN
INTRAMUSCULAR | Status: DC | PRN
  Administered 2015-04-02: 10 mg via INTRAVENOUS

## 2015-04-02 MED ORDER — METOCLOPRAMIDE HCL 5 MG/ML IJ SOLN
INTRAMUSCULAR | Status: DC | PRN
  Administered 2015-04-02: 10 mg via INTRAVENOUS

## 2015-04-02 MED ORDER — LACTATED RINGERS IV SOLN
INTRAVENOUS | Status: DC
  Administered 2015-04-02 (×2): via INTRAVENOUS
  Filled 2015-04-02: qty 1000

## 2015-04-02 MED ORDER — PHENAZOPYRIDINE HCL 200 MG PO TABS
200.0000 mg | ORAL_TABLET | Freq: Once | ORAL | Status: AC
  Administered 2015-04-02: 200 mg via ORAL
  Filled 2015-04-02: qty 1

## 2015-04-02 MED ORDER — PROPOFOL 10 MG/ML IV BOLUS
INTRAVENOUS | Status: DC | PRN
  Administered 2015-04-02: 200 mg via INTRAVENOUS

## 2015-04-02 MED ORDER — ACETAMINOPHEN 650 MG RE SUPP
650.0000 mg | RECTAL | Status: DC | PRN
  Filled 2015-04-02: qty 1

## 2015-04-02 MED ORDER — ACETAMINOPHEN 325 MG PO TABS
650.0000 mg | ORAL_TABLET | ORAL | Status: DC | PRN
  Filled 2015-04-02: qty 2

## 2015-04-02 MED ORDER — FENTANYL CITRATE (PF) 100 MCG/2ML IJ SOLN
25.0000 ug | INTRAMUSCULAR | Status: DC | PRN
  Filled 2015-04-02: qty 1

## 2015-04-02 MED ORDER — CEFAZOLIN SODIUM-DEXTROSE 2-3 GM-% IV SOLR
2.0000 g | INTRAVENOUS | Status: AC
  Administered 2015-04-02: 2 g via INTRAVENOUS
  Filled 2015-04-02: qty 50

## 2015-04-02 MED ORDER — ONDANSETRON HCL 4 MG/2ML IJ SOLN
INTRAMUSCULAR | Status: DC | PRN
  Administered 2015-04-02: 4 mg via INTRAVENOUS

## 2015-04-02 MED ORDER — OXYCODONE HCL 5 MG PO TABS
5.0000 mg | ORAL_TABLET | ORAL | Status: DC | PRN
  Filled 2015-04-02: qty 2

## 2015-04-02 MED ORDER — MIDAZOLAM HCL 5 MG/5ML IJ SOLN
INTRAMUSCULAR | Status: DC | PRN
  Administered 2015-04-02 (×2): 1 mg via INTRAVENOUS

## 2015-04-02 MED ORDER — ONDANSETRON HCL 4 MG/2ML IJ SOLN
4.0000 mg | Freq: Once | INTRAMUSCULAR | Status: AC
  Administered 2015-04-02: 4 mg via INTRAVENOUS
  Filled 2015-04-02: qty 2

## 2015-04-02 MED ORDER — SODIUM CHLORIDE 0.9 % IV SOLN
250.0000 mL | INTRAVENOUS | Status: DC | PRN
  Filled 2015-04-02: qty 250

## 2015-04-02 SURGICAL SUPPLY — 18 items
BAG DRAIN URO-CYSTO SKYTR STRL (DRAIN) ×2 IMPLANT
BAG DRN UROCATH (DRAIN) ×1
CATH ROBINSON RED A/P 16FR (CATHETERS) ×2 IMPLANT
CLOTH BEACON ORANGE TIMEOUT ST (SAFETY) ×2 IMPLANT
ELECT REM PT RETURN 9FT ADLT (ELECTROSURGICAL) ×2
ELECTRODE REM PT RTRN 9FT ADLT (ELECTROSURGICAL) IMPLANT
GLOVE SURG SS PI 7.5 STRL IVOR (GLOVE) ×2 IMPLANT
GLOVE SURG SS PI 8.0 STRL IVOR (GLOVE) ×2 IMPLANT
GOWN STRL REUS W/ TWL XL LVL3 (GOWN DISPOSABLE) ×1 IMPLANT
GOWN STRL REUS W/TWL LRG LVL3 (GOWN DISPOSABLE) ×1 IMPLANT
GOWN STRL REUS W/TWL XL LVL3 (GOWN DISPOSABLE) ×2
KIT ROOM TURNOVER WOR (KITS) ×3 IMPLANT
MANIFOLD NEPTUNE II (INSTRUMENTS) ×1 IMPLANT
NDL SAFETY ECLIPSE 18X1.5 (NEEDLE) ×1 IMPLANT
NEEDLE HYPO 18GX1.5 SHARP (NEEDLE) ×2
PACK CYSTO (CUSTOM PROCEDURE TRAY) ×2 IMPLANT
SYR 30ML LL (SYRINGE) ×2 IMPLANT
WATER STERILE IRR 3000ML UROMA (IV SOLUTION) ×2 IMPLANT

## 2015-04-02 NOTE — Anesthesia Procedure Notes (Signed)
Procedure Name: LMA Insertion Date/Time: 04/02/2015 9:57 AM Performed by: Justice Rocher Pre-anesthesia Checklist: Patient identified, Emergency Drugs available, Suction available and Patient being monitored Patient Re-evaluated:Patient Re-evaluated prior to inductionOxygen Delivery Method: Circle System Utilized Preoxygenation: Pre-oxygenation with 100% oxygen Intubation Type: IV induction Ventilation: Mask ventilation without difficulty LMA: LMA inserted LMA Size: 4.0 Number of attempts: 1 Airway Equipment and Method: Bite block Placement Confirmation: positive ETCO2 Tube secured with: Tape Dental Injury: Teeth and Oropharynx as per pre-operative assessment

## 2015-04-02 NOTE — Interval H&P Note (Signed)
History and Physical Interval Note:  04/02/2015 9:41 AM  Bethany King  has presented today for surgery, with the diagnosis of BLADDER PAIN  The various methods of treatment have been discussed with the patient and family. After consideration of risks, benefits and other options for treatment, the patient has consented to  Procedure(s): CYSTOSCOPY/HYDRODISTENSION (N/A) as a surgical intervention .  The patient's history has been reviewed, patient examined, no change in status, stable for surgery.  I have reviewed the patient's chart and labs.  Questions were answered to the patient's satisfaction.     Quantina Dershem J

## 2015-04-02 NOTE — Op Note (Deleted)
Bethany King, Bethany King             ACCOUNT NO.:  [id number]  MEDICAL RECORD NO.:  [id number]  LOCATION:  RAD                           FACILITY:  [facility]  PHYSICIAN:  Marshall Cork. Jeffie Pollock, M.D.    DATE OF BIRTH:  08/12/1970  DATE OF PROCEDURE:  04/02/2015 DATE OF DISCHARGE:                              OPERATIVE REPORT   PROCEDURE: 1. Cystoscopy with hydrodistention of the bladder. 2. Bladder biopsy and fulguration. 3. Instillation of Pyridium and Marcaine.  PREOPERATIVE DIAGNOSIS:  Painful bladder.  POSTOPERATIVE DIAGNOSIS:  Painful bladder with possible interstitial cystitis and right trigone lesion.  SURGEON:  Marshall Cork. Jeffie Pollock, MD  ANESTHESIA:  General.  SPECIMEN:  Cup biopsy from right trigone.  DRAINS:  None.  BLOOD LOSS:  None.  COMPLICATIONS:  None.  INDICATIONS:  [name] is a 44 year old white female with painful bladder who has elected hydrodistention for further diagnosis and treatment of her condition.  FINDINGS OF PROCEDURE:  She was given Cipro.  She was taken to the operating room where general anesthetic was induced.  She was placed in lithotomy position.  Her perineum and genitalia were prepped with Betadine solution and she was draped in the usual sterile fashion.  Cystoscopy was performed using the 23-French scope and 30-degree lens. Examination revealed a normal urethra.  The bladder wall was smooth and pale with minimal trabeculation.  The ureteral orifices were in the normal anatomic position effluxing clear urine.  Just lateral to the right ureteral orifice, there was a small approximately 3-4 mm erythematous area of mucosa, that was felt to require biopsy.  After initial cystoscopy, the bladder was filled to capacity under 80 cm of water pressure and drained her capacity under anesthesia was approximately 900 mL.  The bladder was then drained.  The efflux was clear.  Inspection of bladder revealed some glomerulations in the trigone, primarily  suggestive of interstitial cystitis.  Once reinspection was performed, a cold cut biopsy forceps was used to biopsy the lesion lateral to the right ureteral orifice.  The biopsy site was then fulgurated with a Bugbee electrode.  The bladder was drained.  The cystoscope was removed and a red rubber catheter was placed.  The bladder was then filled with 30 mL of 0.5% Marcaine with 400 mg of crushed Pyridium and the catheter was removed. The patient was taken down from lithotomy position.  Her anesthetic was reversed.  She was moved to recovery room in stable condition.  There were no complications.     Marshall Cork. Jeffie Pollock, M.D.     JJW/MEDQ  D:  04/02/2015  T:  04/02/2015  Job:  [id number]

## 2015-04-02 NOTE — Brief Op Note (Signed)
04/02/2015  10:13 AM  PATIENT:  Mikiya A Mengel  44 y.o. female  PRE-OPERATIVE DIAGNOSIS:  BLADDER PAIN  POST-OPERATIVE DIAGNOSIS:  BLADDER PAIN WITH BLADDER LESION  PROCEDURE:  Procedure(s): CYSTOSCOPY/HYDRODISTENSION (N/A) BLADDER BIOPSY WITH FULGURATION, INSTILLATION P&M.   SURGEON:  Surgeon(s) and Role:    * Irine Seal, MD - Primary  PHYSICIAN ASSISTANT:   ASSISTANTS: none   ANESTHESIA:   general  EBL:  Total I/O In: 300 [I.V.:300] Out: -   BLOOD ADMINISTERED:none  DRAINS: none   LOCAL MEDICATIONS USED:  MARCAINE    and Amount: 30 ml  SPECIMEN:  Source of Specimen:  RIGHT TRIGONE BIOPSY  DISPOSITION OF SPECIMEN:  PATHOLOGY  COUNTS:  YES  TOURNIQUET:  * No tourniquets in log *  DICTATION: .Other Dictation: Dictation Number [id number]  PLAN OF CARE: Discharge to home after PACU  PATIENT DISPOSITION:  PACU - hemodynamically stable.   Delay start of Pharmacological VTE agent (>24hrs) due to surgical blood loss or risk of bleeding: not applicable

## 2015-04-02 NOTE — Anesthesia Postprocedure Evaluation (Signed)
  Anesthesia Post-op Note  Patient: Bethany King  Procedure(s) Performed: Procedure(s): CYSTOSCOPY/HYDRODISTENSION, BLADDER BIOPSY WITH FULGURATION (N/A)  Patient Location: PACU  Anesthesia Type:General  Level of Consciousness: awake  Airway and Oxygen Therapy: Patient Spontanous Breathing  Post-op Pain: mild  Post-op Assessment: Post-op Vital signs reviewed              Post-op Vital Signs: Reviewed  Last Vitals:  Filed Vitals:   04/02/15 1021  BP: 115/62  Pulse: 62  Temp: 36.4 C  Resp: 15    Complications: No apparent anesthesia complications

## 2015-04-02 NOTE — Discharge Instructions (Addendum)

## 2015-04-02 NOTE — Addendum Note (Signed)
Addendum  created 04/02/15 1044 by Finis Bud, MD   Modules edited: Notes Section   Notes Section:  File: [id number]

## 2015-04-02 NOTE — Anesthesia Preprocedure Evaluation (Signed)
Anesthesia Evaluation  Patient identified by MRN, date of birth, ID band Patient awake    Reviewed: Allergy & Precautions, NPO status , Patient's Chart, lab work & pertinent test results  History of Anesthesia Complications (+) PONV  Airway Mallampati: II  TM Distance: >3 FB Neck ROM: Full    Dental   Pulmonary neg pulmonary ROS,    breath sounds clear to auscultation       Cardiovascular hypertension,  Rhythm:Regular Rate:Normal     Neuro/Psych  Headaches, Anxiety Depression  Neuromuscular disease    GI/Hepatic Neg liver ROS, GERD  ,  Endo/Other    Renal/GU Renal disease     Musculoskeletal   Abdominal   Peds  Hematology   Anesthesia Other Findings   Reproductive/Obstetrics                             Anesthesia Physical Anesthesia Plan  ASA: III  Anesthesia Plan: General   Post-op Pain Management:    Induction: Intravenous  Airway Management Planned: LMA  Additional Equipment:   Intra-op Plan:   Post-operative Plan: Extubation in OR  Informed Consent: I have reviewed the patients History and Physical, chart, labs and discussed the procedure including the risks, benefits and alternatives for the proposed anesthesia with the patient or authorized representative who has indicated his/her understanding and acceptance.   Dental advisory given  Plan Discussed with: CRNA  Anesthesia Plan Comments:         Anesthesia Quick Evaluation

## 2015-04-02 NOTE — Anesthesia Postprocedure Evaluation (Signed)
  Anesthesia Post-op Note  Patient: Bethany King  Procedure(s) Performed: Procedure(s): CYSTOSCOPY/HYDRODISTENSION, BLADDER BIOPSY WITH FULGURATION (N/A)  Patient Location: PACU  Anesthesia Type:General  Level of Consciousness: awake  Airway and Oxygen Therapy: Patient Spontanous Breathing  Post-op Pain: mild  Post-op Assessment: Post-op Vital signs reviewed              Post-op Vital Signs: Reviewed  Last Vitals:  Filed Vitals:   04/02/15 1030  BP: 122/69  Pulse: 61  Temp:   Resp: 15    Complications: No apparent anesthesia complications

## 2015-04-02 NOTE — Transfer of Care (Signed)
Immediate Anesthesia Transfer of Care Note  Patient: Bethany King  Procedure(s) Performed: Procedure(s) (LRB): CYSTOSCOPY/HYDRODISTENSION, BLADDER BIOPSY WITH FULGURATION (N/A)  Patient Location: PACU  Anesthesia Type: General  Level of Consciousness: awake, sedated, patient cooperative and responds to stimulation  Airway & Oxygen Therapy: Patient Spontanous Breathing and Patient connected to face mask oxygen  Post-op Assessment: Report given to PACU RN, Post -op Vital signs reviewed and stable and Patient moving all extremities  Post vital signs: Reviewed and stable  Complications: No apparent anesthesia complications

## 2015-04-02 NOTE — Op Note (Signed)
Bethany King, Bethany King             ACCOUNT NO.:  [id number]  MEDICAL RECORD NO.:  [id number]  LOCATION:                               FACILITY:  [facility]  PHYSICIAN:  Marshall Cork. Jeffie Pollock, M.D.    DATE OF BIRTH:  1971/03/31  DATE OF PROCEDURE:  04/02/2015 DATE OF DISCHARGE:  04/02/2015                              OPERATIVE REPORT   PROCEDURE: 1. Cystoscopy with hydrodistention of the bladder. 2. Bladder biopsy and fulguration. 3. Instillation of Pyridium and Marcaine.  PREOPERATIVE DIAGNOSIS:  Painful bladder.  POSTOPERATIVE DIAGNOSIS:  Painful bladder with possible interstitial cystitis and right trigone lesion.  SURGEON:  Marshall Cork. Jeffie Pollock, MD  ANESTHESIA:  General.  SPECIMEN:  Cup biopsy from right trigone.  DRAINS:  None.  BLOOD LOSS:  None.  COMPLICATIONS:  None.  INDICATIONS:  [name] is a 44 year old white female with painful bladder who has elected hydrodistention for further diagnosis and treatment of her condition.  FINDINGS OF PROCEDURE:  She was given Cipro.  She was taken to the operating room where general anesthetic was induced.  She was placed in lithotomy position.  Her perineum and genitalia were prepped with Betadine solution and she was draped in the usual sterile fashion.  Cystoscopy was performed using the 23-French scope and 30-degree lens. Examination revealed a normal urethra.  The bladder wall was smooth and pale with minimal trabeculation.  The ureteral orifices were in the normal anatomic position effluxing clear urine.  Just lateral to the right ureteral orifice, there was a small approximately 3-4 mm erythematous area of mucosa, that was felt to require biopsy.  After initial cystoscopy, the bladder was filled to capacity under 80 cm of water pressure and drained her capacity under anesthesia was approximately 900 mL.  The bladder was then drained.  The efflux was clear.  Inspection of bladder revealed some glomerulations in the trigone,  primarily suggestive of interstitial cystitis.  Once reinspection was performed, a cold cut biopsy forceps was used to biopsy the lesion lateral to the right ureteral orifice.  The biopsy site was then fulgurated with a Bugbee electrode.  The bladder was drained.  The cystoscope was removed and a red rubber catheter was placed.  The bladder was then filled with 30 mL of 0.5% Marcaine with 400 mg of crushed Pyridium and the catheter was removed. The patient was taken down from lithotomy position.  Her anesthetic was reversed.  She was moved to recovery room in stable condition.  There were no complications.     Marshall Cork. Jeffie Pollock, M.D.     JJW/MEDQ  D:  04/02/2015  T:  04/02/2015  Job:  [id number]

## 2015-04-03 ENCOUNTER — Encounter (HOSPITAL_BASED_OUTPATIENT_CLINIC_OR_DEPARTMENT_OTHER): Payer: Self-pay | Admitting: Urology

## 2015-05-01 ENCOUNTER — Ambulatory Visit (INDEPENDENT_AMBULATORY_CARE_PROVIDER_SITE_OTHER): Payer: 59 | Admitting: Urology

## 2015-05-01 DIAGNOSIS — R350 Frequency of micturition: Secondary | ICD-10-CM

## 2015-05-01 DIAGNOSIS — N3281 Overactive bladder: Secondary | ICD-10-CM

## 2015-05-01 DIAGNOSIS — R300 Dysuria: Secondary | ICD-10-CM

## 2015-06-07 ENCOUNTER — Ambulatory Visit (INDEPENDENT_AMBULATORY_CARE_PROVIDER_SITE_OTHER): Payer: 59 | Admitting: Urology

## 2015-06-07 DIAGNOSIS — N3281 Overactive bladder: Secondary | ICD-10-CM | POA: Diagnosis not present

## 2015-06-07 DIAGNOSIS — N200 Calculus of kidney: Secondary | ICD-10-CM | POA: Diagnosis not present

## 2015-11-22 ENCOUNTER — Other Ambulatory Visit (INDEPENDENT_AMBULATORY_CARE_PROVIDER_SITE_OTHER): Payer: BLUE CROSS/BLUE SHIELD

## 2015-11-22 ENCOUNTER — Encounter: Payer: Self-pay | Admitting: Obstetrics & Gynecology

## 2015-11-22 ENCOUNTER — Ambulatory Visit (INDEPENDENT_AMBULATORY_CARE_PROVIDER_SITE_OTHER): Payer: BLUE CROSS/BLUE SHIELD | Admitting: Obstetrics & Gynecology

## 2015-11-22 VITALS — BP 140/80 | HR 76 | Wt 179.0 lb

## 2015-11-22 DIAGNOSIS — Z7251 High risk heterosexual behavior: Secondary | ICD-10-CM | POA: Diagnosis not present

## 2015-11-22 DIAGNOSIS — Z113 Encounter for screening for infections with a predominantly sexual mode of transmission: Secondary | ICD-10-CM

## 2015-11-22 DIAGNOSIS — N83201 Unspecified ovarian cyst, right side: Secondary | ICD-10-CM

## 2015-11-22 DIAGNOSIS — N898 Other specified noninflammatory disorders of vagina: Secondary | ICD-10-CM

## 2015-11-22 DIAGNOSIS — R1032 Left lower quadrant pain: Secondary | ICD-10-CM | POA: Diagnosis not present

## 2015-11-22 DIAGNOSIS — N72 Inflammatory disease of cervix uteri: Secondary | ICD-10-CM

## 2015-11-22 DIAGNOSIS — N854 Malposition of uterus: Secondary | ICD-10-CM

## 2015-11-22 DIAGNOSIS — R102 Pelvic and perineal pain: Secondary | ICD-10-CM | POA: Diagnosis not present

## 2015-11-22 DIAGNOSIS — M545 Low back pain: Secondary | ICD-10-CM | POA: Diagnosis not present

## 2015-11-22 DIAGNOSIS — R110 Nausea: Secondary | ICD-10-CM | POA: Diagnosis not present

## 2015-11-22 MED ORDER — DOXYCYCLINE HYCLATE 100 MG PO TABS: 100.0000 mg | ORAL_TABLET | Freq: Two times a day (BID) | ORAL | Status: DC

## 2015-11-22 NOTE — Progress Notes (Signed)
PELVIC US TA/TV: Heterogenous anteverted uterus,EEC 4.6 mm,normal lt ov,simple rt ov cyst 2.5 x 1.7 x 1.7 cm,ov's appear to be mobile,lt adnexal pain during ultrasound,no free fluid

## 2015-11-22 NOTE — Progress Notes (Signed)
Patient ID: Bethany King, female   DOB: 08-19-1970, 45 y.o.   MRN: [id number]      Chief Complaint  Patient presents with  . work-in-pelvic pain    rt side ovary x 1 week/lower back, abdominal pain x 1 month and nausea    Blood pressure 140/80, pulse 76, weight 179 lb (81.2 kg).  45 y.o. G0P0 No LMP recorded. The current method of family planning is tubal ligation and ablation.  Subjective Pt had a new sexual contact and began having pelvic pain Has had discharge since no odor some irritation No ulcers or fever No known STD Wants to be evaluated for STD  Objective Gen WDWN female NAD Vulva:  normal appearing vulva with no masses, tenderness or lesions Vagina:  normal mucosa, thin grey discharge Cervix:  no cervical motion tenderness and no lesions Uterus:  normal size, contour, position, consistency, mobility, non-tender Adnexa: ovaries:present,  normal adnexa in size, nontender and no masses  Wet prep heavy wbc no tric of BV  Pertinent ROS No burning with urination, frequency or urgency No nausea, vomiting or diarrhea Nor fever chills or other constitutional symptoms   Labs or studies Wet prep as aboveGYNECOLOGIC SONOGRAM   Bethany King is a 45 y.o. G0P0 LMP 11/06/2015 for a pelvic sonogram for left lower quadrant pain.  Uterus                      8.6 x 5.2 x 4.3 cm, heterogenous anteverted uterus  Endometrium          4.6 mm, symmetrical, wnl  Right ovary             5.5 x 1.9 x 2.1 cm, simple rt ov cyst 2.5 x 1.7 x 1.7 cm  Left ovary                4 x 3.6 x 2.7 cm, wnl  Lt adnexal pain during ultrasound,no free fluid seen.  Technician Comments:  PELVIC US TA/TV: Heterogenous anteverted uterus,EEC 4.6 mm,normal lt ov,simple rt ov cyst 2.5 x 1.7 x 1.7 cm,ov's appear to be mobile,lt adnexal pain during ultrasound,no free fluid     [employer] 11/22/2015 12:23 PM  Clinical Impression and recommendations:  I have reviewed the  sonogram results above, combined with the patient's current clinical course, below are my impressions and any appropriate recommendations for management based on the sonographic findings.  Left ovary in particular is normal Endometrium thin consistent with ablation Uterus normal  Physiologic right ovarian cyst   Zorawar Strollo H 11/22/2015 12:31 PM        Impression Diagnoses this Encounter::   ICD-9-CM ICD-10-CM   1. Left lower quadrant pain 789.04 R10.32 US Pelvis Complete     US Transvaginal Non-OB  2. Cervicitis 616.0 N72   3. Screening for STD (sexually transmitted disease) V74.5 Z11.3 GC/Chlamydia Probe Amp  4. High risk sexual behavior V69.2 Z72.51 HIV antibody     RPR     Hepatitis C Antibody    Established relevant diagnosis(es):   Plan/Recommendations: Meds ordered this encounter  Medications  . DISCONTD: doxycycline (VIBRA-TABS) 100 MG tablet    Sig: Take 1 tablet (100 mg total) by mouth 2 (two) times daily.    Dispense:  20 tablet    Refill:  0    Labs or Scans Ordered: Orders Placed This Encounter  Procedures  . GC/Chlamydia Probe Amp  . US Pelvis Complete  . US Transvaginal Non-OB  .  HIV antibody  . RPR  . Hepatitis C Antibody    Management:: Cover cervicitis with doxycycline Sonogram normal  Follow up Return in about 2 weeks (around 12/06/2015) for Follow up, with Dr Despina Hidden..   All questions were answered.

## 2015-11-23 LAB — HIV ANTIBODY (ROUTINE TESTING W REFLEX): HIV Screen 4th Generation wRfx: NONREACTIVE

## 2015-11-23 LAB — HEPATITIS C ANTIBODY

## 2015-11-23 LAB — RPR: RPR Ser Ql: NONREACTIVE

## 2015-11-26 ENCOUNTER — Telehealth: Payer: Self-pay | Admitting: Obstetrics & Gynecology

## 2015-11-26 LAB — GC/CHLAMYDIA PROBE AMP
Chlamydia trachomatis, NAA: NEGATIVE
Neisseria gonorrhoeae by PCR: NEGATIVE

## 2015-11-26 NOTE — Telephone Encounter (Signed)
Pt informed per Dr. Elonda Husky nonspecific Cervicitis. Pt verbalized understanding.

## 2015-11-26 NOTE — Telephone Encounter (Signed)
Non specific as i told her in the office  Just need a cervix

## 2015-11-26 NOTE — Telephone Encounter (Signed)
Pt informed of Negative GC/CHL, HIV, RPR, Hep C.   Pt states what caused the Cervicitis?

## 2015-12-12 ENCOUNTER — Other Ambulatory Visit: Payer: Self-pay | Admitting: Urology

## 2015-12-12 ENCOUNTER — Ambulatory Visit (HOSPITAL_COMMUNITY)
Admission: RE | Admit: 2015-12-12 | Discharge: 2015-12-12 | Disposition: A | Discharge status: Home / Self Care | Payer: BLUE CROSS/BLUE SHIELD | Source: Ambulatory Visit | Attending: Urology | Admitting: Urology

## 2015-12-12 DIAGNOSIS — N200 Calculus of kidney: Secondary | ICD-10-CM | POA: Diagnosis not present

## 2015-12-13 ENCOUNTER — Other Ambulatory Visit (HOSPITAL_COMMUNITY)
Admission: RE | Admit: 2015-12-13 | Discharge: 2015-12-13 | Disposition: A | Discharge status: Home / Self Care | Payer: BLUE CROSS/BLUE SHIELD | Source: Other Acute Inpatient Hospital | Attending: Urology | Admitting: Urology

## 2015-12-13 ENCOUNTER — Ambulatory Visit (INDEPENDENT_AMBULATORY_CARE_PROVIDER_SITE_OTHER): Payer: BLUE CROSS/BLUE SHIELD | Admitting: Urology

## 2015-12-13 DIAGNOSIS — N3000 Acute cystitis without hematuria: Secondary | ICD-10-CM

## 2015-12-13 DIAGNOSIS — N200 Calculus of kidney: Secondary | ICD-10-CM

## 2015-12-13 DIAGNOSIS — N760 Acute vaginitis: Secondary | ICD-10-CM | POA: Diagnosis not present

## 2015-12-13 LAB — URINALYSIS, ROUTINE W REFLEX MICROSCOPIC
BILIRUBIN URINE: NEGATIVE
Glucose, UA: NEGATIVE mg/dL
HGB URINE DIPSTICK: NEGATIVE
Ketones, ur: NEGATIVE mg/dL
NITRITE: NEGATIVE
PROTEIN: NEGATIVE mg/dL
Specific Gravity, Urine: <1.005 — ABNORMAL LOW (ref 1.005–1.030)
pH: 6 (ref 5.0–8.0)

## 2015-12-13 LAB — URINE MICROSCOPIC-ADD ON: RBC / HPF: NONE SEEN RBC/hpf (ref 0–5)

## 2015-12-15 LAB — URINE CULTURE: Culture: 20000 — AB

## 2015-12-25 ENCOUNTER — Telehealth: Payer: Self-pay | Admitting: *Deleted

## 2015-12-25 ENCOUNTER — Ambulatory Visit (INDEPENDENT_AMBULATORY_CARE_PROVIDER_SITE_OTHER): Payer: BLUE CROSS/BLUE SHIELD | Admitting: Obstetrics & Gynecology

## 2015-12-25 ENCOUNTER — Telehealth: Payer: Self-pay | Admitting: Obstetrics & Gynecology

## 2015-12-25 ENCOUNTER — Other Ambulatory Visit (HOSPITAL_COMMUNITY)
Admission: RE | Admit: 2015-12-25 | Discharge: 2015-12-25 | Disposition: A | Discharge status: Home / Self Care | Payer: BLUE CROSS/BLUE SHIELD | Source: Ambulatory Visit | Attending: Obstetrics & Gynecology | Admitting: Obstetrics & Gynecology

## 2015-12-25 ENCOUNTER — Encounter: Payer: Self-pay | Admitting: Obstetrics & Gynecology

## 2015-12-25 ENCOUNTER — Other Ambulatory Visit: Payer: Self-pay | Admitting: Obstetrics & Gynecology

## 2015-12-25 VITALS — BP 120/80 | HR 76 | Ht 63.0 in | Wt 182.0 lb

## 2015-12-25 DIAGNOSIS — Z01411 Encounter for gynecological examination (general) (routine) with abnormal findings: Secondary | ICD-10-CM | POA: Diagnosis not present

## 2015-12-25 DIAGNOSIS — Z01419 Encounter for gynecological examination (general) (routine) without abnormal findings: Secondary | ICD-10-CM | POA: Insufficient documentation

## 2015-12-25 DIAGNOSIS — N72 Inflammatory disease of cervix uteri: Secondary | ICD-10-CM | POA: Diagnosis not present

## 2015-12-25 MED ORDER — DOXYCYCLINE HYCLATE 100 MG PO TABS: 100.0000 mg | ORAL_TABLET | Freq: Two times a day (BID) | ORAL | Status: DC

## 2015-12-25 MED ORDER — ESTROGENS, CONJUGATED 0.625 MG/GM VA CREA: TOPICAL_CREAM | VAGINAL | Status: DC

## 2015-12-25 MED ORDER — ESTRADIOL 0.1 MG/GM VA CREA: 1.0000 | TOPICAL_CREAM | Freq: Every day | VAGINAL | Status: DC

## 2015-12-25 NOTE — Telephone Encounter (Signed)
The whole point is to use vaginal estrogen not systemic  I will e prescribed premarin vaginal cream and see if that is more reasonable

## 2015-12-25 NOTE — Telephone Encounter (Signed)
Duplicate message. 

## 2015-12-25 NOTE — Progress Notes (Signed)
Patient ID: Bethany King, female   DOB: 10-28-1970, 45 y.o.   MRN: [id number] Subjective:     Bethany King is a 45 y.o. female here for a routine exam.  Patient's last menstrual period was 11/28/2015. G0P0 Birth Control Method:  ablation Menstrual Calendar(currently): minimal irregular spotting  Current complaints: persistent discharge.   Current acute medical issues:  none   Recent Gynecologic History Patient's last menstrual period was 11/28/2015. Last Pap: 2016,  normal Last mammogram: 2016,  normal  Past Medical History:  Diagnosis Date  . Allergic rhinitis   . Anxiety   . Arthritis   . Bladder pain   . Complication of anesthesia   . Costochondritis    hx  . DDD (degenerative disc disease), lumbar   . Depression   . GERD (gastroesophageal reflux disease)   . Hepatic hemangioma 2006   stable  . History of adenomatous polyp of colon   . History of colitis   . History of gastritis   . History of kidney stones   . HTN (hypertension)   . Irritable bowel syndrome (IBS)   . Medullary sponge kidney    left  . Migraine   . Nephrolithiasis    bilateral-- nonobstructive per CT  . PONV (postoperative nausea and vomiting)   . Urgency of urination     Past Surgical History:  Procedure Laterality Date  . ANTERIOR CERVICAL DECOMP/DISCECTOMY FUSION  02-26-2014   C5 - C6  . CARPAL TUNNEL RELEASE Bilateral right 04-01-2010/  left 05-30-2010  . CHOLECYSTECTOMY N/A 07/14/2013   Procedure: LAPAROSCOPIC CHOLECYSTECTOMY;  Surgeon: Dalia Heading, MD;  Location: [facility];  Service: General;  Laterality: N/A;  . COLONOSCOPY  10-13-2010  . CYSTO WITH HYDRODISTENSION N/A 04/02/2015   Procedure: CYSTOSCOPY/HYDRODISTENSION, BLADDER BIOPSY WITH FULGURATION;  Surgeon: Bjorn Pippin, MD;  Location[facility];  Service: Urology;  Laterality: N/A;  . DILITATION & CURRETTAGE/HYSTROSCOPY WITH THERMACHOICE ABLATION N/A 09/07/2012   Procedure: DILATATION & CURETTAGE/HYSTEROSCOPY  WITH THERMACHOICE ABLATION;  Surgeon: Lazaro Arms, MD;  Location: [facility];  Service: Gynecology;  Laterality: N/A;  . ESOPHAGOGASTRODUODENOSCOPY  10/14/10  . EXTRACORPOREAL SHOCK WAVE LITHOTRIPSY  multiple  . PERCUTANEOUS NEPHROSTOLITHOTOMY  2003  . PLANTAR FASCIA SURGERY Bilateral right 2008//  left 2010    OB History    Gravida Para Term Preterm AB Living   0             SAB TAB Ectopic Multiple Live Births                  Social History   Social History  . Marital status: Divorced    Spouse name: N/A  . Number of children: 0  . Years of education: N/A   Occupational History  . ORDER PROCESSER [employer]   Social History Main Topics  . Smoking status: Never Smoker  . Smokeless tobacco: Never Used  . Alcohol use No  . Drug use: No  . Sexual activity: Not Asked   Other Topics Concern  . None   Social History Narrative  . None    Family History  Problem Relation Age of Onset  . Irritable bowel syndrome Mother   . Scleroderma Mother   . Rheum arthritis Mother   . Asthma Mother   . Melanoma Father   . Cancer Father     Melanoma  . Colon cancer Cousin 61    second cousin Nettie [place])  . Cancer Maternal Grandmother  Ovarian  . Diabetes Maternal Grandmother   . Thyroid disease Maternal Aunt      Current Outpatient Prescriptions:  .  ALPRAZolam (XANAX) 1 MG tablet, Take 1 mg by mouth 3 (three) times daily as needed for anxiety., Disp: , Rfl:  .  cyclobenzaprine (FLEXERIL) 10 MG tablet, Take 10 mg by mouth 3 (three) times daily as needed for muscle spasms., Disp: , Rfl:  .  hydrochlorothiazide (HYDRODIURIL) 25 MG tablet, Take 25 mg by mouth every morning. , Disp: , Rfl:  .  HYDROcodone-acetaminophen (NORCO/VICODIN) 5-325 MG tablet, Take 1 tablet by mouth every 6 (six) hours as needed for moderate pain., Disp: 20 tablet, Rfl: 0 .  potassium chloride SA (K-DUR,KLOR-CON) 20 MEQ tablet, Take 40 mEq by mouth every morning., Disp: , Rfl:  .  Probiotic  Product (HEALTHY COLON) CAPS, Take 1 capsule by mouth daily.  , Disp: , Rfl:  .  Vilazodone HCl (VIIBRYD) 40 MG TABS, Take 40 mg by mouth every morning. , Disp: , Rfl:  .  conjugated estrogens (PREMARIN) vaginal cream, 1 applicator at bedtime, Disp: 30 g, Rfl: 12  Review of Systems  Review of Systems  Constitutional: Negative for fever, chills, weight loss, malaise/fatigue and diaphoresis.  HENT: Negative for hearing loss, ear pain, nosebleeds, congestion, sore throat, neck pain, tinnitus and ear discharge.   Eyes: Negative for blurred vision, double vision, photophobia, pain, discharge and redness.  Respiratory: Negative for cough, hemoptysis, sputum production, shortness of breath, wheezing and stridor.   Cardiovascular: Negative for chest pain, palpitations, orthopnea, claudication, leg swelling and PND.  Gastrointestinal: negative for abdominal pain. Negative for heartburn, nausea, vomiting, diarrhea, constipation, blood in stool and melena.  Genitourinary: Negative for dysuria, urgency, frequency, hematuria and flank pain.  Musculoskeletal: Negative for myalgias, back pain, joint pain and falls.  Skin: Negative for itching and rash.  Neurological: Negative for dizziness, tingling, tremors, sensory change, speech change, focal weakness, seizures, loss of consciousness, weakness and headaches.  Endo/Heme/Allergies: Negative for environmental allergies and polydipsia. Does not bruise/bleed easily.  Psychiatric/Behavioral: Negative for depression, suicidal ideas, hallucinations, memory loss and substance abuse. The patient is not nervous/anxious and does not have insomnia.        Objective:  Blood pressure 120/80, pulse 76, height 5\' 3"  (1.6 m), weight 182 lb (82.6 kg), last menstrual period 11/28/2015.   Physical Exam  Vitals reviewed. Constitutional: She is oriented to person, place, and time. She appears well-developed and well-nourished.  HENT:  Head: Normocephalic and atraumatic.         Right Ear: External ear normal.  Left Ear: External ear normal.  Nose: Nose normal.  Mouth/Throat: Oropharynx is clear and moist.  Eyes: Conjunctivae and EOM are normal. Pupils are equal, round, and reactive to light. Right eye exhibits no discharge. Left eye exhibits no discharge. No scleral icterus.  Neck: Normal range of motion. Neck supple. No tracheal deviation present. No thyromegaly present.  Cardiovascular: Normal rate, regular rhythm, normal heart sounds and intact distal pulses.  Exam reveals no gallop and no friction rub.   No murmur heard. Respiratory: Effort normal and breath sounds normal. No respiratory distress. She has no wheezes. She has no rales. She exhibits no tenderness.  GI: Soft. Bowel sounds are normal. She exhibits no distension and no mass. There is no tenderness. There is no rebound and no guarding.  Genitourinary:  Breasts no masses skin changes or nipple changes bilaterally      Vulva is normal without lesions Vagina is  pink moist with persistent discharge Cervix normal in appearance and pap is done Uterus is normal size shape and contour Adnexa is negative with normal sized ovaries   Musculoskeletal: Normal range of motion. She exhibits no edema and no tenderness.  Neurological: She is alert and oriented to person, place, and time. She has normal reflexes. She displays normal reflexes. No cranial nerve deficit. She exhibits normal muscle tone. Coordination normal.  Skin: Skin is warm and dry. No rash noted. No erythema. No pallor.  Psychiatric: She has a normal mood and affect. Her behavior is normal. Judgment and thought content normal.       Medications Ordered at today's visit: Meds ordered this encounter  Medications  . DISCONTD: doxycycline (VIBRA-TABS) 100 MG tablet    Sig: Take 1 tablet (100 mg total) by mouth 2 (two) times daily.    Dispense:  42 tablet    Refill:  0  . DISCONTD: estradiol (ESTRACE VAGINAL) 0.1 MG/GM vaginal cream    Sig:  Place 1 Applicatorful vaginally at bedtime.    Dispense:  42.5 g    Refill:  12    Other orders placed at today's visit: No orders of the defined types were placed in this encounter.     Assessment:    Healthy female exam.    Plan:    Follow up in: 1 year.for yearly     Return in about 3 weeks (around 01/15/2016) for Follow up, with Dr Despina Hidden.

## 2015-12-25 NOTE — Telephone Encounter (Signed)
Pt insurance will not cover Estrace vaginal gel but will cover Estradiol tablets. Pt requesting new RX for Estradiol tablet.

## 2015-12-26 ENCOUNTER — Telehealth: Payer: Self-pay | Admitting: *Deleted

## 2015-12-26 NOTE — Telephone Encounter (Signed)
Pt informed Premarin cream prescribed

## 2015-12-26 NOTE — Telephone Encounter (Signed)
Spoke with pharmacist at [facility] gave verbal clarification per Dr.Eure Premarin Cream 1 gram nightly.

## 2015-12-27 LAB — CYTOLOGY - PAP

## 2016-01-15 ENCOUNTER — Ambulatory Visit: Payer: BLUE CROSS/BLUE SHIELD | Admitting: Obstetrics & Gynecology

## 2016-01-27 ENCOUNTER — Ambulatory Visit (INDEPENDENT_AMBULATORY_CARE_PROVIDER_SITE_OTHER): Payer: BLUE CROSS/BLUE SHIELD | Admitting: Obstetrics & Gynecology

## 2016-01-27 ENCOUNTER — Encounter: Payer: Self-pay | Admitting: Obstetrics & Gynecology

## 2016-01-27 VITALS — BP 110/80 | HR 80 | Ht 63.0 in | Wt 181.3 lb

## 2016-01-27 DIAGNOSIS — N72 Inflammatory disease of cervix uteri: Secondary | ICD-10-CM | POA: Diagnosis not present

## 2016-01-27 DIAGNOSIS — N952 Postmenopausal atrophic vaginitis: Secondary | ICD-10-CM | POA: Diagnosis not present

## 2016-01-27 NOTE — Progress Notes (Signed)
      Chief Complaint  Patient presents with  . Follow-up    cervicitis    Blood pressure 110/80, pulse 80, height 5\' 3"  (1.6 m), weight 181 lb 4.8 oz (82.2 kg).  45 y.o. G0P0 No LMP recorded. The current method of family planning is post menopausal status.  Subjective Patient has been having a and issue with vaginal discharge for the past few months Her wet preps have revealed this to be a high white blood cell cervicitis vaginitis no yeast no Trichomonas no bacterial vaginosis She has had a course of doxycycline which cleared up but it subsequently came back She is been placed on a probiotic and a longer course of doxycycline and begun on Premarin vaginal cream He's been using the Premarin cream episodically  Today she states that her discharge is better without any new thickened irritation  Objective On exam today the vulva was normal and the vagina has scant discharge with no evidence of the cervicitis vaginitis The cervix has a positive changes and otherwise is negative and nontender  Pertinent ROS Per history of present illness No burning with urination, frequency or urgency No nausea, vomiting or diarrhea Nor fever chills or other constitutional symptoms   Labs or studies None today    Impression Diagnoses this Encounter::   ICD-9-CM ICD-10-CM   1. Cervicitis 616.0 N72   2. Post-menopausal atrophic vaginitis 627.3 N95.2     Established relevant diagnosis(es):   Plan/Recommendations: No orders of the defined types were placed in this encounter.   Labs or Scans Ordered: No orders of the defined types were placed in this encounter.   Management:: Continue the nightly Premarin vaginal cream for at least 3 months and I will see her back in 3 months and maybe we can decrease it to every other night thereafter She is having some hot flashes currently as well  Follow up Return in about 3 months (around 04/28/2016) for Follow up, with Dr Elonda Husky.        All questions were answered.

## 2016-02-28 ENCOUNTER — Other Ambulatory Visit (HOSPITAL_COMMUNITY)
Admission: AD | Admit: 2016-02-28 | Discharge: 2016-02-28 | Disposition: A | Discharge status: Home / Self Care | Payer: BLUE CROSS/BLUE SHIELD | Source: Other Acute Inpatient Hospital | Attending: Urology | Admitting: Urology

## 2016-02-28 ENCOUNTER — Other Ambulatory Visit: Payer: Self-pay | Admitting: Family Medicine

## 2016-02-28 ENCOUNTER — Ambulatory Visit (HOSPITAL_COMMUNITY)
Admission: RE | Admit: 2016-02-28 | Discharge: 2016-02-28 | Disposition: A | Discharge status: Home / Self Care | Payer: BLUE CROSS/BLUE SHIELD | Source: Ambulatory Visit | Attending: Family Medicine | Admitting: Family Medicine

## 2016-02-28 ENCOUNTER — Ambulatory Visit (INDEPENDENT_AMBULATORY_CARE_PROVIDER_SITE_OTHER): Payer: BLUE CROSS/BLUE SHIELD | Admitting: Urology

## 2016-02-28 DIAGNOSIS — N201 Calculus of ureter: Secondary | ICD-10-CM

## 2016-02-28 DIAGNOSIS — N200 Calculus of kidney: Secondary | ICD-10-CM | POA: Insufficient documentation

## 2016-02-28 DIAGNOSIS — N3000 Acute cystitis without hematuria: Secondary | ICD-10-CM

## 2016-02-28 DIAGNOSIS — R300 Dysuria: Secondary | ICD-10-CM | POA: Diagnosis not present

## 2016-02-28 DIAGNOSIS — R140 Abdominal distension (gaseous): Secondary | ICD-10-CM | POA: Insufficient documentation

## 2016-02-28 LAB — URINALYSIS, ROUTINE W REFLEX MICROSCOPIC
Bilirubin Urine: NEGATIVE
Glucose, UA: NEGATIVE mg/dL
KETONES UR: NEGATIVE mg/dL
Nitrite: NEGATIVE
PH: 6 (ref 5.0–8.0)
Protein, ur: NEGATIVE mg/dL
Specific Gravity, Urine: <1.005 — ABNORMAL LOW (ref 1.005–1.030)

## 2016-02-28 LAB — URINE MICROSCOPIC-ADD ON

## 2016-03-01 LAB — URINE CULTURE

## 2016-03-13 ENCOUNTER — Other Ambulatory Visit: Payer: Self-pay | Admitting: Urology

## 2016-03-13 ENCOUNTER — Ambulatory Visit (INDEPENDENT_AMBULATORY_CARE_PROVIDER_SITE_OTHER): Payer: BLUE CROSS/BLUE SHIELD | Admitting: Urology

## 2016-03-13 ENCOUNTER — Ambulatory Visit (HOSPITAL_COMMUNITY)
Admission: RE | Admit: 2016-03-13 | Discharge: 2016-03-13 | Disposition: A | Discharge status: Home / Self Care | Payer: BLUE CROSS/BLUE SHIELD | Source: Ambulatory Visit | Attending: Urology | Admitting: Urology

## 2016-03-13 DIAGNOSIS — N200 Calculus of kidney: Secondary | ICD-10-CM

## 2016-03-13 DIAGNOSIS — K5730 Diverticulosis of large intestine without perforation or abscess without bleeding: Secondary | ICD-10-CM | POA: Insufficient documentation

## 2016-03-13 DIAGNOSIS — D1803 Hemangioma of intra-abdominal structures: Secondary | ICD-10-CM | POA: Diagnosis not present

## 2016-03-13 DIAGNOSIS — I700 Atherosclerosis of aorta: Secondary | ICD-10-CM | POA: Diagnosis not present

## 2016-03-13 DIAGNOSIS — N201 Calculus of ureter: Secondary | ICD-10-CM | POA: Diagnosis not present

## 2016-03-13 DIAGNOSIS — K439 Ventral hernia without obstruction or gangrene: Secondary | ICD-10-CM | POA: Diagnosis not present

## 2016-03-13 NOTE — Progress Notes (Signed)
Called Dr McKenzie's office and let them know that the report was ready to be viewed.

## 2016-03-18 ENCOUNTER — Ambulatory Visit (INDEPENDENT_AMBULATORY_CARE_PROVIDER_SITE_OTHER): Payer: BLUE CROSS/BLUE SHIELD | Admitting: Urology

## 2016-03-18 DIAGNOSIS — N200 Calculus of kidney: Secondary | ICD-10-CM | POA: Diagnosis not present

## 2016-03-19 ENCOUNTER — Other Ambulatory Visit: Payer: Self-pay

## 2016-03-19 DIAGNOSIS — N200 Calculus of kidney: Secondary | ICD-10-CM

## 2016-04-01 ENCOUNTER — Other Ambulatory Visit (HOSPITAL_COMMUNITY): Payer: BLUE CROSS/BLUE SHIELD

## 2016-04-02 NOTE — Patient Instructions (Signed)
Bethany King  04/02/2016     @PREFPERIOPPHARMACY @   Your procedure is scheduled on  04/08/2016   Report to [facility] at  1145  A.M.  Call this number if you have problems the morning of surgery:  [phone]   Remember:  Do not eat food or drink liquids after midnight.  Take these medicines the morning of surgery with A SIP OF WATER  Xanax, claritin, zofran, oxycodone, imitrex.   Do not wear jewelry, make-up or nail polish.  Do not wear lotions, powders, or perfumes, or deoderant.  Do not shave 48 hours prior to surgery.  Men may shave face and neck.  Do not bring valuables to the hospital.  [facility] is not responsible for any belongings or valuables.  Contacts, dentures or bridgework may not be worn into surgery.  Leave your suitcase in the car.  After surgery it may be brought to your room.  For patients admitted to the hospital, discharge time will be determined by your treatment team.  Patients discharged the day of surgery will not be allowed to drive home.   Name and phone number of your driver:   family Special instructions:  none  Please read over the following fact sheets that you were given. Anesthesia Post-op Instructions and Care and Recovery After Surgery       Cystoscopy Cystoscopy is a procedure that is used to help your caregiver diagnose and sometimes treat conditions that affect your lower urinary tract. Your lower urinary tract includes your bladder and the tube through which urine passes from your bladder out of your body (urethra). Cystoscopy is performed with a thin, tube-shaped instrument (cystoscope). The cystoscope has lenses and a light at the end so that your caregiver can see inside your bladder. The cystoscope is inserted at the entrance of your urethra. Your caregiver guides it through your urethra and into your bladder. There are two main types of cystoscopy:  Flexible cystoscopy (with a flexible cystoscope).  Rigid  cystoscopy (with a rigid cystoscope). Cystoscopy may be recommended for many conditions, including:  Urinary tract infections.  Blood in your urine (hematuria).  Loss of bladder control (urinary incontinence) or overactive bladder.  Unusual cells found in a urine sample.  Urinary blockage.  Painful urination. Cystoscopy may also be done to remove a sample of your tissue to be checked under a microscope (biopsy). It may also be done to remove or destroy bladder stones. LET YOUR CAREGIVER KNOW ABOUT:  Allergies to food or medicine.  Medicines taken, including vitamins, herbs, eyedrops, over-the-counter medicines, and creams.  Use of steroids (by mouth or creams).  Previous problems with anesthetics or numbing medicines.  History of bleeding problems or blood clots.  Previous surgery.  Other health problems, including diabetes and kidney problems.  Possibility of pregnancy, if this applies. PROCEDURE The area around the opening to your urethra will be cleaned. A medicine to numb your urethra (local anesthetic) is used. If a tissue sample or stone is removed during the procedure, you may be given a medicine to make you sleep (general anesthetic). Your caregiver will gently insert the tip of the cystoscope into your urethra. The cystoscope will be slowly glided through your urethra and into your bladder. Sterile fluid will flow through the cystoscope and into your bladder. The fluid will expand and stretch your bladder. This gives your caregiver a better view of your bladder walls. The procedure lasts about 15-20 minutes.  AFTER THE PROCEDURE If a local anesthetic is used, you will be allowed to go home as soon as you are ready. If a general anesthetic is used, you will be taken to a recovery area until you are stable. You may have temporary bleeding and burning on urination.   This information is not intended to replace advice given to you by your health care provider. Make sure you  discuss any questions you have with your health care provider.   Document Released: 05/29/2000 Document Revised: 06/22/2014 Document Reviewed: 11/23/2011 Elsevier Interactive Patient Education 2016 Bethany King. Cystoscopy, Care After Refer to this sheet in the next few weeks. These instructions provide you with information on caring for yourself after your procedure. Your caregiver may also give you more specific instructions. Your treatment has been planned according to current medical practices, but problems sometimes occur. Call your caregiver if you have any problems or questions after your procedure. HOME CARE INSTRUCTIONS  Things you can do to ease any discomfort after your procedure include:  Drinking enough water and fluids to keep your urine clear or pale yellow.  Taking a warm bath to relieve any burning feelings. SEEK IMMEDIATE MEDICAL CARE IF:   You have an increase in blood in your urine.  You notice blood clots in your urine.  You have difficulty passing urine.  You have the chills.  You have abdominal pain.  You have a fever or persistent symptoms for more than 2-3 days.  You have a fever and your symptoms suddenly get worse. MAKE SURE YOU:   Understand these instructions.  Will watch your condition.  Will get help right away if you are not doing well or get worse.   This information is not intended to replace advice given to you by your health care provider. Make sure you discuss any questions you have with your health care provider.   Document Released: 12/19/2004 Document Revised: 06/22/2014 Document Reviewed: 11/23/2011 Elsevier Interactive Patient Education 2016 [employer] PATIENT INSTRUCTIONS POST-ANESTHESIA  IMMEDIATELY FOLLOWING SURGERY:  Do not drive or operate machinery for the first twenty four hours after surgery.  Do not make any important decisions for twenty four hours after surgery or while taking narcotic pain medications or sedatives.   If you develop intractable nausea and vomiting or a severe headache please notify your doctor immediately.  FOLLOW-UP:  Please make an appointment with your surgeon as instructed. You do not need to follow up with anesthesia unless specifically instructed to do so.  WOUND CARE INSTRUCTIONS (if applicable):  Keep a dry clean dressing on the anesthesia/puncture wound site if there is drainage.  Once the wound has quit draining you may leave it open to air.  Generally you should leave the bandage intact for twenty four hours unless there is drainage.  If the epidural site drains for more than 36-48 hours please call the anesthesia department.  QUESTIONS?:  Please feel free to call your physician or the hospital operator if you have any questions, and they will be happy to assist you.

## 2016-04-03 ENCOUNTER — Encounter (HOSPITAL_COMMUNITY)
Admission: RE | Admit: 2016-04-03 | Discharge: 2016-04-03 | Disposition: A | Discharge status: Home / Self Care | Payer: BLUE CROSS/BLUE SHIELD | Source: Ambulatory Visit | Attending: Urology | Admitting: Urology

## 2016-04-03 ENCOUNTER — Encounter (HOSPITAL_COMMUNITY): Payer: Self-pay

## 2016-04-03 DIAGNOSIS — Z01812 Encounter for preprocedural laboratory examination: Secondary | ICD-10-CM | POA: Diagnosis not present

## 2016-04-03 LAB — CBC WITH DIFFERENTIAL/PLATELET
Basophils Absolute: 0 10*3/uL (ref 0.0–0.1)
Basophils Relative: 0 %
EOS ABS: 0.1 10*3/uL (ref 0.0–0.7)
EOS PCT: 1 %
HCT: 38 % (ref 36.0–46.0)
HEMOGLOBIN: 12.3 g/dL (ref 12.0–15.0)
LYMPHS ABS: 2.2 10*3/uL (ref 0.7–4.0)
LYMPHS PCT: 26 %
MCH: 28.8 pg (ref 26.0–34.0)
MCHC: 32.4 g/dL (ref 30.0–36.0)
MCV: 89 fL (ref 78.0–100.0)
MONOS PCT: 5 %
Monocytes Absolute: 0.4 10*3/uL (ref 0.1–1.0)
Neutro Abs: 5.7 10*3/uL (ref 1.7–7.7)
Neutrophils Relative %: 68 %
PLATELETS: 295 10*3/uL (ref 150–400)
RBC: 4.27 MIL/uL (ref 3.87–5.11)
RDW: 13.6 % (ref 11.5–15.5)
WBC: 8.4 10*3/uL (ref 4.0–10.5)

## 2016-04-03 LAB — BASIC METABOLIC PANEL
Anion gap: 7 (ref 5–15)
BUN: 17 mg/dL (ref 6–20)
CO2: 26 mmol/L (ref 22–32)
Calcium: 9.1 mg/dL (ref 8.9–10.3)
Chloride: 103 mmol/L (ref 101–111)
Creatinine, Ser: 0.8 mg/dL (ref 0.44–1.00)
GFR calc Af Amer: >60 mL/min (ref >60)
GFR calc non Af Amer: >60 mL/min (ref >60)
Glucose, Bld: 118 mg/dL — ABNORMAL HIGH (ref 65–99)
Potassium: 3.1 mmol/L — ABNORMAL LOW (ref 3.5–5.1)
Sodium: 136 mmol/L (ref 135–145)

## 2016-04-03 LAB — HCG, SERUM, QUALITATIVE: PREG SERUM: NEGATIVE

## 2016-04-06 NOTE — Pre-Procedure Instructions (Signed)
Dr Patsey Berthold aware of K+-3.1. Order given to recheck potassium via I-stat am of surgery.

## 2016-04-08 ENCOUNTER — Ambulatory Visit (HOSPITAL_COMMUNITY)
Admission: RE | Admit: 2016-04-08 | Discharge: 2016-04-08 | Disposition: A | Discharge status: Home / Self Care | Payer: BLUE CROSS/BLUE SHIELD | Source: Ambulatory Visit | Attending: Urology | Admitting: Urology

## 2016-04-08 ENCOUNTER — Encounter (HOSPITAL_COMMUNITY)
Admission: RE | Disposition: A | Discharge status: Home / Self Care | Payer: Self-pay | Source: Ambulatory Visit | Attending: Urology

## 2016-04-08 ENCOUNTER — Ambulatory Visit (HOSPITAL_COMMUNITY): Payer: BLUE CROSS/BLUE SHIELD | Admitting: Anesthesiology

## 2016-04-08 ENCOUNTER — Ambulatory Visit (HOSPITAL_COMMUNITY): Payer: BLUE CROSS/BLUE SHIELD

## 2016-04-08 ENCOUNTER — Encounter (HOSPITAL_COMMUNITY): Payer: Self-pay | Admitting: *Deleted

## 2016-04-08 DIAGNOSIS — K589 Irritable bowel syndrome without diarrhea: Secondary | ICD-10-CM | POA: Insufficient documentation

## 2016-04-08 DIAGNOSIS — Z981 Arthrodesis status: Secondary | ICD-10-CM | POA: Diagnosis not present

## 2016-04-08 DIAGNOSIS — F419 Anxiety disorder, unspecified: Secondary | ICD-10-CM | POA: Diagnosis not present

## 2016-04-08 DIAGNOSIS — Z882 Allergy status to sulfonamides status: Secondary | ICD-10-CM | POA: Insufficient documentation

## 2016-04-08 DIAGNOSIS — F329 Major depressive disorder, single episode, unspecified: Secondary | ICD-10-CM | POA: Diagnosis not present

## 2016-04-08 DIAGNOSIS — Z79891 Long term (current) use of opiate analgesic: Secondary | ICD-10-CM | POA: Insufficient documentation

## 2016-04-08 DIAGNOSIS — Z885 Allergy status to narcotic agent status: Secondary | ICD-10-CM | POA: Insufficient documentation

## 2016-04-08 DIAGNOSIS — Z888 Allergy status to other drugs, medicaments and biological substances status: Secondary | ICD-10-CM | POA: Insufficient documentation

## 2016-04-08 DIAGNOSIS — N200 Calculus of kidney: Secondary | ICD-10-CM | POA: Diagnosis not present

## 2016-04-08 DIAGNOSIS — Z79899 Other long term (current) drug therapy: Secondary | ICD-10-CM | POA: Diagnosis not present

## 2016-04-08 DIAGNOSIS — J309 Allergic rhinitis, unspecified: Secondary | ICD-10-CM | POA: Insufficient documentation

## 2016-04-08 DIAGNOSIS — G43909 Migraine, unspecified, not intractable, without status migrainosus: Secondary | ICD-10-CM | POA: Diagnosis not present

## 2016-04-08 DIAGNOSIS — D1803 Hemangioma of intra-abdominal structures: Secondary | ICD-10-CM | POA: Insufficient documentation

## 2016-04-08 DIAGNOSIS — Q615 Medullary cystic kidney: Secondary | ICD-10-CM | POA: Diagnosis not present

## 2016-04-08 DIAGNOSIS — M5136 Other intervertebral disc degeneration, lumbar region: Secondary | ICD-10-CM | POA: Insufficient documentation

## 2016-04-08 DIAGNOSIS — Z87442 Personal history of urinary calculi: Secondary | ICD-10-CM | POA: Diagnosis not present

## 2016-04-08 DIAGNOSIS — M1990 Unspecified osteoarthritis, unspecified site: Secondary | ICD-10-CM | POA: Insufficient documentation

## 2016-04-08 HISTORY — PX: CYSTOSCOPY W/ URETERAL STENT PLACEMENT: SHX1429

## 2016-04-08 HISTORY — PX: CYSTOSCOPY WITH HOLMIUM LASER LITHOTRIPSY: SHX6639

## 2016-04-08 LAB — POCT I-STAT 4, (NA,K, GLUC, HGB,HCT)
GLUCOSE: 85 mg/dL (ref 65–99)
HEMATOCRIT: 36 % (ref 36.0–46.0)
HEMOGLOBIN: 12.2 g/dL (ref 12.0–15.0)
Potassium: 3.1 mmol/L — ABNORMAL LOW (ref 3.5–5.1)
Sodium: 139 mmol/L (ref 135–145)

## 2016-04-08 SURGERY — CYSTOSCOPY, WITH RETROGRADE PYELOGRAM AND URETERAL STENT INSERTION
Anesthesia: General | Laterality: Right

## 2016-04-08 MED ORDER — LIDOCAINE HCL (CARDIAC) 10 MG/ML IV SOLN
INTRAVENOUS | Status: DC | PRN
  Administered 2016-04-08: 30 mg via INTRAVENOUS

## 2016-04-08 MED ORDER — CHLORHEXIDINE GLUCONATE CLOTH 2 % EX PADS: 6.0000 | MEDICATED_PAD | Freq: Once | CUTANEOUS | Status: DC

## 2016-04-08 MED ORDER — FENTANYL CITRATE (PF) 100 MCG/2ML IJ SOLN
INTRAMUSCULAR | Status: AC
  Filled 2016-04-08: qty 2

## 2016-04-08 MED ORDER — OXYCODONE-ACETAMINOPHEN 5-325 MG PO TABS: 1.0000 | ORAL_TABLET | Freq: Four times a day (QID) | ORAL | 0 refills | Status: DC | PRN

## 2016-04-08 MED ORDER — FENTANYL CITRATE (PF) 100 MCG/2ML IJ SOLN
INTRAMUSCULAR | Status: DC | PRN
  Administered 2016-04-08: 25 ug via INTRAVENOUS
  Administered 2016-04-08 (×2): 50 ug via INTRAVENOUS
  Administered 2016-04-08: 25 ug via INTRAVENOUS

## 2016-04-08 MED ORDER — HYDROMORPHONE HCL 1 MG/ML IJ SOLN: 0.2500 mg | INTRAMUSCULAR | Status: DC | PRN

## 2016-04-08 MED ORDER — LACTATED RINGERS IV SOLN
INTRAVENOUS | Status: DC
  Administered 2016-04-08 (×2): via INTRAVENOUS

## 2016-04-08 MED ORDER — PROMETHAZINE HCL 25 MG/ML IJ SOLN
INTRAMUSCULAR | Status: AC
Start: 2016-04-08 — End: 2016-04-08
  Filled 2016-04-08: qty 1

## 2016-04-08 MED ORDER — SCOPOLAMINE 1 MG/3DAYS TD PT72
MEDICATED_PATCH | TRANSDERMAL | Status: AC
  Filled 2016-04-08: qty 1

## 2016-04-08 MED ORDER — DEXAMETHASONE SODIUM PHOSPHATE 4 MG/ML IJ SOLN
4.0000 mg | INTRAMUSCULAR | Status: AC
  Administered 2016-04-08: 4 mg via INTRAVENOUS

## 2016-04-08 MED ORDER — FENTANYL CITRATE (PF) 100 MCG/2ML IJ SOLN
25.0000 ug | INTRAMUSCULAR | Status: DC | PRN
  Administered 2016-04-08: 25 ug via INTRAVENOUS

## 2016-04-08 MED ORDER — PROPOFOL 10 MG/ML IV BOLUS
INTRAVENOUS | Status: AC
  Filled 2016-04-08: qty 20

## 2016-04-08 MED ORDER — FENTANYL CITRATE (PF) 250 MCG/5ML IJ SOLN
INTRAMUSCULAR | Status: AC
  Filled 2016-04-08: qty 5

## 2016-04-08 MED ORDER — ONDANSETRON HCL 4 MG/2ML IJ SOLN
4.0000 mg | Freq: Once | INTRAMUSCULAR | Status: AC
  Administered 2016-04-08: 4 mg via INTRAVENOUS

## 2016-04-08 MED ORDER — ONDANSETRON HCL 4 MG/2ML IJ SOLN
INTRAMUSCULAR | Status: AC
  Filled 2016-04-08: qty 2

## 2016-04-08 MED ORDER — LIDOCAINE HCL (PF) 1 % IJ SOLN
INTRAMUSCULAR | Status: AC
  Filled 2016-04-08: qty 5

## 2016-04-08 MED ORDER — MIDAZOLAM HCL 2 MG/2ML IJ SOLN
1.0000 mg | INTRAMUSCULAR | Status: DC | PRN
  Administered 2016-04-08: 2 mg via INTRAVENOUS

## 2016-04-08 MED ORDER — SCOPOLAMINE 1 MG/3DAYS TD PT72
1.0000 | MEDICATED_PATCH | Freq: Once | TRANSDERMAL | Status: DC
  Administered 2016-04-08: 1.5 mg via TRANSDERMAL

## 2016-04-08 MED ORDER — PROPOFOL 10 MG/ML IV BOLUS
INTRAVENOUS | Status: DC | PRN
  Administered 2016-04-08: 190 mg via INTRAVENOUS

## 2016-04-08 MED ORDER — DIATRIZOATE MEGLUMINE 30 % UR SOLN
URETHRAL | Status: AC
  Filled 2016-04-08: qty 300

## 2016-04-08 MED ORDER — SODIUM CHLORIDE 0.9% FLUSH
INTRAVENOUS | Status: AC
  Filled 2016-04-08: qty 10

## 2016-04-08 MED ORDER — ONDANSETRON 4 MG PO TBDP: 4.0000 mg | ORAL_TABLET | Freq: Three times a day (TID) | ORAL | 0 refills | Status: DC | PRN

## 2016-04-08 MED ORDER — SODIUM CHLORIDE 0.9 % IR SOLN
Status: DC | PRN
  Administered 2016-04-08 (×2): 3000 mL

## 2016-04-08 MED ORDER — PROMETHAZINE HCL 25 MG/ML IJ SOLN
6.2500 mg | Freq: Once | INTRAMUSCULAR | Status: AC
  Administered 2016-04-08: 6.25 mg via INTRAVENOUS

## 2016-04-08 MED ORDER — DEXTROSE 5 % IV SOLN: 2.0000 g | Freq: Once | INTRAVENOUS | Status: DC

## 2016-04-08 MED ORDER — MIDAZOLAM HCL 2 MG/2ML IJ SOLN
INTRAMUSCULAR | Status: AC
  Filled 2016-04-08: qty 2

## 2016-04-08 MED ORDER — DEXAMETHASONE SODIUM PHOSPHATE 4 MG/ML IJ SOLN
INTRAMUSCULAR | Status: AC
  Filled 2016-04-08: qty 1

## 2016-04-08 MED ORDER — CEFAZOLIN IN D5W 1 GM/50ML IV SOLN
1.0000 g | INTRAVENOUS | Status: AC
  Administered 2016-04-08: 1 g via INTRAVENOUS
  Filled 2016-04-08: qty 50

## 2016-04-08 SURGICAL SUPPLY — 26 items
BAG DRAIN URO TABLE W/ADPT NS (DRAPE) ×3 IMPLANT
BAG DRN 8 ADPR NS SKTRN CSTL (DRAPE) ×2
BAG HAMPER (MISCELLANEOUS) ×3 IMPLANT
CATH INTERMIT  6FR 70CM (CATHETERS) ×1 IMPLANT
CLOTH BEACON ORANGE TIMEOUT ST (SAFETY) ×3 IMPLANT
EXTRACTOR STONE NITINOL NGAGE (UROLOGICAL SUPPLIES) ×1 IMPLANT
FIBER LASER FLEXIVA 200 (UROLOGICAL SUPPLIES) ×1 IMPLANT
GLOVE BIO SURGEON STRL SZ8 (GLOVE) ×3 IMPLANT
GLOVE BIOGEL PI IND STRL 6.5 (GLOVE) IMPLANT
GLOVE BIOGEL PI IND STRL 7.0 (GLOVE) IMPLANT
GLOVE BIOGEL PI INDICATOR 6.5 (GLOVE) ×1
GLOVE BIOGEL PI INDICATOR 7.0 (GLOVE) ×2
GLOVE ECLIPSE 6.5 STRL STRAW (GLOVE) ×1 IMPLANT
GOWN STRL REUS W/ TWL XL LVL3 (GOWN DISPOSABLE) ×2 IMPLANT
GOWN STRL REUS W/TWL LRG LVL3 (GOWN DISPOSABLE) ×3 IMPLANT
GOWN STRL REUS W/TWL XL LVL3 (GOWN DISPOSABLE) ×3
GUIDEWIRE STR DUAL SENSOR (WIRE) ×1 IMPLANT
GUIDEWIRE STR ZIPWIRE 035X150 (MISCELLANEOUS) ×3 IMPLANT
IV NS IRRIG 3000ML ARTHROMATIC (IV SOLUTION) ×4 IMPLANT
KIT ROOM TURNOVER AP CYSTO (KITS) ×3 IMPLANT
MANIFOLD NEPTUNE II (INSTRUMENTS) ×1 IMPLANT
PACK CYSTO (CUSTOM PROCEDURE TRAY) ×3 IMPLANT
PAD ARMBOARD 7.5X6 YLW CONV (MISCELLANEOUS) ×3 IMPLANT
SHEATH ACCESS URETERAL 38CM (SHEATH) ×1 IMPLANT
STENT URET 6FRX24 CONTOUR (STENTS) ×2 IMPLANT
SYRINGE 10CC LL (SYRINGE) ×3 IMPLANT

## 2016-04-08 NOTE — H&P (Signed)
Urology Admission H&P  Chief Complaint: nephrolithiasis  History of Present Illness: Ms Delillo is a 45yo wityh a hx of bilateral nephrolithiasis here for stone extraction.  Past Medical History:  Diagnosis Date  . Allergic rhinitis   . Anxiety   . Arthritis   . Bladder pain   . Complication of anesthesia   . Costochondritis    hx  . DDD (degenerative disc disease), lumbar   . Depression   . GERD (gastroesophageal reflux disease)   . Hepatic hemangioma 2006   stable  . History of adenomatous polyp of colon   . History of colitis   . History of gastritis   . History of kidney stones   . HTN (hypertension)   . Irritable bowel syndrome (IBS)   . Medullary sponge kidney    left  . Migraine   . Nephrolithiasis    bilateral-- nonobstructive per CT  . PONV (postoperative nausea and vomiting)   . Urgency of urination    Past Surgical History:  Procedure Laterality Date  . ANTERIOR CERVICAL DECOMP/DISCECTOMY FUSION  02/26/2014   C5 - C6; fusion with plate  . CARPAL TUNNEL RELEASE Bilateral right 04-01-2010/  left 05-30-2010  . CHOLECYSTECTOMY N/A 07/14/2013   Procedure: LAPAROSCOPIC CHOLECYSTECTOMY;  Surgeon: Jamesetta So, MD;  Location: [facility];  Service: General;  Laterality: N/A;  . COLONOSCOPY  10-13-2010  . CYSTO WITH HYDRODISTENSION N/A 04/02/2015   Procedure: CYSTOSCOPY/HYDRODISTENSION, BLADDER BIOPSY WITH FULGURATION;  Surgeon: Irine Seal, MD;  Location[facility];  Service: Urology;  Laterality: N/A;  . DILITATION & CURRETTAGE/HYSTROSCOPY WITH THERMACHOICE ABLATION N/A 09/07/2012   Procedure: DILATATION & CURETTAGE/HYSTEROSCOPY WITH THERMACHOICE ABLATION;  Surgeon: Florian Buff, MD;  Location: [facility];  Service: Gynecology;  Laterality: N/A;  . ESOPHAGOGASTRODUODENOSCOPY  10/14/10  . EXTRACORPOREAL SHOCK WAVE LITHOTRIPSY  multiple  . PERCUTANEOUS NEPHROSTOLITHOTOMY  2003  . PLANTAR FASCIA SURGERY Bilateral right 2008//  left 2010    Home Medications:   Prescriptions Prior to Admission  Medication Sig Dispense Refill Last Dose  . ALPRAZolam (XANAX) 1 MG tablet Take 1 mg by mouth 3 (three) times daily as needed for anxiety.   04/08/2016 at 1000  . conjugated estrogens (PREMARIN) vaginal cream 1 applicator at bedtime (Patient taking differently: Place 1 Applicatorful vaginally at bedtime. 1 applicator at bedtime) 30 g 12 04/07/2016 at 2300  . fluticasone (FLONASE) 50 MCG/ACT nasal spray Place 1 spray into both nostrils daily as needed for allergies.    Past Week at Unknown time  . hydrochlorothiazide (HYDRODIURIL) 25 MG tablet Take 25 mg by mouth every morning.    04/07/2016 at 1000  . loratadine (CLARITIN) 10 MG tablet Take 10 mg by mouth daily.   04/07/2016 at 1000  . ondansetron (ZOFRAN-ODT) 4 MG disintegrating tablet Take 4 mg by mouth every 8 (eight) hours as needed (for nausea associated with kidney stones.).   0 04/08/2016 at 1000  . oxyCODONE-acetaminophen (PERCOCET/ROXICET) 5-325 MG tablet Take 1 tablet by mouth every 6 (six) hours as needed (for pain.).    04/07/2016 at 2000  . potassium chloride SA (K-DUR,KLOR-CON) 20 MEQ tablet Take 40 mEq by mouth every morning.   04/07/2016 at 1000  . Probiotic Product (HEALTHY COLON) CAPS Take 1 capsule by mouth daily.     04/07/2016 at 1000  . SUMAtriptan (IMITREX) 100 MG tablet Take 100 mg by mouth every 2 (two) hours as needed for migraine.   10 Past Month at Unknown time  . Vilazodone  HCl (VIIBRYD) 40 MG TABS Take 40 mg by mouth every morning.    04/08/2016 at 1000   Allergies:  Allergies  Allergen Reactions  . Hydromorphone Itching  . Prednisone Other (See Comments)    Increase anxiety symptoms, insomnia  . Tramadol Hcl Other (See Comments)    dizziness  . Sulfa Antibiotics Rash    Family History  Problem Relation Age of Onset  . Irritable bowel syndrome Mother   . Scleroderma Mother   . Rheum arthritis Mother   . Asthma Mother   . Melanoma Father   . Cancer Father     Melanoma   . Colon cancer Cousin 4    second cousin Sunday [place])  . Cancer Maternal Grandmother     Ovarian  . Diabetes Maternal Grandmother   . Thyroid disease Maternal Aunt    Social History:  reports that she has never smoked. She has never used smokeless tobacco. She reports that she does not drink alcohol or use drugs.  Review of Systems  Genitourinary: Positive for flank pain.  All other systems reviewed and are negative.   Physical Exam:  Vital signs in last 24 hours: Temp:  [98.1 F (36.7 C)] 98.1 F (36.7 C) (10/25 1208) Pulse Rate:  [65] 65 (10/25 1208) Resp:  [18] 18 (10/25 1208) BP: (127)/(76) 127/76 (10/25 1208) SpO2:  [96 %] 96 % (10/25 1208) Weight:  [80.7 kg (178 lb)] 80.7 kg (178 lb) (10/25 1208) Physical Exam  Constitutional: She is oriented to person, place, and time. She appears well-developed and well-nourished.  HENT:  Head: Normocephalic and atraumatic.  Eyes: EOM are normal. Pupils are equal, round, and reactive to light.  Neck: Normal range of motion. No thyromegaly present.  Cardiovascular: Normal rate and regular rhythm.   Respiratory: Effort normal. No respiratory distress.  GI: Soft. She exhibits no distension.  Musculoskeletal: Normal range of motion. She exhibits no edema.  Neurological: She is alert and oriented to person, place, and time.  Skin: Skin is warm and dry.  Psychiatric: She has a normal mood and affect. Her behavior is normal. Judgment and thought content normal.    Laboratory Data:  Results for orders placed or performed during the hospital encounter of 04/08/16 (from the past 24 hour(s))  I-STAT 4, (NA,K, GLUC, HGB,HCT)     Status: Abnormal   Collection Time: 04/08/16 12:22 PM  Result Value Ref Range   Sodium 139 135 - 145 mmol/L   Potassium 3.1 (L) 3.5 - 5.1 mmol/L   Glucose, Bld 85 65 - 99 mg/dL   HCT 36.0 36.0 - 46.0 %   Hemoglobin 12.2 12.0 - 15.0 g/dL   No results found for this or any previous visit (from the past 240  hour(s)). Creatinine:  Recent Labs  04/03/16 1307  CREATININE 0.80   Baseline Creatinine: 0.8  Impression/Assessment:  45yo with bilateral renal calculi  Plan:  The risks/benefits/alternatives to right ureteroscopic stone extraction and bilateral stent placement was explained to the patient and she understands and wishes to proceed with surgery  Nicolette Bang 04/08/2016, 12:42 PM

## 2016-04-08 NOTE — Anesthesia Procedure Notes (Signed)
Procedure Name: LMA Insertion Date/Time: 04/08/2016 2:19 PM Performed by: Vista Deck Pre-anesthesia Checklist: Patient identified, Patient being monitored, Emergency Drugs available, Timeout performed and Suction available Patient Re-evaluated:Patient Re-evaluated prior to inductionOxygen Delivery Method: Circle System Utilized Preoxygenation: Pre-oxygenation with 100% oxygen Intubation Type: IV induction Ventilation: Mask ventilation without difficulty LMA: LMA inserted LMA Size: 4.0 Number of attempts: 1 Placement Confirmation: positive ETCO2 and breath sounds checked- equal and bilateral Comments: Inserted by K. Daniel Immunologist

## 2016-04-08 NOTE — Transfer of Care (Signed)
Immediate Anesthesia Transfer of Care Note  Patient: Bethany King  Procedure(s) Performed: Procedure(s): CYSTOSCOPY WITH BILATERAL RETROGRADE PYELOGRAM, BILATERAL URETERAL STENT PLACEMENT (Bilateral) CYSTOSCOPY WITH RIGHT RENAL STONE EXTRACTION WITH HOLMIUM LASER LITHOTRIPSY (Right)  Patient Location: PACU  Anesthesia Type:General  Level of Consciousness: awake and alert   Airway & Oxygen Therapy: Patient Spontanous Breathing and non-rebreather face mask  Post-op Assessment: Report given to RN and Post -op Vital signs reviewed and stable  Post vital signs: Reviewed and stable  Last Vitals:  Vitals:   04/08/16 1208 04/08/16 1245  BP: 127/76   Pulse: 65   Resp: 18 18  Temp: 36.7 C     Last Pain:  Vitals:   04/08/16 1208  TempSrc: Oral  PainSc: 3       Patients Stated Pain Goal: 6 ([id number])  Complications: No apparent anesthesia complications

## 2016-04-08 NOTE — Discharge Instructions (Addendum)
Cystoscopy, Care After Refer to this sheet in the next few weeks. These instructions provide you with information on caring for yourself after your procedure. Your caregiver may also give you more specific instructions. Your treatment has been planned according to current medical practices, but problems sometimes occur. Call your caregiver if you have any problems or questions after your procedure. HOME CARE INSTRUCTIONS  Things you can do to ease any discomfort after your procedure include:  Drinking enough water and fluids to keep your urine clear or pale yellow.  Taking a warm bath to relieve any burning feelings. SEEK IMMEDIATE MEDICAL CARE IF:   You have an increase in blood in your urine.  You notice blood clots in your urine.  You have difficulty passing urine.  You have the chills.  You have abdominal pain.    Ureteral Stent Implantation, Care After Refer to this sheet in the next few weeks. These instructions provide you with information on caring for yourself after your procedure. Your health care provider may also give you more specific instructions. Your treatment has been planned according to current medical practices, but problems sometimes occur. Call your health care provider if you have any problems or questions after your procedure. WHAT TO EXPECT AFTER THE PROCEDURE You should be back to normal activity within 48 hours after the procedure. Nausea and vomiting may occur and are commonly the result of anesthesia. It is common to experience sharp pain in the back or lower abdomen and penis with voiding. This is caused by movement of the ends of the stent with the act of urinating.It usually goes away within minutes after you have stopped urinating. HOME CARE INSTRUCTIONS Make sure to drink plenty of fluids. You may have small amounts of bleeding, causing your urine to be red. This is normal. Certain movements may trigger pain or a feeling that you need to urinate. You may  be given medicines to prevent infection or bladder spasms. Be sure to take all medicines as directed. Only take over-the-counter or prescription medicines for pain, discomfort, or fever as directed by your health care provider. Do not take aspirin, as this can make bleeding worse. Your stent will be left in until the blockage is resolved. This may take 2 weeks or longer, depending on the reason for stent implantation. You may have an X-ray exam to make sure your ureter is open and that the stent has not moved out of position (migrated). The stent can be removed by your health care provider in the office. Medicines may be given for comfort while the stent is being removed. Be sure to keep all follow-up appointments so your health care provider can check that you are healing properly. SEEK MEDICAL CARE IF:  You experience increasing pain.  Your pain medicine is not working. SEEK IMMEDIATE MEDICAL CARE IF:  Your urine is dark red or has blood clots.  You are leaking urine (incontinent).  You have a fever, chills, feeling sick to your stomach (nausea), or vomiting.  Your pain is not relieved by pain medicine.  The end of the stent comes out of the urethra.  You are unable to urinate.   This information is not intended to replace advice given to you by your health care provider. Make sure you discuss any questions you have with your health care provider.   Document Released: 02/01/2013 Document Revised: 06/06/2013 Document Reviewed: 12/14/2014 Elsevier Interactive Patient Education 2016 [place] have a fever or persistent symptoms  for more than 2-3 days.  You have a fever and your symptoms suddenly get worse. MAKE SURE YOU:   Understand these instructions.  Will watch your condition.  Will get help right away if you are not doing well or get worse.   This information is not intended to replace advice given to you by your health care provider. Make sure you discuss any  questions you have with your health care provider.   Document Released: 12/19/2004 Document Revised: 06/22/2014 Document Reviewed: 11/23/2011 Elsevier Interactive Patient Education [employer].

## 2016-04-08 NOTE — Anesthesia Preprocedure Evaluation (Signed)
Anesthesia Evaluation  Patient identified by MRN, date of birth, ID band Patient awake    Reviewed: Allergy & Precautions, H&P , NPO status , Patient's Chart, lab work & pertinent test results  History of Anesthesia Complications (+) PONV and history of anesthetic complications  Airway Mallampati: I  TM Distance: >3 FB     Dental  (+) Teeth Intact, Implants, Dental Advisory Given   Pulmonary neg pulmonary ROS,    breath sounds clear to auscultation       Cardiovascular hypertension, Pt. on medications  Rhythm:Regular Rate:Normal     Neuro/Psych  Headaches, PSYCHIATRIC DISORDERS Anxiety Depression  Neuromuscular disease    GI/Hepatic GERD  Medicated and Controlled,  Endo/Other    Renal/GU Renal disease     Musculoskeletal   Abdominal   Peds  Hematology   Anesthesia Other Findings   Reproductive/Obstetrics                             Anesthesia Physical Anesthesia Plan  ASA: II  Anesthesia Plan: General   Post-op Pain Management:    Induction: Intravenous  Airway Management Planned: LMA  Additional Equipment:   Intra-op Plan:   Post-operative Plan: Extubation in OR  Informed Consent: I have reviewed the patients History and Physical, chart, labs and discussed the procedure including the risks, benefits and alternatives for the proposed anesthesia with the patient or authorized representative who has indicated his/her understanding and acceptance.     Plan Discussed with:   Anesthesia Plan Comments:         Anesthesia Quick Evaluation

## 2016-04-08 NOTE — Patient Instructions (Addendum)
    Marcayla A Finger  04/08/2016      [id number] - [place], [place] [address] Phone: [phone] Fax: [phone]  [facility] [place], [place] [address] [place] [address] Phone: [phone] Fax: [place], [place] [address] Phone: [phone] Fax: [phone]    Your procedure is scheduled on   04/15/2016   Report to [facility] at  1150  A.M.  Call this number if you have problems the morning of surgery:  [phone]   Remember:  Do not eat food or drink liquids after midnight.  Take these medicines the morning of surgery with A SIP OF WATER  Xanax, claritin, zofran, oxycodone, viibryd.   Do not wear jewelry, make-up or nail polish.  Do not wear lotions, powders, or perfumes, or deoderant.  Do not shave 48 hours prior to surgery.  Men may shave face and neck.  Do not bring valuables to the hospital.  [facility] is not responsible for any belongings or valuables.  Contacts, dentures or bridgework may not be worn into surgery.  Leave your suitcase in the car.  After surgery it may be brought to your room.  For patients admitted to the hospital, discharge time will be determined by your treatment team.  Patients discharged the day of surgery will not be allowed to drive home.   Name and phone number of your driver:   family Special instructions:  none  Please read over the following fact sheets that you were given. Anesthesia Post-op Instructions and Care and Recovery After Surgery

## 2016-04-08 NOTE — Brief Op Note (Signed)
04/08/2016  2:23 PM  PATIENT:  Bethany King  45 y.o. female  PRE-OPERATIVE DIAGNOSIS:  bilateral renal calculi  POST-OPERATIVE DIAGNOSIS:  bilateral renal calculi  PROCEDURE:  Procedure(s): CYSTOSCOPY WITH BILATERAL RETROGRADE PYELOGRAM, BILATERAL URETERAL STENT PLACEMENT (Bilateral) CYSTOSCOPY WITH RIGHT RENAL STONE EXTRACTION WITH HOLMIUM LASER LITHOTRIPSY (Right)  SURGEON:  Surgeon(s) and Role:    * Cleon Gustin, MD - Primary  PHYSICIAN ASSISTANT:   ASSISTANTS: none   ANESTHESIA:   general  EBL:  Total I/O In: 800 [I.V.:800] Out: 0   BLOOD ADMINISTERED:none  DRAINS: bilateral 6x24 JJ ureteral stents   LOCAL MEDICATIONS USED:  NONE  SPECIMEN:  Source of Specimen:  right renal calculi  DISPOSITION OF SPECIMEN:  PATHOLOGY  COUNTS:  YES  TOURNIQUET:  * No tourniquets in log *  DICTATION: .Note written in EPIC  PLAN OF CARE: Discharge to home after PACU  PATIENT DISPOSITION:  PACU - hemodynamically stable.   Delay start of Pharmacological VTE agent (>24hrs) due to surgical blood loss or risk of bleeding: not applicable

## 2016-04-09 ENCOUNTER — Encounter (HOSPITAL_COMMUNITY)
Admission: RE | Admit: 2016-04-09 | Discharge: 2016-04-09 | Disposition: A | Discharge status: Home / Self Care | Payer: BLUE CROSS/BLUE SHIELD | Source: Ambulatory Visit | Attending: Urology | Admitting: Urology

## 2016-04-09 DIAGNOSIS — Z01812 Encounter for preprocedural laboratory examination: Secondary | ICD-10-CM | POA: Insufficient documentation

## 2016-04-09 NOTE — Anesthesia Postprocedure Evaluation (Signed)
Anesthesia Post Note  Patient: Bethany King  Procedure(s) Performed: Procedure(s) (LRB): CYSTOSCOPY WITH BILATERAL RETROGRADE PYELOGRAM, BILATERAL URETERAL STENT PLACEMENT (Bilateral) CYSTOSCOPY WITH RIGHT RENAL STONE EXTRACTION WITH HOLMIUM LASER LITHOTRIPSY (Right)  Patient location during evaluation: PACU Anesthesia Type: General Level of consciousness: awake and alert Pain management: satisfactory to patient Vital Signs Assessment: post-procedure vital signs reviewed and stable Respiratory status: spontaneous breathing Cardiovascular status: stable Anesthetic complications: no    Last Vitals:  Vitals:   04/08/16 1530 04/08/16 1545  BP: 125/70 (!) 141/80  Pulse: (!) 56   Resp: 19 18  Temp:  36.3 C    Last Pain:  Vitals:   04/08/16 1545  TempSrc: Oral  PainSc: 2                  Casimer Russett

## 2016-04-14 ENCOUNTER — Encounter (HOSPITAL_COMMUNITY): Payer: Self-pay | Admitting: Urology

## 2016-04-15 ENCOUNTER — Encounter (HOSPITAL_COMMUNITY): Payer: Self-pay

## 2016-04-15 ENCOUNTER — Ambulatory Visit (HOSPITAL_COMMUNITY): Payer: BLUE CROSS/BLUE SHIELD | Admitting: Anesthesiology

## 2016-04-15 ENCOUNTER — Ambulatory Visit (HOSPITAL_COMMUNITY)
Admission: RE | Admit: 2016-04-15 | Discharge: 2016-04-15 | Disposition: A | Discharge status: Home / Self Care | Payer: BLUE CROSS/BLUE SHIELD | Source: Ambulatory Visit | Attending: Urology | Admitting: Urology

## 2016-04-15 ENCOUNTER — Encounter (HOSPITAL_COMMUNITY)
Admission: RE | Disposition: A | Discharge status: Home / Self Care | Payer: Self-pay | Source: Ambulatory Visit | Attending: Urology

## 2016-04-15 ENCOUNTER — Ambulatory Visit (HOSPITAL_COMMUNITY): Payer: BLUE CROSS/BLUE SHIELD

## 2016-04-15 DIAGNOSIS — N200 Calculus of kidney: Secondary | ICD-10-CM | POA: Diagnosis not present

## 2016-04-15 DIAGNOSIS — Z79899 Other long term (current) drug therapy: Secondary | ICD-10-CM | POA: Insufficient documentation

## 2016-04-15 DIAGNOSIS — Z87442 Personal history of urinary calculi: Secondary | ICD-10-CM | POA: Insufficient documentation

## 2016-04-15 DIAGNOSIS — J309 Allergic rhinitis, unspecified: Secondary | ICD-10-CM | POA: Insufficient documentation

## 2016-04-15 DIAGNOSIS — K219 Gastro-esophageal reflux disease without esophagitis: Secondary | ICD-10-CM | POA: Insufficient documentation

## 2016-04-15 DIAGNOSIS — Z7951 Long term (current) use of inhaled steroids: Secondary | ICD-10-CM | POA: Diagnosis not present

## 2016-04-15 DIAGNOSIS — I10 Essential (primary) hypertension: Secondary | ICD-10-CM | POA: Insufficient documentation

## 2016-04-15 DIAGNOSIS — Q615 Medullary cystic kidney: Secondary | ICD-10-CM | POA: Insufficient documentation

## 2016-04-15 DIAGNOSIS — N209 Urinary calculus, unspecified: Secondary | ICD-10-CM

## 2016-04-15 DIAGNOSIS — F419 Anxiety disorder, unspecified: Secondary | ICD-10-CM | POA: Insufficient documentation

## 2016-04-15 HISTORY — PX: CYSTOSCOPY WITH HOLMIUM LASER LITHOTRIPSY: SHX6639

## 2016-04-15 HISTORY — PX: CYSTOSCOPY W/ URETERAL STENT PLACEMENT: SHX1429

## 2016-04-15 HISTORY — PX: STONE EXTRACTION WITH BASKET: SHX5318

## 2016-04-15 LAB — POCT I-STAT 4, (NA,K, GLUC, HGB,HCT)
Glucose, Bld: 84 mg/dL (ref 65–99)
HCT: 37 % (ref 36.0–46.0)
HEMOGLOBIN: 12.6 g/dL (ref 12.0–15.0)
Potassium: 3.7 mmol/L (ref 3.5–5.1)
Sodium: 140 mmol/L (ref 135–145)

## 2016-04-15 SURGERY — CYSTOSCOPY, WITH RETROGRADE PYELOGRAM AND URETERAL STENT INSERTION
Anesthesia: General | Laterality: Left

## 2016-04-15 MED ORDER — DEXAMETHASONE SODIUM PHOSPHATE 4 MG/ML IJ SOLN
4.0000 mg | Freq: Once | INTRAMUSCULAR | Status: AC
  Administered 2016-04-15: 4 mg via INTRAVENOUS

## 2016-04-15 MED ORDER — DIATRIZOATE MEGLUMINE 30 % UR SOLN
URETHRAL | Status: DC | PRN
  Administered 2016-04-15: 100 mL via URETHRAL

## 2016-04-15 MED ORDER — STERILE WATER FOR IRRIGATION IR SOLN
Status: DC | PRN
  Administered 2016-04-15: 1000 mL

## 2016-04-15 MED ORDER — DEXTROSE 5 % IV SOLN
INTRAVENOUS | Status: DC | PRN
  Administered 2016-04-15: 1 g via INTRAVENOUS

## 2016-04-15 MED ORDER — FENTANYL CITRATE (PF) 100 MCG/2ML IJ SOLN
INTRAMUSCULAR | Status: DC | PRN
  Administered 2016-04-15 (×2): 50 ug via INTRAVENOUS

## 2016-04-15 MED ORDER — LIDOCAINE HCL (PF) 1 % IJ SOLN
INTRAMUSCULAR | Status: AC
  Filled 2016-04-15: qty 5

## 2016-04-15 MED ORDER — OXYCODONE-ACETAMINOPHEN 10-325 MG PO TABS: 1.0000 | ORAL_TABLET | ORAL | 0 refills | Status: DC | PRN

## 2016-04-15 MED ORDER — DEXTROSE 5 % IV SOLN
INTRAVENOUS | Status: AC
  Filled 2016-04-15: qty 10

## 2016-04-15 MED ORDER — CEPHALEXIN 250 MG PO CAPS: 250.0000 mg | ORAL_CAPSULE | Freq: Two times a day (BID) | ORAL | 0 refills | Status: DC

## 2016-04-15 MED ORDER — PROPOFOL 10 MG/ML IV BOLUS
INTRAVENOUS | Status: DC | PRN
  Administered 2016-04-15: 200 mg via INTRAVENOUS

## 2016-04-15 MED ORDER — DEXAMETHASONE SODIUM PHOSPHATE 4 MG/ML IJ SOLN
INTRAMUSCULAR | Status: AC
  Filled 2016-04-15: qty 1

## 2016-04-15 MED ORDER — FENTANYL CITRATE (PF) 100 MCG/2ML IJ SOLN
INTRAMUSCULAR | Status: AC
  Filled 2016-04-15: qty 2

## 2016-04-15 MED ORDER — SCOPOLAMINE 1 MG/3DAYS TD PT72
1.0000 | MEDICATED_PATCH | Freq: Once | TRANSDERMAL | Status: DC
  Administered 2016-04-15: 1.5 mg via TRANSDERMAL
  Filled 2016-04-15: qty 1

## 2016-04-15 MED ORDER — ONDANSETRON HCL 4 MG/2ML IJ SOLN
4.0000 mg | Freq: Once | INTRAMUSCULAR | Status: AC
  Administered 2016-04-15: 4 mg via INTRAVENOUS
  Filled 2016-04-15: qty 2

## 2016-04-15 MED ORDER — LIDOCAINE HCL (CARDIAC) 10 MG/ML IV SOLN
INTRAVENOUS | Status: DC | PRN
  Administered 2016-04-15: 40 mg via INTRAVENOUS

## 2016-04-15 MED ORDER — SODIUM CHLORIDE 0.9 % IR SOLN
Status: DC | PRN
  Administered 2016-04-15 (×2): 3000 mL

## 2016-04-15 MED ORDER — LACTATED RINGERS IV SOLN
INTRAVENOUS | Status: DC
  Administered 2016-04-15 (×2): via INTRAVENOUS

## 2016-04-15 MED ORDER — MIDAZOLAM HCL 2 MG/2ML IJ SOLN
1.0000 mg | INTRAMUSCULAR | Status: DC | PRN
  Administered 2016-04-15 (×2): 2 mg via INTRAVENOUS
  Filled 2016-04-15 (×2): qty 2

## 2016-04-15 SURGICAL SUPPLY — 24 items
ACCESS SHEATH ×1 IMPLANT
BAG DRAIN URO TABLE W/ADPT NS (DRAPE) ×2 IMPLANT
BAG DRN 8 ADPR NS SKTRN CSTL (DRAPE) ×1
BAG HAMPER (MISCELLANEOUS) ×2 IMPLANT
CATH INTERMIT  6FR 70CM (CATHETERS) ×2 IMPLANT
CLOTH BEACON ORANGE TIMEOUT ST (SAFETY) ×2 IMPLANT
DECANTER SPIKE VIAL GLASS SM (MISCELLANEOUS) ×2 IMPLANT
EXTRACTOR STONE NITINOL NGAGE (UROLOGICAL SUPPLIES) ×1 IMPLANT
FIBER LASER FLEXIVA 365 (UROLOGICAL SUPPLIES) IMPLANT
GLOVE BIO SURGEON STRL SZ8 (GLOVE) ×2 IMPLANT
GOWN STRL REUS W/ TWL XL LVL3 (GOWN DISPOSABLE) ×1 IMPLANT
GOWN STRL REUS W/TWL LRG LVL3 (GOWN DISPOSABLE) ×2 IMPLANT
GOWN STRL REUS W/TWL XL LVL3 (GOWN DISPOSABLE) ×2
GUIDEWIRE STR DUAL SENSOR (WIRE) ×2 IMPLANT
GUIDEWIRE STR ZIPWIRE 035X150 (MISCELLANEOUS) ×2 IMPLANT
IV NS IRRIG 3000ML ARTHROMATIC (IV SOLUTION) ×2 IMPLANT
KIT ROOM TURNOVER AP CYSTO (KITS) ×2 IMPLANT
LASER FIBER DISP (UROLOGICAL SUPPLIES) IMPLANT
MANIFOLD NEPTUNE II (INSTRUMENTS) ×2 IMPLANT
PACK CYSTO (CUSTOM PROCEDURE TRAY) ×2 IMPLANT
PAD ARMBOARD 7.5X6 YLW CONV (MISCELLANEOUS) ×2 IMPLANT
STENT URET 6FRX26 CONTOUR (STENTS) ×1 IMPLANT
SYRINGE 10CC LL (SYRINGE) ×2 IMPLANT
TOWEL OR 17X26 4PK STRL BLUE (TOWEL DISPOSABLE) ×2 IMPLANT

## 2016-04-15 NOTE — Anesthesia Postprocedure Evaluation (Signed)
Anesthesia Post Note  Patient: Bethany King  Procedure(s) Performed: Procedure(s) (LRB): CYSTOSCOPY WITH RETROGRADE PYELOGRAM/URETERAL STENT PLACEMENT (Left) CYSTOSCOPY WITH HOLMIUM LASER LITHOTRIPSY (Left) STONE EXTRACTION WITH BASKET (Left)  Patient location during evaluation: PACU Anesthesia Type: General Level of consciousness: awake and alert Pain management: pain level controlled Vital Signs Assessment: post-procedure vital signs reviewed and stable Respiratory status: spontaneous breathing Cardiovascular status: blood pressure returned to baseline Postop Assessment: no signs of nausea or vomiting Anesthetic complications: no    Last Vitals:  Vitals:   04/15/16 1330 04/15/16 1335  BP:    Resp: (!) 27 16  Temp:      Last Pain:  Vitals:   04/15/16 1305  PainSc: 5                  Viha Kriegel

## 2016-04-15 NOTE — H&P (View-Only) (Signed)
Urology Admission H&P  Chief Complaint: nephrolithiasis  History of Present Illness: Bethany King is a 45yo wityh a hx of bilateral nephrolithiasis here for stone extraction.  Past Medical History:  Diagnosis Date  . Allergic rhinitis   . Anxiety   . Arthritis   . Bladder pain   . Complication of anesthesia   . Costochondritis    hx  . DDD (degenerative disc disease), lumbar   . Depression   . GERD (gastroesophageal reflux disease)   . Hepatic hemangioma 2006   stable  . History of adenomatous polyp of colon   . History of colitis   . History of gastritis   . History of kidney stones   . HTN (hypertension)   . Irritable bowel syndrome (IBS)   . Medullary sponge kidney    left  . Migraine   . Nephrolithiasis    bilateral-- nonobstructive per CT  . PONV (postoperative nausea and vomiting)   . Urgency of urination    Past Surgical History:  Procedure Laterality Date  . ANTERIOR CERVICAL DECOMP/DISCECTOMY FUSION  02/26/2014   C5 - C6; fusion with plate  . CARPAL TUNNEL RELEASE Bilateral right 04-01-2010/  left 05-30-2010  . CHOLECYSTECTOMY N/A 07/14/2013   Procedure: LAPAROSCOPIC CHOLECYSTECTOMY;  Surgeon: Jamesetta So, MD;  Location: [facility];  Service: General;  Laterality: N/A;  . COLONOSCOPY  10-13-2010  . CYSTO WITH HYDRODISTENSION N/A 04/02/2015   Procedure: CYSTOSCOPY/HYDRODISTENSION, BLADDER BIOPSY WITH FULGURATION;  Surgeon: Irine Seal, MD;  Location: [facility];  Service: Urology;  Laterality: N/A;  . DILITATION & CURRETTAGE/HYSTROSCOPY WITH THERMACHOICE ABLATION N/A 09/07/2012   Procedure: DILATATION & CURETTAGE/HYSTEROSCOPY WITH THERMACHOICE ABLATION;  Surgeon: Florian Buff, MD;  Location: [facility];  Service: Gynecology;  Laterality: N/A;  . ESOPHAGOGASTRODUODENOSCOPY  10/14/10  . EXTRACORPOREAL SHOCK WAVE LITHOTRIPSY  multiple  . PERCUTANEOUS NEPHROSTOLITHOTOMY  2003  . PLANTAR FASCIA SURGERY Bilateral right 2008//  left 2010    Home Medications:   Prescriptions Prior to Admission  Medication Sig Dispense Refill Last Dose  . ALPRAZolam (XANAX) 1 MG tablet Take 1 mg by mouth 3 (three) times daily as needed for anxiety.   04/08/2016 at 1000  . conjugated estrogens (PREMARIN) vaginal cream 1 applicator at bedtime (Patient taking differently: Place 1 Applicatorful vaginally at bedtime. 1 applicator at bedtime) 30 g 12 04/07/2016 at 2300  . fluticasone (FLONASE) 50 MCG/ACT nasal spray Place 1 spray into both nostrils daily as needed for allergies.    Past Week at Unknown time  . hydrochlorothiazide (HYDRODIURIL) 25 MG tablet Take 25 mg by mouth every morning.    04/07/2016 at 1000  . loratadine (CLARITIN) 10 MG tablet Take 10 mg by mouth daily.   04/07/2016 at 1000  . ondansetron (ZOFRAN-ODT) 4 MG disintegrating tablet Take 4 mg by mouth every 8 (eight) hours as needed (for nausea associated with kidney stones.).   0 04/08/2016 at 1000  . oxyCODONE-acetaminophen (PERCOCET/ROXICET) 5-325 MG tablet Take 1 tablet by mouth every 6 (six) hours as needed (for pain.).    04/07/2016 at 2000  . potassium chloride SA (K-DUR,KLOR-CON) 20 MEQ tablet Take 40 mEq by mouth every morning.   04/07/2016 at 1000  . Probiotic Product (HEALTHY COLON) CAPS Take 1 capsule by mouth daily.     04/07/2016 at 1000  . SUMAtriptan (IMITREX) 100 MG tablet Take 100 mg by mouth every 2 (two) hours as needed for migraine.   10 Past Month at Unknown time  . Vilazodone  HCl (VIIBRYD) 40 MG TABS Take 40 mg by mouth every morning.    04/08/2016 at 1000   Allergies:  Allergies  Allergen Reactions  . Hydromorphone Itching  . Prednisone Other (See Comments)    Increase anxiety symptoms, insomnia  . Tramadol Hcl Other (See Comments)    dizziness  . Sulfa Antibiotics Rash    Family History  Problem Relation Age of Onset  . Irritable bowel syndrome Mother   . Scleroderma Mother   . Rheum arthritis Mother   . Asthma Mother   . Melanoma Father   . Cancer Father     Melanoma   . Colon cancer Cousin 62    second cousin Sunday Spillers [place])  . Cancer Maternal Grandmother     Ovarian  . Diabetes Maternal Grandmother   . Thyroid disease Maternal Aunt    Social History:  reports that she has never smoked. She has never used smokeless tobacco. She reports that she does not drink alcohol or use drugs.  Review of Systems  Genitourinary: Positive for flank pain.  All other systems reviewed and are negative.   Physical Exam:  Vital signs in last 24 hours: Temp:  [98.1 F (36.7 C)] 98.1 F (36.7 C) (10/25 1208) Pulse Rate:  [65] 65 (10/25 1208) Resp:  [18] 18 (10/25 1208) BP: (127)/(76) 127/76 (10/25 1208) SpO2:  [96 %] 96 % (10/25 1208) Weight:  [80.7 kg (178 lb)] 80.7 kg (178 lb) (10/25 1208) Physical Exam  Constitutional: She is oriented to person, place, and time. She appears well-developed and well-nourished.  HENT:  Head: Normocephalic and atraumatic.  Eyes: EOM are normal. Pupils are equal, round, and reactive to light.  Neck: Normal range of motion. No thyromegaly present.  Cardiovascular: Normal rate and regular rhythm.   Respiratory: Effort normal. No respiratory distress.  GI: Soft. She exhibits no distension.  Musculoskeletal: Normal range of motion. She exhibits no edema.  Neurological: She is alert and oriented to person, place, and time.  Skin: Skin is warm and dry.  Psychiatric: She has a normal mood and affect. Her behavior is normal. Judgment and thought content normal.    Laboratory Data:  Results for orders placed or performed during the hospital encounter of 04/08/16 (from the past 24 hour(s))  I-STAT 4, (NA,K, GLUC, HGB,HCT)     Status: Abnormal   Collection Time: 04/08/16 12:22 PM  Result Value Ref Range   Sodium 139 135 - 145 mmol/L   Potassium 3.1 (L) 3.5 - 5.1 mmol/L   Glucose, Bld 85 65 - 99 mg/dL   HCT 36.0 36.0 - 46.0 %   Hemoglobin 12.2 12.0 - 15.0 g/dL   No results found for this or any previous visit (from the past 240  hour(s)). Creatinine:  Recent Labs  04/03/16 1307  CREATININE 0.80   Baseline Creatinine: 0.8  Impression/Assessment:  45yo with bilateral renal calculi  Plan:  The risks/benefits/alternatives to right ureteroscopic stone extraction and bilateral stent placement was explained to the patient and she understands and wishes to proceed with surgery  Nicolette Bang 04/08/2016, 12:42 PM

## 2016-04-15 NOTE — Anesthesia Preprocedure Evaluation (Signed)
Anesthesia Evaluation  Patient identified by MRN, date of birth, ID band Patient awake    Reviewed: Allergy & Precautions, H&P , NPO status , Patient's Chart, lab work & pertinent test results  History of Anesthesia Complications (+) PONV and history of anesthetic complications  Airway Mallampati: I  TM Distance: >3 FB     Dental  (+) Teeth Intact, Implants, Dental Advisory Given   Pulmonary neg pulmonary ROS,    breath sounds clear to auscultation       Cardiovascular hypertension, Pt. on medications  Rhythm:Regular Rate:Normal     Neuro/Psych  Headaches, PSYCHIATRIC DISORDERS Anxiety Depression  Neuromuscular disease    GI/Hepatic GERD  Medicated and Controlled,  Endo/Other    Renal/GU Renal disease     Musculoskeletal   Abdominal   Peds  Hematology   Anesthesia Other Findings   Reproductive/Obstetrics                             Anesthesia Physical Anesthesia Plan  ASA: II  Anesthesia Plan: General   Post-op Pain Management:    Induction: Intravenous  Airway Management Planned: LMA  Additional Equipment:   Intra-op Plan:   Post-operative Plan: Extubation in OR  Informed Consent: I have reviewed the patients History and Physical, chart, labs and discussed the procedure including the risks, benefits and alternatives for the proposed anesthesia with the patient or authorized representative who has indicated his/her understanding and acceptance.     Plan Discussed with:   Anesthesia Plan Comments:         Anesthesia Quick Evaluation

## 2016-04-15 NOTE — Transfer of Care (Signed)
Immediate Anesthesia Transfer of Care Note  Patient: Bethany King  Procedure(s) Performed: Procedure(s): CYSTOSCOPY WITH RETROGRADE PYELOGRAM/URETERAL STENT PLACEMENT (Left) CYSTOSCOPY WITH HOLMIUM LASER LITHOTRIPSY (Left) STONE EXTRACTION WITH BASKET (Left)  Patient Location: PACU  Anesthesia Type:General  Level of Consciousness: awake and alert   Airway & Oxygen Therapy: Patient Spontanous Breathing and Patient connected to face mask oxygen  Post-op Assessment: Report given to RN  Post vital signs: Reviewed and stable  Last Vitals:  Vitals:   04/15/16 1330 04/15/16 1335  BP:    Resp: (!) 27 16  Temp:      Last Pain:  Vitals:   04/15/16 1305  PainSc: 5       Patients Stated Pain Goal: 5 ([id number])  Complications: No apparent anesthesia complications

## 2016-04-15 NOTE — Discharge Instructions (Signed)
General Anesthesia, Adult, Care After Refer to this sheet in the next few weeks. These instructions provide you with information on caring for yourself after your procedure. Your health care provider may also give you more specific instructions. Your treatment has been planned according to current medical practices, but problems sometimes occur. Call your health care provider if you have any problems or questions after your procedure. WHAT TO EXPECT AFTER THE PROCEDURE After the procedure, it is typical to experience:  Sleepiness.  Nausea and vomiting. HOME CARE INSTRUCTIONS  For the first 24 hours after general anesthesia:  Have a responsible person with you.  Do not drive a car. If you are alone, do not take public transportation.  Do not drink alcohol.  Do not take medicine that has not been prescribed by your health care provider.  Do not sign important papers or make important decisions.  You may resume a normal diet and activities as directed by your health care provider.  Change bandages (dressings) as directed.  If you have questions or problems that seem related to general anesthesia, call the hospital and ask for the anesthetist or anesthesiologist on call. SEEK MEDICAL CARE IF:  You have nausea and vomiting that continue the day after anesthesia.  You develop a rash. SEEK IMMEDIATE MEDICAL CARE IF:   You have difficulty breathing.  You have chest pain.  You have any allergic problems.   This information is not intended to replace advice given to you by your health care provider. Make sure you discuss any questions you have with your health care provider.   Document Released: 09/07/2000 Document Revised: 06/22/2014 Document Reviewed: 09/30/2011 Elsevier Interactive Patient Education 2016 [place]. Cystoscopy, Care After Refer to this sheet in the next few weeks. These instructions provide you with information on caring for yourself after your procedure.  Your caregiver may also give you more specific instructions. Your treatment has been planned according to current medical practices, but problems sometimes occur. Call your caregiver if you have any problems or questions after your procedure. HOME CARE INSTRUCTIONS  Things you can do to ease any discomfort after your procedure include:  Drinking enough water and fluids to keep your urine clear or pale yellow.  Taking a warm bath to relieve any burning feelings. SEEK IMMEDIATE MEDICAL CARE IF:   You have an increase in blood in your urine.  You notice blood clots in your urine.  You have difficulty passing urine.  You have the chills.  You have abdominal pain.  You have a fever or persistent symptoms for more than 2-3 days.  You have a fever and your symptoms suddenly get worse. MAKE SURE YOU:   Understand these instructions.  Will watch your condition.  Will get help right away if you are not doing well or get worse.   This information is not intended to replace advice given to you by your health care provider. Make sure you discuss any questions you have with your health care provider.   Document Released: 12/19/2004 Document Revised: 06/22/2014 Document Reviewed: 11/23/2011 Elsevier Interactive Patient Education [employer].

## 2016-04-15 NOTE — Brief Op Note (Signed)
04/15/2016  2:51 PM  PATIENT:  Bethany King  45 y.o. female  PRE-OPERATIVE DIAGNOSIS:  left renal calculi  POST-OPERATIVE DIAGNOSIS:  left renal calculi  PROCEDURE:  Procedure(s): CYSTOSCOPY WITH RETROGRADE PYELOGRAM/URETERAL STENT PLACEMENT (Left) CYSTOSCOPY WITH HOLMIUM LASER LITHOTRIPSY (Left) STONE EXTRACTION WITH BASKET (Left)  SURGEON:  Surgeon(s) and Role:    * Cleon Gustin, MD - Primary  PHYSICIAN ASSISTANT:   ASSISTANTS: none   ANESTHESIA:   general  EBL:  Total I/O In: 900 [I.V.:900] Out: 20 [Blood:20]  BLOOD ADMINISTERED:none  DRAINS: left 6x24 JJ ureteral stent with tether   LOCAL MEDICATIONS USED:  NONE  SPECIMEN:  Source of Specimen:  left renal calculus  DISPOSITION OF SPECIMEN:  PATHOLOGY  COUNTS:  YES  TOURNIQUET:  * No tourniquets in log *  DICTATION: .Note written in EPIC  PLAN OF CARE: Discharge to home after PACU  PATIENT DISPOSITION:  PACU - hemodynamically stable.   Delay start of Pharmacological VTE agent (>24hrs) due to surgical blood loss or risk of bleeding: not applicable

## 2016-04-15 NOTE — Interval H&P Note (Signed)
History and Physical Interval Note:  04/15/2016 1:18 PM  Bethany King  has presented today for surgery, with the diagnosis of bilateral renal calculi  The various methods of treatment have been discussed with the patient and family. After consideration of risks, benefits and other options for treatment, the patient has consented to  Procedure(s): CYSTOSCOPY WITH RETROGRADE PYELOGRAM/URETERAL STENT PLACEMENT (Bilateral) CYSTOSCOPY WITH HOLMIUM LASER LITHOTRIPSY (Bilateral) STONE EXTRACTION WITH BASKET (Bilateral) as a surgical intervention .  The patient's history has been reviewed, patient examined, no change in status, stable for surgery.  I have reviewed the patient's chart and labs.  Questions were answered to the patient's satisfaction.     Nicolette Bang

## 2016-04-15 NOTE — Op Note (Signed)
Preoperative diagnosis: Right renal stone  Postoperative diagnosis: Same  Procedure: 1 cystoscopy 2. Bilateral retrograde pyelography 3.  Intraoperative fluoroscopy, under one hour, with interpretation 4.  Right ureteroscopic stone manipulation with laser lithotripsy 5.  bilateral 6 x 24 JJ stent placement  Attending: Rosie Fate  Anesthesia: General  Estimated blood loss: None  Drains: bilateral 6 x 24 JJ ureteral stent without tether  Specimens: right renal stone for analysis  Antibiotics: ancef  Findings: Right lower pole calculi stone. No hydronephrosis b ilaterally. No masses/lesions in the bladder. Ureteral orifices in normal anatomic location.  Indications: Patient is a 45 year old female with a history of bilateral renal stones and who has persistent right flank pain.  After discussing treatment options, she decided proceed with right ureteroscopic stone manipulation and bilateral stent placement.  Procedure her in detail: The patient was brought to the operating room and a brief timeout was done to ensure correct patient, correct procedure, correct site.  General anesthesia was administered patient was placed in dorsal lithotomy position.  Her genitalia was then prepped and draped in usual sterile fashion.  A rigid 93 French cystoscope was passed in the urethra and the bladder.  Bladder was inspected free masses or lesions.  the  ureteral orifices were in the normal orthotopic locations.  a 6 french ureteral catheter was then instilled into the right ureter orifice.  a gentle retrograde was obtained and findings noted above.  we then placed a zip wire through the ureteral catheter and advanced up to the renal pelvis.  we then removed the cystoscope and cannulated the right ureteral orifice with a semirigid ureteroscope.  No stone was found in the ureter. Once we reached the UPJ a sensor wire was advanced in to the renal pelvis. We then removed the ureteroscope and advanced a  flexible ureteroscope over the sensor wire. We encountered the stone in the lower pole.  using using a 200 nm laser fiber and fragmented the stone into smaller pieces.  the pieces were then removed with a Ngage basket.  Once all stone fragments were removed we then placed a 6 x 24 double-j ureteral stent over the original zip wire. We then removed the wire and good coil was noted in the the renal pelvis under fluoroscopy and the bladder under direct vision.  We then turned our attention to the left side.   a 6 french ureteral catheter was then instilled into the left ureter orifice.  a gentle retrograde was obtained and findings noted above.  we then placed a zip wire through the ureteral catheter and advanced up to the renal pelvis. we then placed a 6 x 24 double-j ureteral stent over the zip wire. We then removed the wire and good coil was noted in the the renal pelvis under fluoroscopy and the bladder under direct vision.     the bladder was then drained and this concluded the procedure which was well tolerated by patient.  Complications: None  Condition: Stable, extubated, transferred to PACU  Plan: Patient is to be discharged home as to follow-up in 2 weeks for left ureteroscopic stone extraction.

## 2016-04-15 NOTE — Anesthesia Procedure Notes (Signed)
Procedure Name: LMA Insertion Date/Time: 04/15/2016 1:48 PM Performed by: Tressie Stalker E Pre-anesthesia Checklist: Patient identified, Patient being monitored, Emergency Drugs available, Timeout performed and Suction available Patient Re-evaluated:Patient Re-evaluated prior to inductionOxygen Delivery Method: Circle System Utilized Preoxygenation: Pre-oxygenation with 100% oxygen Intubation Type: IV induction Ventilation: Mask ventilation without difficulty LMA: LMA inserted LMA Size: 4.0 Number of attempts: 1 Placement Confirmation: positive ETCO2 and breath sounds checked- equal and bilateral

## 2016-04-20 ENCOUNTER — Encounter (HOSPITAL_COMMUNITY): Payer: Self-pay | Admitting: Urology

## 2016-04-20 LAB — STONE ANALYSIS
CA OXALATE, MONOHYDR.: 80 %
CA PHOS CRY STONE QL IR: 10 %
Ca Oxalate,Dihydrate: 10 %
Stone Weight KSTONE: 50 mg

## 2016-04-22 ENCOUNTER — Ambulatory Visit (INDEPENDENT_AMBULATORY_CARE_PROVIDER_SITE_OTHER): Payer: BLUE CROSS/BLUE SHIELD | Admitting: Urology

## 2016-04-22 DIAGNOSIS — N200 Calculus of kidney: Secondary | ICD-10-CM

## 2016-04-28 ENCOUNTER — Encounter: Payer: Self-pay | Admitting: Obstetrics & Gynecology

## 2016-04-28 ENCOUNTER — Ambulatory Visit (INDEPENDENT_AMBULATORY_CARE_PROVIDER_SITE_OTHER): Payer: BLUE CROSS/BLUE SHIELD | Admitting: Obstetrics & Gynecology

## 2016-04-28 VITALS — BP 122/88 | HR 76 | Ht 63.0 in | Wt 181.0 lb

## 2016-04-28 DIAGNOSIS — N952 Postmenopausal atrophic vaginitis: Secondary | ICD-10-CM | POA: Diagnosis not present

## 2016-04-28 DIAGNOSIS — N72 Inflammatory disease of cervix uteri: Secondary | ICD-10-CM | POA: Diagnosis not present

## 2016-04-28 LAB — STONE ANALYSIS
Ca Oxalate,Dihydrate: 25 %
Ca Oxalate,Monohydr.: 55 %
Ca phos cry stone ql IR: 20 %
STONE WEIGHT KSTONE: 109 mg

## 2016-04-28 NOTE — Progress Notes (Signed)
Chief Complaint  Patient presents with  . Follow-up    Blood pressure 122/88, pulse 76, height 5\' 3"  (1.6 m), weight 181 lb (82.1 kg), last menstrual period 03/25/2016.  45 y.o. G0P0 Patient's last menstrual period was 03/25/2016. The current method of family planning is post menopausal status.  Outpatient Encounter Prescriptions as of 04/28/2016  Medication Sig Note  . ALPRAZolam (XANAX) 1 MG tablet Take 1 mg by mouth 3 (three) times daily as needed for anxiety.   . conjugated estrogens (PREMARIN) vaginal cream 1 applicator at bedtime (Patient taking differently: Place 1 Applicatorful vaginally at bedtime. 1 applicator at bedtime)   . hydrochlorothiazide (HYDRODIURIL) 25 MG tablet Take 25 mg by mouth every morning.    . loratadine (CLARITIN) 10 MG tablet Take 10 mg by mouth daily.   . potassium chloride SA (K-DUR,KLOR-CON) 20 MEQ tablet Take 40 mEq by mouth every morning. 04/15/2016: Took 60 mEq  . Probiotic Product (HEALTHY COLON) CAPS Take 1 capsule by mouth daily.     . SUMAtriptan (IMITREX) 100 MG tablet Take 100 mg by mouth every 2 (two) hours as needed for migraine.  04/01/2016: Takes sparingly  . Vilazodone HCl (VIIBRYD) 40 MG TABS Take 40 mg by mouth every morning.    . [DISCONTINUED] cephALEXin (KEFLEX) 250 MG capsule Take 1 capsule (250 mg total) by mouth 2 (two) times daily. (Patient not taking: Reported on 04/28/2016)   . [DISCONTINUED] fluticasone (FLONASE) 50 MCG/ACT nasal spray Place 1 spray into both nostrils daily as needed for allergies.    . [DISCONTINUED] ondansetron (ZOFRAN-ODT) 4 MG disintegrating tablet Take 1 tablet (4 mg total) by mouth every 8 (eight) hours as needed (for nausea associated with kidney stones.). (Patient not taking: Reported on 04/28/2016)   . [DISCONTINUED] oxyCODONE-acetaminophen (PERCOCET) 10-325 MG tablet Take 1 tablet by mouth every 4 (four) hours as needed for pain. (Patient not taking: Reported on 04/28/2016)    No  facility-administered encounter medications on file as of 04/28/2016.     Subjective Pt had a 3-4 month problem with vaginal microenvironment Has re equilibated her environment with oral antibiotics and maintained it with vaginal estrogen Doing well now no complaints in the past 3 months or so  Objective   Pertinent ROS   Labs or studies     Impression Diagnoses this Encounter::   ICD-9-CM ICD-10-CM   1. Post-menopausal atrophic vaginitis 627.3 N95.2   2. Cervicitis 616.0 N72     Established relevant diagnosis(es):   Plan/Recommendations: No orders of the defined types were placed in this encounter.   Labs or Scans Ordered: No orders of the defined types were placed in this encounter.   Management:: Continue nightly or every other night vaginal estrogen  Follow up Return in about 1 year (around 04/28/2017) for yearly, with Dr Elonda Husky.        Face to face time:  10 minutes  Greater than 50% of the visit time was spent in counseling and coordination of care with the patient.  The summary and outline of the counseling and care coordination is summarized in the note above.   All questions were answered.  Past Medical History:  Diagnosis Date  . Allergic rhinitis   . Anxiety   . Arthritis   . Bladder pain   . Complication of anesthesia   . Costochondritis    hx  . DDD (degenerative disc disease), lumbar   . Depression   . GERD (gastroesophageal reflux disease)   .  Hepatic hemangioma 2006   stable  . History of adenomatous polyp of colon   . History of colitis   . History of gastritis   . History of kidney stones   . HTN (hypertension)   . Irritable bowel syndrome (IBS)   . Medullary sponge kidney    left  . Migraine   . Nephrolithiasis    bilateral-- nonobstructive per CT  . PONV (postoperative nausea and vomiting)   . Spinal stenosis   . Urgency of urination     Past Surgical History:  Procedure Laterality Date  . ANTERIOR CERVICAL  DECOMP/DISCECTOMY FUSION  02/26/2014   C5 - C6; fusion with plate  . BACK SURGERY    . CARPAL TUNNEL RELEASE Bilateral right 04-01-2010/  left 05-30-2010  . CHOLECYSTECTOMY N/A 07/14/2013   Procedure: LAPAROSCOPIC CHOLECYSTECTOMY;  Surgeon: Jamesetta So, MD;  Location: [facility];  Service: General;  Laterality: N/A;  . COLONOSCOPY  10-13-2010  . CYSTO WITH HYDRODISTENSION N/A 04/02/2015   Procedure: CYSTOSCOPY/HYDRODISTENSION, BLADDER BIOPSY WITH FULGURATION;  Surgeon: Irine Seal, MD;  Location: [facility];  Service: Urology;  Laterality: N/A;  . CYSTOSCOPY W/ URETERAL STENT PLACEMENT Bilateral 04/08/2016   Procedure: CYSTOSCOPY WITH BILATERAL RETROGRADE PYELOGRAM, BILATERAL URETERAL STENT PLACEMENT;  Surgeon: Cleon Gustin, MD;  Location: [facility];  Service: Urology;  Laterality: Bilateral;  . CYSTOSCOPY W/ URETERAL STENT PLACEMENT Left 04/15/2016   Procedure: CYSTOSCOPY WITH RETROGRADE PYELOGRAM/URETERAL STENT PLACEMENT;  Surgeon: Cleon Gustin, MD;  Location: [facility];  Service: Urology;  Laterality: Left;  . CYSTOSCOPY WITH HOLMIUM LASER LITHOTRIPSY Right 04/08/2016   Procedure: CYSTOSCOPY WITH RIGHT RENAL STONE EXTRACTION WITH HOLMIUM LASER LITHOTRIPSY;  Surgeon: Cleon Gustin, MD;  Location: [facility];  Service: Urology;  Laterality: Right;  . CYSTOSCOPY WITH HOLMIUM LASER LITHOTRIPSY Left 04/15/2016   Procedure: CYSTOSCOPY WITH HOLMIUM LASER LITHOTRIPSY;  Surgeon: Cleon Gustin, MD;  Location: [facility];  Service: Urology;  Laterality: Left;  . DIAGNOSTIC LAPAROSCOPY    . DILATION AND CURETTAGE OF UTERUS    . DILITATION & CURRETTAGE/HYSTROSCOPY WITH THERMACHOICE ABLATION N/A 09/07/2012   Procedure: DILATATION & CURETTAGE/HYSTEROSCOPY WITH THERMACHOICE ABLATION;  Surgeon: Florian Buff, MD;  Location: [facility];  Service: Gynecology;  Laterality: N/A;  . ESOPHAGOGASTRODUODENOSCOPY  10/14/10  . EXTRACORPOREAL SHOCK WAVE LITHOTRIPSY  multiple  . PERCUTANEOUS  NEPHROSTOLITHOTOMY  2003  . PLANTAR FASCIA SURGERY Bilateral right 2008//  left 2010  . STONE EXTRACTION WITH BASKET Left 04/15/2016   Procedure: STONE EXTRACTION WITH BASKET;  Surgeon: Cleon Gustin, MD;  Location: [facility];  Service: Urology;  Laterality: Left;    OB History    Gravida Para Term Preterm AB Living   0             SAB TAB Ectopic Multiple Live Births                  Allergies  Allergen Reactions  . Hydromorphone Itching  . Prednisone Other (See Comments)    Increase anxiety symptoms, insomnia  . Tramadol Hcl Other (See Comments)    dizziness  . Sulfa Antibiotics Rash    Social History   Social History  . Marital status: Divorced    Spouse name: N/A  . Number of children: 0  . Years of education: N/A   Occupational History  . ORDER PROCESSER [employer]   Social History Main Topics  . Smoking status: Never Smoker  . Smokeless tobacco: Never Used  .  Alcohol use No  . Drug use: No  . Sexual activity: Not Currently    Birth control/ protection: None   Other Topics Concern  . Not on file   Social History Narrative  . No narrative on file    Family History  Problem Relation Age of Onset  . Irritable bowel syndrome Mother   . Scleroderma Mother   . Rheum arthritis Mother   . Asthma Mother   . Melanoma Father   . Cancer Father     Melanoma  . Colon cancer Cousin 27    second cousin Sunday Spillers [place])  . Cancer Maternal Grandmother     Ovarian  . Diabetes Maternal Grandmother   . Thyroid disease Maternal Aunt

## 2016-05-14 ENCOUNTER — Other Ambulatory Visit: Payer: Self-pay | Admitting: Urology

## 2016-05-14 DIAGNOSIS — N200 Calculus of kidney: Secondary | ICD-10-CM

## 2016-05-20 ENCOUNTER — Other Ambulatory Visit: Payer: Self-pay | Admitting: Obstetrics & Gynecology

## 2016-05-20 ENCOUNTER — Ambulatory Visit (HOSPITAL_COMMUNITY)
Admission: RE | Admit: 2016-05-20 | Discharge: 2016-05-20 | Disposition: A | Discharge status: Home / Self Care | Payer: BLUE CROSS/BLUE SHIELD | Source: Ambulatory Visit | Attending: Urology | Admitting: Urology

## 2016-05-20 DIAGNOSIS — N200 Calculus of kidney: Secondary | ICD-10-CM

## 2016-05-20 DIAGNOSIS — Z1231 Encounter for screening mammogram for malignant neoplasm of breast: Secondary | ICD-10-CM

## 2016-06-04 ENCOUNTER — Ambulatory Visit (HOSPITAL_COMMUNITY)
Admission: RE | Admit: 2016-06-04 | Discharge: 2016-06-04 | Disposition: A | Discharge status: Home / Self Care | Payer: BLUE CROSS/BLUE SHIELD | Source: Ambulatory Visit | Attending: Obstetrics & Gynecology | Admitting: Obstetrics & Gynecology

## 2016-06-04 DIAGNOSIS — Z1231 Encounter for screening mammogram for malignant neoplasm of breast: Secondary | ICD-10-CM

## 2016-06-10 ENCOUNTER — Ambulatory Visit (INDEPENDENT_AMBULATORY_CARE_PROVIDER_SITE_OTHER): Payer: BLUE CROSS/BLUE SHIELD | Admitting: Urology

## 2016-06-10 DIAGNOSIS — N200 Calculus of kidney: Secondary | ICD-10-CM

## 2016-07-19 ENCOUNTER — Encounter (HOSPITAL_COMMUNITY): Payer: Self-pay | Admitting: Emergency Medicine

## 2016-07-19 ENCOUNTER — Emergency Department (HOSPITAL_COMMUNITY)
Admission: EM | Admit: 2016-07-19 | Discharge: 2016-07-19 | Disposition: A | Discharge status: Home / Self Care | Payer: BLUE CROSS/BLUE SHIELD | Attending: Emergency Medicine | Admitting: Emergency Medicine

## 2016-07-19 DIAGNOSIS — J020 Streptococcal pharyngitis: Secondary | ICD-10-CM

## 2016-07-19 DIAGNOSIS — Z79899 Other long term (current) drug therapy: Secondary | ICD-10-CM | POA: Insufficient documentation

## 2016-07-19 DIAGNOSIS — I10 Essential (primary) hypertension: Secondary | ICD-10-CM | POA: Insufficient documentation

## 2016-07-19 LAB — RAPID STREP SCREEN (MED CTR MEBANE ONLY): Streptococcus, Group A Screen (Direct): POSITIVE — AB

## 2016-07-19 MED ORDER — ONDANSETRON 4 MG PO TBDP: 4.0000 mg | ORAL_TABLET | Freq: Three times a day (TID) | ORAL | 0 refills | Status: DC | PRN

## 2016-07-19 MED ORDER — CEPHALEXIN 500 MG PO CAPS: 500.0000 mg | ORAL_CAPSULE | Freq: Four times a day (QID) | ORAL | 0 refills | Status: DC

## 2016-07-19 NOTE — ED Provider Notes (Signed)
[facility] DEPT Provider Note   CSN: [id number] Arrival date & time: 07/19/16  1309  By signing my name below, I, [name], attest that this documentation has been prepared under the direction and in the presence of [name], PA-C. Electronically Signed: [name], Scribe. 07/19/16. 1:52 PM.   History   Chief Complaint Chief Complaint  Patient presents with  . Sore Throat   HPI Comments: Bethany King is a 46 y.o. female who presents to the Emergency Department complaining of a constant sore throat that began yesterday. Patient has associated body aches, cough, congestion, and nausea. Patient reports having strep throat 5 weeks ago, took the full course of antibiotics. Patient did have a positive strep test 5 weeks ago. Patient states she has the same symptoms as she did 5 weeks ago. Patient denies any fever, sick contacts, vomiting, or diarrhea.   The history is provided by the patient. No language interpreter was used.    Past Medical History:  Diagnosis Date  . Allergic rhinitis   . Anxiety   . Arthritis   . Bladder pain   . Complication of anesthesia   . Costochondritis    hx  . DDD (degenerative disc disease), lumbar   . Depression   . GERD (gastroesophageal reflux disease)   . Hepatic hemangioma 2006   stable  . History of adenomatous polyp of colon   . History of colitis   . History of gastritis   . History of kidney stones   . HTN (hypertension)   . Irritable bowel syndrome (IBS)   . Medullary sponge kidney    left  . Migraine   . Nephrolithiasis    bilateral-- nonobstructive per CT  . PONV (postoperative nausea and vomiting)   . Spinal stenosis   . Urgency of urination     Patient Active Problem List   Diagnosis Date Noted  . Abdominal pain 06/23/2014  . Acute gastroenteritis 06/23/2014  . Back pain with radiation 10/24/2012  . Breast mass 10/09/2012  . Seasonal allergies 09/22/2012  . Hypokalemia 09/22/2012  . Dysmenorrhea  08/30/2012  . Sciatica 05/29/2012  . Sinusitis 03/02/2012  . Back pain 02/10/2012  . Menorrhagia 12/30/2011  . Shoulder pain, left 12/03/2011  . Costochondritis 11/17/2011  . Shoulder bursitis 11/17/2011  . Recurrent UTI 10/09/2011  . Migraine headache 10/08/2011  . Obesity 09/04/2011  . HTN (hypertension) 07/14/2011  . Anxiety 07/14/2011  . Diarrhea 05/19/2011  . Chest pain 04/30/2011  . IBS (irritable bowel syndrome) 09/23/2010  . Epigastric pain 09/23/2010  . CONSTIPATION 01/29/2010  . GERD 01/24/2010    Past Surgical History:  Procedure Laterality Date  . ANTERIOR CERVICAL DECOMP/DISCECTOMY FUSION  02/26/2014   C5 - C6; fusion with plate  . BACK SURGERY    . CARPAL TUNNEL RELEASE Bilateral right 04-01-2010/  left 05-30-2010  . CHOLECYSTECTOMY N/A 07/14/2013   Procedure: LAPAROSCOPIC CHOLECYSTECTOMY;  Surgeon: Jamesetta So, MD;  Location: [facility];  Service: General;  Laterality: N/A;  . COLONOSCOPY  10-13-2010  . CYSTO WITH HYDRODISTENSION N/A 04/02/2015   Procedure: CYSTOSCOPY/HYDRODISTENSION, BLADDER BIOPSY WITH FULGURATION;  Surgeon: Irine Seal, MD;  Location: [facility];  Service: Urology;  Laterality: N/A;  . CYSTOSCOPY W/ URETERAL STENT PLACEMENT Bilateral 04/08/2016   Procedure: CYSTOSCOPY WITH BILATERAL RETROGRADE PYELOGRAM, BILATERAL URETERAL STENT PLACEMENT;  Surgeon: Cleon Gustin, MD;  Location: [facility];  Service: Urology;  Laterality: Bilateral;  . CYSTOSCOPY W/ URETERAL STENT PLACEMENT Left 04/15/2016   Procedure:  CYSTOSCOPY WITH RETROGRADE PYELOGRAM/URETERAL STENT PLACEMENT;  Surgeon: Cleon Gustin, MD;  Location: [facility];  Service: Urology;  Laterality: Left;  . CYSTOSCOPY WITH HOLMIUM LASER LITHOTRIPSY Right 04/08/2016   Procedure: CYSTOSCOPY WITH RIGHT RENAL STONE EXTRACTION WITH HOLMIUM LASER LITHOTRIPSY;  Surgeon: Cleon Gustin, MD;  Location: [facility];  Service: Urology;  Laterality: Right;  . CYSTOSCOPY WITH HOLMIUM LASER  LITHOTRIPSY Left 04/15/2016   Procedure: CYSTOSCOPY WITH HOLMIUM LASER LITHOTRIPSY;  Surgeon: Cleon Gustin, MD;  Location: [facility];  Service: Urology;  Laterality: Left;  . DIAGNOSTIC LAPAROSCOPY    . DILATION AND CURETTAGE OF UTERUS    . DILITATION & CURRETTAGE/HYSTROSCOPY WITH THERMACHOICE ABLATION N/A 09/07/2012   Procedure: DILATATION & CURETTAGE/HYSTEROSCOPY WITH THERMACHOICE ABLATION;  Surgeon: Florian Buff, MD;  Location: [facility];  Service: Gynecology;  Laterality: N/A;  . ESOPHAGOGASTRODUODENOSCOPY  10/14/10  . EXTRACORPOREAL SHOCK WAVE LITHOTRIPSY  multiple  . PERCUTANEOUS NEPHROSTOLITHOTOMY  2003  . PLANTAR FASCIA SURGERY Bilateral right 2008//  left 2010  . STONE EXTRACTION WITH BASKET Left 04/15/2016   Procedure: STONE EXTRACTION WITH BASKET;  Surgeon: Cleon Gustin, MD;  Location: [facility];  Service: Urology;  Laterality: Left;    OB History    Gravida Para Term Preterm AB Living   0         0   SAB TAB Ectopic Multiple Live Births                   Home Medications    Prior to Admission medications   Medication Sig Start Date End Date Taking? Authorizing Provider  ALPRAZolam Duanne Moron) 1 MG tablet Take 1 mg by mouth 3 (three) times daily as needed for anxiety.    Historical Provider, MD  conjugated estrogens (PREMARIN) vaginal cream 1 applicator at bedtime Patient taking differently: Place 1 Applicatorful vaginally at bedtime. 1 applicator at bedtime [id number]   Florian Buff, MD  hydrochlorothiazide (HYDRODIURIL) 25 MG tablet Take 25 mg by mouth every morning.     Historical Provider, MD  loratadine (CLARITIN) 10 MG tablet Take 10 mg by mouth daily.    Historical Provider, MD  potassium chloride SA (K-DUR,KLOR-CON) 20 MEQ tablet Take 40 mEq by mouth every morning.    Historical Provider, MD  Probiotic Product (HEALTHY COLON) CAPS Take 1 capsule by mouth daily.      Historical Provider, MD  SUMAtriptan (IMITREX) 100 MG tablet Take 100 mg by mouth every 2 (two) hours as  needed for migraine.  03/12/16   Historical Provider, MD  Vilazodone HCl (VIIBRYD) 40 MG TABS Take 40 mg by mouth every morning.     Historical Provider, MD    Family History Family History  Problem Relation Age of Onset  . Irritable bowel syndrome Mother   . Scleroderma Mother   . Rheum arthritis Mother   . Asthma Mother   . Melanoma Father   . Cancer Father     Melanoma  . Colon cancer Cousin 74    second cousin Sunday Spillers [place])  . Cancer Maternal Grandmother     Ovarian  . Diabetes Maternal Grandmother   . Thyroid disease Maternal Aunt     Social History Social History  Substance Use Topics  . Smoking status: Never Smoker  . Smokeless tobacco: Never Used  . Alcohol use No     Allergies   Hydromorphone; Prednisone; Tramadol hcl; and Sulfa antibiotics   Review of Systems Review of Systems  All other systems reviewed  and are negative.    Physical Exam Updated Vital Signs BP 140/97 (BP Location: Left Arm)   Pulse 86   Temp 97.7 F (36.5 C) (Oral)   Resp 18   Ht 5\' 3"  (1.6 m)   Wt 180 lb (81.6 kg)   LMP 07/12/2016   SpO2 99%   BMI 31.89 kg/m   Physical Exam  Constitutional: She is oriented to person, place, and time. She appears well-developed.  HENT:  Head: Normocephalic and atraumatic.  Mouth/Throat: Oropharynx is clear and moist.  Erythematous throat, white exudate.  No cervical lymph adenopathy.   Eyes: Conjunctivae and EOM are normal. Pupils are equal, round, and reactive to light.  Neck: Normal range of motion. Neck supple.  Cardiovascular: Normal rate.   Pulmonary/Chest: Effort normal.  Abdominal: Soft. Bowel sounds are normal.  Musculoskeletal: Normal range of motion.  Neurological: She is alert and oriented to person, place, and time.  Skin: Skin is warm and dry.     ED Treatments / Results  DIAGNOSTIC STUDIES: Oxygen Saturation is 99% on RA, normal by my interpretation.    COORDINATION OF CARE: 1:50 PM Discussed treatment plan with  pt at bedside and pt agreed to plan.  Labs (all labs ordered are listed, but only abnormal results are displayed) Labs Reviewed - No data to display  EKG  EKG Interpretation None       Radiology No results found.  Procedures Procedures (including critical care time)  Medications Ordered in ED Medications - No data to display   Initial Impression / Assessment and Plan / ED Course  I have reviewed the triage vital signs and the nursing notes.  Pertinent labs & imaging results that were available during my care of the patient were reviewed by me and considered in my medical decision making (see chart for details).     Positive strep   Final Clinical Impressions(s) / ED Diagnoses   Final diagnoses:  Strep throat    New Prescriptions New Prescriptions   No medications on file   Meds ordered this encounter  Medications  . cephALEXin (KEFLEX) 500 MG capsule    Sig: Take 1 capsule (500 mg total) by mouth 4 (four) times daily.    Dispense:  40 capsule    Refill:  0    Order Specific Question:   Supervising Provider    Answer:   MILLER, BRIAN [[id number]]  . HYDROcodone-acetaminophen (NORCO) 10-325 MG tablet    Sig: Take 1 tablet by mouth every 6 (six) hours as needed.  . cyclobenzaprine (FLEXERIL) 10 MG tablet    Sig: Take 10 mg by mouth 3 (three) times daily as needed for muscle spasms.  . ondansetron (ZOFRAN ODT) 4 MG disintegrating tablet    Sig: Take 1 tablet (4 mg total) by mouth every 8 (eight) hours as needed for nausea or vomiting.    Dispense:  20 tablet    Refill:  0    Order Specific Question:   Supervising Provider    Answer:   Noemi Chapel [[id number]]  An After Visit Summary was printed and given to the patient.   Hollace Kinnier Trappe, PA-C 07/19/16 [place], MD 07/28/16 [phone]

## 2016-07-19 NOTE — Discharge Instructions (Signed)
Return if any problems.

## 2016-07-19 NOTE — ED Triage Notes (Signed)
PT c/o sore throat and body aches that yesterday. PT denies any fevers today.

## 2016-07-29 ENCOUNTER — Other Ambulatory Visit: Payer: Self-pay | Admitting: Urology

## 2016-07-29 DIAGNOSIS — N200 Calculus of kidney: Secondary | ICD-10-CM

## 2016-12-15 DIAGNOSIS — Z1389 Encounter for screening for other disorder: Secondary | ICD-10-CM | POA: Diagnosis not present

## 2016-12-15 DIAGNOSIS — I10 Essential (primary) hypertension: Secondary | ICD-10-CM | POA: Diagnosis not present

## 2016-12-15 DIAGNOSIS — F329 Major depressive disorder, single episode, unspecified: Secondary | ICD-10-CM | POA: Diagnosis not present

## 2016-12-15 DIAGNOSIS — M1990 Unspecified osteoarthritis, unspecified site: Secondary | ICD-10-CM | POA: Diagnosis not present

## 2016-12-15 DIAGNOSIS — Z6831 Body mass index (BMI) 31.0-31.9, adult: Secondary | ICD-10-CM | POA: Diagnosis not present

## 2016-12-18 ENCOUNTER — Ambulatory Visit (HOSPITAL_COMMUNITY)
Admission: RE | Admit: 2016-12-18 | Discharge: 2016-12-18 | Disposition: A | Discharge status: Home / Self Care | Payer: Medicare Other | Source: Ambulatory Visit | Attending: Urology | Admitting: Urology

## 2016-12-18 DIAGNOSIS — N1330 Unspecified hydronephrosis: Secondary | ICD-10-CM | POA: Diagnosis not present

## 2016-12-18 DIAGNOSIS — N200 Calculus of kidney: Secondary | ICD-10-CM | POA: Diagnosis not present

## 2016-12-18 DIAGNOSIS — N202 Calculus of kidney with calculus of ureter: Secondary | ICD-10-CM | POA: Diagnosis not present

## 2016-12-23 ENCOUNTER — Ambulatory Visit (INDEPENDENT_AMBULATORY_CARE_PROVIDER_SITE_OTHER): Payer: Medicare Other | Admitting: Urology

## 2016-12-23 DIAGNOSIS — N200 Calculus of kidney: Secondary | ICD-10-CM | POA: Diagnosis not present

## 2016-12-29 DIAGNOSIS — M4302 Spondylolysis, cervical region: Secondary | ICD-10-CM | POA: Diagnosis not present

## 2016-12-29 DIAGNOSIS — M5020 Other cervical disc displacement, unspecified cervical region: Secondary | ICD-10-CM | POA: Diagnosis not present

## 2016-12-30 DIAGNOSIS — F330 Major depressive disorder, recurrent, mild: Secondary | ICD-10-CM | POA: Diagnosis not present

## 2016-12-30 DIAGNOSIS — F419 Anxiety disorder, unspecified: Secondary | ICD-10-CM | POA: Diagnosis not present

## 2016-12-30 DIAGNOSIS — G894 Chronic pain syndrome: Secondary | ICD-10-CM | POA: Diagnosis not present

## 2016-12-30 DIAGNOSIS — E669 Obesity, unspecified: Secondary | ICD-10-CM | POA: Diagnosis not present

## 2016-12-30 DIAGNOSIS — K589 Irritable bowel syndrome without diarrhea: Secondary | ICD-10-CM | POA: Diagnosis not present

## 2016-12-30 DIAGNOSIS — Z6831 Body mass index (BMI) 31.0-31.9, adult: Secondary | ICD-10-CM | POA: Diagnosis not present

## 2016-12-30 DIAGNOSIS — Z0001 Encounter for general adult medical examination with abnormal findings: Secondary | ICD-10-CM | POA: Diagnosis not present

## 2017-01-01 DIAGNOSIS — D649 Anemia, unspecified: Secondary | ICD-10-CM | POA: Diagnosis not present

## 2017-01-01 DIAGNOSIS — G629 Polyneuropathy, unspecified: Secondary | ICD-10-CM | POA: Diagnosis not present

## 2017-01-01 DIAGNOSIS — E538 Deficiency of other specified B group vitamins: Secondary | ICD-10-CM | POA: Diagnosis not present

## 2017-01-01 DIAGNOSIS — R109 Unspecified abdominal pain: Secondary | ICD-10-CM | POA: Diagnosis not present

## 2017-01-01 DIAGNOSIS — Z1389 Encounter for screening for other disorder: Secondary | ICD-10-CM | POA: Diagnosis not present

## 2017-01-01 DIAGNOSIS — M2550 Pain in unspecified joint: Secondary | ICD-10-CM | POA: Diagnosis not present

## 2017-01-01 DIAGNOSIS — E785 Hyperlipidemia, unspecified: Secondary | ICD-10-CM | POA: Diagnosis not present

## 2017-01-01 DIAGNOSIS — R5383 Other fatigue: Secondary | ICD-10-CM | POA: Diagnosis not present

## 2017-01-06 ENCOUNTER — Ambulatory Visit (INDEPENDENT_AMBULATORY_CARE_PROVIDER_SITE_OTHER): Payer: Medicare Other | Admitting: Podiatry

## 2017-01-06 ENCOUNTER — Ambulatory Visit (INDEPENDENT_AMBULATORY_CARE_PROVIDER_SITE_OTHER): Payer: Medicare Other

## 2017-01-06 VITALS — BP 143/96 | HR 68

## 2017-01-06 DIAGNOSIS — M79671 Pain in right foot: Secondary | ICD-10-CM | POA: Diagnosis not present

## 2017-01-06 DIAGNOSIS — M7750 Other enthesopathy of unspecified foot: Secondary | ICD-10-CM

## 2017-01-06 DIAGNOSIS — M79672 Pain in left foot: Secondary | ICD-10-CM

## 2017-01-06 DIAGNOSIS — M779 Enthesopathy, unspecified: Secondary | ICD-10-CM | POA: Diagnosis not present

## 2017-01-06 DIAGNOSIS — M778 Other enthesopathies, not elsewhere classified: Secondary | ICD-10-CM

## 2017-01-06 MED ORDER — TRIAMCINOLONE ACETONIDE 10 MG/ML IJ SUSP
10.0000 mg | Freq: Once | INTRAMUSCULAR | Status: AC
  Administered 2017-01-06: 10 mg

## 2017-01-06 NOTE — Progress Notes (Signed)
Subjective:    Patient ID: Bethany King, female    DOB: 02-14-1971, 45 y.o.   MRN: [id number]  HPI    Review of Systems  Gastrointestinal: Positive for abdominal distention, abdominal pain and nausea.       Bloating  Musculoskeletal: Positive for back pain and joint swelling.       Joint pain  Psychiatric/Behavioral:       Anxiety/depression  All other systems reviewed and are negative.      Objective:   Physical Exam        Assessment & Plan:

## 2017-01-07 NOTE — Progress Notes (Signed)
Subjective:    Patient ID: Bethany King, female   DOB: 46 y.o.   MRN: [id number]   HPI patient presents with quite a bit of discomfort in the sinus tarsi bilateral with inflammation of the capsule upon movement with no indications of significant cyst formation    ROS      Objective:  Physical Exam inflammatory capsulitis of the sinus tarsi bilateral with inflammation fluid buildup with no other pathology noted     Assessment:   Appears to be inflammatory capsulitis sinus tarsi bilateral      Plan:    H&P x-ray reviewed and today I injected the sinus tarsi capsule 3 mg Kenalog 5 mg Xylocaine advised on physical therapy reduced activity and supportive shoes and will be seen back to recheck  X-rays were negative for signs that there is arthritis or damage to the sinus tarsi

## 2017-02-08 ENCOUNTER — Ambulatory Visit (INDEPENDENT_AMBULATORY_CARE_PROVIDER_SITE_OTHER): Payer: Medicare Other | Admitting: Obstetrics & Gynecology

## 2017-02-08 ENCOUNTER — Encounter: Payer: Self-pay | Admitting: Obstetrics & Gynecology

## 2017-02-08 VITALS — BP 118/70 | HR 72 | Ht 63.0 in | Wt 177.5 lb

## 2017-02-08 DIAGNOSIS — R102 Pelvic and perineal pain: Secondary | ICD-10-CM

## 2017-02-08 DIAGNOSIS — R32 Unspecified urinary incontinence: Secondary | ICD-10-CM

## 2017-02-08 LAB — POCT URINALYSIS DIPSTICK
Glucose, UA: NEGATIVE
Ketones, UA: NEGATIVE
NITRITE UA: NEGATIVE
PROTEIN UA: NEGATIVE

## 2017-02-08 MED ORDER — ESTROGENS, CONJUGATED 0.625 MG/GM VA CREA: TOPICAL_CREAM | VAGINAL | 12 refills | Status: DC

## 2017-02-08 MED ORDER — NAPROXEN 375 MG PO TABS: 375.0000 mg | ORAL_TABLET | Freq: Two times a day (BID) | ORAL | 1 refills | Status: DC

## 2017-02-08 NOTE — Progress Notes (Signed)
Chief Complaint  Patient presents with  . + cramps and back pain    light vaginal discharge; trouble holding urine; pelvic pain    Blood pressure 118/70, pulse 72, height 5\' 3"  (1.6 m), weight 177 lb 8 oz (80.5 kg).  46 y.o. G0P0 No LMP recorded. Patient has had an ablation. The current method of family planning is none.  Outpatient Encounter Prescriptions as of 02/08/2017  Medication Sig Note  . ALPRAZolam (XANAX) 1 MG tablet Take 1 mg by mouth at bedtime.    . dicyclomine (BENTYL) 20 MG tablet Take 20 mg by mouth 3 (three) times daily.    Marland Kitchen escitalopram (LEXAPRO) 20 MG tablet Take 20 mg by mouth daily.   . hydrochlorothiazide (HYDRODIURIL) 25 MG tablet Take 25 mg by mouth every morning.    Marland Kitchen HYDROcodone-acetaminophen (NORCO) 10-325 MG tablet Take 1 tablet by mouth every 6 (six) hours as needed.   . pantoprazole (PROTONIX) 40 MG tablet Take 40 mg by mouth daily.   . potassium chloride SA (K-DUR,KLOR-CON) 20 MEQ tablet Take 40 mEq by mouth every morning. 04/15/2016: Took 60 mEq  . [DISCONTINUED] benazepril-hydrochlorthiazide (LOTENSIN HCT) 20-25 MG tablet Take 1 tablet by mouth daily.   Marland Kitchen conjugated estrogens (PREMARIN) vaginal cream 1 gram daily   . naproxen (NAPROSYN) 375 MG tablet Take 1 tablet (375 mg total) by mouth 2 (two) times daily with a meal.   . [DISCONTINUED] cephALEXin (KEFLEX) 500 MG capsule Take 1 capsule (500 mg total) by mouth 4 (four) times daily. (Patient not taking: Reported on 01/06/2017)   . [DISCONTINUED] clindamycin (CLEOCIN) 150 MG capsule Take by mouth 3 (three) times daily.   . [DISCONTINUED] conjugated estrogens (PREMARIN) vaginal cream 1 applicator at bedtime (Patient not taking: Reported on 01/06/2017)   . [DISCONTINUED] cyclobenzaprine (FLEXERIL) 10 MG tablet Take 10 mg by mouth 3 (three) times daily as needed for muscle spasms.   . [DISCONTINUED] diphenhydrAMINE (BENADRYL) 25 mg capsule Take 25 mg by mouth 3 (three) times daily.   . [DISCONTINUED]  loratadine (CLARITIN) 10 MG tablet Take 10 mg by mouth daily.   . [DISCONTINUED] ondansetron (ZOFRAN ODT) 4 MG disintegrating tablet Take 1 tablet (4 mg total) by mouth every 8 (eight) hours as needed for nausea or vomiting. (Patient not taking: Reported on 01/06/2017)   . [DISCONTINUED] Probiotic Product (HEALTHY COLON) CAPS Take 1 capsule by mouth daily.     . [DISCONTINUED] SUMAtriptan (IMITREX) 100 MG tablet Take 100 mg by mouth every 2 (two) hours as needed for migraine.  04/01/2016: Takes sparingly  . [DISCONTINUED] Vilazodone HCl (VIIBRYD) 40 MG TABS Take 40 mg by mouth every morning.     No facility-administered encounter medications on file as of 02/08/2017.     Subjective Patient presents complaining of a couple month history of midline pelvic pain felt like she is on her period but not associated with any bleeding She of course is 4 years out from an endometrial ablation and we actually did a pelvic sonogram June of last year to evaluate for the possibility of hematometra On a gram was normal at that time As it turns out she had what probably was bacterial vaginosis and cervicitis vaginitis from the vaginal mucosa becoming increasingly atrophic in this perimenopausal part of her life Got her started on Premarin vaginal cream which I, salt all those issues The pain is somewhat similar to before but but also a bit more than has been in the past her urine  is basically clear today Culture that   Objective General WDWN female NAD Vulva:  normal appearing vulva with no masses, tenderness or lesions Vagina:  normal mucosa, no discharge Cervix:  no cervical motion tenderness and no lesions Uterus:  normal size, contour, position, consistency, mobility, non-tender Adnexa: ovaries:present,  normal adnexa in size, nontender and no masses   Pertinent ROS No burning with urination, frequency or urgency No nausea, vomiting or diarrhea Nor fever chills or other constitutional  symptoms   Labs or studies None new, reviewed    Impression Diagnoses this Encounter::   ICD-10-CM   1. Pelvic pain in female R10.2   2. Urinary incontinence, unspecified type R32 POCT Urinalysis Dipstick    Established relevant diagnosis(es):   Plan/Recommendations: Meds ordered this encounter  Medications  . naproxen (NAPROSYN) 375 MG tablet    Sig: Take 1 tablet (375 mg total) by mouth 2 (two) times daily with a meal.    Dispense:  60 tablet    Refill:  1  . conjugated estrogens (PREMARIN) vaginal cream    Sig: 1 gram daily    Dispense:  30 g    Refill:  12    Labs or Scans Ordered: Orders Placed This Encounter  Procedures  . POCT Urinalysis Dipstick    Management:: Will try empiric trial of naproxen sodium and restart her premarin   Follow up Return in about 1 month (around 03/11/2017) for Follow up, with Dr Elonda Husky.     All questions were answered.  Past Medical History:  Diagnosis Date  . Allergic rhinitis   . Anxiety   . Arthritis   . Bladder pain   . Complication of anesthesia   . Costochondritis    hx  . DDD (degenerative disc disease), lumbar   . Depression   . GERD (gastroesophageal reflux disease)   . Hepatic hemangioma 2006   stable  . History of adenomatous polyp of colon   . History of colitis   . History of gastritis   . History of kidney stones   . HTN (hypertension)   . Irritable bowel syndrome (IBS)   . Medullary sponge kidney    left  . Migraine   . Nephrolithiasis    bilateral-- nonobstructive per CT  . PONV (postoperative nausea and vomiting)   . Spinal stenosis   . Urgency of urination     Past Surgical History:  Procedure Laterality Date  . ANTERIOR CERVICAL DECOMP/DISCECTOMY FUSION  02/26/2014   C5 - C6; fusion with plate  . BACK SURGERY    . CARPAL TUNNEL RELEASE Bilateral right 04-01-2010/  left 05-30-2010  . CHOLECYSTECTOMY N/A 07/14/2013   Procedure: LAPAROSCOPIC CHOLECYSTECTOMY;  Surgeon: Jamesetta So, MD;   Location: [facility];  Service: General;  Laterality: N/A;  . COLONOSCOPY  10-13-2010  . CYSTO WITH HYDRODISTENSION N/A 04/02/2015   Procedure: CYSTOSCOPY/HYDRODISTENSION, BLADDER BIOPSY WITH FULGURATION;  Surgeon: Irine Seal, MD;  Location: [facility];  Service: Urology;  Laterality: N/A;  . CYSTOSCOPY W/ URETERAL STENT PLACEMENT Bilateral 04/08/2016   Procedure: CYSTOSCOPY WITH BILATERAL RETROGRADE PYELOGRAM, BILATERAL URETERAL STENT PLACEMENT;  Surgeon: Cleon Gustin, MD;  Location: [facility];  Service: Urology;  Laterality: Bilateral;  . CYSTOSCOPY W/ URETERAL STENT PLACEMENT Left 04/15/2016   Procedure: CYSTOSCOPY WITH RETROGRADE PYELOGRAM/URETERAL STENT PLACEMENT;  Surgeon: Cleon Gustin, MD;  Location: [facility];  Service: Urology;  Laterality: Left;  . CYSTOSCOPY WITH HOLMIUM LASER LITHOTRIPSY Right 04/08/2016   Procedure: CYSTOSCOPY WITH RIGHT RENAL  STONE EXTRACTION WITH HOLMIUM LASER LITHOTRIPSY;  Surgeon: Cleon Gustin, MD;  Location: [facility];  Service: Urology;  Laterality: Right;  . CYSTOSCOPY WITH HOLMIUM LASER LITHOTRIPSY Left 04/15/2016   Procedure: CYSTOSCOPY WITH HOLMIUM LASER LITHOTRIPSY;  Surgeon: Cleon Gustin, MD;  Location: [facility];  Service: Urology;  Laterality: Left;  . DIAGNOSTIC LAPAROSCOPY    . DILATION AND CURETTAGE OF UTERUS    . DILITATION & CURRETTAGE/HYSTROSCOPY WITH THERMACHOICE ABLATION N/A 09/07/2012   Procedure: DILATATION & CURETTAGE/HYSTEROSCOPY WITH THERMACHOICE ABLATION;  Surgeon: Florian Buff, MD;  Location: [facility];  Service: Gynecology;  Laterality: N/A;  . ESOPHAGOGASTRODUODENOSCOPY  10/14/10  . EXTRACORPOREAL SHOCK WAVE LITHOTRIPSY  multiple  . PERCUTANEOUS NEPHROSTOLITHOTOMY  2003  . PLANTAR FASCIA SURGERY Bilateral right 2008//  left 2010  . STONE EXTRACTION WITH BASKET Left 04/15/2016   Procedure: STONE EXTRACTION WITH BASKET;  Surgeon: Cleon Gustin, MD;  Location: [facility];  Service: Urology;  Laterality: Left;    OB  History    Gravida Para Term Preterm AB Living   0         0   SAB TAB Ectopic Multiple Live Births                  Allergies  Allergen Reactions  . Hydromorphone Itching  . Prednisone Other (See Comments)    Increase anxiety symptoms, insomnia  . Tramadol Hcl Other (See Comments)    dizziness  . Sulfa Antibiotics Rash    Social History   Social History  . Marital status: Divorced    Spouse name: N/A  . Number of children: 0  . Years of education: N/A   Occupational History  . ORDER PROCESSER [employer]   Social History Main Topics  . Smoking status: Never Smoker  . Smokeless tobacco: Never Used  . Alcohol use No  . Drug use: No  . Sexual activity: Not Currently    Birth control/ protection: Surgical     Comment: ablation   Other Topics Concern  . None   Social History Narrative  . None    Family History  Problem Relation Age of Onset  . Irritable bowel syndrome Mother   . Scleroderma Mother   . Rheum arthritis Mother   . Asthma Mother   . Melanoma Father   . Cancer Father        Melanoma  . Colon cancer Cousin 27       second cousin Sunday Spillers [place])  . Cancer Maternal Grandmother        Ovarian  . Diabetes Maternal Grandmother   . Thyroid disease Maternal Aunt

## 2017-02-08 NOTE — Addendum Note (Signed)
Addended by: Linton Rump on: 02/08/2017 03:51 PM   Modules accepted: Orders

## 2017-02-10 LAB — URINE CULTURE: Organism ID, Bacteria: NO GROWTH

## 2017-02-22 DIAGNOSIS — Z6831 Body mass index (BMI) 31.0-31.9, adult: Secondary | ICD-10-CM | POA: Diagnosis not present

## 2017-02-22 DIAGNOSIS — N200 Calculus of kidney: Secondary | ICD-10-CM | POA: Diagnosis not present

## 2017-02-22 DIAGNOSIS — N390 Urinary tract infection, site not specified: Secondary | ICD-10-CM | POA: Diagnosis not present

## 2017-02-22 DIAGNOSIS — E6609 Other obesity due to excess calories: Secondary | ICD-10-CM | POA: Diagnosis not present

## 2017-02-22 DIAGNOSIS — E669 Obesity, unspecified: Secondary | ICD-10-CM | POA: Diagnosis not present

## 2017-02-25 ENCOUNTER — Ambulatory Visit: Payer: Medicare Other | Admitting: Podiatry

## 2017-03-04 DIAGNOSIS — D1809 Hemangioma of other sites: Secondary | ICD-10-CM | POA: Diagnosis not present

## 2017-03-04 DIAGNOSIS — N23 Unspecified renal colic: Secondary | ICD-10-CM | POA: Diagnosis not present

## 2017-03-04 DIAGNOSIS — N200 Calculus of kidney: Secondary | ICD-10-CM | POA: Diagnosis not present

## 2017-03-07 DIAGNOSIS — Z23 Encounter for immunization: Secondary | ICD-10-CM | POA: Diagnosis not present

## 2017-03-08 ENCOUNTER — Ambulatory Visit: Payer: Medicare Other | Admitting: Obstetrics & Gynecology

## 2017-03-18 DIAGNOSIS — N200 Calculus of kidney: Secondary | ICD-10-CM | POA: Diagnosis not present

## 2017-03-23 DIAGNOSIS — N390 Urinary tract infection, site not specified: Secondary | ICD-10-CM | POA: Diagnosis not present

## 2017-03-23 DIAGNOSIS — I10 Essential (primary) hypertension: Secondary | ICD-10-CM | POA: Diagnosis not present

## 2017-03-23 DIAGNOSIS — M545 Low back pain: Secondary | ICD-10-CM | POA: Diagnosis not present

## 2017-03-23 DIAGNOSIS — Z6831 Body mass index (BMI) 31.0-31.9, adult: Secondary | ICD-10-CM | POA: Diagnosis not present

## 2017-03-25 ENCOUNTER — Encounter: Payer: Self-pay | Admitting: Internal Medicine

## 2017-03-26 ENCOUNTER — Ambulatory Visit: Payer: Medicare Other | Admitting: Podiatry

## 2017-03-30 DIAGNOSIS — Z1231 Encounter for screening mammogram for malignant neoplasm of breast: Secondary | ICD-10-CM | POA: Diagnosis not present

## 2017-04-09 DIAGNOSIS — J329 Chronic sinusitis, unspecified: Secondary | ICD-10-CM | POA: Diagnosis not present

## 2017-04-09 DIAGNOSIS — Z6831 Body mass index (BMI) 31.0-31.9, adult: Secondary | ICD-10-CM | POA: Diagnosis not present

## 2017-04-09 DIAGNOSIS — M5030 Other cervical disc degeneration, unspecified cervical region: Secondary | ICD-10-CM | POA: Diagnosis not present

## 2017-04-09 DIAGNOSIS — M545 Low back pain: Secondary | ICD-10-CM | POA: Diagnosis not present

## 2017-04-20 DIAGNOSIS — M48061 Spinal stenosis, lumbar region without neurogenic claudication: Secondary | ICD-10-CM | POA: Diagnosis not present

## 2017-04-20 DIAGNOSIS — M5135 Other intervertebral disc degeneration, thoracolumbar region: Secondary | ICD-10-CM | POA: Diagnosis not present

## 2017-04-20 DIAGNOSIS — M5124 Other intervertebral disc displacement, thoracic region: Secondary | ICD-10-CM | POA: Diagnosis not present

## 2017-04-20 DIAGNOSIS — M5127 Other intervertebral disc displacement, lumbosacral region: Secondary | ICD-10-CM | POA: Diagnosis not present

## 2017-04-20 DIAGNOSIS — M47816 Spondylosis without myelopathy or radiculopathy, lumbar region: Secondary | ICD-10-CM | POA: Diagnosis not present

## 2017-04-20 DIAGNOSIS — M5134 Other intervertebral disc degeneration, thoracic region: Secondary | ICD-10-CM | POA: Diagnosis not present

## 2017-04-21 ENCOUNTER — Encounter: Payer: Self-pay | Admitting: Podiatry

## 2017-04-21 ENCOUNTER — Ambulatory Visit (INDEPENDENT_AMBULATORY_CARE_PROVIDER_SITE_OTHER): Payer: Medicare Other | Admitting: Podiatry

## 2017-04-21 DIAGNOSIS — M779 Enthesopathy, unspecified: Secondary | ICD-10-CM

## 2017-04-21 MED ORDER — TRIAMCINOLONE ACETONIDE 10 MG/ML IJ SUSP
10.0000 mg | Freq: Once | INTRAMUSCULAR | Status: AC
  Administered 2017-04-21: 10 mg

## 2017-04-21 NOTE — Progress Notes (Signed)
Subjective:    Patient ID: Bethany King, female   DOB: 46 y.o.   MRN: [id number]   HPI patient states still having pain in the ankles and I am also getting swelling in my ankles bilateral    ROS      Objective:  Physical Exam neurovascular status intact with inflammation sinus tarsi bilateral with fluid buildup and pain also noted with mild swelling     Assessment:    Capsulitis sinus tarsitis with mild edema left ankle and right ankle with negative Homans sign noted     Plan:   Injected the sinus tarsi bilateral 3 mg Kenalog 5 mg Xylocaine and dispensed ankle compression stockings bilateral with instructions on usage

## 2017-04-26 DIAGNOSIS — E6609 Other obesity due to excess calories: Secondary | ICD-10-CM | POA: Diagnosis not present

## 2017-04-26 DIAGNOSIS — M5417 Radiculopathy, lumbosacral region: Secondary | ICD-10-CM | POA: Diagnosis not present

## 2017-04-26 DIAGNOSIS — M5410 Radiculopathy, site unspecified: Secondary | ICD-10-CM | POA: Diagnosis not present

## 2017-04-26 DIAGNOSIS — I10 Essential (primary) hypertension: Secondary | ICD-10-CM | POA: Diagnosis not present

## 2017-04-26 DIAGNOSIS — M546 Pain in thoracic spine: Secondary | ICD-10-CM | POA: Diagnosis not present

## 2017-04-26 DIAGNOSIS — M545 Low back pain: Secondary | ICD-10-CM | POA: Diagnosis not present

## 2017-04-26 DIAGNOSIS — Z6831 Body mass index (BMI) 31.0-31.9, adult: Secondary | ICD-10-CM | POA: Diagnosis not present

## 2017-04-26 DIAGNOSIS — M5124 Other intervertebral disc displacement, thoracic region: Secondary | ICD-10-CM | POA: Diagnosis not present

## 2017-05-04 DIAGNOSIS — M47814 Spondylosis without myelopathy or radiculopathy, thoracic region: Secondary | ICD-10-CM | POA: Diagnosis not present

## 2017-05-04 DIAGNOSIS — M4716 Other spondylosis with myelopathy, lumbar region: Secondary | ICD-10-CM | POA: Diagnosis not present

## 2017-05-21 DIAGNOSIS — N202 Calculus of kidney with calculus of ureter: Secondary | ICD-10-CM | POA: Diagnosis not present

## 2017-05-21 DIAGNOSIS — F329 Major depressive disorder, single episode, unspecified: Secondary | ICD-10-CM | POA: Diagnosis not present

## 2017-05-21 DIAGNOSIS — E782 Mixed hyperlipidemia: Secondary | ICD-10-CM | POA: Diagnosis not present

## 2017-05-21 DIAGNOSIS — N390 Urinary tract infection, site not specified: Secondary | ICD-10-CM | POA: Diagnosis not present

## 2017-05-21 DIAGNOSIS — M545 Low back pain: Secondary | ICD-10-CM | POA: Diagnosis not present

## 2017-05-21 DIAGNOSIS — E669 Obesity, unspecified: Secondary | ICD-10-CM | POA: Diagnosis not present

## 2017-05-21 DIAGNOSIS — I10 Essential (primary) hypertension: Secondary | ICD-10-CM | POA: Diagnosis not present

## 2017-05-21 DIAGNOSIS — Z1389 Encounter for screening for other disorder: Secondary | ICD-10-CM | POA: Diagnosis not present

## 2017-05-21 DIAGNOSIS — Z6831 Body mass index (BMI) 31.0-31.9, adult: Secondary | ICD-10-CM | POA: Diagnosis not present

## 2017-05-27 ENCOUNTER — Other Ambulatory Visit: Payer: Self-pay | Admitting: Urology

## 2017-05-27 ENCOUNTER — Ambulatory Visit (HOSPITAL_COMMUNITY)
Admission: RE | Admit: 2017-05-27 | Discharge: 2017-05-27 | Disposition: A | Discharge status: Home / Self Care | Payer: Medicare Other | Source: Ambulatory Visit | Attending: Urology | Admitting: Urology

## 2017-05-27 DIAGNOSIS — N200 Calculus of kidney: Secondary | ICD-10-CM | POA: Diagnosis not present

## 2017-06-02 ENCOUNTER — Ambulatory Visit (INDEPENDENT_AMBULATORY_CARE_PROVIDER_SITE_OTHER): Payer: Medicare Other | Admitting: Urology

## 2017-06-02 DIAGNOSIS — N200 Calculus of kidney: Secondary | ICD-10-CM | POA: Diagnosis not present

## 2017-06-07 ENCOUNTER — Other Ambulatory Visit: Payer: Self-pay | Admitting: Urology

## 2017-06-23 ENCOUNTER — Encounter (HOSPITAL_COMMUNITY): Payer: Self-pay | Admitting: Physical Therapy

## 2017-06-23 ENCOUNTER — Ambulatory Visit (HOSPITAL_COMMUNITY): Payer: Medicare Other | Attending: Internal Medicine | Admitting: Physical Therapy

## 2017-06-23 DIAGNOSIS — G8929 Other chronic pain: Secondary | ICD-10-CM | POA: Diagnosis not present

## 2017-06-23 DIAGNOSIS — M542 Cervicalgia: Secondary | ICD-10-CM | POA: Diagnosis not present

## 2017-06-23 DIAGNOSIS — M5442 Lumbago with sciatica, left side: Secondary | ICD-10-CM | POA: Diagnosis not present

## 2017-06-23 NOTE — Therapy (Signed)
[facility] [place], [place], [address] Phone: [phone]   Fax:  [phone]  Physical Therapy Evaluation  Patient Details  Name: Bethany King MRN: [id number] Date of Birth: 02/03/1971 No Data Recorded  Encounter Date: 06/23/2017    Past Medical History:  Diagnosis Date  . Allergic rhinitis   . Anxiety   . Arthritis   . Bladder pain   . Complication of anesthesia   . Costochondritis    hx  . DDD (degenerative disc disease), lumbar   . Depression   . GERD (gastroesophageal reflux disease)   . Hepatic hemangioma 2006   stable  . History of adenomatous polyp of colon   . History of colitis   . History of gastritis   . History of kidney stones   . HTN (hypertension)   . Irritable bowel syndrome (IBS)   . Medullary sponge kidney    left  . Migraine   . Nephrolithiasis    bilateral-- nonobstructive per CT  . PONV (postoperative nausea and vomiting)   . Spinal stenosis   . Urgency of urination     Past Surgical History:  Procedure Laterality Date  . ANTERIOR CERVICAL DECOMP/DISCECTOMY FUSION  02/26/2014   C5 - C6; fusion with plate  . BACK SURGERY    . CARPAL TUNNEL RELEASE Bilateral right 04-01-2010/  left 05-30-2010  . CHOLECYSTECTOMY N/A 07/14/2013   Procedure: LAPAROSCOPIC CHOLECYSTECTOMY;  Surgeon: Jamesetta So, MD;  Location: [facility];  Service: General;  Laterality: N/A;  . COLONOSCOPY  10-13-2010  . CYSTO WITH HYDRODISTENSION N/A 04/02/2015   Procedure: CYSTOSCOPY/HYDRODISTENSION, BLADDER BIOPSY WITH FULGURATION;  Surgeon: Irine Seal, MD;  Location: [facility];  Service: Urology;  Laterality: N/A;  . CYSTOSCOPY W/ URETERAL STENT PLACEMENT Bilateral 04/08/2016   Procedure: CYSTOSCOPY WITH BILATERAL RETROGRADE PYELOGRAM, BILATERAL URETERAL STENT PLACEMENT;  Surgeon: Cleon Gustin, MD;  Location: [facility];  Service: Urology;  Laterality: Bilateral;  . CYSTOSCOPY W/ URETERAL STENT  PLACEMENT Left 04/15/2016   Procedure: CYSTOSCOPY WITH RETROGRADE PYELOGRAM/URETERAL STENT PLACEMENT;  Surgeon: Cleon Gustin, MD;  Location: [facility];  Service: Urology;  Laterality: Left;  . CYSTOSCOPY WITH HOLMIUM LASER LITHOTRIPSY Right 04/08/2016   Procedure: CYSTOSCOPY WITH RIGHT RENAL STONE EXTRACTION WITH HOLMIUM LASER LITHOTRIPSY;  Surgeon: Cleon Gustin, MD;  Location: [facility];  Service: Urology;  Laterality: Right;  . CYSTOSCOPY WITH HOLMIUM LASER LITHOTRIPSY Left 04/15/2016   Procedure: CYSTOSCOPY WITH HOLMIUM LASER LITHOTRIPSY;  Surgeon: Cleon Gustin, MD;  Location: [facility];  Service: Urology;  Laterality: Left;  . DIAGNOSTIC LAPAROSCOPY    . DILATION AND CURETTAGE OF UTERUS    . DILITATION & CURRETTAGE/HYSTROSCOPY WITH THERMACHOICE ABLATION N/A 09/07/2012   Procedure: DILATATION & CURETTAGE/HYSTEROSCOPY WITH THERMACHOICE ABLATION;  Surgeon: Florian Buff, MD;  Location: [facility];  Service: Gynecology;  Laterality: N/A;  . ESOPHAGOGASTRODUODENOSCOPY  10/14/10  . EXTRACORPOREAL SHOCK WAVE LITHOTRIPSY  multiple  . PERCUTANEOUS NEPHROSTOLITHOTOMY  2003  . PLANTAR FASCIA SURGERY Bilateral right 2008//  left 2010  . STONE EXTRACTION WITH BASKET Left 04/15/2016   Procedure: STONE EXTRACTION WITH BASKET;  Surgeon: Cleon Gustin, MD;  Location: [facility];  Service: Urology;  Laterality: Left;    There were no vitals filed for this visit.   Subjective Assessment - 06/23/17 1326    Subjective  PT states that she is here for an FCE as her disability is being reviewed.     Pertinent History  [place], [place], [address] Phone: [phone]   Fax:  [phone]  Name: Bethany King MRN: [id number] Date of Birth: [birth year]  [place], [place], [address] Phone: [phone]   Fax:  [phone]  Name: Bethany King MRN: [id number] Date of Birth: [birth year]  [facility] [place], [place], [address] Phone: [phone]   Fax:  [phone]  Physical Therapy Evaluation  Patient Details  Name: Bethany King MRN: [id number] Date of Birth: 02/03/1971 No Data Recorded  Encounter Date: 06/23/2017    Past Medical History:  Diagnosis Date  . Allergic rhinitis   . Anxiety   . Arthritis   . Bladder pain   . Complication of anesthesia   . Costochondritis    hx  . DDD (degenerative disc disease), lumbar   . Depression   . GERD (gastroesophageal reflux disease)   . Hepatic hemangioma 2006   stable  . History of adenomatous polyp of colon   . History of colitis   . History of gastritis   . History of kidney stones   . HTN (hypertension)   . Irritable bowel syndrome (IBS)   . Medullary sponge kidney    left  . Migraine   . Nephrolithiasis    bilateral-- nonobstructive per CT  . PONV (postoperative nausea and vomiting)   . Spinal stenosis   . Urgency of urination     Past Surgical History:  Procedure Laterality Date  . ANTERIOR CERVICAL DECOMP/DISCECTOMY FUSION  02/26/2014   C5 - C6; fusion with plate  . BACK SURGERY    . CARPAL TUNNEL RELEASE Bilateral right 04-01-2010/  left 05-30-2010  . CHOLECYSTECTOMY N/A 07/14/2013   Procedure: LAPAROSCOPIC CHOLECYSTECTOMY;  Surgeon: Jamesetta So, MD;  Location: [facility];  Service: General;  Laterality: N/A;  . COLONOSCOPY  10-13-2010  . CYSTO WITH HYDRODISTENSION N/A 04/02/2015   Procedure: CYSTOSCOPY/HYDRODISTENSION, BLADDER BIOPSY WITH FULGURATION;  Surgeon: Irine Seal, MD;  Location: [facility];  Service: Urology;  Laterality: N/A;  . CYSTOSCOPY W/ URETERAL STENT PLACEMENT Bilateral 04/08/2016   Procedure: CYSTOSCOPY WITH BILATERAL RETROGRADE PYELOGRAM, BILATERAL URETERAL STENT PLACEMENT;  Surgeon: Cleon Gustin, MD;  Location: [facility];  Service: Urology;  Laterality: Bilateral;  . CYSTOSCOPY W/ URETERAL STENT  PLACEMENT Left 04/15/2016   Procedure: CYSTOSCOPY WITH RETROGRADE PYELOGRAM/URETERAL STENT PLACEMENT;  Surgeon: Cleon Gustin, MD;  Location: [facility];  Service: Urology;  Laterality: Left;  . CYSTOSCOPY WITH HOLMIUM LASER LITHOTRIPSY Right 04/08/2016   Procedure: CYSTOSCOPY WITH RIGHT RENAL STONE EXTRACTION WITH HOLMIUM LASER LITHOTRIPSY;  Surgeon: Cleon Gustin, MD;  Location: [facility];  Service: Urology;  Laterality: Right;  . CYSTOSCOPY WITH HOLMIUM LASER LITHOTRIPSY Left 04/15/2016   Procedure: CYSTOSCOPY WITH HOLMIUM LASER LITHOTRIPSY;  Surgeon: Cleon Gustin, MD;  Location: [facility];  Service: Urology;  Laterality: Left;  . DIAGNOSTIC LAPAROSCOPY    . DILATION AND CURETTAGE OF UTERUS    . DILITATION & CURRETTAGE/HYSTROSCOPY WITH THERMACHOICE ABLATION N/A 09/07/2012   Procedure: DILATATION & CURETTAGE/HYSTEROSCOPY WITH THERMACHOICE ABLATION;  Surgeon: Florian Buff, MD;  Location: [facility];  Service: Gynecology;  Laterality: N/A;  . ESOPHAGOGASTRODUODENOSCOPY  10/14/10  . EXTRACORPOREAL SHOCK WAVE LITHOTRIPSY  multiple  . PERCUTANEOUS NEPHROSTOLITHOTOMY  2003  . PLANTAR FASCIA SURGERY Bilateral right 2008//  left 2010  . STONE EXTRACTION WITH BASKET Left 04/15/2016   Procedure: STONE EXTRACTION WITH BASKET;  Surgeon: Cleon Gustin, MD;  Location: [facility];  Service: Urology;  Laterality: Left;    There were no vitals filed for this visit.   Subjective Assessment - 06/23/17 1326    Subjective  PT states that she is here for an FCE as her disability is being reviewed.     Pertinent History  [place], [place], [address] Phone: [phone]   Fax:  [phone]  Name: Bethany King MRN: [id number] Date of Birth: [birth year]  [facility] [place], [place], [address] Phone: [phone]   Fax:  [phone]  Physical Therapy Evaluation  Patient Details  Name: Bethany King MRN: [id number] Date of Birth: 02/03/1971 No Data Recorded  Encounter Date: 06/23/2017    Past Medical History:  Diagnosis Date  . Allergic rhinitis   . Anxiety   . Arthritis   . Bladder pain   . Complication of anesthesia   . Costochondritis    hx  . DDD (degenerative disc disease), lumbar   . Depression   . GERD (gastroesophageal reflux disease)   . Hepatic hemangioma 2006   stable  . History of adenomatous polyp of colon   . History of colitis   . History of gastritis   . History of kidney stones   . HTN (hypertension)   . Irritable bowel syndrome (IBS)   . Medullary sponge kidney    left  . Migraine   . Nephrolithiasis    bilateral-- nonobstructive per CT  . PONV (postoperative nausea and vomiting)   . Spinal stenosis   . Urgency of urination     Past Surgical History:  Procedure Laterality Date  . ANTERIOR CERVICAL DECOMP/DISCECTOMY FUSION  02/26/2014   C5 - C6; fusion with plate  . BACK SURGERY    . CARPAL TUNNEL RELEASE Bilateral right 04-01-2010/  left 05-30-2010  . CHOLECYSTECTOMY N/A 07/14/2013   Procedure: LAPAROSCOPIC CHOLECYSTECTOMY;  Surgeon: Jamesetta So, MD;  Location: [facility];  Service: General;  Laterality: N/A;  . COLONOSCOPY  10-13-2010  . CYSTO WITH HYDRODISTENSION N/A 04/02/2015   Procedure: CYSTOSCOPY/HYDRODISTENSION, BLADDER BIOPSY WITH FULGURATION;  Surgeon: Irine Seal, MD;  Location: [facility];  Service: Urology;  Laterality: N/A;  . CYSTOSCOPY W/ URETERAL STENT PLACEMENT Bilateral 04/08/2016   Procedure: CYSTOSCOPY WITH BILATERAL RETROGRADE PYELOGRAM, BILATERAL URETERAL STENT PLACEMENT;  Surgeon: Cleon Gustin, MD;  Location: [facility];  Service: Urology;  Laterality: Bilateral;  . CYSTOSCOPY W/ URETERAL STENT  PLACEMENT Left 04/15/2016   Procedure: CYSTOSCOPY WITH RETROGRADE PYELOGRAM/URETERAL STENT PLACEMENT;  Surgeon: Cleon Gustin, MD;  Location: [facility];  Service: Urology;  Laterality: Left;  . CYSTOSCOPY WITH HOLMIUM LASER LITHOTRIPSY Right 04/08/2016   Procedure: CYSTOSCOPY WITH RIGHT RENAL STONE EXTRACTION WITH HOLMIUM LASER LITHOTRIPSY;  Surgeon: Cleon Gustin, MD;  Location: [facility];  Service: Urology;  Laterality: Right;  . CYSTOSCOPY WITH HOLMIUM LASER LITHOTRIPSY Left 04/15/2016   Procedure: CYSTOSCOPY WITH HOLMIUM LASER LITHOTRIPSY;  Surgeon: Cleon Gustin, MD;  Location: [facility];  Service: Urology;  Laterality: Left;  . DIAGNOSTIC LAPAROSCOPY    . DILATION AND CURETTAGE OF UTERUS    . DILITATION & CURRETTAGE/HYSTROSCOPY WITH THERMACHOICE ABLATION N/A 09/07/2012   Procedure: DILATATION & CURETTAGE/HYSTEROSCOPY WITH THERMACHOICE ABLATION;  Surgeon: Florian Buff, MD;  Location: [facility];  Service: Gynecology;  Laterality: N/A;  . ESOPHAGOGASTRODUODENOSCOPY  10/14/10  . EXTRACORPOREAL SHOCK WAVE LITHOTRIPSY  multiple  . PERCUTANEOUS NEPHROSTOLITHOTOMY  2003  . PLANTAR FASCIA SURGERY Bilateral right 2008//  left 2010  . STONE EXTRACTION WITH BASKET Left 04/15/2016   Procedure: STONE EXTRACTION WITH BASKET;  Surgeon: Cleon Gustin, MD;  Location: [facility];  Service: Urology;  Laterality: Left;    There were no vitals filed for this visit.   Subjective Assessment - 06/23/17 1326    Subjective  PT states that she is here for an FCE as her disability is being reviewed.     Pertinent History  [place], [place], [address] Phone: [phone]   Fax:  [phone]  Name: Bethany King MRN: [id number] Date of Birth: [birth year]

## 2017-06-25 ENCOUNTER — Other Ambulatory Visit (HOSPITAL_COMMUNITY)
Admission: AD | Admit: 2017-06-25 | Discharge: 2017-06-25 | Disposition: A | Discharge status: Home / Self Care | Payer: Medicare Other | Source: Other Acute Inpatient Hospital | Attending: Urology | Admitting: Urology

## 2017-06-25 DIAGNOSIS — N3000 Acute cystitis without hematuria: Secondary | ICD-10-CM | POA: Insufficient documentation

## 2017-06-25 NOTE — Patient Instructions (Signed)
Bethany King  06/25/2017     @PREFPERIOPPHARMACY @   Your procedure is scheduled on  07/05/2017   Report to [facility] at  615   A.M.  Call this number if you have problems the morning of surgery:  [phone]   Remember:  Do not eat food or drink liquids after midnight.  Take these medicines the morning of surgery with A SIP OF WATER  Xanax, flexaril, cymbalta, norco, zofran.   Do not wear jewelry, make-up or nail polish.  Do not wear lotions, powders, or perfumes, or deodorant.  Do not shave 48 hours prior to surgery.  Men may shave face and neck.  Do not bring valuables to the hospital.  [facility] is not responsible for any belongings or valuables.  Contacts, dentures or bridgework may not be worn into surgery.  Leave your suitcase in the car.  After surgery it may be brought to your room.  For patients admitted to the hospital, discharge time will be determined by your treatment team.  Patients discharged the day of surgery will not be allowed to drive home.   Name and phone number of your driver:   family Special instructions:  None  Please read over the following fact sheets that you were given. Anesthesia Post-op Instructions and Care and Recovery After Surgery       Cystoscopy Cystoscopy is a procedure that is used to help diagnose and sometimes treat conditions that affect that lower urinary tract. The lower urinary tract includes the bladder and the tube that drains urine from the bladder out of the body (urethra). Cystoscopy is performed with a thin, tube-shaped instrument with a light and camera at the end (cystoscope). The cystoscope may be hard (rigid) or flexible, depending on the goal of the procedure.The cystoscope is inserted through the urethra, into the bladder. Cystoscopy may be recommended if you have:  Urinary tractinfections that keep coming back (recurring).  Blood in the urine (hematuria).  Loss of bladder  control (urinary incontinence) or an overactive bladder.  Unusual cells found in a urine sample.  A blockage in the urethra.  Painful urination.  An abnormality in the bladder found during an intravenous pyelogram (IVP) or CT scan.  Cystoscopy may also be done to remove a sample of tissue to be examined under a microscope (biopsy). Tell a health care provider about:  Any allergies you have.  All medicines you are taking, including vitamins, herbs, eye drops, creams, and over-the-counter medicines.  Any problems you or family members have had with anesthetic medicines.  Any blood disorders you have.  Any surgeries you have had.  Any medical conditions you have.  Whether you are pregnant or may be pregnant. What are the risks? Generally, this is a safe procedure. However, problems may occur, including:  Infection.  Bleeding.  Allergic reactions to medicines.  Damage to other structures or organs.  What happens before the procedure?  Ask your health care provider about: ? Changing or stopping your regular medicines. This is especially important if you are taking diabetes medicines or blood thinners. ? Taking medicines such as aspirin and ibuprofen. These medicines can thin your blood. Do not take these medicines before your procedure if your health care provider instructs you not to.  Follow instructions from your health care provider about eating or drinking restrictions.  You may be given antibiotic medicine to help prevent infection.  You may have an exam or testing, such as X-rays of the bladder, urethra, or kidneys.  You may have urine tests to check for signs of infection.  Plan to have someone take you home after the procedure. What happens during the procedure?  To reduce your risk of infection,your health care team will wash or sanitize their hands.  You will be given one or more of the following: ? A medicine to help you relax (sedative). ? A medicine  to numb the area (local anesthetic).  The area around the opening of your urethra will be cleaned.  The cystoscope will be passed through your urethra into your bladder.  Germ-free (sterile)fluid will flow through the cystoscope to fill your bladder. The fluid will stretch your bladder so that your surgeon can clearly examine your bladder walls.  The cystoscope will be removed and your bladder will be emptied. The procedure may vary among health care providers and hospitals. What happens after the procedure?  You may have some soreness or pain in your abdomen and urethra. Medicines will be available to help you.  You may have some blood in your urine.  Do not drive for 24 hours if you received a sedative. This information is not intended to replace advice given to you by your health care provider. Make sure you discuss any questions you have with your health care provider. Document Released: 05/29/2000 Document Revised: 10/10/2015 Document Reviewed: 04/18/2015 Elsevier Interactive Patient Education  2018 [employer].  Cystoscopy, Care After Refer to this sheet in the next few weeks. These instructions provide you with information about caring for yourself after your procedure. Your health care provider may also give you more specific instructions. Your treatment has been planned according to current medical practices, but problems sometimes occur. Call your health care provider if you have any problems or questions after your procedure. What can I expect after the procedure? After the procedure, it is common to have:  Mild pain when you urinate. Pain should stop within a few minutes after you urinate. This may last for up to 1 week.  A small amount of blood in your urine for several days.  Feeling like you need to urinate but producing only a small amount of urine.  Follow these instructions at home:  Medicines  Take over-the-counter and prescription medicines only as told by  your health care provider.  If you were prescribed an antibiotic medicine, take it as told by your health care provider. Do not stop taking the antibiotic even if you start to feel better. General instructions   Return to your normal activities as told by your health care provider. Ask your health care provider what activities are safe for you.  Do not drive for 24 hours if you received a sedative.  Watch for any blood in your urine. If the amount of blood in your urine increases, call your health care provider.  Follow instructions from your health care provider about eating or drinking restrictions.  If a tissue sample was removed for testing (biopsy) during your procedure, it is your responsibility to get your test results. Ask your health care provider or the department performing the test when your results will be ready.  Drink enough fluid to keep your urine clear or pale yellow.  Keep all follow-up visits as told by your health care provider. This is important. Contact a health care provider if:  You have pain that gets worse or does not get better with medicine,  especially pain when you urinate.  You have difficulty urinating. Get help right away if:  You have more blood in your urine.  You have blood clots in your urine.  You have abdominal pain.  You have a fever or chills.  You are unable to urinate. This information is not intended to replace advice given to you by your health care provider. Make sure you discuss any questions you have with your health care provider. Document Released: 12/19/2004 Document Revised: 11/07/2015 Document Reviewed: 04/18/2015 Elsevier Interactive Patient Education  2018 [employer].  Ureteral Stent Implantation Ureteral stent implantation is a procedure to insert (implant) a flexible, soft, plastic tube (stent) into a tube (ureter) that drains urine from the kidneys. The stent supports the ureter while it heals and helps to drain  urine from the kidneys. You may have a ureteral stent implanted after having a procedure to remove a blockage from the ureter (ureterolysis or pyeloplasty).You may also have a stent implanted to open the flow of urine when you have a blockage caused by a kidney stone, tumor, blood clot, or infection. You have two ureters, one on each side of the body. The ureters connect the kidneys to the organ that holds urine until it passes out of the body (bladder). The stent is placed so that one end is in the kidney, and one end is in the bladder. The stent is usually taken out after your ureter has healed. Depending on your condition, you may have a stent for just a few weeks, or you may have a long-term stent that will need to be replaced every few months. Tell a health care provider about:  Any allergies you have.  All medicines you are taking, including vitamins, herbs, eye drops, creams, and over-the-counter medicines.  Any problems you or family members have had with anesthetic medicines.  Any blood disorders you have.  Any surgeries you have had.  Any medical conditions you have.  Whether you are pregnant or may be pregnant. What are the risks? Generally, this is a safe procedure. However, problems may occur, including:  Infection.  Bleeding.  Allergic reactions to medicines.  Damage to other structures or organs. Tearing (perforation) of the ureter is possible.  Movement of the stent away from where it is placed during surgery (migration).  What happens before the procedure?  Ask your health care provider about: ? Changing or stopping your regular medicines. This is especially important if you are taking diabetes medicines or blood thinners. ? Taking medicines such as aspirin and ibuprofen. These medicines can thin your blood. Do not take these medicines before your procedure if your health care provider instructs you not to.  Follow instructions from your health care provider  about eating or drinking restrictions.  Do not drink alcohol and do not use any tobacco products before your procedure, as told by your health care provider.  You may be given antibiotic medicine to help prevent infection.  Plan to have someone take you home after the procedure.  If you go home right after the procedure, plan to have someone with you for 24 hours. What happens during the procedure?  An IV tube will be inserted into one of your veins.  You will be given a medicine to make you fall asleep (general anesthetic). You may also be given a medicine to help you relax (sedative).  A thin, tube-shaped instrument with a light and tiny camera at the end (cystoscope) will be inserted into your urethra.  The urethra is the tube that drains urine from the bladder out of the body. In men, the urethra opens at the end of the penis. In women, the urethra opens in front of the vaginal opening.  The cystoscope will be passed into your bladder.  A thin wire (guide wire) will be passed through your bladder and into your ureter. This is used to guide the stent into your ureter.  The stent will be inserted into your ureter.  The guide wire and the cystoscope will be removed.  A flexible tube (catheter) will be inserted through your urethra so that one end is in your bladder. This helps to drain urine from your bladder. The procedure may vary among hospitals and health care providers. What happens after the procedure?  Your blood pressure, heart rate, breathing rate, and blood oxygen level will be monitored often until the medicines you were given have worn off.  You may continue to receive medicine and fluids through an IV tube.  You may have some soreness or pain in your abdomen and urethra. Medicines will be available to help you.  You will be encouraged to get up and walk around as soon as you can.  You will have a catheter draining your urine.  You will have some blood in your  urine.  Do not drive for 24 hours if you received a sedative. This information is not intended to replace advice given to you by your health care provider. Make sure you discuss any questions you have with your health care provider. Document Released: 05/29/2000 Document Revised: 11/07/2015 Document Reviewed: 12/14/2014 Elsevier Interactive Patient Education  2018 [place].  Ureteral Stent Implantation, Care After Refer to this sheet in the next few weeks. These instructions provide you with information about caring for yourself after your procedure. Your health care provider may also give you more specific instructions. Your treatment has been planned according to current medical practices, but problems sometimes occur. Call your health care provider if you have any problems or questions after your procedure. What can I expect after the procedure? After the procedure, it is common to have:  Nausea.  Mild pain when you urinate. You may feel this pain in your lower back or lower abdomen. Pain should stop within a few minutes after you urinate. This may last for up to 1 week.  A small amount of blood in your urine for several days.  Follow these instructions at home:  Medicines  Take over-the-counter and prescription medicines only as told by your health care provider.  If you were prescribed an antibiotic medicine, take it as told by your health care provider. Do not stop taking the antibiotic even if you start to feel better.  Do not drive for 24 hours if you received a sedative.  Do not drive or operate heavy machinery while taking prescription pain medicines. Activity  Return to your normal activities as told by your health care provider. Ask your health care provider what activities are safe for you.  Do not lift anything that is heavier than 10 lb (4.5 kg). Follow this limit for 1 week after your procedure, or for as long as told by your health care provider. General  instructions  Watch for any blood in your urine. Call your health care provider if the amount of blood in your urine increases.  If you have a catheter: ? Follow instructions from your health care provider about taking care of your catheter and collection bag. ?  Do not take baths, swim, or use a hot tub until your health care provider approves.  Drink enough fluid to keep your urine clear or pale yellow.  Keep all follow-up visits as told by your health care provider. This is important. Contact a health care provider if:  You have pain that gets worse or does not get better with medicine, especially pain when you urinate.  You have difficulty urinating.  You feel nauseous or you vomit repeatedly during a period of more than 2 days after the procedure. Get help right away if:  Your urine is dark red or has blood clots in it.  You are leaking urine (have incontinence).  The end of the stent comes out of your urethra.  You cannot urinate.  You have sudden, sharp, or severe pain in your abdomen or lower back.  You have a fever. This information is not intended to replace advice given to you by your health care provider. Make sure you discuss any questions you have with your health care provider. Document Released: 02/01/2013 Document Revised: 11/07/2015 Document Reviewed: 12/14/2014 Elsevier Interactive Patient Education  [employer].  Ureteroscopy Ureteroscopy is a procedure to check for and treat problems inside part of the urinary tract. In this procedure, a thin, tube-shaped instrument with a light at the end (ureteroscope) is used to look at the inside of the kidneys and the ureters, which are the tubes that carry urine from the kidneys to the bladder. The ureteroscope is inserted into one or both of the ureters. You may need this procedure if you have frequent urinary tract infections (UTIs), blood in your urine, or a stone in one of your ureters. A ureteroscopy can be  done to find the cause of urine blockage in a ureter and to evaluate other abnormalities inside the ureters or kidneys. If stones are found, they can be removed during the procedure. Polyps, abnormal tissue, and some types of tumors can also be removed or treated. The ureteroscope may also have a tool to remove tissue to be checked for disease under a microscope (biopsy). Tell a health care provider about:  Any allergies you have.  All medicines you are taking, including vitamins, herbs, eye drops, creams, and over-the-counter medicines.  Any problems you or family members have had with anesthetic medicines.  Any blood disorders you have.  Any surgeries you have had.  Any medical conditions you have.  Whether you are pregnant or may be pregnant. What are the risks? Generally, this is a safe procedure. However, problems may occur, including:  Bleeding.  Infection.  Allergic reactions to medicines.  Scarring that narrows the ureter (stricture).  Creating a hole in the ureter (perforation).  What happens before the procedure? Staying hydrated Follow instructions from your health care provider about hydration, which may include:  Up to 2 hours before the procedure - you may continue to drink clear liquids, such as water, clear fruit juice, black coffee, and plain tea.  Eating and drinking restrictions Follow instructions from your health care provider about eating and drinking, which may include:  8 hours before the procedure - stop eating heavy meals or foods such as meat, fried foods, or fatty foods.  6 hours before the procedure - stop eating light meals or foods, such as toast or cereal.  6 hours before the procedure - stop drinking milk or drinks that contain milk.  2 hours before the procedure - stop drinking clear liquids.  Medicines  Ask your  health care provider about: ? Changing or stopping your regular medicines. This is especially important if you are taking  diabetes medicines or blood thinners. ? Taking medicines such as aspirin and ibuprofen. These medicines can thin your blood. Do not take these medicines before your procedure if your health care provider instructs you not to.  You may be given antibiotic medicine to help prevent infection. General instructions  You may have a urine sample taken to check for infection.  Plan to have someone take you home from the hospital or clinic. What happens during the procedure?  To reduce your risk of infection: ? Your health care team will wash or sanitize their hands. ? Your skin will be washed with soap.  An IV tube will be inserted into one of your veins.  You will be given one of the following: ? A medicine to help you relax (sedative). ? A medicine to make you fall asleep (general anesthetic). ? A medicine that is injected into your spine to numb the area below and slightly above the injection site (spinal anesthetic).  To lower your risk of infection, you may be given an antibiotic medicine by an injection or through the IV tube.  The opening from which you urinate (urethra) will be cleaned with a germ-killing solution.  The ureteroscope will be passed through your urethra into your bladder.  A salt-water solution will flow through the ureteroscope to fill your bladder. This will help the health care provider see the openings of your ureters more clearly.  Then, the ureteroscope will be passed into your ureter. ? If a growth is found, a piece of it may be removed so it can be examined under a microscope (biopsy). ? If a stone is found, it may be removed through the ureteroscope, or the stone may be broken up using a laser, shock waves, or electrical energy. ? In some cases, if the ureter is too small, a tube may be inserted that keeps the ureter open (ureteral stent). The stent may be left in place for 1 or 2 weeks to keep the ureter open, and then the ureteroscopy procedure will be  performed.  The scope will be removed, and your bladder will be emptied. The procedure may vary among health care providers and hospitals. What happens after the procedure?  Your blood pressure, heart rate, breathing rate, and blood oxygen level will be monitored until the medicines you were given have worn off.  You may be asked to urinate.  Donot drive for 24 hours if you were given a sedative. This information is not intended to replace advice given to you by your health care provider. Make sure you discuss any questions you have with your health care provider. Document Released: 06/06/2013 Document Revised: 03/17/2016 Document Reviewed: 03/13/2016 Elsevier Interactive Patient Education  2018 [employer].  Ureteroscopy, Care After This sheet gives you information about how to care for yourself after your procedure. Your health care provider may also give you more specific instructions. If you have problems or questions, contact your health care provider. What can I expect after the procedure? After the procedure, it is common to have:  A burning sensation when you urinate.  Blood in your urine.  Mild discomfort in the bladder area or kidney area when urinating.  Needing to urinate more often or urgently.  Follow these instructions at home: Medicines  Take over-the-counter and prescription medicines only as told by your health care provider.  If you were prescribed  an antibiotic medicine, take it as told by your health care provider. Do not stop taking the antibiotic even if you start to feel better. General instructions   Donot drive for 24 hours if you were given a medicine to help you relax (sedative) during your procedure.  To relieve burning, try taking a warm bath or holding a warm washcloth over your groin.  Drink enough fluid to keep your urine clear or pale yellow. ? Drink two 8-ounce glasses of water every hour for the first 2 hours after you get  home. ? Continue to drink water often at home.  You can eat what you usually do.  Keep all follow-up visits as told by your health care provider. This is important. ? If you had a tube placed to keep urine flowing (ureteral stent), ask your health care provider when you need to return to have it removed. Contact a health care provider if:  You have chills or a fever.  You have burning pain for longer than 24 hours after the procedure.  You have blood in your urine for longer than 24 hours after the procedure. Get help right away if:  You have large amounts of blood in your urine.  You have blood clots in your urine.  You have very bad pain.  You have chest pain or trouble breathing.  You are unable to urinate and you have the feeling of a full bladder. This information is not intended to replace advice given to you by your health care provider. Make sure you discuss any questions you have with your health care provider. Document Released: 06/06/2013 Document Revised: 03/17/2016 Document Reviewed: 03/13/2016 Elsevier Interactive Patient Education  2018 [place] Anesthesia, Adult General anesthesia is the use of medicines to make a person "go to sleep" (be unconscious) for a medical procedure. General anesthesia is often recommended when a procedure:  Is long.  Requires you to be still or in an unusual position.  Is major and can cause you to lose blood.  Is impossible to do without general anesthesia.  The medicines used for general anesthesia are called general anesthetics. In addition to making you sleep, the medicines:  Prevent pain.  Control your blood pressure.  Relax your muscles.  Tell a health care provider about:  Any allergies you have.  All medicines you are taking, including vitamins, herbs, eye drops, creams, and over-the-counter medicines.  Any problems you or family members have had with anesthetic medicines.  Types of anesthetics you  have had in the past.  Any bleeding disorders you have.  Any surgeries you have had.  Any medical conditions you have.  Any history of heart or lung conditions, such as heart failure, sleep apnea, or chronic obstructive pulmonary disease (COPD).  Whether you are pregnant or may be pregnant.  Whether you use tobacco, alcohol, marijuana, or street drugs.  Any history of Armed forces logistics/support/administrative officer.  Any history of depression or anxiety. What are the risks? Generally, this is a safe procedure. However, problems may occur, including:  Allergic reaction to anesthetics.  Lung and heart problems.  Inhaling food or liquids from your stomach into your lungs (aspiration).  Injury to nerves.  Waking up during your procedure and being unable to move (rare).  Extreme agitation or a state of mental confusion (delirium) when you wake up from the anesthetic.  Air in the bloodstream, which can lead to stroke.  These problems are more likely to develop if you are having a  major surgery or if you have an advanced medical condition. You can prevent some of these complications by answering all of your health care provider's questions thoroughly and by following all pre-procedure instructions. General anesthesia can cause side effects, including:  Nausea or vomiting  A sore throat from the breathing tube.  Feeling cold or shivery.  Feeling tired, washed out, or achy.  Sleepiness or drowsiness.  Confusion or agitation.  What happens before the procedure? Staying hydrated Follow instructions from your health care provider about hydration, which may include:  Up to 2 hours before the procedure - you may continue to drink clear liquids, such as water, clear fruit juice, black coffee, and plain tea.  Eating and drinking restrictions Follow instructions from your health care provider about eating and drinking, which may include:  8 hours before the procedure - stop eating heavy meals or foods such  as meat, fried foods, or fatty foods.  6 hours before the procedure - stop eating light meals or foods, such as toast or cereal.  6 hours before the procedure - stop drinking milk or drinks that contain milk.  2 hours before the procedure - stop drinking clear liquids.  Medicines  Ask your health care provider about: ? Changing or stopping your regular medicines. This is especially important if you are taking diabetes medicines or blood thinners. ? Taking medicines such as aspirin and ibuprofen. These medicines can thin your blood. Do not take these medicines before your procedure if your health care provider instructs you not to. ? Taking new dietary supplements or medicines. Do not take these during the week before your procedure unless your health care provider approves them.  If you are told to take a medicine or to continue taking a medicine on the day of the procedure, take the medicine with sips of water. General instructions   Ask if you will be going home the same day, the following day, or after a longer hospital stay. ? Plan to have someone take you home. ? Plan to have someone stay with you for the first 24 hours after you leave the hospital or clinic.  For 3-6 weeks before the procedure, try not to use any tobacco products, such as cigarettes, chewing tobacco, and e-cigarettes.  You may brush your teeth on the morning of the procedure, but make sure to spit out the toothpaste. What happens during the procedure?  You will be given anesthetics through a mask and through an IV tube in one of your veins.  You may receive medicine to help you relax (sedative).  As soon as you are asleep, a breathing tube may be used to help you breathe.  An anesthesia specialist will stay with you throughout the procedure. He or she will help keep you comfortable and safe by continuing to give you medicines and adjusting the amount of medicine that you get. He or she will also watch your  blood pressure, pulse, and oxygen levels to make sure that the anesthetics do not cause any problems.  If a breathing tube was used to help you breathe, it will be removed before you wake up. The procedure may vary among health care providers and hospitals. What happens after the procedure?  You will wake up, often slowly, after the procedure is complete, usually in a recovery area.  Your blood pressure, heart rate, breathing rate, and blood oxygen level will be monitored until the medicines you were given have worn off.  You may be given  medicine to help you calm down if you feel anxious or agitated.  If you will be going home the same day, your health care provider may check to make sure you can stand, drink, and urinate.  Your health care providers will treat your pain and side effects before you go home.  Do not drive for 24 hours if you received a sedative.  You may: ? Feel nauseous and vomit. ? Have a sore throat. ? Have mental slowness. ? Feel cold or shivery. ? Feel sleepy. ? Feel tired. ? Feel sore or achy, even in parts of your body where you did not have surgery. This information is not intended to replace advice given to you by your health care provider. Make sure you discuss any questions you have with your health care provider. Document Released: 09/08/2007 Document Revised: 11/12/2015 Document Reviewed: 05/16/2015 Elsevier Interactive Patient Education  [employer].

## 2017-06-25 NOTE — Addendum Note (Signed)
Addended by: Leeroy Cha on: 06/25/2017 08:46 AM   Modules accepted: Orders

## 2017-06-27 LAB — URINE CULTURE

## 2017-06-29 ENCOUNTER — Encounter (HOSPITAL_COMMUNITY): Payer: Self-pay

## 2017-06-29 ENCOUNTER — Encounter (HOSPITAL_COMMUNITY)
Admission: RE | Admit: 2017-06-29 | Discharge: 2017-06-29 | Disposition: A | Discharge status: Home / Self Care | Payer: Medicare Other | Source: Ambulatory Visit | Attending: Urology | Admitting: Urology

## 2017-06-29 DIAGNOSIS — N3000 Acute cystitis without hematuria: Secondary | ICD-10-CM | POA: Insufficient documentation

## 2017-06-29 DIAGNOSIS — R9431 Abnormal electrocardiogram [ECG] [EKG]: Secondary | ICD-10-CM | POA: Diagnosis not present

## 2017-06-29 DIAGNOSIS — Z01818 Encounter for other preprocedural examination: Secondary | ICD-10-CM | POA: Insufficient documentation

## 2017-06-29 DIAGNOSIS — Z01810 Encounter for preprocedural cardiovascular examination: Secondary | ICD-10-CM | POA: Diagnosis not present

## 2017-06-29 LAB — CBC WITH DIFFERENTIAL/PLATELET
Basophils Absolute: 0 10*3/uL (ref 0.0–0.1)
Basophils Relative: 0 %
EOS ABS: 0.1 10*3/uL (ref 0.0–0.7)
EOS PCT: 2 %
HCT: 36.2 % (ref 36.0–46.0)
Hemoglobin: 11.7 g/dL — ABNORMAL LOW (ref 12.0–15.0)
LYMPHS ABS: 1.7 10*3/uL (ref 0.7–4.0)
Lymphocytes Relative: 29 %
MCH: 29.6 pg (ref 26.0–34.0)
MCHC: 32.3 g/dL (ref 30.0–36.0)
MCV: 91.6 fL (ref 78.0–100.0)
MONO ABS: 0.6 10*3/uL (ref 0.1–1.0)
MONOS PCT: 10 %
Neutro Abs: 3.5 10*3/uL (ref 1.7–7.7)
Neutrophils Relative %: 59 %
PLATELETS: 275 10*3/uL (ref 150–400)
RBC: 3.95 MIL/uL (ref 3.87–5.11)
RDW: 13.6 % (ref 11.5–15.5)
WBC: 5.8 10*3/uL (ref 4.0–10.5)

## 2017-06-29 LAB — BASIC METABOLIC PANEL
Anion gap: 7 (ref 5–15)
BUN: 13 mg/dL (ref 6–20)
CHLORIDE: 101 mmol/L (ref 101–111)
CO2: 29 mmol/L (ref 22–32)
CREATININE: 0.73 mg/dL (ref 0.44–1.00)
Calcium: 9.1 mg/dL (ref 8.9–10.3)
GFR calc Af Amer: >60 mL/min (ref >60)
Glucose, Bld: 88 mg/dL (ref 65–99)
Potassium: 2.9 mmol/L — ABNORMAL LOW (ref 3.5–5.1)
SODIUM: 137 mmol/L (ref 135–145)

## 2017-06-29 LAB — HCG, SERUM, QUALITATIVE: PREG SERUM: NEGATIVE

## 2017-06-29 NOTE — Pre-Procedure Instructions (Signed)
Potassium of 2.9 shown to Dr Patsey Berthold. Per his order, patient called and instructed to take 40 mEq  Of potassium BID with meals. Left number for her to call us back if she has any questions or concerns.

## 2017-07-02 ENCOUNTER — Other Ambulatory Visit (HOSPITAL_COMMUNITY)
Admission: AD | Admit: 2017-07-02 | Discharge: 2017-07-02 | Disposition: A | Discharge status: Home / Self Care | Payer: Medicare Other | Attending: Urology | Admitting: Urology

## 2017-07-02 DIAGNOSIS — R9431 Abnormal electrocardiogram [ECG] [EKG]: Secondary | ICD-10-CM | POA: Diagnosis not present

## 2017-07-02 DIAGNOSIS — N3000 Acute cystitis without hematuria: Secondary | ICD-10-CM | POA: Diagnosis not present

## 2017-07-02 DIAGNOSIS — Z01810 Encounter for preprocedural cardiovascular examination: Secondary | ICD-10-CM | POA: Diagnosis not present

## 2017-07-02 DIAGNOSIS — Z01818 Encounter for other preprocedural examination: Secondary | ICD-10-CM | POA: Diagnosis not present

## 2017-07-04 LAB — URINE CULTURE: Culture: NO GROWTH

## 2017-07-05 ENCOUNTER — Ambulatory Visit (HOSPITAL_COMMUNITY): Payer: Medicare Other | Admitting: Anesthesiology

## 2017-07-05 ENCOUNTER — Encounter (HOSPITAL_COMMUNITY)
Admission: RE | Disposition: A | Discharge status: Home / Self Care | Payer: Self-pay | Source: Ambulatory Visit | Attending: Urology

## 2017-07-05 ENCOUNTER — Ambulatory Visit (HOSPITAL_COMMUNITY): Payer: Medicare Other

## 2017-07-05 ENCOUNTER — Ambulatory Visit (HOSPITAL_COMMUNITY)
Admission: RE | Admit: 2017-07-05 | Discharge: 2017-07-05 | Disposition: A | Discharge status: Home / Self Care | Payer: Medicare Other | Source: Ambulatory Visit | Attending: Urology | Admitting: Urology

## 2017-07-05 ENCOUNTER — Encounter (HOSPITAL_COMMUNITY): Payer: Self-pay | Admitting: *Deleted

## 2017-07-05 DIAGNOSIS — N201 Calculus of ureter: Secondary | ICD-10-CM | POA: Diagnosis not present

## 2017-07-05 DIAGNOSIS — F419 Anxiety disorder, unspecified: Secondary | ICD-10-CM | POA: Diagnosis not present

## 2017-07-05 DIAGNOSIS — Z87442 Personal history of urinary calculi: Secondary | ICD-10-CM | POA: Diagnosis not present

## 2017-07-05 DIAGNOSIS — Z79899 Other long term (current) drug therapy: Secondary | ICD-10-CM | POA: Insufficient documentation

## 2017-07-05 DIAGNOSIS — I10 Essential (primary) hypertension: Secondary | ICD-10-CM | POA: Insufficient documentation

## 2017-07-05 DIAGNOSIS — K219 Gastro-esophageal reflux disease without esophagitis: Secondary | ICD-10-CM | POA: Insufficient documentation

## 2017-07-05 DIAGNOSIS — N200 Calculus of kidney: Secondary | ICD-10-CM | POA: Diagnosis not present

## 2017-07-05 DIAGNOSIS — Z885 Allergy status to narcotic agent status: Secondary | ICD-10-CM | POA: Diagnosis not present

## 2017-07-05 DIAGNOSIS — F329 Major depressive disorder, single episode, unspecified: Secondary | ICD-10-CM | POA: Diagnosis not present

## 2017-07-05 DIAGNOSIS — Q615 Medullary cystic kidney: Secondary | ICD-10-CM | POA: Insufficient documentation

## 2017-07-05 HISTORY — PX: CYSTOSCOPY WITH RETROGRADE PYELOGRAM, URETEROSCOPY AND STENT PLACEMENT: SHX5789

## 2017-07-05 HISTORY — PX: HOLMIUM LASER APPLICATION: SHX5852

## 2017-07-05 SURGERY — CYSTOURETEROSCOPY, WITH RETROGRADE PYELOGRAM AND STENT INSERTION
Anesthesia: General | Site: Ureter | Laterality: Left

## 2017-07-05 MED ORDER — LACTATED RINGERS IV SOLN
INTRAVENOUS | Status: DC
  Administered 2017-07-05 (×2): via INTRAVENOUS

## 2017-07-05 MED ORDER — PROPOFOL 10 MG/ML IV BOLUS
INTRAVENOUS | Status: DC | PRN
  Administered 2017-07-05: 30 mg via INTRAVENOUS
  Administered 2017-07-05: 200 mg via INTRAVENOUS

## 2017-07-05 MED ORDER — SODIUM CHLORIDE 0.9 % IR SOLN
Status: DC | PRN
  Administered 2017-07-05 (×2): 3000 mL

## 2017-07-05 MED ORDER — MIDAZOLAM HCL 2 MG/2ML IJ SOLN
INTRAMUSCULAR | Status: AC
  Filled 2017-07-05: qty 2

## 2017-07-05 MED ORDER — CEPHALEXIN 250 MG PO CAPS: 250.0000 mg | ORAL_CAPSULE | Freq: Two times a day (BID) | ORAL | 0 refills | Status: AC

## 2017-07-05 MED ORDER — FENTANYL CITRATE (PF) 100 MCG/2ML IJ SOLN: 25.0000 ug | INTRAMUSCULAR | Status: DC | PRN

## 2017-07-05 MED ORDER — SCOPOLAMINE 1 MG/3DAYS TD PT72
1.0000 | MEDICATED_PATCH | Freq: Once | TRANSDERMAL | Status: DC
  Administered 2017-07-05: 1.5 mg via TRANSDERMAL

## 2017-07-05 MED ORDER — FENTANYL CITRATE (PF) 100 MCG/2ML IJ SOLN
INTRAMUSCULAR | Status: AC
  Filled 2017-07-05: qty 4

## 2017-07-05 MED ORDER — CEFAZOLIN SODIUM-DEXTROSE 2-4 GM/100ML-% IV SOLN
2.0000 g | INTRAVENOUS | Status: AC
  Administered 2017-07-05: 2000 mg via INTRAVENOUS

## 2017-07-05 MED ORDER — DIATRIZOATE MEGLUMINE 30 % UR SOLN
URETHRAL | Status: AC
  Filled 2017-07-05: qty 100

## 2017-07-05 MED ORDER — FENTANYL CITRATE (PF) 100 MCG/2ML IJ SOLN
INTRAMUSCULAR | Status: DC | PRN
  Administered 2017-07-05 (×3): 25 ug via INTRAVENOUS

## 2017-07-05 MED ORDER — SUCCINYLCHOLINE CHLORIDE 20 MG/ML IJ SOLN
INTRAMUSCULAR | Status: AC
Start: 2017-07-05 — End: 2017-07-05
  Filled 2017-07-05: qty 1

## 2017-07-05 MED ORDER — DEXAMETHASONE SODIUM PHOSPHATE 4 MG/ML IJ SOLN
INTRAMUSCULAR | Status: AC
  Filled 2017-07-05: qty 1

## 2017-07-05 MED ORDER — LIDOCAINE HCL (PF) 1 % IJ SOLN
INTRAMUSCULAR | Status: AC
  Filled 2017-07-05: qty 5

## 2017-07-05 MED ORDER — ONDANSETRON HCL 4 MG/2ML IJ SOLN
4.0000 mg | Freq: Once | INTRAMUSCULAR | Status: AC
  Administered 2017-07-05: 4 mg via INTRAVENOUS

## 2017-07-05 MED ORDER — PHENAZOPYRIDINE HCL 200 MG PO TABS: 200.0000 mg | ORAL_TABLET | Freq: Three times a day (TID) | ORAL | 0 refills | Status: AC | PRN

## 2017-07-05 MED ORDER — MIDAZOLAM HCL 5 MG/5ML IJ SOLN
INTRAMUSCULAR | Status: DC | PRN
  Administered 2017-07-05: 2 mg via INTRAVENOUS

## 2017-07-05 MED ORDER — PROPOFOL 10 MG/ML IV BOLUS
INTRAVENOUS | Status: AC
  Filled 2017-07-05: qty 40

## 2017-07-05 MED ORDER — EPHEDRINE SULFATE 50 MG/ML IJ SOLN
INTRAMUSCULAR | Status: AC
  Filled 2017-07-05: qty 1

## 2017-07-05 MED ORDER — SODIUM CHLORIDE 0.9 % IJ SOLN
INTRAMUSCULAR | Status: AC
Start: 2017-07-05 — End: 2017-07-05
  Filled 2017-07-05: qty 10

## 2017-07-05 MED ORDER — ONDANSETRON HCL 4 MG/2ML IJ SOLN
INTRAMUSCULAR | Status: AC
  Filled 2017-07-05: qty 2

## 2017-07-05 MED ORDER — HYDROCODONE-ACETAMINOPHEN 10-325 MG PO TABS: 1.0000 | ORAL_TABLET | ORAL | 0 refills | Status: DC | PRN

## 2017-07-05 MED ORDER — LIDOCAINE HCL 1 % IJ SOLN
INTRAMUSCULAR | Status: DC | PRN
  Administered 2017-07-05: 30 mg via INTRADERMAL

## 2017-07-05 MED ORDER — WATER FOR IRRIGATION, STERILE IR SOLN
Status: DC | PRN
  Administered 2017-07-05: 1000 mL

## 2017-07-05 MED ORDER — SCOPOLAMINE 1 MG/3DAYS TD PT72
MEDICATED_PATCH | TRANSDERMAL | Status: AC
  Filled 2017-07-05: qty 1

## 2017-07-05 MED ORDER — GLYCOPYRROLATE 0.2 MG/ML IJ SOLN
0.2000 mg | Freq: Once | INTRAMUSCULAR | Status: AC
  Administered 2017-07-05: 0.2 mg via INTRAVENOUS
  Filled 2017-07-05: qty 1

## 2017-07-05 MED ORDER — DIATRIZOATE MEGLUMINE 30 % UR SOLN
URETHRAL | Status: DC | PRN
  Administered 2017-07-05: 8 mL via URETHRAL

## 2017-07-05 MED ORDER — CEFAZOLIN SODIUM-DEXTROSE 2-4 GM/100ML-% IV SOLN
INTRAVENOUS | Status: AC
  Filled 2017-07-05: qty 100

## 2017-07-05 MED ORDER — MIDAZOLAM HCL 2 MG/2ML IJ SOLN
1.0000 mg | INTRAMUSCULAR | Status: AC
  Administered 2017-07-05: 2 mg via INTRAVENOUS

## 2017-07-05 MED ORDER — DEXAMETHASONE SODIUM PHOSPHATE 4 MG/ML IJ SOLN
4.0000 mg | Freq: Once | INTRAMUSCULAR | Status: AC
  Administered 2017-07-05: 4 mg via INTRAVENOUS

## 2017-07-05 SURGICAL SUPPLY — 27 items
BAG DRAIN URO TABLE W/ADPT NS (DRAPE) ×2 IMPLANT
BAG DRN 8 ADPR NS SKTRN CSTL (DRAPE) ×1
BAG HAMPER (MISCELLANEOUS) ×2 IMPLANT
CATH INTERMIT  6FR 70CM (CATHETERS) ×2 IMPLANT
CLOTH BEACON ORANGE TIMEOUT ST (SAFETY) ×2 IMPLANT
DECANTER SPIKE VIAL GLASS SM (MISCELLANEOUS) ×2 IMPLANT
EXTRACTOR STONE NITINOL NGAGE (UROLOGICAL SUPPLIES) ×2 IMPLANT
FIBER LASER FLEXIVA 200 (UROLOGICAL SUPPLIES) ×2 IMPLANT
GLOVE BIO SURGEON STRL SZ8 (GLOVE) ×2 IMPLANT
GLOVE BIOGEL PI IND STRL 7.0 (GLOVE) ×1 IMPLANT
GLOVE BIOGEL PI INDICATOR 7.0 (GLOVE) ×2
GLOVE ECLIPSE 6.5 STRL STRAW (GLOVE) ×1 IMPLANT
GOWN STRL REUS W/ TWL XL LVL3 (GOWN DISPOSABLE) ×1 IMPLANT
GOWN STRL REUS W/TWL LRG LVL3 (GOWN DISPOSABLE) ×2 IMPLANT
GOWN STRL REUS W/TWL XL LVL3 (GOWN DISPOSABLE) ×2
GUIDEWIRE STR DUAL SENSOR (WIRE) ×2 IMPLANT
GUIDEWIRE STR ZIPWIRE 035X150 (MISCELLANEOUS) ×2 IMPLANT
IV NS IRRIG 3000ML ARTHROMATIC (IV SOLUTION) ×4 IMPLANT
KIT ROOM TURNOVER AP CYSTO (KITS) ×2 IMPLANT
MANIFOLD NEPTUNE II (INSTRUMENTS) ×2 IMPLANT
PACK CYSTO (CUSTOM PROCEDURE TRAY) ×2 IMPLANT
PAD ARMBOARD 7.5X6 YLW CONV (MISCELLANEOUS) ×2 IMPLANT
SHEATH ACCESS URETERAL 38CM (SHEATH) ×1 IMPLANT
STENT URET 6FRX26 CONTOUR (STENTS) ×1 IMPLANT
SYRINGE 10CC LL (SYRINGE) ×2 IMPLANT
TOWEL OR 17X26 4PK STRL BLUE (TOWEL DISPOSABLE) ×2 IMPLANT
WATER STERILE IRR 500ML POUR (IV SOLUTION) ×2 IMPLANT

## 2017-07-05 NOTE — Transfer of Care (Signed)
Immediate Anesthesia Transfer of Care Note  Patient: Bethany King  Procedure(s) Performed: CYSTOSCOPY WITH RETROGRADE PYELOGRAM, URETEROSCOPY AND STENT PLACEMENT (Left Ureter) HOLMIUM LASER APPLICATION (Left Ureter)  Patient Location: PACU  Anesthesia Type:General  Level of Consciousness: awake and patient cooperative  Airway & Oxygen Therapy: Patient Spontanous Breathing and Patient connected to face mask oxygen  Post-op Assessment: Report given to RN, Post -op Vital signs reviewed and stable and Patient moving all extremities  Post vital signs: Reviewed and stable  Last Vitals:  Vitals:   07/05/17 0648 07/05/17 0741  BP: 113/76   Pulse: 80   Resp: 18 (!) 21  Temp: 36.7 C   SpO2: 98% 99%    Last Pain:  Vitals:   07/05/17 0648  TempSrc: Oral  PainSc: 5       Patients Stated Pain Goal: 5 (24/23/53 6144)  Complications: No apparent anesthesia complications

## 2017-07-05 NOTE — Discharge Instructions (Signed)

## 2017-07-05 NOTE — Op Note (Signed)
.  Preoperative diagnosis: Left ureteral stone  Postoperative diagnosis: Same  Procedure: 1 cystoscopy 2. Left retrograde pyelography 3.  Intraoperative fluoroscopy, under one hour, with interpretation 4.  Left ureteroscopic stone manipulation with laser lithotripsy 5.  Left 6 x 26 JJ stent placement  Attending: Rosie Fate  Anesthesia: General  Estimated blood loss: None  Drains: Left 6 x 26 JJ ureteral stent with tether  Specimens: stone for analysis  Antibiotics: ancef  Findings: left lower pole calculi. No hydronephrosis. No masses/lesions in the bladder. Ureteral orifices in normal anatomic location.  Indications: Patient is a 47 year old female with a history of left renal stone and who has persistent left flank pain.  After discussing treatment options, she decided proceed with left ureteroscopic stone manipulation.  Procedure her in detail: The patient was brought to the operating room and a brief timeout was done to ensure correct patient, correct procedure, correct site.  General anesthesia was administered patient was placed in dorsal lithotomy position.  Her genitalia was then prepped and draped in usual sterile fashion.  A rigid 10 French cystoscope was passed in the urethra and the bladder.  Bladder was inspected free masses or lesions.  the ureteral orifices were in the normal orthotopic locations.  a 6 french ureteral catheter was then instilled into the left ureteral orifice.  a gentle retrograde was obtained and findings noted above.  we then placed a zip wire through the ureteral catheter and advanced up to the renal pelvis.  we then removed the cystoscope and cannulated the left ureteral orifice with a semirigid ureteroscope.  No stone was found in the ureter. Once we reached the UPJ a sensor wire was advanced in to the renal pelvis. We then removed the ureteroscope and advanced am 12/14 x 35cm access sheath up to the renal pelvis. We then used the flexible  ureteroscope to perform nephroscopy. We encountered the stone in the lower pole.    the pieces were then removed with a Ngage basket.    once all stone fragments were removed we then removed the access sheath under direct vision and noted no injury to the ureter. We then placed a 6 x 26 double-j ureteral stent over the original zip wire.  We then removed the wire and good coil was noted in the the renal pelvis under fluoroscopy and the bladder under direct vision. the bladder was then drained and this concluded the procedure which was well tolerated by patient.  Complications: None  Condition: Stable, extubated, transferred to PACU  Plan: Patient is to be discharged home as to follow-up in one week. She is to remove her stent by pulling the tether in 72 hours

## 2017-07-05 NOTE — Anesthesia Procedure Notes (Signed)
Procedure Name: LMA Insertion Date/Time: 07/05/2017 7:54 AM Performed by: Charmaine Downs, CRNA Pre-anesthesia Checklist: Emergency Drugs available, Patient identified, Suction available and Patient being monitored Patient Re-evaluated:Patient Re-evaluated prior to induction Oxygen Delivery Method: Circle system utilized Preoxygenation: Pre-oxygenation with 100% oxygen Induction Type: IV induction Ventilation: Mask ventilation without difficulty LMA: LMA inserted LMA Size: 4.0 Grade View: Grade I Number of attempts: 1 Placement Confirmation: positive ETCO2 and breath sounds checked- equal and bilateral Tube secured with: Tape Dental Injury: Teeth and Oropharynx as per pre-operative assessment

## 2017-07-05 NOTE — H&P (Signed)
Urology Admission H&P  Chief Complaint: left flank pain  History of Present Illness: Bethany King is a 47yo with a hx of nephrolithiasis who developed new left renal calculi. She has intermittent, sharp, moderate, nonradiating left flank pain  Past Medical History:  Diagnosis Date  . Allergic rhinitis   . Anxiety   . Arthritis   . Bladder pain   . Complication of anesthesia   . Costochondritis    hx  . DDD (degenerative disc disease), lumbar   . Depression   . GERD (gastroesophageal reflux disease)   . Hepatic hemangioma 2006   stable  . History of adenomatous polyp of colon   . History of colitis   . History of gastritis   . History of kidney stones   . HTN (hypertension)   . Irritable bowel syndrome (IBS)   . Medullary sponge kidney    left  . Migraine   . Nephrolithiasis    bilateral-- nonobstructive per CT  . PONV (postoperative nausea and vomiting)   . Spinal stenosis   . Urgency of urination    Past Surgical History:  Procedure Laterality Date  . ANTERIOR CERVICAL DECOMP/DISCECTOMY FUSION  02/26/2014   C5 - C6; fusion with plate  . BACK SURGERY    . CARPAL TUNNEL RELEASE Bilateral right 04-01-2010/  left 05-30-2010  . CHOLECYSTECTOMY N/A 07/14/2013   Procedure: LAPAROSCOPIC CHOLECYSTECTOMY;  Surgeon: Jamesetta So, MD;  Location: [facility];  Service: General;  Laterality: N/A;  . COLONOSCOPY  10-13-2010  . CYSTO WITH HYDRODISTENSION N/A 04/02/2015   Procedure: CYSTOSCOPY/HYDRODISTENSION, BLADDER BIOPSY WITH FULGURATION;  Surgeon: Irine Seal, MD;  Location: [facility];  Service: Urology;  Laterality: N/A;  . CYSTOSCOPY W/ URETERAL STENT PLACEMENT Bilateral 04/08/2016   Procedure: CYSTOSCOPY WITH BILATERAL RETROGRADE PYELOGRAM, BILATERAL URETERAL STENT PLACEMENT;  Surgeon: Cleon Gustin, MD;  Location: [facility];  Service: Urology;  Laterality: Bilateral;  . CYSTOSCOPY W/ URETERAL STENT PLACEMENT Left 04/15/2016   Procedure: CYSTOSCOPY WITH RETROGRADE  PYELOGRAM/URETERAL STENT PLACEMENT;  Surgeon: Cleon Gustin, MD;  Location: [facility];  Service: Urology;  Laterality: Left;  . CYSTOSCOPY WITH HOLMIUM LASER LITHOTRIPSY Right 04/08/2016   Procedure: CYSTOSCOPY WITH RIGHT RENAL STONE EXTRACTION WITH HOLMIUM LASER LITHOTRIPSY;  Surgeon: Cleon Gustin, MD;  Location: [facility];  Service: Urology;  Laterality: Right;  . CYSTOSCOPY WITH HOLMIUM LASER LITHOTRIPSY Left 04/15/2016   Procedure: CYSTOSCOPY WITH HOLMIUM LASER LITHOTRIPSY;  Surgeon: Cleon Gustin, MD;  Location: [facility];  Service: Urology;  Laterality: Left;  . DIAGNOSTIC LAPAROSCOPY    . DILATION AND CURETTAGE OF UTERUS    . DILITATION & CURRETTAGE/HYSTROSCOPY WITH THERMACHOICE ABLATION N/A 09/07/2012   Procedure: DILATATION & CURETTAGE/HYSTEROSCOPY WITH THERMACHOICE ABLATION;  Surgeon: Florian Buff, MD;  Location: [facility];  Service: Gynecology;  Laterality: N/A;  . ESOPHAGOGASTRODUODENOSCOPY  10/14/10  . EXTRACORPOREAL SHOCK WAVE LITHOTRIPSY  multiple  . PERCUTANEOUS NEPHROSTOLITHOTOMY  2003  . PLANTAR FASCIA SURGERY Bilateral right 2008//  left 2010  . STONE EXTRACTION WITH BASKET Left 04/15/2016   Procedure: STONE EXTRACTION WITH BASKET;  Surgeon: Cleon Gustin, MD;  Location: [facility];  Service: Urology;  Laterality: Left;    Home Medications:  Current Facility-Administered Medications  Medication Dose Route Frequency Provider Last Rate Last Dose  . ceFAZolin (ANCEF) IVPB 2g/100 mL premix  2 g Intravenous 30 min Pre-Op Monee Dembeck, Candee Furbish, MD      . lactated ringers infusion   Intravenous Continuous Lerry Liner, MD [phone]  mL/hr at 07/05/17 0728    . scopolamine (TRANSDERM-SCOP) 1 MG/3DAYS 1.5 mg  1 patch Transdermal Once Lerry Liner, MD   1.5 mg at 07/05/17 1610   Allergies:  Allergies  Allergen Reactions  . Hydromorphone Itching  . Prednisone Other (See Comments)    Increase anxiety symptoms, insomnia  . Tramadol Hcl Other (See Comments)    dizziness  . Sulfa  Antibiotics Rash    Family History  Problem Relation Age of Onset  . Irritable bowel syndrome Mother   . Scleroderma Mother   . Rheum arthritis Mother   . Asthma Mother   . Melanoma Father   . Cancer Father        Melanoma  . Colon cancer Cousin 64       second cousin Sunday Spillers [place])  . Cancer Maternal Grandmother        Ovarian  . Diabetes Maternal Grandmother   . Thyroid disease Maternal Aunt    Social History:  reports that  has never smoked. she has never used smokeless tobacco. She reports that she does not drink alcohol or use drugs.  Review of Systems  All other systems reviewed and are negative.   Physical Exam:  Vital signs in last 24 hours: Temp:  [98 F (36.7 C)] 98 F (36.7 C) (01/21 0648) Pulse Rate:  [80] 80 (01/21 0648) Resp:  [18] 18 (01/21 0648) BP: (113)/(76) 113/76 (01/21 0648) SpO2:  [98 %] 98 % (01/21 9604) Physical Exam  Constitutional: She is oriented to person, place, and time. She appears well-developed and well-nourished.  HENT:  Head: Normocephalic and atraumatic.  Eyes: EOM are normal. Pupils are equal, round, and reactive to light.  Neck: Normal range of motion. No thyromegaly present.  Cardiovascular: Normal rate and regular rhythm.  Respiratory: Effort normal. No respiratory distress.  GI: Soft. She exhibits no distension.  Musculoskeletal: Normal range of motion. She exhibits no edema.  Neurological: She is alert and oriented to person, place, and time.  Skin: Skin is warm and dry.  Psychiatric: She has a normal mood and affect. Her behavior is normal. Judgment and thought content normal.    Laboratory Data:  No results found for this or any previous visit (from the past 24 hour(s)). Recent Results (from the past 240 hour(s))  Culture, Urine     Status: Abnormal   Collection Time: 06/25/17  2:01 PM  Result Value Ref Range Status   Specimen Description URINE, CLEAN CATCH  Final   Special Requests NONE  Final   Culture MULTIPLE  SPECIES PRESENT, SUGGEST RECOLLECTION (A)  Final   Report Status 06/27/2017 FINAL  Final  Culture, Urine     Status: None   Collection Time: 07/02/17  6:10 PM  Result Value Ref Range Status   Specimen Description URINE, CLEAN CATCH  Final   Special Requests NONE  Final   Culture   Final    NO GROWTH Performed at [facility], [place] [address], [place], [place] [address]    Report Status 07/04/2017 FINAL  Final   Creatinine: Recent Labs    06/29/17 1104  CREATININE 0.73   Baseline Creatinine: 0.7  Impression/Assessment:  46yo with left renal calculi  Plan:  The risks/benefits/alternatives to Left ureteroscopic stone extraction was explained to the patient and she understands and wishes to proceed with surgery  Nicolette Bang 07/05/2017, 7:40 AM

## 2017-07-05 NOTE — Anesthesia Postprocedure Evaluation (Signed)
Anesthesia Post Note  Patient: Bethany King  Procedure(s) Performed: CYSTOSCOPY WITH RETROGRADE PYELOGRAM, URETEROSCOPY AND STENT PLACEMENT (Left Ureter) HOLMIUM LASER APPLICATION (Left Ureter)  Patient location during evaluation: PACU Anesthesia Type: General Level of consciousness: awake and patient cooperative Pain management: pain level controlled Vital Signs Assessment: post-procedure vital signs reviewed and stable Respiratory status: spontaneous breathing, nonlabored ventilation and respiratory function stable Cardiovascular status: blood pressure returned to baseline Postop Assessment: no apparent nausea or vomiting Anesthetic complications: no     Last Vitals:  Vitals:   07/05/17 0741 07/05/17 0900  BP:  116/80  Pulse:  91  Resp: (!) 21 (!) 4  Temp:  36.4 C  SpO2: 99% 100%    Last Pain:  Vitals:   07/05/17 0900  TempSrc:   PainSc: Asleep                 [name]

## 2017-07-05 NOTE — Anesthesia Preprocedure Evaluation (Signed)
Anesthesia Evaluation  Patient identified by MRN, date of birth, ID band Patient awake    Reviewed: Allergy & Precautions, H&P , NPO status , Patient's Chart, lab work & pertinent test results  History of Anesthesia Complications (+) PONV and history of anesthetic complications  Airway Mallampati: I  TM Distance: >3 FB     Dental  (+) Teeth Intact, Implants, Dental Advisory Given   Pulmonary neg pulmonary ROS,    breath sounds clear to auscultation       Cardiovascular hypertension, Pt. on medications  Rhythm:Regular Rate:Normal     Neuro/Psych  Headaches, PSYCHIATRIC DISORDERS Anxiety Depression  Neuromuscular disease    GI/Hepatic GERD  Medicated and Controlled,  Endo/Other    Renal/GU Renal disease     Musculoskeletal   Abdominal   Peds  Hematology   Anesthesia Other Findings   Reproductive/Obstetrics                             Anesthesia Physical Anesthesia Plan  ASA: II  Anesthesia Plan: General   Post-op Pain Management:    Induction: Intravenous  PONV Risk Score and Plan:   Airway Management Planned: LMA  Additional Equipment:   Intra-op Plan:   Post-operative Plan: Extubation in OR  Informed Consent: I have reviewed the patients History and Physical, chart, labs and discussed the procedure including the risks, benefits and alternatives for the proposed anesthesia with the patient or authorized representative who has indicated his/her understanding and acceptance.     Plan Discussed with:   Anesthesia Plan Comments:         Anesthesia Quick Evaluation

## 2017-07-06 ENCOUNTER — Encounter (HOSPITAL_COMMUNITY): Payer: Self-pay | Admitting: Urology

## 2017-07-06 LAB — POCT I-STAT 4, (NA,K, GLUC, HGB,HCT)
GLUCOSE: 84 mg/dL (ref 65–99)
HEMATOCRIT: 37 % (ref 36.0–46.0)
HEMOGLOBIN: 12.6 g/dL (ref 12.0–15.0)
POTASSIUM: 3.1 mmol/L — AB (ref 3.5–5.1)
SODIUM: 140 mmol/L (ref 135–145)

## 2017-07-07 ENCOUNTER — Ambulatory Visit (INDEPENDENT_AMBULATORY_CARE_PROVIDER_SITE_OTHER): Payer: Medicare Other | Admitting: Urology

## 2017-07-07 DIAGNOSIS — N200 Calculus of kidney: Secondary | ICD-10-CM | POA: Diagnosis not present

## 2017-07-07 LAB — STONE ANALYSIS
Ca Oxalate,Dihydrate: 15 %
Ca Oxalate,Monohydr.: 65 %
Ca phos cry stone ql IR: 20 %
STONE WEIGHT KSTONE: 80.2 mg

## 2017-07-13 ENCOUNTER — Other Ambulatory Visit: Payer: Self-pay | Admitting: Urology

## 2017-07-13 DIAGNOSIS — N200 Calculus of kidney: Secondary | ICD-10-CM

## 2017-07-19 DIAGNOSIS — Z6831 Body mass index (BMI) 31.0-31.9, adult: Secondary | ICD-10-CM | POA: Diagnosis not present

## 2017-07-19 DIAGNOSIS — E876 Hypokalemia: Secondary | ICD-10-CM | POA: Diagnosis not present

## 2017-07-19 DIAGNOSIS — I10 Essential (primary) hypertension: Secondary | ICD-10-CM | POA: Diagnosis not present

## 2017-07-19 DIAGNOSIS — T50905A Adverse effect of unspecified drugs, medicaments and biological substances, initial encounter: Secondary | ICD-10-CM | POA: Diagnosis not present

## 2017-07-19 DIAGNOSIS — R432 Parageusia: Secondary | ICD-10-CM | POA: Diagnosis not present

## 2017-07-19 DIAGNOSIS — E669 Obesity, unspecified: Secondary | ICD-10-CM | POA: Diagnosis not present

## 2017-07-19 DIAGNOSIS — E6609 Other obesity due to excess calories: Secondary | ICD-10-CM | POA: Diagnosis not present

## 2017-08-03 ENCOUNTER — Ambulatory Visit (HOSPITAL_COMMUNITY)
Admission: RE | Admit: 2017-08-03 | Discharge: 2017-08-03 | Disposition: A | Discharge status: Home / Self Care | Payer: Medicare Other | Source: Ambulatory Visit | Attending: Urology | Admitting: Urology

## 2017-08-03 DIAGNOSIS — N200 Calculus of kidney: Secondary | ICD-10-CM | POA: Insufficient documentation

## 2017-08-04 ENCOUNTER — Ambulatory Visit: Payer: Medicare Other | Admitting: Urology

## 2017-08-10 DIAGNOSIS — M533 Sacrococcygeal disorders, not elsewhere classified: Secondary | ICD-10-CM | POA: Diagnosis not present

## 2017-08-10 DIAGNOSIS — M25571 Pain in right ankle and joints of right foot: Secondary | ICD-10-CM | POA: Diagnosis not present

## 2017-08-10 DIAGNOSIS — M25473 Effusion, unspecified ankle: Secondary | ICD-10-CM | POA: Diagnosis not present

## 2017-08-10 DIAGNOSIS — M549 Dorsalgia, unspecified: Secondary | ICD-10-CM | POA: Diagnosis not present

## 2017-08-10 DIAGNOSIS — M1990 Unspecified osteoarthritis, unspecified site: Secondary | ICD-10-CM | POA: Diagnosis not present

## 2017-08-10 DIAGNOSIS — M79672 Pain in left foot: Secondary | ICD-10-CM | POA: Diagnosis not present

## 2017-08-10 DIAGNOSIS — M25572 Pain in left ankle and joints of left foot: Secondary | ICD-10-CM | POA: Diagnosis not present

## 2017-08-10 DIAGNOSIS — M2550 Pain in unspecified joint: Secondary | ICD-10-CM | POA: Diagnosis not present

## 2017-08-10 DIAGNOSIS — M79642 Pain in left hand: Secondary | ICD-10-CM | POA: Diagnosis not present

## 2017-08-10 DIAGNOSIS — M1812 Unilateral primary osteoarthritis of first carpometacarpal joint, left hand: Secondary | ICD-10-CM | POA: Diagnosis not present

## 2017-08-10 DIAGNOSIS — M25579 Pain in unspecified ankle and joints of unspecified foot: Secondary | ICD-10-CM | POA: Diagnosis not present

## 2017-08-10 DIAGNOSIS — M79671 Pain in right foot: Secondary | ICD-10-CM | POA: Diagnosis not present

## 2017-08-10 DIAGNOSIS — M79643 Pain in unspecified hand: Secondary | ICD-10-CM | POA: Diagnosis not present

## 2017-08-10 DIAGNOSIS — M79641 Pain in right hand: Secondary | ICD-10-CM | POA: Diagnosis not present

## 2017-08-11 DIAGNOSIS — Z1389 Encounter for screening for other disorder: Secondary | ICD-10-CM | POA: Diagnosis not present

## 2017-08-11 DIAGNOSIS — E6609 Other obesity due to excess calories: Secondary | ICD-10-CM | POA: Diagnosis not present

## 2017-08-11 DIAGNOSIS — Z6831 Body mass index (BMI) 31.0-31.9, adult: Secondary | ICD-10-CM | POA: Diagnosis not present

## 2017-08-11 DIAGNOSIS — J069 Acute upper respiratory infection, unspecified: Secondary | ICD-10-CM | POA: Diagnosis not present

## 2017-08-26 ENCOUNTER — Ambulatory Visit (INDEPENDENT_AMBULATORY_CARE_PROVIDER_SITE_OTHER): Payer: Medicare Other | Admitting: Otolaryngology

## 2017-08-26 DIAGNOSIS — R438 Other disturbances of smell and taste: Secondary | ICD-10-CM

## 2017-08-27 DIAGNOSIS — Z79899 Other long term (current) drug therapy: Secondary | ICD-10-CM | POA: Diagnosis not present

## 2017-08-27 DIAGNOSIS — M79643 Pain in unspecified hand: Secondary | ICD-10-CM | POA: Diagnosis not present

## 2017-08-27 DIAGNOSIS — M25579 Pain in unspecified ankle and joints of unspecified foot: Secondary | ICD-10-CM | POA: Diagnosis not present

## 2017-08-27 DIAGNOSIS — M25473 Effusion, unspecified ankle: Secondary | ICD-10-CM | POA: Diagnosis not present

## 2017-08-27 DIAGNOSIS — R21 Rash and other nonspecific skin eruption: Secondary | ICD-10-CM | POA: Diagnosis not present

## 2017-08-27 DIAGNOSIS — M2550 Pain in unspecified joint: Secondary | ICD-10-CM | POA: Diagnosis not present

## 2017-08-27 DIAGNOSIS — M1990 Unspecified osteoarthritis, unspecified site: Secondary | ICD-10-CM | POA: Diagnosis not present

## 2017-09-01 ENCOUNTER — Ambulatory Visit (INDEPENDENT_AMBULATORY_CARE_PROVIDER_SITE_OTHER): Payer: Medicare Other | Admitting: Urology

## 2017-09-01 ENCOUNTER — Ambulatory Visit (HOSPITAL_COMMUNITY)
Admission: RE | Admit: 2017-09-01 | Discharge: 2017-09-01 | Disposition: A | Discharge status: Home / Self Care | Payer: Medicare Other | Source: Ambulatory Visit | Attending: Urology | Admitting: Urology

## 2017-09-01 ENCOUNTER — Other Ambulatory Visit: Payer: Self-pay | Admitting: Urology

## 2017-09-01 DIAGNOSIS — R1084 Generalized abdominal pain: Secondary | ICD-10-CM | POA: Diagnosis not present

## 2017-09-01 DIAGNOSIS — N200 Calculus of kidney: Secondary | ICD-10-CM | POA: Insufficient documentation

## 2017-09-02 DIAGNOSIS — L218 Other seborrheic dermatitis: Secondary | ICD-10-CM | POA: Diagnosis not present

## 2017-09-02 DIAGNOSIS — D225 Melanocytic nevi of trunk: Secondary | ICD-10-CM | POA: Diagnosis not present

## 2017-09-02 DIAGNOSIS — Z1283 Encounter for screening for malignant neoplasm of skin: Secondary | ICD-10-CM | POA: Diagnosis not present

## 2017-09-07 DIAGNOSIS — G894 Chronic pain syndrome: Secondary | ICD-10-CM | POA: Diagnosis not present

## 2017-09-07 DIAGNOSIS — Z6830 Body mass index (BMI) 30.0-30.9, adult: Secondary | ICD-10-CM | POA: Diagnosis not present

## 2017-09-07 DIAGNOSIS — M940 Chondrocostal junction syndrome [Tietze]: Secondary | ICD-10-CM | POA: Diagnosis not present

## 2017-09-07 DIAGNOSIS — N390 Urinary tract infection, site not specified: Secondary | ICD-10-CM | POA: Diagnosis not present

## 2017-09-07 DIAGNOSIS — R300 Dysuria: Secondary | ICD-10-CM | POA: Diagnosis not present

## 2017-09-07 DIAGNOSIS — M549 Dorsalgia, unspecified: Secondary | ICD-10-CM | POA: Diagnosis not present

## 2017-09-07 DIAGNOSIS — I10 Essential (primary) hypertension: Secondary | ICD-10-CM | POA: Diagnosis not present

## 2017-09-07 DIAGNOSIS — E6609 Other obesity due to excess calories: Secondary | ICD-10-CM | POA: Diagnosis not present

## 2017-09-19 IMAGING — RF DG RETROGRADE PYELOGRAM
1 series · 3 of 3 positions shown · non-contrast
Comparison: None.

CLINICAL DATA: Left ureteral calculi

EXAM:
INTRAOPERATIVE LEFT RETROGRADE UROGRAPHY
TECHNIQUE: Images were obtained with the C-arm fluoroscopic device
intraoperatively and submitted for interpretation post-operatively.
Please see the procedural report for the amount of contrast and the
fluoroscopy time utilized.

[Series 1: run · 3 of 3 slices shown]
[im 1/3]
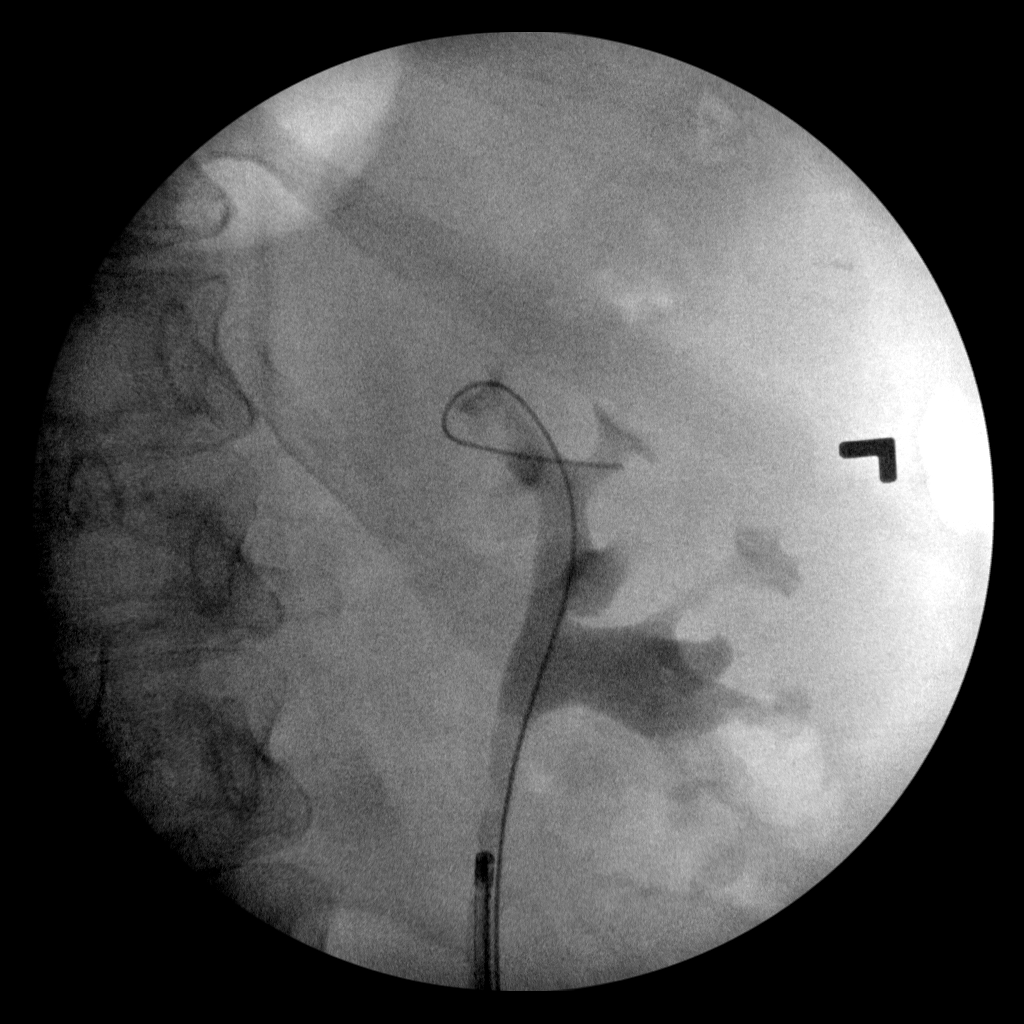
[im 2/3]
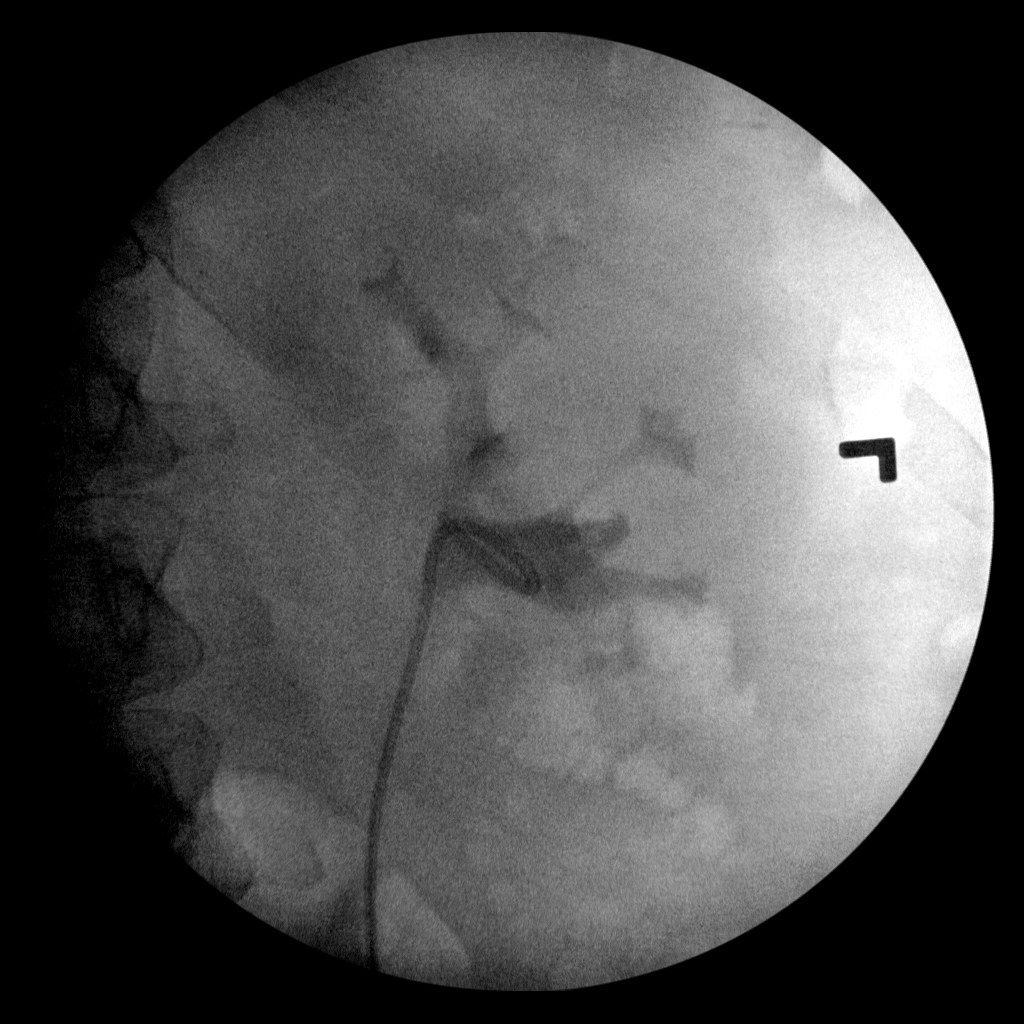
[im 3/3]
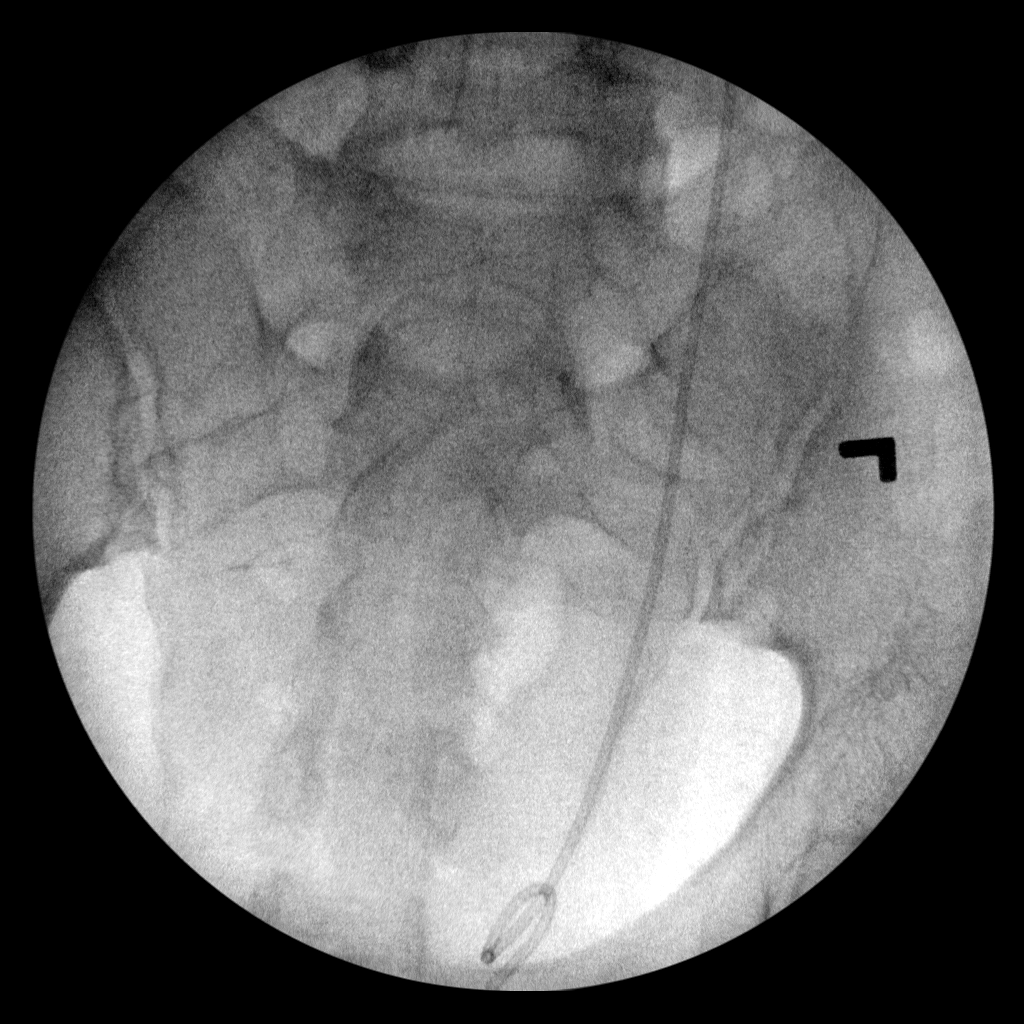

[3 of 3 positions shown; findings below may reference images not displayed]

FINDINGS: Contrast fills the left renal collecting system. A left ureteral
stent has been placed from the left renal pelvis to the bladder.
IMPRESSION: See above.

## 2017-09-21 DIAGNOSIS — N200 Calculus of kidney: Secondary | ICD-10-CM | POA: Diagnosis not present

## 2017-09-29 DIAGNOSIS — L409 Psoriasis, unspecified: Secondary | ICD-10-CM | POA: Diagnosis not present

## 2017-09-29 DIAGNOSIS — M25473 Effusion, unspecified ankle: Secondary | ICD-10-CM | POA: Diagnosis not present

## 2017-09-29 DIAGNOSIS — Z79899 Other long term (current) drug therapy: Secondary | ICD-10-CM | POA: Diagnosis not present

## 2017-09-29 DIAGNOSIS — M25579 Pain in unspecified ankle and joints of unspecified foot: Secondary | ICD-10-CM | POA: Diagnosis not present

## 2017-09-29 DIAGNOSIS — M79643 Pain in unspecified hand: Secondary | ICD-10-CM | POA: Diagnosis not present

## 2017-09-29 DIAGNOSIS — M2550 Pain in unspecified joint: Secondary | ICD-10-CM | POA: Diagnosis not present

## 2017-09-29 DIAGNOSIS — L4050 Arthropathic psoriasis, unspecified: Secondary | ICD-10-CM | POA: Diagnosis not present

## 2017-09-29 DIAGNOSIS — M1990 Unspecified osteoarthritis, unspecified site: Secondary | ICD-10-CM | POA: Diagnosis not present

## 2017-10-15 DIAGNOSIS — Z1389 Encounter for screening for other disorder: Secondary | ICD-10-CM | POA: Diagnosis not present

## 2017-10-15 DIAGNOSIS — N390 Urinary tract infection, site not specified: Secondary | ICD-10-CM | POA: Diagnosis not present

## 2017-10-15 DIAGNOSIS — E6609 Other obesity due to excess calories: Secondary | ICD-10-CM | POA: Diagnosis not present

## 2017-10-15 DIAGNOSIS — Z6830 Body mass index (BMI) 30.0-30.9, adult: Secondary | ICD-10-CM | POA: Diagnosis not present

## 2017-10-15 DIAGNOSIS — N342 Other urethritis: Secondary | ICD-10-CM | POA: Diagnosis not present

## 2017-10-29 DIAGNOSIS — E876 Hypokalemia: Secondary | ICD-10-CM | POA: Diagnosis not present

## 2017-11-10 DIAGNOSIS — L409 Psoriasis, unspecified: Secondary | ICD-10-CM | POA: Diagnosis not present

## 2017-11-10 DIAGNOSIS — M25473 Effusion, unspecified ankle: Secondary | ICD-10-CM | POA: Diagnosis not present

## 2017-11-10 DIAGNOSIS — M25579 Pain in unspecified ankle and joints of unspecified foot: Secondary | ICD-10-CM | POA: Diagnosis not present

## 2017-11-10 DIAGNOSIS — M1990 Unspecified osteoarthritis, unspecified site: Secondary | ICD-10-CM | POA: Diagnosis not present

## 2017-11-10 DIAGNOSIS — M79643 Pain in unspecified hand: Secondary | ICD-10-CM | POA: Diagnosis not present

## 2017-11-10 DIAGNOSIS — Z79899 Other long term (current) drug therapy: Secondary | ICD-10-CM | POA: Diagnosis not present

## 2017-11-10 DIAGNOSIS — L4050 Arthropathic psoriasis, unspecified: Secondary | ICD-10-CM | POA: Diagnosis not present

## 2017-11-12 DIAGNOSIS — N342 Other urethritis: Secondary | ICD-10-CM | POA: Diagnosis not present

## 2017-11-12 DIAGNOSIS — E6609 Other obesity due to excess calories: Secondary | ICD-10-CM | POA: Diagnosis not present

## 2017-11-12 DIAGNOSIS — Z6831 Body mass index (BMI) 31.0-31.9, adult: Secondary | ICD-10-CM | POA: Diagnosis not present

## 2017-11-12 DIAGNOSIS — N390 Urinary tract infection, site not specified: Secondary | ICD-10-CM | POA: Diagnosis not present

## 2017-11-16 DIAGNOSIS — R300 Dysuria: Secondary | ICD-10-CM | POA: Diagnosis not present

## 2017-11-16 DIAGNOSIS — N200 Calculus of kidney: Secondary | ICD-10-CM | POA: Diagnosis not present

## 2017-11-16 DIAGNOSIS — R3915 Urgency of urination: Secondary | ICD-10-CM | POA: Diagnosis not present

## 2017-11-16 DIAGNOSIS — R350 Frequency of micturition: Secondary | ICD-10-CM | POA: Diagnosis not present

## 2017-11-16 DIAGNOSIS — N3946 Mixed incontinence: Secondary | ICD-10-CM | POA: Diagnosis not present

## 2017-11-24 DIAGNOSIS — M47894 Other spondylosis, thoracic region: Secondary | ICD-10-CM | POA: Diagnosis not present

## 2017-11-24 DIAGNOSIS — M47816 Spondylosis without myelopathy or radiculopathy, lumbar region: Secondary | ICD-10-CM | POA: Diagnosis not present

## 2017-12-01 ENCOUNTER — Ambulatory Visit (INDEPENDENT_AMBULATORY_CARE_PROVIDER_SITE_OTHER): Payer: Medicare Other | Admitting: Urology

## 2017-12-01 DIAGNOSIS — N200 Calculus of kidney: Secondary | ICD-10-CM | POA: Diagnosis not present

## 2017-12-01 DIAGNOSIS — N3281 Overactive bladder: Secondary | ICD-10-CM

## 2017-12-13 DIAGNOSIS — E6609 Other obesity due to excess calories: Secondary | ICD-10-CM | POA: Diagnosis not present

## 2017-12-13 DIAGNOSIS — J069 Acute upper respiratory infection, unspecified: Secondary | ICD-10-CM | POA: Diagnosis not present

## 2017-12-13 DIAGNOSIS — Z6830 Body mass index (BMI) 30.0-30.9, adult: Secondary | ICD-10-CM | POA: Diagnosis not present

## 2017-12-13 DIAGNOSIS — J029 Acute pharyngitis, unspecified: Secondary | ICD-10-CM | POA: Diagnosis not present

## 2017-12-21 DIAGNOSIS — Z6830 Body mass index (BMI) 30.0-30.9, adult: Secondary | ICD-10-CM | POA: Diagnosis not present

## 2017-12-21 DIAGNOSIS — Z1389 Encounter for screening for other disorder: Secondary | ICD-10-CM | POA: Diagnosis not present

## 2017-12-21 DIAGNOSIS — J329 Chronic sinusitis, unspecified: Secondary | ICD-10-CM | POA: Diagnosis not present

## 2017-12-21 DIAGNOSIS — I10 Essential (primary) hypertension: Secondary | ICD-10-CM | POA: Diagnosis not present

## 2017-12-21 DIAGNOSIS — B009 Herpesviral infection, unspecified: Secondary | ICD-10-CM | POA: Diagnosis not present

## 2017-12-21 DIAGNOSIS — G894 Chronic pain syndrome: Secondary | ICD-10-CM | POA: Diagnosis not present

## 2017-12-24 DIAGNOSIS — M25579 Pain in unspecified ankle and joints of unspecified foot: Secondary | ICD-10-CM | POA: Diagnosis not present

## 2017-12-24 DIAGNOSIS — J40 Bronchitis, not specified as acute or chronic: Secondary | ICD-10-CM | POA: Diagnosis not present

## 2017-12-24 DIAGNOSIS — M1990 Unspecified osteoarthritis, unspecified site: Secondary | ICD-10-CM | POA: Diagnosis not present

## 2017-12-24 DIAGNOSIS — Z79899 Other long term (current) drug therapy: Secondary | ICD-10-CM | POA: Diagnosis not present

## 2017-12-24 DIAGNOSIS — M79643 Pain in unspecified hand: Secondary | ICD-10-CM | POA: Diagnosis not present

## 2017-12-24 DIAGNOSIS — L409 Psoriasis, unspecified: Secondary | ICD-10-CM | POA: Diagnosis not present

## 2017-12-24 DIAGNOSIS — L4050 Arthropathic psoriasis, unspecified: Secondary | ICD-10-CM | POA: Diagnosis not present

## 2017-12-24 DIAGNOSIS — R05 Cough: Secondary | ICD-10-CM | POA: Diagnosis not present

## 2017-12-24 DIAGNOSIS — M25473 Effusion, unspecified ankle: Secondary | ICD-10-CM | POA: Diagnosis not present

## 2018-01-05 DIAGNOSIS — G894 Chronic pain syndrome: Secondary | ICD-10-CM | POA: Diagnosis not present

## 2018-01-05 DIAGNOSIS — N390 Urinary tract infection, site not specified: Secondary | ICD-10-CM | POA: Diagnosis not present

## 2018-01-05 DIAGNOSIS — E6609 Other obesity due to excess calories: Secondary | ICD-10-CM | POA: Diagnosis not present

## 2018-01-05 DIAGNOSIS — E876 Hypokalemia: Secondary | ICD-10-CM | POA: Diagnosis not present

## 2018-01-05 DIAGNOSIS — Z0001 Encounter for general adult medical examination with abnormal findings: Secondary | ICD-10-CM | POA: Diagnosis not present

## 2018-01-05 DIAGNOSIS — Z6830 Body mass index (BMI) 30.0-30.9, adult: Secondary | ICD-10-CM | POA: Diagnosis not present

## 2018-01-05 DIAGNOSIS — F419 Anxiety disorder, unspecified: Secondary | ICD-10-CM | POA: Diagnosis not present

## 2018-01-05 DIAGNOSIS — N3010 Interstitial cystitis (chronic) without hematuria: Secondary | ICD-10-CM | POA: Diagnosis not present

## 2018-01-07 DIAGNOSIS — E6609 Other obesity due to excess calories: Secondary | ICD-10-CM | POA: Diagnosis not present

## 2018-01-07 DIAGNOSIS — E039 Hypothyroidism, unspecified: Secondary | ICD-10-CM | POA: Diagnosis not present

## 2018-01-07 DIAGNOSIS — Z6830 Body mass index (BMI) 30.0-30.9, adult: Secondary | ICD-10-CM | POA: Diagnosis not present

## 2018-01-07 DIAGNOSIS — Z0001 Encounter for general adult medical examination with abnormal findings: Secondary | ICD-10-CM | POA: Diagnosis not present

## 2018-01-07 DIAGNOSIS — Z1389 Encounter for screening for other disorder: Secondary | ICD-10-CM | POA: Diagnosis not present

## 2018-01-20 DIAGNOSIS — E6609 Other obesity due to excess calories: Secondary | ICD-10-CM | POA: Diagnosis not present

## 2018-01-20 DIAGNOSIS — N342 Other urethritis: Secondary | ICD-10-CM | POA: Diagnosis not present

## 2018-01-20 DIAGNOSIS — M549 Dorsalgia, unspecified: Secondary | ICD-10-CM | POA: Diagnosis not present

## 2018-01-20 DIAGNOSIS — Z6830 Body mass index (BMI) 30.0-30.9, adult: Secondary | ICD-10-CM | POA: Diagnosis not present

## 2018-01-20 DIAGNOSIS — R350 Frequency of micturition: Secondary | ICD-10-CM | POA: Diagnosis not present

## 2018-01-28 ENCOUNTER — Ambulatory Visit (INDEPENDENT_AMBULATORY_CARE_PROVIDER_SITE_OTHER): Payer: Medicare Other | Admitting: Urology

## 2018-01-28 ENCOUNTER — Other Ambulatory Visit (HOSPITAL_COMMUNITY)
Admission: AD | Admit: 2018-01-28 | Discharge: 2018-01-28 | Disposition: A | Discharge status: Home / Self Care | Payer: Medicare Other | Source: Other Acute Inpatient Hospital | Attending: Urology | Admitting: Urology

## 2018-01-28 ENCOUNTER — Ambulatory Visit (HOSPITAL_COMMUNITY)
Admission: RE | Admit: 2018-01-28 | Discharge: 2018-01-28 | Disposition: A | Discharge status: Home / Self Care | Payer: Medicare Other | Source: Ambulatory Visit | Attending: Urology | Admitting: Urology

## 2018-01-28 ENCOUNTER — Other Ambulatory Visit: Payer: Self-pay | Admitting: Urology

## 2018-01-28 DIAGNOSIS — N200 Calculus of kidney: Secondary | ICD-10-CM | POA: Diagnosis not present

## 2018-01-28 DIAGNOSIS — N3281 Overactive bladder: Secondary | ICD-10-CM | POA: Insufficient documentation

## 2018-01-28 DIAGNOSIS — N3946 Mixed incontinence: Secondary | ICD-10-CM

## 2018-01-28 DIAGNOSIS — R1032 Left lower quadrant pain: Secondary | ICD-10-CM

## 2018-01-28 LAB — URINALYSIS, COMPLETE (UACMP) WITH MICROSCOPIC
Bilirubin Urine: NEGATIVE
GLUCOSE, UA: NEGATIVE mg/dL
KETONES UR: NEGATIVE mg/dL
NITRITE: NEGATIVE
PROTEIN: NEGATIVE mg/dL
Specific Gravity, Urine: 1.003 — ABNORMAL LOW (ref 1.005–1.030)
pH: 7 (ref 5.0–8.0)

## 2018-01-29 LAB — URINE CULTURE: CULTURE: NO GROWTH

## 2018-02-04 DIAGNOSIS — R05 Cough: Secondary | ICD-10-CM | POA: Diagnosis not present

## 2018-02-04 DIAGNOSIS — M25579 Pain in unspecified ankle and joints of unspecified foot: Secondary | ICD-10-CM | POA: Diagnosis not present

## 2018-02-04 DIAGNOSIS — L409 Psoriasis, unspecified: Secondary | ICD-10-CM | POA: Diagnosis not present

## 2018-02-04 DIAGNOSIS — M79643 Pain in unspecified hand: Secondary | ICD-10-CM | POA: Diagnosis not present

## 2018-02-04 DIAGNOSIS — Z79899 Other long term (current) drug therapy: Secondary | ICD-10-CM | POA: Diagnosis not present

## 2018-02-04 DIAGNOSIS — M1990 Unspecified osteoarthritis, unspecified site: Secondary | ICD-10-CM | POA: Diagnosis not present

## 2018-02-04 DIAGNOSIS — L4050 Arthropathic psoriasis, unspecified: Secondary | ICD-10-CM | POA: Diagnosis not present

## 2018-02-17 ENCOUNTER — Ambulatory Visit (INDEPENDENT_AMBULATORY_CARE_PROVIDER_SITE_OTHER): Payer: Medicare Other | Admitting: Obstetrics & Gynecology

## 2018-02-17 ENCOUNTER — Other Ambulatory Visit: Payer: Self-pay

## 2018-02-17 ENCOUNTER — Encounter: Payer: Self-pay | Admitting: Obstetrics & Gynecology

## 2018-02-17 VITALS — BP 122/78 | HR 73 | Ht 63.0 in | Wt 172.0 lb

## 2018-02-17 DIAGNOSIS — R1032 Left lower quadrant pain: Secondary | ICD-10-CM

## 2018-02-17 DIAGNOSIS — N951 Menopausal and female climacteric states: Secondary | ICD-10-CM

## 2018-02-17 MED ORDER — CONJ ESTROGENS-BAZEDOXIFENE 0.45-20 MG PO TABS: 1.0000 | ORAL_TABLET | Freq: Every day | ORAL | 11 refills | Status: DC

## 2018-02-17 NOTE — Progress Notes (Signed)
Chief Complaint  Patient presents with  . Pelvic Pain    left side pain      47 y.o. G0P0 No LMP recorded. Patient has had an ablation. The current method of family planning is condoms .  Outpatient Encounter Medications as of 02/17/2018  Medication Sig  . ALPRAZolam (XANAX) 1 MG tablet Take 1 mg by mouth 3 (three) times daily as needed for anxiety.   . cetirizine (ZYRTEC) 10 MG tablet Take 10 mg by mouth daily.  . DULoxetine (CYMBALTA) 60 MG capsule Take 60 mg by mouth daily.  Marland Kitchen HYDROcodone-acetaminophen (NORCO) 10-325 MG tablet Take 1 tablet by mouth every 4 (four) hours as needed for moderate pain.  . Multiple Vitamin (MULTIVITAMIN WITH MINERALS) TABS tablet Take 1 tablet by mouth daily.  . phenazopyridine (PYRIDIUM) 200 MG tablet Take 1 tablet (200 mg total) by mouth 3 (three) times daily as needed for pain.  . potassium chloride SA (K-DUR,KLOR-CON) 20 MEQ tablet Take 40 mEq by mouth daily.   . [DISCONTINUED] cyclobenzaprine (FLEXERIL) 10 MG tablet Take 10 mg by mouth 3 (three) times daily as needed for muscle spasms.  . [DISCONTINUED] ondansetron (ZOFRAN) 4 MG tablet Take 4 mg by mouth every 8 (eight) hours as needed for nausea or vomiting.  Marland Kitchen Conj Estrogens-Bazedoxifene 0.45-20 MG TABS Take 1 tablet by mouth daily. Take 1 tablet daily  . folic acid (FOLVITE) 1 MG tablet TK 2 TS PO QD  . methotrexate (RHEUMATREX) 2.5 MG tablet TK 6 TS PO 1 TIME WEEKLY  . oxybutynin (DITROPAN) 5 MG tablet TK 1 T PO TID  . triamterene-hydrochlorothiazide (DYAZIDE) 37.5-25 MG capsule TK 1 C PO ONCE D  . [DISCONTINUED] fluticasone (FLONASE) 50 MCG/ACT nasal spray Place 1 spray into both nostrils daily as needed for allergies or rhinitis.  . [DISCONTINUED] hydrochlorothiazide (HYDRODIURIL) 25 MG tablet Take 25 mg by mouth daily.   . [DISCONTINUED] Probiotic Product (ALIGN) 4 MG CAPS Take 4 mg by mouth daily.   No facility-administered encounter medications on file as of 02/17/2018.      Subjective Pt has had episodic LLQ pain/midline pain since about 01/31/2018 Pain is crampy mild to moderate worse on 821-23 associated with mucousy brown spotting Has IC which she also thinks may be flaring Continued frequency issues  Also having hot flashes and moodiness Past Medical History:  Diagnosis Date  . Allergic rhinitis   . Anxiety   . Arthritis   . Bladder pain   . Complication of anesthesia   . Costochondritis    hx  . DDD (degenerative disc disease), lumbar   . Depression   . GERD (gastroesophageal reflux disease)   . Hepatic hemangioma 2006   stable  . History of adenomatous polyp of colon   . History of colitis   . History of gastritis   . History of kidney stones   . HTN (hypertension)   . Irritable bowel syndrome (IBS)   . Medullary sponge kidney    left  . Migraine   . Nephrolithiasis    bilateral-- nonobstructive per CT  . PONV (postoperative nausea and vomiting)   . Psoriatic arthritis ([place])   . Spinal stenosis   . Urgency of urination     Past Surgical History:  Procedure Laterality Date  . ANTERIOR CERVICAL DECOMP/DISCECTOMY FUSION  02/26/2014   C5 - C6; fusion with plate  . BACK SURGERY    . CARPAL TUNNEL RELEASE Bilateral right 04-01-2010/  left 05-30-2010  .  CHOLECYSTECTOMY N/A 07/14/2013   Procedure: LAPAROSCOPIC CHOLECYSTECTOMY;  Surgeon: Jamesetta So, MD;  Location: [facility];  Service: General;  Laterality: N/A;  . COLONOSCOPY  10-13-2010  . CYSTO WITH HYDRODISTENSION N/A 04/02/2015   Procedure: CYSTOSCOPY/HYDRODISTENSION, BLADDER BIOPSY WITH FULGURATION;  Surgeon: Irine Seal, MD;  Location[facility];  Service: Urology;  Laterality: N/A;  . CYSTOSCOPY W/ URETERAL STENT PLACEMENT Bilateral 04/08/2016   Procedure: CYSTOSCOPY WITH BILATERAL RETROGRADE PYELOGRAM, BILATERAL URETERAL STENT PLACEMENT;  Surgeon: Cleon Gustin, MD;  Location: [facility];  Service: Urology;  Laterality: Bilateral;  . CYSTOSCOPY W/ URETERAL  STENT PLACEMENT Left 04/15/2016   Procedure: CYSTOSCOPY WITH RETROGRADE PYELOGRAM/URETERAL STENT PLACEMENT;  Surgeon: Cleon Gustin, MD;  Location: [facility];  Service: Urology;  Laterality: Left;  . CYSTOSCOPY WITH HOLMIUM LASER LITHOTRIPSY Right 04/08/2016   Procedure: CYSTOSCOPY WITH RIGHT RENAL STONE EXTRACTION WITH HOLMIUM LASER LITHOTRIPSY;  Surgeon: Cleon Gustin, MD;  Location: [facility];  Service: Urology;  Laterality: Right;  . CYSTOSCOPY WITH HOLMIUM LASER LITHOTRIPSY Left 04/15/2016   Procedure: CYSTOSCOPY WITH HOLMIUM LASER LITHOTRIPSY;  Surgeon: Cleon Gustin, MD;  Location: [facility];  Service: Urology;  Laterality: Left;  . CYSTOSCOPY WITH RETROGRADE PYELOGRAM, URETEROSCOPY AND STENT PLACEMENT Left 07/05/2017   Procedure: CYSTOSCOPY WITH RETROGRADE PYELOGRAM, URETEROSCOPY AND STENT PLACEMENT;  Surgeon: Cleon Gustin, MD;  Location: [facility];  Service: Urology;  Laterality: Left;  . DIAGNOSTIC LAPAROSCOPY    . DILATION AND CURETTAGE OF UTERUS    . DILITATION & CURRETTAGE/HYSTROSCOPY WITH THERMACHOICE ABLATION N/A 09/07/2012   Procedure: DILATATION & CURETTAGE/HYSTEROSCOPY WITH THERMACHOICE ABLATION;  Surgeon: Florian Buff, MD;  Location: [facility];  Service: Gynecology;  Laterality: N/A;  . ESOPHAGOGASTRODUODENOSCOPY  10/14/10  . EXTRACORPOREAL SHOCK WAVE LITHOTRIPSY  multiple  . HOLMIUM LASER APPLICATION Left 08/26/9700   Procedure: HOLMIUM LASER APPLICATION;  Surgeon: Cleon Gustin, MD;  Location: [facility];  Service: Urology;  Laterality: Left;  . PERCUTANEOUS NEPHROSTOLITHOTOMY  2003  . PLANTAR FASCIA SURGERY Bilateral right 2008//  left 2010  . STONE EXTRACTION WITH BASKET Left 04/15/2016   Procedure: STONE EXTRACTION WITH BASKET;  Surgeon: Cleon Gustin, MD;  Location: [facility];  Service: Urology;  Laterality: Left;    OB History    Gravida  0   Para      Term      Preterm      AB      Living  0     SAB      TAB      Ectopic      Multiple       Live Births              Allergies  Allergen Reactions  . Hydromorphone Itching  . Prednisone Other (See Comments)    Increase anxiety symptoms, insomnia  . Tramadol Hcl Other (See Comments)    dizziness  . Sulfa Antibiotics Rash    Social History   Socioeconomic History  . Marital status: Divorced    Spouse name: Not on file  . Number of children: 0  . Years of education: Not on file  . Highest education level: Not on file  Occupational History  . Occupation: ORDER Secretary/administrator: [employer]  Social Needs  . Financial resource strain: Not on file  . Food insecurity:    Worry: Not on file    Inability: Not on file  . Transportation needs:    Medical:  Not on file    Non-medical: Not on file  Tobacco Use  . Smoking status: Never Smoker  . Smokeless tobacco: Never Used  Substance and Sexual Activity  . Alcohol use: No  . Drug use: No  . Sexual activity: Yes    Birth control/protection: Surgical    Comment: ablation  Lifestyle  . Physical activity:    Days per week: Not on file    Minutes per session: Not on file  . Stress: Not on file  Relationships  . Social connections:    Talks on phone: Not on file    Gets together: Not on file    Attends religious service: Not on file    Active member of club or organization: Not on file    Attends meetings of clubs or organizations: Not on file    Relationship status: Not on file  Other Topics Concern  . Not on file  Social History Narrative  . Not on file    Family History  Problem Relation Age of Onset  . Irritable bowel syndrome Mother   . Scleroderma Mother   . Rheum arthritis Mother   . Asthma Mother   . Melanoma Father   . Cancer Father        Melanoma  . Colon cancer Cousin 73       second cousin Sunday Spill[place])  . Cancer Maternal Grandmother        Ovarian  . Diabetes Maternal Grandmother   . Thyroid disease Maternal Aunt     Medications:       Current Outpatient Medications:   .  ALPRAZolam (XANAX) 1 MG tablet, Take 1 mg by mouth 3 (three) times daily as needed for anxiety. , Disp: , Rfl:  .  cetirizine (ZYRTEC) 10 MG tablet, Take 10 mg by mouth daily., Disp: , Rfl:  .  DULoxetine (CYMBALTA) 60 MG capsule, Take 60 mg by mouth daily., Disp: , Rfl:  .  HYDROcodone-acetaminophen (NORCO) 10-325 MG tablet, Take 1 tablet by mouth every 4 (four) hours as needed for moderate pain., Disp: 30 tablet, Rfl: 0 .  Multiple Vitamin (MULTIVITAMIN WITH MINERALS) TABS tablet, Take 1 tablet by mouth daily., Disp: , Rfl:  .  phenazopyridine (PYRIDIUM) 200 MG tablet, Take 1 tablet (200 mg total) by mouth 3 (three) times daily as needed for pain., Disp: 30 tablet, Rfl: 0 .  potassium chloride SA (K-DUR,KLOR-CON) 20 MEQ tablet, Take 40 mEq by mouth daily. , Disp: , Rfl:  .  Conj Estrogens-Bazedoxifene 0.45-20 MG TABS, Take 1 tablet by mouth daily. Take 1 tablet daily, Disp: 30 tablet, Rfl: 11 .  folic acid (FOLVITE) 1 MG tablet, TK 2 TS PO QD, Disp: , Rfl: 3 .  methotrexate (RHEUMATREX) 2.5 MG tablet, TK 6 TS PO 1 TIME WEEKLY, Disp: , Rfl: 3 .  oxybutynin (DITROPAN) 5 MG tablet, TK 1 T PO TID, Disp: , Rfl: 6 .  triamterene-hydrochlorothiazide (DYAZIDE) 37.5-25 MG capsule, TK 1 C PO ONCE D, Disp: , Rfl: 11  Objective Blood pressure 122/78, pulse 73, height 5\' 3"  (1.6 m), weight 172 lb (78 kg).  General [facility] female NAD Vulva:  normal appearing vulva with no masses, tenderness or lesions Vagina:  normal mucosa, no discharge Cervix:  no cervical motion tenderness and no lesions Uterus:  normal size, contour, position, consistency, mobility, non-tender Adnexa: ovaries:present,  normal adnexa in size, nontender and no masses   Pertinent ROS No burning with urination, frequency or urgency No nausea, vomiting  or diarrhea Nor fever chills or other constitutional symptoms   Labs or studies     Impression Diagnoses this Encounter::   ICD-10-CM   1. LLQ pain R10.32   2. Menopausal  syndrome (hot flashes) N95.1     Established relevant diagnosis(es):  Plan/Recommendations: Meds ordered this encounter  Medications  . Conj Estrogens-Bazedoxifene 0.45-20 MG TABS    Sig: Take 1 tablet by mouth daily. Take 1 tablet daily    Dispense:  30 tablet    Refill:  11    Labs or Scans Ordered: No orders of the defined types were placed in this encounter.   Management:: No pathology associated with LLQ pain exam normal  Begin HRT for menopausal symptoms  Follow up Return in about 3 months (around 05/19/2018) for Follow up, with Dr Elonda Husky.      All questions were answered.

## 2018-02-21 ENCOUNTER — Telehealth: Payer: Self-pay | Admitting: *Deleted

## 2018-02-21 NOTE — Telephone Encounter (Signed)
[facility] approved.

## 2018-02-22 ENCOUNTER — Other Ambulatory Visit: Payer: Self-pay | Admitting: Urology

## 2018-02-22 DIAGNOSIS — N200 Calculus of kidney: Secondary | ICD-10-CM

## 2018-02-25 DIAGNOSIS — Z23 Encounter for immunization: Secondary | ICD-10-CM | POA: Diagnosis not present

## 2018-03-04 ENCOUNTER — Ambulatory Visit (INDEPENDENT_AMBULATORY_CARE_PROVIDER_SITE_OTHER): Payer: Medicare Other | Admitting: Urology

## 2018-03-04 ENCOUNTER — Other Ambulatory Visit (HOSPITAL_COMMUNITY)
Admission: RE | Admit: 2018-03-04 | Discharge: 2018-03-04 | Disposition: A | Discharge status: Home / Self Care | Payer: Medicare Other | Source: Ambulatory Visit | Attending: Urology | Admitting: Urology

## 2018-03-04 DIAGNOSIS — N3010 Interstitial cystitis (chronic) without hematuria: Secondary | ICD-10-CM

## 2018-03-04 DIAGNOSIS — N200 Calculus of kidney: Secondary | ICD-10-CM | POA: Diagnosis not present

## 2018-03-04 DIAGNOSIS — N3281 Overactive bladder: Secondary | ICD-10-CM | POA: Diagnosis not present

## 2018-03-04 LAB — URINALYSIS, COMPLETE (UACMP) WITH MICROSCOPIC
BILIRUBIN URINE: NEGATIVE
Glucose, UA: NEGATIVE mg/dL
KETONES UR: NEGATIVE mg/dL
NITRITE: POSITIVE — AB
Protein, ur: NEGATIVE mg/dL
SPECIFIC GRAVITY, URINE: 1.015 (ref 1.005–1.030)
pH: 6 (ref 5.0–8.0)

## 2018-03-05 LAB — URINE CULTURE: Culture: NO GROWTH

## 2018-03-07 ENCOUNTER — Other Ambulatory Visit: Payer: Self-pay | Admitting: Urology

## 2018-03-16 NOTE — Patient Instructions (Signed)
Bethany King  03/16/2018     @PREFPERIOPPHARMACY @   Your procedure is scheduled on  03/25/2018  Report to [facility] at  69  A.M.  Call this number if you have problems the morning of surgery:  [phone]   Remember:  Do not eat or drink after midnight.  You may drink clear liquids until  12 midnight 03/24/2018 .  Clear liquids allowed are:                    Water, Juice (non-citric and without pulp), Carbonated beverages, Clear Tea, Black Coffee only, Plain Jell-O only, Gatorade and Plain Popsicles only    Take these medicines the morning of surgery with A SIP OF WATER  Zyrtec, cymbalta, hydrocodone ( if needed), ditropan, pyridium, dyazide.    Do not wear jewelry, make-up or nail polish.  Do not wear lotions, powders, or perfumes, or deodorant.  Do not shave 48 hours prior to surgery.  Men may shave face and neck.  Do not bring valuables to the hospital.  [facility] is not responsible for any belongings or valuables.  Contacts, dentures or bridgework may not be worn into surgery.  Leave your suitcase in the car.  After surgery it may be brought to your room.  For patients admitted to the hospital, discharge time will be determined by your treatment team.  Patients discharged the day of surgery will not be allowed to drive home.   Name and phone number of your driver:   family Special instructions:  None  Please read over the following fact sheets that you were given. Anesthesia Post-op Instructions and Care and Recovery After Surgery       Hydrodistention of the Bladder Hydrodistention is a procedure to examine the bladder. During hydrodistention, the bladder is filled with germ-free (sterile) fluid until it is more full than normal (distended). This makes it easier to see the bladder walls clearly. To do this procedure, a thin, tube-shaped instrument with a light and a tiny camera at the end (cystoscope) is passed through the urethra and  into the bladder. The urethra is the tube that drains urine from the bladder out of the body. In men, the urethra opens at the end of the penis. In women, the urethra opens in front of the vaginal opening. This procedure is commonly done to help diagnose a condition that causes inflammation of the bladder (interstitial cystitis). It may also be done to remove a sample of bladder tissue to be checked under a microscope (biopsy). Tell a health care provider about:  Any allergies you have.  All medicines you are taking, including vitamins, herbs, eye drops, creams, and over-the-counter medicines.  Any problems you or family members have had with anesthetic medicines.  Any blood disorders you have.  Any surgeries you have had.  Any medical conditions you have.  Whether you are pregnant or may be pregnant. What are the risks? Generally, this is a safe procedure. However, problems may occur, including:  Infection.  Bleeding.  Allergic reactions to medicines.  Damage to the bladder or other structures or organs.  Difficulty urinating.  Temporary inability to urinate.  Worsening pain.  What happens before the procedure?  Ask your health care provider about: ? Changing or stopping your regular medicines. This is especially important if you are taking diabetes medicines or blood thinners. ? Taking medicines such as aspirin and  ibuprofen. These medicines can thin your blood. Do not take these medicines before your procedure if your health care provider instructs you not to.  Follow instructions from your health care provider about eating or drinking restrictions.  Do not use any tobacco products, such as cigarettes, chewing tobacco, and electronic cigarettes. If you need help quitting, ask your health care provider.  You may be given antibiotic medicine to help prevent infection.  You may have an exam or testing.  You may have a blood sample taken.  You may have urine tests to  check for signs of infection.  Plan to have someone take you home after the procedure.  If you will be going home right after the procedure, plan to have someone with you for 24 hours. What happens during the procedure?  To reduce your risk of infection: ? Your health care team will wash or sanitize their hands. ? The area around your urethra will be washed with soap.  An IV tube will be inserted into one of your veins.  You will be given one or more of the following: ? A medicine to help you relax (sedative). ? A medicine to numb the area (local anesthetic). ? A medicine to make you fall asleep (general anesthetic). ? A medicine that is injected into your spine to numb the area below and slightly above the injection site (spinal anesthetic).  The cystoscope will be inserted through your urethra and into your bladder.  Fluid will be pumped through the cystoscope and into your bladder until your bladder is distended. The amount of fluid that is needed to fill your bladder will be measured.  Your health care provider will look at the inside of your bladder. If a biopsy is needed, a sample of bladder tissue may be removed.  Fluid will be drained from your bladder, and the cystoscope will be removed. The procedure may vary among health care providers and hospitals. What happens after the procedure?  Your blood pressure, heart rate, breathing rate, and blood oxygen level will be monitored often until the medicines you were given have worn off.  You may continue to receive fluids and medicines through an IV tube.  You may have some blood in your urine.  Do not drive for 24 hours if you received a sedative.  You may have some soreness and discomfort in your urethra and lower abdomen. This information is not intended to replace advice given to you by your health care provider. Make sure you discuss any questions you have with your health care provider. Document Released: 02/24/2012  Document Revised: 11/07/2015 Document Reviewed: 03/13/2015 Elsevier Interactive Patient Education  2018 [place] Bladder, Care After Refer to this sheet in the next few weeks. These instructions provide you with information about caring for yourself after your procedure. Your health care provider may also give you more specific instructions. Your treatment has been planned according to current medical practices, but problems sometimes occur. Call your health care provider if you have any problems or questions after your procedure. What can I expect after the procedure? After the procedure, it is common to have:  Soreness and mild discomfort in your lower abdomen.  Mild pain when you urinate. Pain should stop within a few minutes after you urinate. This may last for up to a week.  A small amount of blood in your urine.  Follow these instructions at home: Medicines  Take over-the-counter and prescription medicines only as told  by your health care provider.  Do not drive for 24 hours if you received a sedative.  Do not drive or operate heavy machinery while taking prescription pain medicine.  If you were prescribed an antibiotic medicine, take it as told by your health care provider. Do not stop taking the antibiotic even if you start to feel better. Lifestyle  Limit alcohol intake to no more than 1 drink a day for nonpregnant women and 2 drinks a day for men. One drink equals 12 oz of beer, 5 oz of wine, or 1 oz of hard liquor.  Do not use any tobacco products, such as cigarettes, chewing tobacco, and e-cigarettes. If you need help quitting, ask your health care provider. Activity  Return to your normal activities as told by your health care provider. Ask your health care provider what activities are safe for you.  Do not lift anything that is heavier than 10 lb (4.5 kg) until your health care approves. Eating and drinking   Follow instructions from  your health care provider about eating or drinking restrictions.  Drink enough fluid to keep your urine clear or pale yellow. General instructions  Do not take baths, swim, or use a hot tub until your health care provider approves.  Wear compression stockings as told by your health care provider. These stockings help to prevent blood clots and reduce swelling in your legs.  If a tissue sample was removed for testing (biopsy) during your procedure, it is your responsibility to get your test results. Ask your health care provider or the department performing the test when your results will be ready.  Keep all follow-up visits as told by your health care provider. This is important. Contact a health care provider if:  You have blood clots in your urine.  You have pain that gets worse or does not get better with medicine, especially pain when you urinate.  You have difficulty urinating.  You feel nauseous or you vomit for more than 2 days after the procedure.  You have a fever. Get help right away if:  You have severe pain in your abdomen.  You cannot urinate. This information is not intended to replace advice given to you by your health care provider. Make sure you discuss any questions you have with your health care provider. Document Released: 05/13/2015 Document Revised: 11/07/2015 Document Reviewed: 03/13/2015 Elsevier Interactive Patient Education  [employer].  Cystoscopy Cystoscopy is a procedure that is used to help diagnose and sometimes treat conditions that affect that lower urinary tract. The lower urinary tract includes the bladder and the tube that drains urine from the bladder out of the body (urethra). Cystoscopy is performed with a thin, tube-shaped instrument with a light and camera at the end (cystoscope). The cystoscope may be hard (rigid) or flexible, depending on the goal of the procedure.The cystoscope is inserted through the urethra, into the  bladder. Cystoscopy may be recommended if you have:  Urinary tractinfections that keep coming back (recurring).  Blood in the urine (hematuria).  Loss of bladder control (urinary incontinence) or an overactive bladder.  Unusual cells found in a urine sample.  A blockage in the urethra.  Painful urination.  An abnormality in the bladder found during an intravenous pyelogram (IVP) or CT scan.  Cystoscopy may also be done to remove a sample of tissue to be examined under a microscope (biopsy). Tell a health care provider about:  Any allergies you have.  All medicines you are taking,  including vitamins, herbs, eye drops, creams, and over-the-counter medicines.  Any problems you or family members have had with anesthetic medicines.  Any blood disorders you have.  Any surgeries you have had.  Any medical conditions you have.  Whether you are pregnant or may be pregnant. What are the risks? Generally, this is a safe procedure. However, problems may occur, including:  Infection.  Bleeding.  Allergic reactions to medicines.  Damage to other structures or organs.  What happens before the procedure?  Ask your health care provider about: ? Changing or stopping your regular medicines. This is especially important if you are taking diabetes medicines or blood thinners. ? Taking medicines such as aspirin and ibuprofen. These medicines can thin your blood. Do not take these medicines before your procedure if your health care provider instructs you not to.  Follow instructions from your health care provider about eating or drinking restrictions.  You may be given antibiotic medicine to help prevent infection.  You may have an exam or testing, such as X-rays of the bladder, urethra, or kidneys.  You may have urine tests to check for signs of infection.  Plan to have someone take you home after the procedure. What happens during the procedure?  To reduce your risk of  infection,your health care team will wash or sanitize their hands.  You will be given one or more of the following: ? A medicine to help you relax (sedative). ? A medicine to numb the area (local anesthetic).  The area around the opening of your urethra will be cleaned.  The cystoscope will be passed through your urethra into your bladder.  Germ-free (sterile)fluid will flow through the cystoscope to fill your bladder. The fluid will stretch your bladder so that your surgeon can clearly examine your bladder walls.  The cystoscope will be removed and your bladder will be emptied. The procedure may vary among health care providers and hospitals. What happens after the procedure?  You may have some soreness or pain in your abdomen and urethra. Medicines will be available to help you.  You may have some blood in your urine.  Do not drive for 24 hours if you received a sedative. This information is not intended to replace advice given to you by your health care provider. Make sure you discuss any questions you have with your health care provider. Document Released: 05/29/2000 Document Revised: 10/10/2015 Document Reviewed: 04/18/2015 Elsevier Interactive Patient Education  2018 [employer].  Cystoscopy, Care After Refer to this sheet in the next few weeks. These instructions provide you with information about caring for yourself after your procedure. Your health care provider may also give you more specific instructions. Your treatment has been planned according to current medical practices, but problems sometimes occur. Call your health care provider if you have any problems or questions after your procedure. What can I expect after the procedure? After the procedure, it is common to have:  Mild pain when you urinate. Pain should stop within a few minutes after you urinate. This may last for up to 1 week.  A small amount of blood in your urine for several days.  Feeling like you  need to urinate but producing only a small amount of urine.  Follow these instructions at home:  Medicines  Take over-the-counter and prescription medicines only as told by your health care provider.  If you were prescribed an antibiotic medicine, take it as told by your health care provider. Do not stop taking the  antibiotic even if you start to feel better. General instructions   Return to your normal activities as told by your health care provider. Ask your health care provider what activities are safe for you.  Do not drive for 24 hours if you received a sedative.  Watch for any blood in your urine. If the amount of blood in your urine increases, call your health care provider.  Follow instructions from your health care provider about eating or drinking restrictions.  If a tissue sample was removed for testing (biopsy) during your procedure, it is your responsibility to get your test results. Ask your health care provider or the department performing the test when your results will be ready.  Drink enough fluid to keep your urine clear or pale yellow.  Keep all follow-up visits as told by your health care provider. This is important. Contact a health care provider if:  You have pain that gets worse or does not get better with medicine, especially pain when you urinate.  You have difficulty urinating. Get help right away if:  You have more blood in your urine.  You have blood clots in your urine.  You have abdominal pain.  You have a fever or chills.  You are unable to urinate. This information is not intended to replace advice given to you by your health care provider. Make sure you discuss any questions you have with your health care provider. Document Released: 12/19/2004 Document Revised: 11/07/2015 Document Reviewed: 04/18/2015 Elsevier Interactive Patient Education  2018 [place] Anesthesia, Adult General anesthesia is the use of medicines to make  a person "go to sleep" (be unconscious) for a medical procedure. General anesthesia is often recommended when a procedure:  Is long.  Requires you to be still or in an unusual position.  Is major and can cause you to lose blood.  Is impossible to do without general anesthesia.  The medicines used for general anesthesia are called general anesthetics. In addition to making you sleep, the medicines:  Prevent pain.  Control your blood pressure.  Relax your muscles.  Tell a health care provider about:  Any allergies you have.  All medicines you are taking, including vitamins, herbs, eye drops, creams, and over-the-counter medicines.  Any problems you or family members have had with anesthetic medicines.  Types of anesthetics you have had in the past.  Any bleeding disorders you have.  Any surgeries you have had.  Any medical conditions you have.  Any history of heart or lung conditions, such as heart failure, sleep apnea, or chronic obstructive pulmonary disease (COPD).  Whether you are pregnant or may be pregnant.  Whether you use tobacco, alcohol, marijuana, or street drugs.  Any history of Armed forces logistics/support/administrative officer.  Any history of depression or anxiety. What are the risks? Generally, this is a safe procedure. However, problems may occur, including:  Allergic reaction to anesthetics.  Lung and heart problems.  Inhaling food or liquids from your stomach into your lungs (aspiration).  Injury to nerves.  Waking up during your procedure and being unable to move (rare).  Extreme agitation or a state of mental confusion (delirium) when you wake up from the anesthetic.  Air in the bloodstream, which can lead to stroke.  These problems are more likely to develop if you are having a major surgery or if you have an advanced medical condition. You can prevent some of these complications by answering all of your health care provider's questions thoroughly and by  following all  pre-procedure instructions. General anesthesia can cause side effects, including:  Nausea or vomiting  A sore throat from the breathing tube.  Feeling cold or shivery.  Feeling tired, washed out, or achy.  Sleepiness or drowsiness.  Confusion or agitation.  What happens before the procedure? Staying hydrated Follow instructions from your health care provider about hydration, which may include:  Up to 2 hours before the procedure - you may continue to drink clear liquids, such as water, clear fruit juice, black coffee, and plain tea.  Eating and drinking restrictions Follow instructions from your health care provider about eating and drinking, which may include:  8 hours before the procedure - stop eating heavy meals or foods such as meat, fried foods, or fatty foods.  6 hours before the procedure - stop eating light meals or foods, such as toast or cereal.  6 hours before the procedure - stop drinking milk or drinks that contain milk.  2 hours before the procedure - stop drinking clear liquids.  Medicines  Ask your health care provider about: ? Changing or stopping your regular medicines. This is especially important if you are taking diabetes medicines or blood thinners. ? Taking medicines such as aspirin and ibuprofen. These medicines can thin your blood. Do not take these medicines before your procedure if your health care provider instructs you not to. ? Taking new dietary supplements or medicines. Do not take these during the week before your procedure unless your health care provider approves them.  If you are told to take a medicine or to continue taking a medicine on the day of the procedure, take the medicine with sips of water. General instructions   Ask if you will be going home the same day, the following day, or after a longer hospital stay. ? Plan to have someone take you home. ? Plan to have someone stay with you for the first 24 hours after you leave the  hospital or clinic.  For 3-6 weeks before the procedure, try not to use any tobacco products, such as cigarettes, chewing tobacco, and e-cigarettes.  You may brush your teeth on the morning of the procedure, but make sure to spit out the toothpaste. What happens during the procedure?  You will be given anesthetics through a mask and through an IV tube in one of your veins.  You may receive medicine to help you relax (sedative).  As soon as you are asleep, a breathing tube may be used to help you breathe.  An anesthesia specialist will stay with you throughout the procedure. He or she will help keep you comfortable and safe by continuing to give you medicines and adjusting the amount of medicine that you get. He or she will also watch your blood pressure, pulse, and oxygen levels to make sure that the anesthetics do not cause any problems.  If a breathing tube was used to help you breathe, it will be removed before you wake up. The procedure may vary among health care providers and hospitals. What happens after the procedure?  You will wake up, often slowly, after the procedure is complete, usually in a recovery area.  Your blood pressure, heart rate, breathing rate, and blood oxygen level will be monitored until the medicines you were given have worn off.  You may be given medicine to help you calm down if you feel anxious or agitated.  If you will be going home the same day, your health care provider may check to  make sure you can stand, drink, and urinate.  Your health care providers will treat your pain and side effects before you go home.  Do not drive for 24 hours if you received a sedative.  You may: ? Feel nauseous and vomit. ? Have a sore throat. ? Have mental slowness. ? Feel cold or shivery. ? Feel sleepy. ? Feel tired. ? Feel sore or achy, even in parts of your body where you did not have surgery. This information is not intended to replace advice given to you by  your health care provider. Make sure you discuss any questions you have with your health care provider. Document Released: 09/08/2007 Document Revised: 11/12/2015 Document Reviewed: 05/16/2015 Elsevier Interactive Patient Education  2018 [place] Anesthesia, Adult, Care After These instructions provide you with information about caring for yourself after your procedure. Your health care provider may also give you more specific instructions. Your treatment has been planned according to current medical practices, but problems sometimes occur. Call your health care provider if you have any problems or questions after your procedure. What can I expect after the procedure? After the procedure, it is common to have:  Vomiting.  A sore throat.  Mental slowness.  It is common to feel:  Nauseous.  Cold or shivery.  Sleepy.  Tired.  Sore or achy, even in parts of your body where you did not have surgery.  Follow these instructions at home: For at least 24 hours after the procedure:  Do not: ? Participate in activities where you could fall or become injured. ? Drive. ? Use heavy machinery. ? Drink alcohol. ? Take sleeping pills or medicines that cause drowsiness. ? Make important decisions or sign legal documents. ? Take care of children on your own.  Rest. Eating and drinking  If you vomit, drink water, juice, or soup when you can drink without vomiting.  Drink enough fluid to keep your urine clear or pale yellow.  Make sure you have little or no nausea before eating solid foods.  Follow the diet recommended by your health care provider. General instructions  Have a responsible adult stay with you until you are awake and alert.  Return to your normal activities as told by your health care provider. Ask your health care provider what activities are safe for you.  Take over-the-counter and prescription medicines only as told by your health care provider.  If  you smoke, do not smoke without supervision.  Keep all follow-up visits as told by your health care provider. This is important. Contact a health care provider if:  You continue to have nausea or vomiting at home, and medicines are not helpful.  You cannot drink fluids or start eating again.  You cannot urinate after 8-12 hours.  You develop a skin rash.  You have fever.  You have increasing redness at the site of your procedure. Get help right away if:  You have difficulty breathing.  You have chest pain.  You have unexpected bleeding.  You feel that you are having a life-threatening or urgent problem. This information is not intended to replace advice given to you by your health care provider. Make sure you discuss any questions you have with your health care provider. Document Released: 09/07/2000 Document Revised: 11/04/2015 Document Reviewed: 05/16/2015 Elsevier Interactive Patient Education  [employer].

## 2018-03-17 DIAGNOSIS — Z6829 Body mass index (BMI) 29.0-29.9, adult: Secondary | ICD-10-CM | POA: Diagnosis not present

## 2018-03-17 DIAGNOSIS — E663 Overweight: Secondary | ICD-10-CM | POA: Diagnosis not present

## 2018-03-17 DIAGNOSIS — I10 Essential (primary) hypertension: Secondary | ICD-10-CM | POA: Diagnosis not present

## 2018-03-17 DIAGNOSIS — Z1389 Encounter for screening for other disorder: Secondary | ICD-10-CM | POA: Diagnosis not present

## 2018-03-17 DIAGNOSIS — F419 Anxiety disorder, unspecified: Secondary | ICD-10-CM | POA: Diagnosis not present

## 2018-03-21 ENCOUNTER — Encounter (HOSPITAL_COMMUNITY): Payer: Self-pay

## 2018-03-21 ENCOUNTER — Other Ambulatory Visit: Payer: Self-pay

## 2018-03-21 ENCOUNTER — Encounter (HOSPITAL_COMMUNITY)
Admission: RE | Admit: 2018-03-21 | Discharge: 2018-03-21 | Disposition: A | Discharge status: Home / Self Care | Payer: Medicare Other | Source: Ambulatory Visit | Attending: Urology | Admitting: Urology

## 2018-03-21 DIAGNOSIS — Z01812 Encounter for preprocedural laboratory examination: Secondary | ICD-10-CM | POA: Insufficient documentation

## 2018-03-21 LAB — CBC
HCT: 35.3 % — ABNORMAL LOW (ref 36.0–46.0)
HEMOGLOBIN: 10.9 g/dL — AB (ref 12.0–15.0)
MCH: 29.9 pg (ref 26.0–34.0)
MCHC: 30.9 g/dL (ref 30.0–36.0)
MCV: 96.7 fL (ref 78.0–100.0)
Platelets: 330 10*3/uL (ref 150–400)
RBC: 3.65 MIL/uL — AB (ref 3.87–5.11)
RDW: 14.1 % (ref 11.5–15.5)
WBC: 7 10*3/uL (ref 4.0–10.5)

## 2018-03-21 LAB — BASIC METABOLIC PANEL
ANION GAP: 8 (ref 5–15)
BUN: 12 mg/dL (ref 6–20)
CHLORIDE: 103 mmol/L (ref 98–111)
CO2: 26 mmol/L (ref 22–32)
Calcium: 8.8 mg/dL — ABNORMAL LOW (ref 8.9–10.3)
Creatinine, Ser: 0.67 mg/dL (ref 0.44–1.00)
GFR calc Af Amer: >60 mL/min (ref >60)
GFR calc non Af Amer: >60 mL/min (ref >60)
Glucose, Bld: 70 mg/dL (ref 70–99)
POTASSIUM: 3.4 mmol/L — AB (ref 3.5–5.1)
SODIUM: 137 mmol/L (ref 135–145)

## 2018-03-21 LAB — HCG, SERUM, QUALITATIVE: PREG SERUM: NEGATIVE

## 2018-03-25 ENCOUNTER — Encounter (HOSPITAL_COMMUNITY)
Admission: RE | Disposition: A | Discharge status: Home / Self Care | Payer: Self-pay | Source: Ambulatory Visit | Attending: Urology

## 2018-03-25 ENCOUNTER — Ambulatory Visit (HOSPITAL_COMMUNITY): Payer: Medicare Other | Admitting: Anesthesiology

## 2018-03-25 ENCOUNTER — Encounter (HOSPITAL_COMMUNITY): Payer: Self-pay | Admitting: Registered Nurse

## 2018-03-25 ENCOUNTER — Ambulatory Visit (HOSPITAL_COMMUNITY)
Admission: RE | Admit: 2018-03-25 | Discharge: 2018-03-25 | Disposition: A | Discharge status: Home / Self Care | Payer: Medicare Other | Source: Ambulatory Visit | Attending: Urology | Admitting: Urology

## 2018-03-25 DIAGNOSIS — F329 Major depressive disorder, single episode, unspecified: Secondary | ICD-10-CM | POA: Diagnosis not present

## 2018-03-25 DIAGNOSIS — N3010 Interstitial cystitis (chronic) without hematuria: Secondary | ICD-10-CM | POA: Insufficient documentation

## 2018-03-25 DIAGNOSIS — R3989 Other symptoms and signs involving the genitourinary system: Secondary | ICD-10-CM | POA: Diagnosis not present

## 2018-03-25 DIAGNOSIS — Z882 Allergy status to sulfonamides status: Secondary | ICD-10-CM | POA: Insufficient documentation

## 2018-03-25 DIAGNOSIS — N3281 Overactive bladder: Secondary | ICD-10-CM | POA: Insufficient documentation

## 2018-03-25 DIAGNOSIS — Z79899 Other long term (current) drug therapy: Secondary | ICD-10-CM | POA: Insufficient documentation

## 2018-03-25 DIAGNOSIS — K589 Irritable bowel syndrome without diarrhea: Secondary | ICD-10-CM | POA: Diagnosis not present

## 2018-03-25 DIAGNOSIS — N200 Calculus of kidney: Secondary | ICD-10-CM | POA: Insufficient documentation

## 2018-03-25 DIAGNOSIS — Z888 Allergy status to other drugs, medicaments and biological substances status: Secondary | ICD-10-CM | POA: Insufficient documentation

## 2018-03-25 DIAGNOSIS — M1990 Unspecified osteoarthritis, unspecified site: Secondary | ICD-10-CM | POA: Insufficient documentation

## 2018-03-25 DIAGNOSIS — F419 Anxiety disorder, unspecified: Secondary | ICD-10-CM | POA: Insufficient documentation

## 2018-03-25 DIAGNOSIS — I10 Essential (primary) hypertension: Secondary | ICD-10-CM | POA: Insufficient documentation

## 2018-03-25 HISTORY — PX: CYSTO WITH HYDRODISTENSION: SHX5453

## 2018-03-25 SURGERY — CYSTOSCOPY, WITH BLADDER HYDRODISTENSION
Anesthesia: General | Site: Bladder

## 2018-03-25 MED ORDER — STERILE WATER FOR IRRIGATION IR SOLN
Status: DC | PRN
  Administered 2018-03-25: 3000 mL

## 2018-03-25 MED ORDER — MIDAZOLAM HCL 5 MG/5ML IJ SOLN
INTRAMUSCULAR | Status: DC | PRN
  Administered 2018-03-25: 2 mg via INTRAVENOUS

## 2018-03-25 MED ORDER — PROPOFOL 10 MG/ML IV BOLUS
INTRAVENOUS | Status: AC
  Filled 2018-03-25: qty 20

## 2018-03-25 MED ORDER — LIDOCAINE HCL (PF) 1 % IJ SOLN
INTRAMUSCULAR | Status: AC
  Filled 2018-03-25: qty 5

## 2018-03-25 MED ORDER — PHENAZOPYRIDINE HCL 200 MG PO TABS
ORAL | Status: DC | PRN
  Administered 2018-03-25: 30 mL via INTRAVESICAL

## 2018-03-25 MED ORDER — BUPIVACAINE HCL (PF) 0.25 % IJ SOLN
INTRAMUSCULAR | Status: AC
  Filled 2018-03-25: qty 60

## 2018-03-25 MED ORDER — DEXAMETHASONE SODIUM PHOSPHATE 4 MG/ML IJ SOLN
INTRAMUSCULAR | Status: AC
  Filled 2018-03-25: qty 1

## 2018-03-25 MED ORDER — ONDANSETRON HCL 4 MG/2ML IJ SOLN
INTRAMUSCULAR | Status: AC
  Filled 2018-03-25: qty 2

## 2018-03-25 MED ORDER — MIDAZOLAM HCL 2 MG/2ML IJ SOLN
INTRAMUSCULAR | Status: AC
  Filled 2018-03-25: qty 2

## 2018-03-25 MED ORDER — MIDAZOLAM HCL 2 MG/2ML IJ SOLN: 0.5000 mg | Freq: Once | INTRAMUSCULAR | Status: DC | PRN

## 2018-03-25 MED ORDER — LACTATED RINGERS IV SOLN
INTRAVENOUS | Status: DC
  Administered 2018-03-25: 12:00:00 via INTRAVENOUS

## 2018-03-25 MED ORDER — ONDANSETRON HCL 4 MG/2ML IJ SOLN
INTRAMUSCULAR | Status: DC | PRN
  Administered 2018-03-25: 4 mg via INTRAVENOUS

## 2018-03-25 MED ORDER — FENTANYL CITRATE (PF) 100 MCG/2ML IJ SOLN
INTRAMUSCULAR | Status: DC | PRN
  Administered 2018-03-25 (×2): 50 ug via INTRAVENOUS

## 2018-03-25 MED ORDER — DEXAMETHASONE SODIUM PHOSPHATE 10 MG/ML IJ SOLN
INTRAMUSCULAR | Status: DC | PRN
  Administered 2018-03-25: 4 mg via INTRAVENOUS

## 2018-03-25 MED ORDER — LIDOCAINE 2% (20 MG/ML) 5 ML SYRINGE
INTRAMUSCULAR | Status: DC | PRN
  Administered 2018-03-25: 50 mg via INTRAVENOUS

## 2018-03-25 MED ORDER — CEFAZOLIN SODIUM-DEXTROSE 2-4 GM/100ML-% IV SOLN
2.0000 g | INTRAVENOUS | Status: AC
  Administered 2018-03-25: 2 g via INTRAVENOUS
  Filled 2018-03-25: qty 100

## 2018-03-25 MED ORDER — FENTANYL CITRATE (PF) 100 MCG/2ML IJ SOLN
INTRAMUSCULAR | Status: AC
  Filled 2018-03-25: qty 2

## 2018-03-25 MED ORDER — PROMETHAZINE HCL 25 MG/ML IJ SOLN: 6.2500 mg | INTRAMUSCULAR | Status: DC | PRN

## 2018-03-25 MED ORDER — PROPOFOL 10 MG/ML IV BOLUS
INTRAVENOUS | Status: DC | PRN
  Administered 2018-03-25: 200 mg via INTRAVENOUS

## 2018-03-25 MED ORDER — OXYCODONE-ACETAMINOPHEN 5-325 MG PO TABS: 1.0000 | ORAL_TABLET | Freq: Four times a day (QID) | ORAL | 0 refills | Status: DC | PRN

## 2018-03-25 SURGICAL SUPPLY — 21 items
BAG DRAIN URO TABLE W/ADPT NS (BAG) ×2 IMPLANT
BAG DRN 8 ADPR NS SKTRN CSTL (BAG) ×1
CATH ROBINSON RED A/P 16FR (CATHETERS) ×2 IMPLANT
CATH ROBINSON RED A/P 18FR (CATHETERS) IMPLANT
CLOTH BEACON ORANGE TIMEOUT ST (SAFETY) ×4 IMPLANT
GLOVE BIO SURGEON STRL SZ7 (GLOVE) ×1 IMPLANT
GLOVE SURG SS PI 8.0 STRL IVOR (GLOVE) ×2 IMPLANT
GOWN STRL REUS W/TWL LRG LVL3 (GOWN DISPOSABLE) ×2 IMPLANT
KIT TURNOVER CYSTO (KITS) ×2 IMPLANT
MANIFOLD NEPTUNE II (INSTRUMENTS) ×2 IMPLANT
NDL SAFETY ECLIPSE 18X1.5 (NEEDLE) IMPLANT
NEEDLE HYPO 18GX1.5 SHARP (NEEDLE)
NS IRRIG 1000ML POUR BTL (IV SOLUTION) ×2 IMPLANT
PACK CYSTO (CUSTOM PROCEDURE TRAY) ×2 IMPLANT
PAD ARMBOARD 7.5X6 YLW CONV (MISCELLANEOUS) ×2 IMPLANT
PLUG CATH AND CAP STER (CATHETERS) IMPLANT
SYR 30ML LL (SYRINGE) IMPLANT
SYRINGE IRR TOOMEY STRL 70CC (SYRINGE) ×2 IMPLANT
TOWEL OR 17X24 6PK STRL BLUE (TOWEL DISPOSABLE) ×2 IMPLANT
TOWEL OR 17X26 4PK STRL BLUE (TOWEL DISPOSABLE) ×2 IMPLANT
WATER STERILE IRR 3000ML UROMA (IV SOLUTION) ×4 IMPLANT

## 2018-03-25 NOTE — Anesthesia Postprocedure Evaluation (Signed)
Anesthesia Post Note Late Entry for 1315  Patient: Bethany King  Procedure(s) Performed: CYSTOSCOPY/HYDRODISTENSION, INSTILL MARCAIN AND PYRIDIUM (N/A Bladder)  Patient location during evaluation: PACU Anesthesia Type: General Level of consciousness: awake and alert and oriented Pain management: pain level controlled Vital Signs Assessment: post-procedure vital signs reviewed and stable Respiratory status: spontaneous breathing Cardiovascular status: stable Postop Assessment: no headache Anesthetic complications: no     Last Vitals:  Vitals:   03/25/18 1330 03/25/18 1345  BP: 118/75 125/74  Pulse: 63 60  Resp: 19 18  Temp:    SpO2: 95% 95%    Last Pain:  Vitals:   03/25/18 1345  TempSrc:   PainSc: 0-No pain                 Kaelen Brennan A

## 2018-03-25 NOTE — Transfer of Care (Signed)
Immediate Anesthesia Transfer of Care Note  Patient: Bethany King  Procedure(s) Performed: CYSTOSCOPY/HYDRODISTENSION, INSTILL MARCAIN AND PYRIDIUM (N/A Bladder)  Patient Location: PACU  Anesthesia Type:General  Level of Consciousness: awake, alert  and oriented  Airway & Oxygen Therapy: Patient Spontanous Breathing  Post-op Assessment: Report given to RN and Post -op Vital signs reviewed and stable  Post vital signs: Reviewed and stable  Last Vitals:  Vitals Value Taken Time  BP 111/72 03/25/2018  1:10 PM  Temp    Pulse 71 03/25/2018  1:11 PM  Resp 17 03/25/2018  1:11 PM  SpO2 95 % 03/25/2018  1:11 PM  Vitals shown include unvalidated device data.  Last Pain:  Vitals:   03/25/18 1101  TempSrc: Oral  PainSc:          Complications: No apparent anesthesia complications

## 2018-03-25 NOTE — Anesthesia Procedure Notes (Signed)
Procedure Name: LMA Insertion Date/Time: 03/25/2018 12:39 PM Performed by: Talbot Grumbling, CRNA Pre-anesthesia Checklist: Patient identified, Emergency Drugs available, Suction available and Patient being monitored Patient Re-evaluated:Patient Re-evaluated prior to induction Oxygen Delivery Method: Circle system utilized Preoxygenation: Pre-oxygenation with 100% oxygen Induction Type: IV induction Ventilation: Mask ventilation without difficulty LMA: LMA inserted LMA Size: 4.0 Number of attempts: 1 Placement Confirmation: positive ETCO2 and breath sounds checked- equal and bilateral Tube secured with: Tape Dental Injury: Teeth and Oropharynx as per pre-operative assessment

## 2018-03-25 NOTE — Anesthesia Preprocedure Evaluation (Addendum)
Anesthesia Evaluation  Patient identified by MRN, date of birth, ID band Patient awake  General Assessment Comment:States requests Zofran   Reviewed: Allergy & Precautions, NPO status , Patient's Chart, lab work & pertinent test results  History of Anesthesia Complications (+) PONV  Airway Mallampati: I  TM Distance: >3 FB Neck ROM: Full    Dental no notable dental hx.  One R upper temp Crown in the back :   Pulmonary neg pulmonary ROS,    Pulmonary exam normal breath sounds clear to auscultation       Cardiovascular Exercise Tolerance: Good hypertension, Pt. on medications negative cardio ROS Normal cardiovascular examI Rhythm:Regular Rate:Normal     Neuro/Psych  Headaches, Anxiety Depression  Neuromuscular disease negative psych ROS   GI/Hepatic Neg liver ROS, GERD  Medicated and Controlled,  Endo/Other  negative endocrine ROS  Renal/GU Renal diseaseOAB,pain -here for cysto -Tx of same   negative genitourinary   Musculoskeletal  (+) Arthritis , Osteoarthritis,    Abdominal   Peds negative pediatric ROS (+)  Hematology negative hematology ROS (+)   Anesthesia Other Findings   Reproductive/Obstetrics negative OB ROS                             Anesthesia Physical Anesthesia Plan  ASA: II  Anesthesia Plan: General   Post-op Pain Management:    Induction: Intravenous  PONV Risk Score and Plan:   Airway Management Planned: Nasal Cannula and Simple Face Mask  Additional Equipment:   Intra-op Plan:   Post-operative Plan:   Informed Consent: I have reviewed the patients History and Physical, chart, labs and discussed the procedure including the risks, benefits and alternatives for the proposed anesthesia with the patient or authorized representative who has indicated his/her understanding and acceptance.   Dental advisory given  Plan Discussed with: CRNA  Anesthesia Plan  Comments: (Mask vs LMA vs ETT as needed )       Anesthesia Quick Evaluation

## 2018-03-25 NOTE — H&P (Signed)
CC: I have an overactive bladder.  HPI: Bethany King is a 47 year-old female established patient who is here for evaluation of their overactive bladder.  She is taking Oxybutinin for her over active bladder. She does have urgency. She does have problems getting to the bathroom in time after she has the urge to urinate. She does urinate more frequently than once every 4 hours in the daytime.   She is not satisfied with the way she is voiding. She does wear protective pads. She gets up at night to urinate 3 times. She is not having problems with emptying her bladder well.   03/04/18: Bethany King returns today in f/u for her history of stones and OAB. She had a urine culture in august that was negative and a KUB that showed stable small bilateral renal stones. She remains on oxybutynin and pyridium which help her voiding symptoms but not eliminate them and she doesn't take them all the time because of drowsiness. She has some incontinence with urge but not with coughing or lifting. She can have some post void dribbling. She has a sore bladder that is worse when full. She has IC and did benefit from an HOD in 2016. Her frequency is down to 10x and nocturia x 1. She has some dry mouth and constipation with the oxybutynin. Her UA today is nit+ and LE+ but she is on the pyridium.      ALLERGIES: HYDROmorphone HCl TABS Rapaflo CAPS Sulfa Drugs Ultram TABS Uribel CAPS Urocit-K 10 TBCR    MEDICATIONS: Oxybutynin Chloride 5 mg tablet 1 tablet PO TID  Cymbalta  Dyazide 37.5 mg-25 mg capsule 1 capsule PO Daily  Folic Acid 1 mg tablet 1 tablet PO Daily  Hydrocodone-Acetaminophen 10 mg-325 mg tablet 1 tablet PO QID PRN  Methotrexate 2.5 mg tablet  Potassium Chloride  Xanax 0.5 MG Oral Tablet 1 mg Oral TID PRN     GU PSH: Cysto Bladder Ureth Biopsy - 2016 Cystoscopy Hydrodistention - 2016 Cystoscopy Insert Stent - 2013, 2013 Cystoscopy Ureteroscopy - 2013, 2013 ESWL - 2010 PCNL - 2010      PSH  Notes: Bladder Irrigation, Cystoscopy With Dilation Of Bladder, Cystoscopy With Biopsy, Cholecystectomy, Cystoscopy With Insertion Of Ureteral Stent Right, Cystoscopy With Ureteroscopy Right, Cystoscopy With Ureteroscopy Left, Cystoscopy With Insertion Of Ureteral Stent Left, Fasciotomy Of The Foot, Neuroplasty Decompression Median Nerve At Carpal Tunnel, Lithotripsy, Foot Surgery, Percutaneous Lithotomy   NON-GU PSH: Carpal Tunnel Surgery.. - 2013 Cholecystectomy (open) - 2015 Incise Foot Fascia - 2013    GU PMH: LLQ pain - 01/28/2018 Overactive bladder, She has some increased OAB symptoms that are better with oxybutynin and Azo but she also has some left lower quadrant pain. With her history of stones, I will get a KUB and call her with the results. I have given her pyridium script today and she will return in 4-6 weeks. If she doesn't improve and doesn't have a stone, we may need to do a cystoscopy with HOD. - 01/28/2018, - 12/01/2017, OAB (overactive bladder), - 2016 Renal calculus - 12/01/2017, - 09/01/2017, - 07/07/2017, - 06/02/2017, - 03/18/2017, - 12/23/2016, - 06/10/2016, - 04/22/2016, - 03/18/2016 (Stable), Bilateral, Her stones are stable on KUB., - 2017, Bilateral kidney stones, - 2016 Mixed incontinence - 11/16/2017 Urinary Frequency (Stable) - 11/16/2017, Increased urinary frequency, - 2016 Urinary Urgency - 11/16/2017 Flank Pain - 09/01/2017, Left, - 03/18/2017 (Stable, Chronic), Bilateral, - 04/10/2016 Ureteral calculus - 03/13/2016, (Worsening), Right, She has right flank pain and  nausea and probably has a stone on the move., - 2017 Acute Cystitis/UTI - 2017, She may have a UTI or her symptoms could be from her IC or vaginitis. , - 2017 Acute vaginitis, She has a vaginal discharge and LE on UA. - 2017 Dysuria, Dysuria - 2016 Hemorrhagic cystitis, Acute cystitis with hematuria - 2016 Oth GU systems Signs/Symptoms, Bladder pain - 2016 Other microscopic hematuria, Microscopic hematuria -  2016 Urinary Retention, Unspec, Incomplete bladder emptying - 2016 Personal Hx Urinary Tract Infections, H/O bladder infections - 2015 LUQ pain, Abdominal Pain In The Left Upper Belly (LUQ) - 2014      PMH Notes:  2009-05-01 11:41:37 - Note: Arthritis   NON-GU PMH: Encounter for general adult medical examination without abnormal findings, Encounter for preventive health examination - 2016 Personal history of other diseases of the digestive system, History of irritable bowel syndrome - 2016, History of esophageal reflux, - 2014 Hypokalemia, Hypokalemia - 2015 Spinal stenosis, cervical region, Spinal stenosis of cervical region - 2015 Spinal stenosis, lumbar region, Spinal stenosis, lumbar - 2015 Anxiety, Anxiety (Symptom) - 2014 Chondrocostal junction syndrome [Tietze], Costochondritis (Tietze's Syndrome) - 2014 Personal history of other diseases of the circulatory system, History of hypertension - 2014 Personal history of other diseases of the musculoskeletal system and connective tissue, History of tendinitis - 2014, History of bursitis, - 2014 Personal history of other mental and behavioral disorders, History of depression - 2014 Personal history of other specified conditions, History of heartburn - 2014 Radiculopathy, thoracic region, Thoracic radiculopathy - 2014    FAMILY HISTORY: Arthritis - Runs In Family Blood In Urine - Runs In Family Family Health Status Number - No Family History Father Deceased At Age31 ___ - Mother Hypertension - Mother Malignant Melanoma Of The Skin - Runs In Family Mother Deceased At Age 29 from diabetic complicati - Mother renal failure - Mother scleroderma - Runs In Family   SOCIAL HISTORY: Marital Status: Divorced Preferred Language: English; Ethnicity: Not Hispanic Or Latino; Race: White Current Smoking Status: Patient has never smoked.   Tobacco Use Assessment Completed: Used Tobacco in last 30 days? Does not drink anymore.  Drinks 1  caffeinated drink per day. Patient's occupation is/was disabled.     Notes: Unemployed, Never A Smoker, Caffeine Use, Alcohol Use, Tobacco Use, Marital History - Divorced   REVIEW OF SYSTEMS:    GU Review Female:   Patient reports frequent urination, hard to postpone urination, burning /pain with urination, get up at night to urinate, and leakage of urine. Patient denies stream starts and stops, trouble starting your stream, have to strain to urinate, and being pregnant.  Gastrointestinal (Upper):   Patient denies nausea, vomiting, and indigestion/ heartburn.  Gastrointestinal (Lower):   Patient denies diarrhea and constipation.  Constitutional:   Patient denies fever, night sweats, weight loss, and fatigue.  Skin:   Patient denies skin rash/ lesion and itching.  Eyes:   Patient denies blurred vision and double vision.  Ears/ Nose/ Throat:   Patient denies sore throat and sinus problems.  Hematologic/Lymphatic:   Patient denies swollen glands and easy bruising.  Cardiovascular:   Patient denies leg swelling and chest pains.  Respiratory:   Patient denies cough and shortness of breath.  Endocrine:   Patient denies excessive thirst.  Musculoskeletal:   Patient reports back pain and joint pain.   Neurological:   Patient denies headaches and dizziness.  Psychologic:   Patient denies depression and anxiety.   VITAL SIGNS:  03/04/2018 01:43 PM  BP 104/70 mmHg  Pulse 78 /min  Temperature 98.2 F / 36.7 C   MULTI-SYSTEM PHYSICAL EXAMINATION:    Constitutional: Well-nourished. No physical deformities. Normally developed. Good grooming.  Respiratory: No labored breathing, no use of accessory muscles. CTA  Cardiovascular: Normal temperature, RRR without murmur, no swelling, no varicosities.      PAST DATA REVIEWED:  Source Of History:  Patient  Urine Test Review:   Urinalysis, Urine Culture  X-Ray Review: KUB: Reviewed Films. Reviewed Report. Discussed With Patient.     PROCEDURES:           Urinalysis - 81003 Dipstick Dipstick Cont'd  Specimen: Voided Bilirubin: Neg  Color: Yellow Ketones: Neg  Appearance: Clear Blood: moderate intact  Specific Gravity: >= 1.030 Protein: Trace  pH: 5.0 Urobilinogen: 0.2  Glucose: Neg Nitrites: Positive    Leukocyte Esterase: 2+    ASSESSMENT:      ICD-10 Details  1 GU:   Interstitial Cystitis (w/o hematuria) - N30.10 She continues to have OAB with pain and UUI. I am going to given her Myrbetriq 50mg  samples and have her set up for a cystoscopy with HOD. risks of bleeding, infection, bladder injury, retention, thrombotic events and anesthetic complications reviewed.   2   Overactive bladder - N32.81   3   Renal calculus - N20.0 Stable bilateral renal stones on KUB.    PLAN:            Medications Samples: Myrbetriq 50 mg tablet, extended release 24 hr 1 tablet PO Daily #14              Orders Labs Urinalysis w/Scope, CULTURE, URINE          Schedule Return Visit/Planned Activity: ASAP - Schedule Surgery             Note: Cystoscopy with HOD.           Document Letter(s):  Created for Patient: Clinical Summary         Next Appointment:      Next Appointment: 03/09/2018 03:15 PM    Appointment Type: Office Visit Established Patient    Location: [facility] - [id number]    Provider: [facility]    Reason for Visit: 3 mo followup w/ Renal US \\T \ KUB

## 2018-03-25 NOTE — Op Note (Signed)
Procedure: Cystoscopy with hydrodistention of the bladder and instillation of Pyridium and Marcaine.  Preop diagnosis: Interstitial cystitis.  Postop diagnosis: Same.  Anesthesia: General.  Surgeon: Dr. Irine Seal.  Drain: None.  Specimen: None.  EBL: None.  Complications: None.  Indications: Bethany King is a 47 year old white female with a history of interstitial cystitis who is had a flare of symptoms and is elected hydrodistention of the bladder for therapy.  Procedure: She was taken to the operating room where she was given 2 g of Ancef.  A general anesthetic was induced.  She was placed in lithotomy position and fitted with PAS hose.  Her perineum and genitalia were prepped with Betadine solution and she was draped in usual sterile fashion.  Cystoscopy was performed using a 23 [place] scope and 30 degree lens.  Examination revealed a normal urethra.  There was mild squamous metaplasia of the trigone.  Ureteral orifices were unremarkable.  The bladder wall was smooth and pale without tumor stones or inflammation.  The bladder was then distended under 80 cm water pressure to capacity and then drained.  Her capacity was 1000 mL.  Post drainage cystoscopy demonstrated a normal-appearing bladder wall.  The bladder was drained and a red rubber catheter was used to instill the bladder with 30 mL of quarter percent Marcaine with 400 mg of crushed Pyridium.  She was taken down from lithotomy position, her anesthetic was reversed and she was moved to recovery room in stable condition.  There were no complications.

## 2018-03-25 NOTE — Discharge Instructions (Signed)

## 2018-03-28 ENCOUNTER — Encounter (HOSPITAL_COMMUNITY): Payer: Self-pay | Admitting: Urology

## 2018-04-07 ENCOUNTER — Encounter (HOSPITAL_BASED_OUTPATIENT_CLINIC_OR_DEPARTMENT_OTHER): Payer: Self-pay

## 2018-04-07 ENCOUNTER — Ambulatory Visit (HOSPITAL_BASED_OUTPATIENT_CLINIC_OR_DEPARTMENT_OTHER): Admit: 2018-04-07 | Payer: Medicare Other | Admitting: Urology

## 2018-04-07 SURGERY — CYSTOSCOPY, WITH BLADDER HYDRODISTENSION
Anesthesia: General

## 2018-04-15 ENCOUNTER — Ambulatory Visit (INDEPENDENT_AMBULATORY_CARE_PROVIDER_SITE_OTHER): Payer: Medicare Other | Admitting: Urology

## 2018-04-15 ENCOUNTER — Other Ambulatory Visit (HOSPITAL_COMMUNITY)
Admission: AD | Admit: 2018-04-15 | Discharge: 2018-04-15 | Disposition: A | Discharge status: Home / Self Care | Payer: Medicare Other | Source: Other Acute Inpatient Hospital | Attending: Urology | Admitting: Urology

## 2018-04-15 DIAGNOSIS — N3946 Mixed incontinence: Secondary | ICD-10-CM | POA: Diagnosis not present

## 2018-04-15 DIAGNOSIS — R300 Dysuria: Secondary | ICD-10-CM

## 2018-04-15 DIAGNOSIS — N3010 Interstitial cystitis (chronic) without hematuria: Secondary | ICD-10-CM | POA: Diagnosis not present

## 2018-04-15 DIAGNOSIS — N3281 Overactive bladder: Secondary | ICD-10-CM

## 2018-04-15 LAB — URINALYSIS, COMPLETE (UACMP) WITH MICROSCOPIC
Bilirubin Urine: NEGATIVE
GLUCOSE, UA: NEGATIVE mg/dL
Ketones, ur: NEGATIVE mg/dL
Nitrite: NEGATIVE
Protein, ur: NEGATIVE mg/dL
SPECIFIC GRAVITY, URINE: 1.004 — AB (ref 1.005–1.030)
pH: 8 (ref 5.0–8.0)

## 2018-04-16 LAB — URINE CULTURE: Culture: NO GROWTH

## 2018-05-04 DIAGNOSIS — H6993 Unspecified Eustachian tube disorder, bilateral: Secondary | ICD-10-CM | POA: Diagnosis not present

## 2018-05-04 DIAGNOSIS — J019 Acute sinusitis, unspecified: Secondary | ICD-10-CM | POA: Diagnosis not present

## 2018-05-04 DIAGNOSIS — E6609 Other obesity due to excess calories: Secondary | ICD-10-CM | POA: Diagnosis not present

## 2018-05-04 DIAGNOSIS — Z6830 Body mass index (BMI) 30.0-30.9, adult: Secondary | ICD-10-CM | POA: Diagnosis not present

## 2018-05-06 DIAGNOSIS — M25579 Pain in unspecified ankle and joints of unspecified foot: Secondary | ICD-10-CM | POA: Diagnosis not present

## 2018-05-06 DIAGNOSIS — M79643 Pain in unspecified hand: Secondary | ICD-10-CM | POA: Diagnosis not present

## 2018-05-06 DIAGNOSIS — M1990 Unspecified osteoarthritis, unspecified site: Secondary | ICD-10-CM | POA: Diagnosis not present

## 2018-05-06 DIAGNOSIS — L4050 Arthropathic psoriasis, unspecified: Secondary | ICD-10-CM | POA: Diagnosis not present

## 2018-05-06 DIAGNOSIS — L409 Psoriasis, unspecified: Secondary | ICD-10-CM | POA: Diagnosis not present

## 2018-05-06 DIAGNOSIS — Z79899 Other long term (current) drug therapy: Secondary | ICD-10-CM | POA: Diagnosis not present

## 2018-05-23 ENCOUNTER — Ambulatory Visit: Payer: Medicare Other | Admitting: Obstetrics & Gynecology

## 2018-05-24 ENCOUNTER — Ambulatory Visit (INDEPENDENT_AMBULATORY_CARE_PROVIDER_SITE_OTHER): Payer: Medicare Other | Admitting: Obstetrics & Gynecology

## 2018-05-24 ENCOUNTER — Encounter: Payer: Self-pay | Admitting: Obstetrics & Gynecology

## 2018-05-24 VITALS — BP 128/83 | HR 73 | Ht 63.0 in | Wt 173.0 lb

## 2018-05-24 DIAGNOSIS — N951 Menopausal and female climacteric states: Secondary | ICD-10-CM

## 2018-05-24 MED ORDER — ESTRADIOL 2 MG PO TABS: 2.0000 mg | ORAL_TABLET | Freq: Every day | ORAL | 11 refills | Status: DC

## 2018-05-24 MED ORDER — MEDROXYPROGESTERONE ACETATE 2.5 MG PO TABS: 2.5000 mg | ORAL_TABLET | Freq: Every day | ORAL | 11 refills | Status: DC

## 2018-05-24 NOTE — Progress Notes (Signed)
Chief Complaint  Patient presents with  . Follow-up      47 y.o. G0P0 No LMP recorded. Patient has had an ablation. The current method of family planning is none.  Outpatient Encounter Medications as of 05/24/2018  Medication Sig  . ALPRAZolam (XANAX) 1 MG tablet Take 1 mg by mouth at bedtime.   . calcium carbonate (TUMS - DOSED IN MG ELEMENTAL CALCIUM) 500 MG chewable tablet Chew 2-3 tablets by mouth daily as needed for indigestion or heartburn.  . DULoxetine (CYMBALTA) 60 MG capsule Take 60 mg by mouth daily.  . folic acid (FOLVITE) 1 MG tablet Take 2 mg by mouth daily.   . methotrexate (RHEUMATREX) 2.5 MG tablet Take 10 mg by mouth every Sunday.   . Multiple Vitamin (MULTIVITAMIN WITH MINERALS) TABS tablet Take 1 tablet by mouth daily.  Marland Kitchen oxyCODONE-acetaminophen (PERCOCET) 5-325 MG tablet Take 1 tablet by mouth every 6 (six) hours as needed for severe pain.  . potassium chloride SA (K-DUR,KLOR-CON) 20 MEQ tablet Take 40 mEq by mouth daily.   Marland Kitchen triamterene-hydrochlorothiazide (DYAZIDE) 37.5-25 MG capsule Take 1 capsule by mouth daily.   . cetirizine (ZYRTEC) 10 MG tablet Take 10 mg by mouth daily.  Marland Kitchen Conj Estrogens-Bazedoxifene 0.45-20 MG TABS Take 1 tablet by mouth daily. Take 1 tablet daily (Patient not taking: Reported on 03/15/2018)  . estradiol (ESTRACE) 2 MG tablet Take 1 tablet (2 mg total) by mouth daily.  . medroxyPROGESTERone (PROVERA) 2.5 MG tablet Take 1 tablet (2.5 mg total) by mouth daily.  Marland Kitchen oxybutynin (DITROPAN) 5 MG tablet Take 5 mg by mouth 2 (two) times daily as needed for bladder spasms.   . phenazopyridine (PYRIDIUM) 200 MG tablet Take 1 tablet (200 mg total) by mouth 3 (three) times daily as needed for pain. (Patient not taking: Reported on 05/24/2018)   No facility-administered encounter medications on file as of 05/24/2018.     Subjective Pt could not afford the duvee it was working on her menopausal symptoms given samples with good results Now  having vaginal burning as well Will switch to oral estradiol 2 mg + provera 2.5 mg daily Past Medical History:  Diagnosis Date  . Allergic rhinitis   . Anxiety   . Arthritis   . Bladder pain   . Complication of anesthesia   . Costochondritis    hx  . DDD (degenerative disc disease), lumbar   . Depression   . GERD (gastroesophageal reflux disease)   . Hepatic hemangioma 2006   stable  . History of adenomatous polyp of colon   . History of colitis   . History of gastritis   . History of kidney stones   . HTN (hypertension)   . Irritable bowel syndrome (IBS)   . Medullary sponge kidney    left  . Migraine   . Nephrolithiasis    bilateral-- nonobstructive per CT  . PONV (postoperative nausea and vomiting)   . Psoriatic arthritis ([place])   . Psoriatic arthritis ([place])   . Spinal stenosis   . Urgency of urination     Past Surgical History:  Procedure Laterality Date  . ANTERIOR CERVICAL DECOMP/DISCECTOMY FUSION  02/26/2014   C5 - C6; fusion with plate  . CARPAL TUNNEL RELEASE Bilateral right 04-01-2010/  left 05-30-2010  . CHOLECYSTECTOMY N/A 07/14/2013   Procedure: LAPAROSCOPIC CHOLECYSTECTOMY;  Surgeon: Jamesetta So, MD;  Location: [facility];  Service: General;  Laterality: N/A;  . COLONOSCOPY  10-13-2010  . CYSTO WITH  HYDRODISTENSION N/A 04/02/2015   Procedure: CYSTOSCOPY/HYDRODISTENSION, BLADDER BIOPSY WITH FULGURATION;  Surgeon: Irine Seal, MD;  Location: [facility];  Service: Urology;  Laterality: N/A;  . CYSTO WITH HYDRODISTENSION N/A 03/25/2018   Procedure: CYSTOSCOPY/HYDRODISTENSION, INSTILL MARCAIN AND PYRIDIUM;  Surgeon: Irine Seal, MD;  Location: [facility];  Service: Urology;  Laterality: N/A;  . CYSTOSCOPY W/ URETERAL STENT PLACEMENT Bilateral 04/08/2016   Procedure: CYSTOSCOPY WITH BILATERAL RETROGRADE PYELOGRAM, BILATERAL URETERAL STENT PLACEMENT;  Surgeon: Cleon Gustin, MD;  Location: [facility];  Service: Urology;  Laterality: Bilateral;  .  CYSTOSCOPY W/ URETERAL STENT PLACEMENT Left 04/15/2016   Procedure: CYSTOSCOPY WITH RETROGRADE PYELOGRAM/URETERAL STENT PLACEMENT;  Surgeon: Cleon Gustin, MD;  Location: [facility];  Service: Urology;  Laterality: Left;  . CYSTOSCOPY WITH HOLMIUM LASER LITHOTRIPSY Right 04/08/2016   Procedure: CYSTOSCOPY WITH RIGHT RENAL STONE EXTRACTION WITH HOLMIUM LASER LITHOTRIPSY;  Surgeon: Cleon Gustin, MD;  Location: [facility];  Service: Urology;  Laterality: Right;  . CYSTOSCOPY WITH HOLMIUM LASER LITHOTRIPSY Left 04/15/2016   Procedure: CYSTOSCOPY WITH HOLMIUM LASER LITHOTRIPSY;  Surgeon: Cleon Gustin, MD;  Location: [facility];  Service: Urology;  Laterality: Left;  . CYSTOSCOPY WITH RETROGRADE PYELOGRAM, URETEROSCOPY AND STENT PLACEMENT Left 07/05/2017   Procedure: CYSTOSCOPY WITH RETROGRADE PYELOGRAM, URETEROSCOPY AND STENT PLACEMENT;  Surgeon: Cleon Gustin, MD;  Location: [facility];  Service: Urology;  Laterality: Left;  . DIAGNOSTIC LAPAROSCOPY    . DILATION AND CURETTAGE OF UTERUS    . DILITATION & CURRETTAGE/HYSTROSCOPY WITH THERMACHOICE ABLATION N/A 09/07/2012   Procedure: DILATATION & CURETTAGE/HYSTEROSCOPY WITH THERMACHOICE ABLATION;  Surgeon: Florian Buff, MD;  Location: [facility];  Service: Gynecology;  Laterality: N/A;  . ESOPHAGOGASTRODUODENOSCOPY  10/14/10  . EXTRACORPOREAL SHOCK WAVE LITHOTRIPSY  multiple  . HOLMIUM LASER APPLICATION Left 9/50/9326   Procedure: HOLMIUM LASER APPLICATION;  Surgeon: Cleon Gustin, MD;  Location: [facility];  Service: Urology;  Laterality: Left;  . PERCUTANEOUS NEPHROSTOLITHOTOMY  2003  . PLANTAR FASCIA SURGERY Bilateral right 2008//  left 2010  . STONE EXTRACTION WITH BASKET Left 04/15/2016   Procedure: STONE EXTRACTION WITH BASKET;  Surgeon: Cleon Gustin, MD;  Location: [facility];  Service: Urology;  Laterality: Left;    OB History    Gravida  0   Para      Term      Preterm      AB      Living  0     SAB      TAB      Ectopic       Multiple      Live Births              Allergies  Allergen Reactions  . Hydromorphone Itching  . Prednisone Other (See Comments)    Increase anxiety symptoms, insomnia  . Tramadol Hcl Other (See Comments)    dizziness  . Sulfa Antibiotics Rash    Social History   Socioeconomic History  . Marital status: Divorced    Spouse name: Not on file  . Number of children: 0  . Years of education: Not on file  . Highest education level: Not on file  Occupational History  . Occupation: ORDER Secretary/administrator: [employer]  Social Needs  . Financial resource strain: Not on file  . Food insecurity:    Worry: Not on file    Inability: Not on file  . Transportation needs:    Medical: Not on file  Non-medical: Not on file  Tobacco Use  . Smoking status: Never Smoker  . Smokeless tobacco: Never Used  Substance and Sexual Activity  . Alcohol use: No  . Drug use: No  . Sexual activity: Yes    Birth control/protection: Surgical    Comment: ablation  Lifestyle  . Physical activity:    Days per week: Not on file    Minutes per session: Not on file  . Stress: Not on file  Relationships  . Social connections:    Talks on phone: Not on file    Gets together: Not on file    Attends religious service: Not on file    Active member of club or organization: Not on file    Attends meetings of clubs or organizations: Not on file    Relationship status: Not on file  Other Topics Concern  . Not on file  Social History Narrative  . Not on file    Family History  Problem Relation Age of Onset  . Irritable bowel syndrome Mother   . Scleroderma Mother   . Rheum arthritis Mother   . Asthma Mother   . Melanoma Father   . Cancer Father        Melanoma  . Colon cancer Cousin 7       second cousin [place])  . Cancer Maternal Grandmother        Ovarian  . Diabetes Maternal Grandmother   . Thyroid disease Maternal Aunt     Medications:       Current  Outpatient Medications:  .  ALPRAZolam (XANAX) 1 MG tablet, Take 1 mg by mouth at bedtime. , Disp: , Rfl:  .  calcium carbonate (TUMS - DOSED IN MG ELEMENTAL CALCIUM) 500 MG chewable tablet, Chew 2-3 tablets by mouth daily as needed for indigestion or heartburn., Disp: , Rfl:  .  DULoxetine (CYMBALTA) 60 MG capsule, Take 60 mg by mouth daily., Disp: , Rfl:  .  folic acid (FOLVITE) 1 MG tablet, Take 2 mg by mouth daily. , Disp: , Rfl: 3 .  methotrexate (RHEUMATREX) 2.5 MG tablet, Take 10 mg by mouth every Sunday. , Disp: , Rfl: 3 .  Multiple Vitamin (MULTIVITAMIN WITH MINERALS) TABS tablet, Take 1 tablet by mouth daily., Disp: , Rfl:  .  oxyCODONE-acetaminophen (PERCOCET) 5-325 MG tablet, Take 1 tablet by mouth every 6 (six) hours as needed for severe pain., Disp: 8 tablet, Rfl: 0 .  potassium chloride SA (K-DUR,KLOR-CON) 20 MEQ tablet, Take 40 mEq by mouth daily. , Disp: , Rfl:  .  triamterene-hydrochlorothiazide (DYAZIDE) 37.5-25 MG capsule, Take 1 capsule by mouth daily. , Disp: , Rfl: 11 .  cetirizine (ZYRTEC) 10 MG tablet, Take 10 mg by mouth daily., Disp: , Rfl:  .  Conj Estrogens-Bazedoxifene 0.45-20 MG TABS, Take 1 tablet by mouth daily. Take 1 tablet daily (Patient not taking: Reported on 03/15/2018), Disp: 30 tablet, Rfl: 11 .  estradiol (ESTRACE) 2 MG tablet, Take 1 tablet (2 mg total) by mouth daily., Disp: 30 tablet, Rfl: 11 .  medroxyPROGESTERone (PROVERA) 2.5 MG tablet, Take 1 tablet (2.5 mg total) by mouth daily., Disp: 30 tablet, Rfl: 11 .  oxybutynin (DITROPAN) 5 MG tablet, Take 5 mg by mouth 2 (two) times daily as needed for bladder spasms. , Disp: , Rfl: 6 .  phenazopyridine (PYRIDIUM) 200 MG tablet, Take 1 tablet (200 mg total) by mouth 3 (three) times daily as needed for pain. (Patient not taking: Reported on 05/24/2018),  Disp: 30 tablet, Rfl: 0  Objective Blood pressure 128/83, pulse 73, height 5\' 3"  (1.6 m), weight 173 lb (78.5 kg).  Gen WDWN NAD  Pertinent ROS   Labs  or studies     Impression Diagnoses this Encounter::   ICD-10-CM   1. Menopausal syndrome (hot flashes) N95.1     Established relevant diagnosis(es):   Plan/Recommendations: Meds ordered this encounter  Medications  . estradiol (ESTRACE) 2 MG tablet    Sig: Take 1 tablet (2 mg total) by mouth daily.    Dispense:  30 tablet    Refill:  11  . medroxyPROGESTERone (PROVERA) 2.5 MG tablet    Sig: Take 1 tablet (2.5 mg total) by mouth daily.    Dispense:  30 tablet    Refill:  11    Labs or Scans Ordered: No orders of the defined types were placed in this encounter.   Management:: Could not afford duavee which was working swithc to daily E2 + provera 2.5 mg  Follow up Return if symptoms worsen or fail to improve.        Face to face time:  15 minutes  Greater than 50% of the visit time was spent in counseling and coordination of care with the patient.  The summary and outline of the counseling and care coordination is summarized in the note above.   All questions were answered.

## 2018-05-26 DIAGNOSIS — R928 Other abnormal and inconclusive findings on diagnostic imaging of breast: Secondary | ICD-10-CM | POA: Diagnosis not present

## 2018-05-26 DIAGNOSIS — Z1231 Encounter for screening mammogram for malignant neoplasm of breast: Secondary | ICD-10-CM | POA: Diagnosis not present

## 2018-06-01 DIAGNOSIS — M47816 Spondylosis without myelopathy or radiculopathy, lumbar region: Secondary | ICD-10-CM | POA: Diagnosis not present

## 2018-06-01 DIAGNOSIS — M47814 Spondylosis without myelopathy or radiculopathy, thoracic region: Secondary | ICD-10-CM | POA: Diagnosis not present

## 2018-06-14 DIAGNOSIS — H6993 Unspecified Eustachian tube disorder, bilateral: Secondary | ICD-10-CM | POA: Diagnosis not present

## 2018-06-14 DIAGNOSIS — Z6830 Body mass index (BMI) 30.0-30.9, adult: Secondary | ICD-10-CM | POA: Diagnosis not present

## 2018-06-14 DIAGNOSIS — E6609 Other obesity due to excess calories: Secondary | ICD-10-CM | POA: Diagnosis not present

## 2018-06-14 DIAGNOSIS — J019 Acute sinusitis, unspecified: Secondary | ICD-10-CM | POA: Diagnosis not present

## 2018-06-20 DIAGNOSIS — M25579 Pain in unspecified ankle and joints of unspecified foot: Secondary | ICD-10-CM | POA: Diagnosis not present

## 2018-06-20 DIAGNOSIS — Z79899 Other long term (current) drug therapy: Secondary | ICD-10-CM | POA: Diagnosis not present

## 2018-06-20 DIAGNOSIS — L4050 Arthropathic psoriasis, unspecified: Secondary | ICD-10-CM | POA: Diagnosis not present

## 2018-06-20 DIAGNOSIS — L409 Psoriasis, unspecified: Secondary | ICD-10-CM | POA: Diagnosis not present

## 2018-06-20 DIAGNOSIS — M79643 Pain in unspecified hand: Secondary | ICD-10-CM | POA: Diagnosis not present

## 2018-06-20 DIAGNOSIS — M1990 Unspecified osteoarthritis, unspecified site: Secondary | ICD-10-CM | POA: Diagnosis not present

## 2018-06-22 DIAGNOSIS — N6489 Other specified disorders of breast: Secondary | ICD-10-CM | POA: Diagnosis not present

## 2018-06-22 DIAGNOSIS — R928 Other abnormal and inconclusive findings on diagnostic imaging of breast: Secondary | ICD-10-CM | POA: Diagnosis not present

## 2018-07-19 DIAGNOSIS — I10 Essential (primary) hypertension: Secondary | ICD-10-CM | POA: Diagnosis not present

## 2018-07-19 DIAGNOSIS — Z6830 Body mass index (BMI) 30.0-30.9, adult: Secondary | ICD-10-CM | POA: Diagnosis not present

## 2018-07-19 DIAGNOSIS — E669 Obesity, unspecified: Secondary | ICD-10-CM | POA: Diagnosis not present

## 2018-07-19 DIAGNOSIS — G894 Chronic pain syndrome: Secondary | ICD-10-CM | POA: Diagnosis not present

## 2018-07-19 DIAGNOSIS — J329 Chronic sinusitis, unspecified: Secondary | ICD-10-CM | POA: Diagnosis not present

## 2018-07-19 DIAGNOSIS — Z1389 Encounter for screening for other disorder: Secondary | ICD-10-CM | POA: Diagnosis not present

## 2018-07-27 ENCOUNTER — Ambulatory Visit (HOSPITAL_COMMUNITY)
Admission: RE | Admit: 2018-07-27 | Discharge: 2018-07-27 | Disposition: A | Discharge status: Home / Self Care | Payer: Medicare HMO | Source: Ambulatory Visit | Attending: Physician Assistant | Admitting: Physician Assistant

## 2018-07-27 ENCOUNTER — Other Ambulatory Visit (HOSPITAL_COMMUNITY): Payer: Self-pay | Admitting: Physician Assistant

## 2018-07-27 DIAGNOSIS — E6609 Other obesity due to excess calories: Secondary | ICD-10-CM | POA: Diagnosis not present

## 2018-07-27 DIAGNOSIS — R0602 Shortness of breath: Secondary | ICD-10-CM | POA: Diagnosis not present

## 2018-07-27 DIAGNOSIS — Z6830 Body mass index (BMI) 30.0-30.9, adult: Secondary | ICD-10-CM | POA: Diagnosis not present

## 2018-07-27 DIAGNOSIS — R05 Cough: Secondary | ICD-10-CM | POA: Diagnosis not present

## 2018-08-05 DIAGNOSIS — R69 Illness, unspecified: Secondary | ICD-10-CM | POA: Diagnosis not present

## 2018-08-23 DIAGNOSIS — L4050 Arthropathic psoriasis, unspecified: Secondary | ICD-10-CM | POA: Diagnosis not present

## 2018-08-23 DIAGNOSIS — L409 Psoriasis, unspecified: Secondary | ICD-10-CM | POA: Diagnosis not present

## 2018-08-23 DIAGNOSIS — M25579 Pain in unspecified ankle and joints of unspecified foot: Secondary | ICD-10-CM | POA: Diagnosis not present

## 2018-08-23 DIAGNOSIS — M1990 Unspecified osteoarthritis, unspecified site: Secondary | ICD-10-CM | POA: Diagnosis not present

## 2018-08-23 DIAGNOSIS — Z79899 Other long term (current) drug therapy: Secondary | ICD-10-CM | POA: Diagnosis not present

## 2018-08-23 DIAGNOSIS — M940 Chondrocostal junction syndrome [Tietze]: Secondary | ICD-10-CM | POA: Diagnosis not present

## 2018-08-23 DIAGNOSIS — M79643 Pain in unspecified hand: Secondary | ICD-10-CM | POA: Diagnosis not present

## 2018-10-14 DIAGNOSIS — B373 Candidiasis of vulva and vagina: Secondary | ICD-10-CM | POA: Diagnosis not present

## 2018-10-31 DIAGNOSIS — E7849 Other hyperlipidemia: Secondary | ICD-10-CM | POA: Diagnosis not present

## 2018-10-31 DIAGNOSIS — B373 Candidiasis of vulva and vagina: Secondary | ICD-10-CM | POA: Diagnosis not present

## 2018-10-31 DIAGNOSIS — N952 Postmenopausal atrophic vaginitis: Secondary | ICD-10-CM | POA: Diagnosis not present

## 2018-10-31 DIAGNOSIS — R509 Fever, unspecified: Secondary | ICD-10-CM | POA: Diagnosis not present

## 2018-10-31 DIAGNOSIS — Z6830 Body mass index (BMI) 30.0-30.9, adult: Secondary | ICD-10-CM | POA: Diagnosis not present

## 2018-10-31 DIAGNOSIS — N390 Urinary tract infection, site not specified: Secondary | ICD-10-CM | POA: Diagnosis not present

## 2018-10-31 DIAGNOSIS — Z79899 Other long term (current) drug therapy: Secondary | ICD-10-CM | POA: Diagnosis not present

## 2018-10-31 DIAGNOSIS — R69 Illness, unspecified: Secondary | ICD-10-CM | POA: Diagnosis not present

## 2018-11-11 ENCOUNTER — Other Ambulatory Visit (HOSPITAL_COMMUNITY): Payer: Self-pay | Admitting: Urology

## 2018-11-11 ENCOUNTER — Ambulatory Visit (INDEPENDENT_AMBULATORY_CARE_PROVIDER_SITE_OTHER): Payer: Medicare HMO | Admitting: Urology

## 2018-11-11 ENCOUNTER — Other Ambulatory Visit: Payer: Self-pay

## 2018-11-11 ENCOUNTER — Ambulatory Visit (HOSPITAL_COMMUNITY)
Admission: RE | Admit: 2018-11-11 | Discharge: 2018-11-11 | Disposition: A | Discharge status: Home / Self Care | Payer: Medicare HMO | Source: Ambulatory Visit | Attending: Urology | Admitting: Urology

## 2018-11-11 DIAGNOSIS — Z87440 Personal history of urinary (tract) infections: Secondary | ICD-10-CM

## 2018-11-11 DIAGNOSIS — N200 Calculus of kidney: Secondary | ICD-10-CM | POA: Diagnosis not present

## 2018-11-11 DIAGNOSIS — N3010 Interstitial cystitis (chronic) without hematuria: Secondary | ICD-10-CM

## 2018-11-11 DIAGNOSIS — N3281 Overactive bladder: Secondary | ICD-10-CM | POA: Diagnosis not present

## 2018-11-21 DIAGNOSIS — M62830 Muscle spasm of back: Secondary | ICD-10-CM | POA: Diagnosis not present

## 2018-11-21 DIAGNOSIS — M545 Low back pain: Secondary | ICD-10-CM | POA: Diagnosis not present

## 2018-11-21 DIAGNOSIS — E669 Obesity, unspecified: Secondary | ICD-10-CM | POA: Diagnosis not present

## 2018-11-21 DIAGNOSIS — I10 Essential (primary) hypertension: Secondary | ICD-10-CM | POA: Diagnosis not present

## 2018-11-21 DIAGNOSIS — M5136 Other intervertebral disc degeneration, lumbar region: Secondary | ICD-10-CM | POA: Diagnosis not present

## 2018-11-21 DIAGNOSIS — Z6831 Body mass index (BMI) 31.0-31.9, adult: Secondary | ICD-10-CM | POA: Diagnosis not present

## 2018-11-21 DIAGNOSIS — Z1389 Encounter for screening for other disorder: Secondary | ICD-10-CM | POA: Diagnosis not present

## 2018-11-23 DIAGNOSIS — L4050 Arthropathic psoriasis, unspecified: Secondary | ICD-10-CM | POA: Diagnosis not present

## 2018-11-23 DIAGNOSIS — M25579 Pain in unspecified ankle and joints of unspecified foot: Secondary | ICD-10-CM | POA: Diagnosis not present

## 2018-11-23 DIAGNOSIS — Z79899 Other long term (current) drug therapy: Secondary | ICD-10-CM | POA: Diagnosis not present

## 2018-11-23 DIAGNOSIS — M79643 Pain in unspecified hand: Secondary | ICD-10-CM | POA: Diagnosis not present

## 2018-11-23 DIAGNOSIS — L409 Psoriasis, unspecified: Secondary | ICD-10-CM | POA: Diagnosis not present

## 2018-11-23 DIAGNOSIS — M1990 Unspecified osteoarthritis, unspecified site: Secondary | ICD-10-CM | POA: Diagnosis not present

## 2018-11-24 DIAGNOSIS — L409 Psoriasis, unspecified: Secondary | ICD-10-CM | POA: Diagnosis not present

## 2018-11-24 DIAGNOSIS — L4050 Arthropathic psoriasis, unspecified: Secondary | ICD-10-CM | POA: Diagnosis not present

## 2018-11-30 DIAGNOSIS — R69 Illness, unspecified: Secondary | ICD-10-CM | POA: Diagnosis not present

## 2018-12-14 ENCOUNTER — Encounter: Payer: Self-pay | Admitting: Internal Medicine

## 2018-12-14 DIAGNOSIS — Z6831 Body mass index (BMI) 31.0-31.9, adult: Secondary | ICD-10-CM | POA: Diagnosis not present

## 2018-12-14 DIAGNOSIS — R1013 Epigastric pain: Secondary | ICD-10-CM | POA: Diagnosis not present

## 2018-12-14 DIAGNOSIS — R109 Unspecified abdominal pain: Secondary | ICD-10-CM | POA: Diagnosis not present

## 2018-12-14 DIAGNOSIS — T50905A Adverse effect of unspecified drugs, medicaments and biological substances, initial encounter: Secondary | ICD-10-CM | POA: Diagnosis not present

## 2018-12-14 DIAGNOSIS — I10 Essential (primary) hypertension: Secondary | ICD-10-CM | POA: Diagnosis not present

## 2018-12-14 DIAGNOSIS — L4050 Arthropathic psoriasis, unspecified: Secondary | ICD-10-CM | POA: Diagnosis not present

## 2018-12-21 ENCOUNTER — Ambulatory Visit (INDEPENDENT_AMBULATORY_CARE_PROVIDER_SITE_OTHER): Payer: Medicare HMO | Admitting: Internal Medicine

## 2018-12-26 ENCOUNTER — Telehealth (INDEPENDENT_AMBULATORY_CARE_PROVIDER_SITE_OTHER): Payer: Self-pay | Admitting: *Deleted

## 2018-12-26 ENCOUNTER — Other Ambulatory Visit: Payer: Self-pay

## 2018-12-26 ENCOUNTER — Encounter (INDEPENDENT_AMBULATORY_CARE_PROVIDER_SITE_OTHER): Payer: Self-pay | Admitting: *Deleted

## 2018-12-26 ENCOUNTER — Encounter (INDEPENDENT_AMBULATORY_CARE_PROVIDER_SITE_OTHER): Payer: Self-pay | Admitting: Internal Medicine

## 2018-12-26 ENCOUNTER — Ambulatory Visit (INDEPENDENT_AMBULATORY_CARE_PROVIDER_SITE_OTHER): Payer: Medicare HMO | Admitting: Internal Medicine

## 2018-12-26 VITALS — BP 125/70 | HR 82 | Temp 98.6°F | Ht 63.0 in | Wt 175.6 lb

## 2018-12-26 DIAGNOSIS — R1010 Upper abdominal pain, unspecified: Secondary | ICD-10-CM | POA: Insufficient documentation

## 2018-12-26 DIAGNOSIS — L4050 Arthropathic psoriasis, unspecified: Secondary | ICD-10-CM | POA: Diagnosis not present

## 2018-12-26 DIAGNOSIS — Z86010 Personal history of colonic polyps: Secondary | ICD-10-CM | POA: Insufficient documentation

## 2018-12-26 MED ORDER — PEG 3350-KCL-NA BICARB-NACL 420 G PO SOLR: 4000.0000 mL | Freq: Once | ORAL | 0 refills | Status: AC

## 2018-12-26 NOTE — Telephone Encounter (Signed)
Patient needs trilyte 

## 2018-12-26 NOTE — Progress Notes (Addendum)
Subjective:    Patient ID: Bethany King, female    DOB: 21-May-1971, 48 y.o.   MRN: [id number]  HPI Referred by Dr. Sherwood Gambler for possible EGD/abdominal pain, TCS. Has been having epigastric pain. Given an Rx for Protonix 40mg  BID. Seen in 12/14/2018 in their office.Her last colonoscopy was in 2012 by Dr. Darrick Penna and she says she had polyps (tubular adenoma). EGD biopsy revealed benign gastric body and antral mucosa with associated minimal chronic inflammation and reactive/regenerative changes. Fragments of fundic gland polyp. No evidence of H. Pylori, intestinal metaplasia, dysplasia or malignancy.  She says the Protonix helps her GERD. She says she continues to have epigastric pain. She c/o bloating. She feels full "all the time:". The pain increases after she eats. Does not matter how much she eats or how little. The pain started 5 weeks ago. No NSAIDS in at least 2 months.    Review of Systems Past Medical History:  Diagnosis Date  . Allergic rhinitis   . Anxiety   . Arthritis   . Bladder pain   . Complication of anesthesia   . Costochondritis    hx  . DDD (degenerative disc disease), lumbar   . Depression   . GERD (gastroesophageal reflux disease)   . Hepatic hemangioma 2006   stable  . History of adenomatous polyp of colon   . History of colitis   . History of gastritis   . History of kidney stones   . HTN (hypertension)   . Irritable bowel syndrome (IBS)   . Medullary sponge kidney    left  . Migraine   . Nephrolithiasis    bilateral-- nonobstructive per CT  . PONV (postoperative nausea and vomiting)   . Psoriatic arthritis ([facility])   . Psoriatic arthritis ([facility])   . Spinal stenosis   . Urgency of urination     Past Surgical History:  Procedure Laterality Date  . ANTERIOR CERVICAL DECOMP/DISCECTOMY FUSION  02/26/2014   C5 - C6; fusion with plate  . CARPAL TUNNEL RELEASE Bilateral right 04-01-2010/  left 05-30-2010  . CHOLECYSTECTOMY N/A 07/14/2013   Procedure:  LAPAROSCOPIC CHOLECYSTECTOMY;  Surgeon: Dalia Heading, MD;  Location: [facility];  Service: General;  Laterality: N/A;  . COLONOSCOPY  10-13-2010  . CYSTO WITH HYDRODISTENSION N/A 04/02/2015   Procedure: CYSTOSCOPY/HYDRODISTENSION, BLADDER BIOPSY WITH FULGURATION;  Surgeon: Bjorn Pippin, MD;  Location: [facility];  Service: Urology;  Laterality: N/A;  . CYSTO WITH HYDRODISTENSION N/A 03/25/2018   Procedure: CYSTOSCOPY/HYDRODISTENSION, INSTILL MARCAIN AND PYRIDIUM;  Surgeon: Bjorn Pippin, MD;  Location: [facility];  Service: Urology;  Laterality: N/A;  . CYSTOSCOPY W/ URETERAL STENT PLACEMENT Bilateral 04/08/2016   Procedure: CYSTOSCOPY WITH BILATERAL RETROGRADE PYELOGRAM, BILATERAL URETERAL STENT PLACEMENT;  Surgeon: Malen Gauze, MD;  Location: [facility];  Service: Urology;  Laterality: Bilateral;  . CYSTOSCOPY W/ URETERAL STENT PLACEMENT Left 04/15/2016   Procedure: CYSTOSCOPY WITH RETROGRADE PYELOGRAM/URETERAL STENT PLACEMENT;  Surgeon: Malen Gauze, MD;  Location: [facility];  Service: Urology;  Laterality: Left;  . CYSTOSCOPY WITH HOLMIUM LASER LITHOTRIPSY Right 04/08/2016   Procedure: CYSTOSCOPY WITH RIGHT RENAL STONE EXTRACTION WITH HOLMIUM LASER LITHOTRIPSY;  Surgeon: Malen Gauze, MD;  Location: [facility];  Service: Urology;  Laterality: Right;  . CYSTOSCOPY WITH HOLMIUM LASER LITHOTRIPSY Left 04/15/2016   Procedure: CYSTOSCOPY WITH HOLMIUM LASER LITHOTRIPSY;  Surgeon: Malen Gauze, MD;  Location: [facility];  Service: Urology;  Laterality: Left;  . CYSTOSCOPY WITH RETROGRADE PYELOGRAM, URETEROSCOPY AND STENT PLACEMENT  Left 07/05/2017   Procedure: CYSTOSCOPY WITH RETROGRADE PYELOGRAM, URETEROSCOPY AND STENT PLACEMENT;  Surgeon: Malen Gauze, MD;  Location: [facility];  Service: Urology;  Laterality: Left;  . DIAGNOSTIC LAPAROSCOPY    . DILATION AND CURETTAGE OF UTERUS    . DILITATION & CURRETTAGE/HYSTROSCOPY WITH THERMACHOICE ABLATION N/A 09/07/2012   Procedure: DILATATION  & CURETTAGE/HYSTEROSCOPY WITH THERMACHOICE ABLATION;  Surgeon: Lazaro Arms, MD;  Location: [facility];  Service: Gynecology;  Laterality: N/A;  . ESOPHAGOGASTRODUODENOSCOPY  10/14/10  . EXTRACORPOREAL SHOCK WAVE LITHOTRIPSY  multiple  . HOLMIUM LASER APPLICATION Left 07/05/2017   Procedure: HOLMIUM LASER APPLICATION;  Surgeon: Malen Gauze, MD;  Location: [facility];  Service: Urology;  Laterality: Left;  . PERCUTANEOUS NEPHROSTOLITHOTOMY  2003  . PLANTAR FASCIA SURGERY Bilateral right 2008//  left 2010  . STONE EXTRACTION WITH BASKET Left 04/15/2016   Procedure: STONE EXTRACTION WITH BASKET;  Surgeon: Malen Gauze, MD;  Location: [facility];  Service: Urology;  Laterality: Left;    Allergies  Allergen Reactions  . Hydromorphone Itching  . Prednisone Other (See Comments)    Increase anxiety symptoms, insomnia  . Tramadol Hcl Other (See Comments)    dizziness  . Sulfa Antibiotics Rash    Current Outpatient Medications on File Prior to Visit  Medication Sig Dispense Refill  . Adalimumab (HUMIRA PEN) 40 MG/0.4ML PNKT Inject into the skin. Twice a month    . ALPRAZolam (XANAX) 1 MG tablet Take 1 mg by mouth at bedtime.     . DULoxetine (CYMBALTA) 60 MG capsule Take 60 mg by mouth daily.    Marland Kitchen HYDROcodone-acetaminophen (NORCO) 10-325 MG tablet Take 1 tablet by mouth every 6 (six) hours as needed.    . ondansetron (ZOFRAN) 8 MG tablet Take by mouth every 8 (eight) hours as needed for nausea or vomiting.    . potassium chloride SA (K-DUR,KLOR-CON) 20 MEQ tablet Take 40 mEq by mouth daily.     . Probiotic Product (ALIGN PO) Take by mouth.    . triamterene-hydrochlorothiazide (DYAZIDE) 37.5-25 MG capsule Take 1 capsule by mouth daily.   11  . calcium carbonate (TUMS - DOSED IN MG ELEMENTAL CALCIUM) 500 MG chewable tablet Chew 2-3 tablets by mouth daily as needed for indigestion or heartburn.    . cetirizine (ZYRTEC) 10 MG tablet Take 10 mg by mouth daily.    Marland Kitchen Conj Estrogens-Bazedoxifene  0.45-20 MG TABS Take 1 tablet by mouth daily. Take 1 tablet daily (Patient not taking: Reported on 03/15/2018) 30 tablet 11  . estradiol (ESTRACE) 2 MG tablet Take 1 tablet (2 mg total) by mouth daily. (Patient not taking: Reported on 12/26/2018) 30 tablet 11  . folic acid (FOLVITE) 1 MG tablet Take 2 mg by mouth daily.   3  . medroxyPROGESTERone (PROVERA) 2.5 MG tablet Take 1 tablet (2.5 mg total) by mouth daily. (Patient not taking: Reported on 12/26/2018) 30 tablet 11  . methotrexate (RHEUMATREX) 2.5 MG tablet Take 10 mg by mouth every Sunday.   3  . Multiple Vitamin (MULTIVITAMIN WITH MINERALS) TABS tablet Take 1 tablet by mouth daily.    Marland Kitchen oxybutynin (DITROPAN) 5 MG tablet Take 5 mg by mouth 2 (two) times daily as needed for bladder spasms.   6  . oxyCODONE-acetaminophen (PERCOCET) 5-325 MG tablet Take 1 tablet by mouth every 6 (six) hours as needed for severe pain. (Patient not taking: Reported on 12/26/2018) 8 tablet 0   No current facility-administered medications on file prior to visit.    '  Objective:   Physical Exam Blood pressure 125/70, pulse 82, temperature 98.6 F (37 C), height 5\' 3"  (1.6 m), weight 175 lb 9.6 oz (79.7 kg). Alert and oriented. Skin warm and dry. Oral mucosa is moist.   . Sclera anicteric, conjunctivae is pink. Thyroid not enlarged. No cervical lymphadenopathy. Lungs clear. Heart regular rate and rhythm.  Abdomen is soft. Bowel sounds are positive. No hepatomegaly. No abdominal masses felt. Epigastric tenderness.  No edema to lower extremities.         Assessment & Plan:  Hx of colonic polyp (tubular adenoma). Needs surveillance.  GERD: Will schedule an EGD to make sure she doesn't have an ulcer.  EGD/Colonoscopy.

## 2018-12-28 ENCOUNTER — Telehealth (INDEPENDENT_AMBULATORY_CARE_PROVIDER_SITE_OTHER): Payer: Self-pay | Admitting: *Deleted

## 2018-12-28 ENCOUNTER — Encounter (INDEPENDENT_AMBULATORY_CARE_PROVIDER_SITE_OTHER): Payer: Self-pay | Admitting: *Deleted

## 2018-12-28 MED ORDER — SUPREP BOWEL PREP KIT 17.5-3.13-1.6 GM/177ML PO SOLN: 1.0000 | Freq: Once | ORAL | 0 refills | Status: DC

## 2018-12-28 NOTE — Telephone Encounter (Signed)
Patient needs suprep 

## 2018-12-28 NOTE — Telephone Encounter (Signed)
suprep

## 2019-01-02 ENCOUNTER — Other Ambulatory Visit: Payer: Self-pay

## 2019-01-02 ENCOUNTER — Other Ambulatory Visit (HOSPITAL_COMMUNITY)
Admission: RE | Admit: 2019-01-02 | Discharge: 2019-01-02 | Disposition: A | Discharge status: Home / Self Care | Payer: Medicare HMO | Source: Ambulatory Visit | Attending: Internal Medicine | Admitting: Internal Medicine

## 2019-01-02 DIAGNOSIS — Z1159 Encounter for screening for other viral diseases: Secondary | ICD-10-CM | POA: Insufficient documentation

## 2019-01-03 LAB — SARS CORONAVIRUS 2 (TAT 6-24 HRS): SARS Coronavirus 2: NEGATIVE

## 2019-01-04 ENCOUNTER — Ambulatory Visit (HOSPITAL_COMMUNITY)
Admission: RE | Admit: 2019-01-04 | Discharge: 2019-01-04 | Disposition: A | Discharge status: Home / Self Care | Payer: Medicare HMO | Attending: Internal Medicine | Admitting: Internal Medicine

## 2019-01-04 ENCOUNTER — Encounter (HOSPITAL_COMMUNITY)
Admission: RE | Disposition: A | Discharge status: Home / Self Care | Payer: Self-pay | Source: Home / Self Care | Attending: Internal Medicine

## 2019-01-04 ENCOUNTER — Other Ambulatory Visit: Payer: Self-pay

## 2019-01-04 ENCOUNTER — Encounter (HOSPITAL_COMMUNITY): Payer: Self-pay | Admitting: *Deleted

## 2019-01-04 DIAGNOSIS — F329 Major depressive disorder, single episode, unspecified: Secondary | ICD-10-CM | POA: Diagnosis not present

## 2019-01-04 DIAGNOSIS — K317 Polyp of stomach and duodenum: Secondary | ICD-10-CM | POA: Diagnosis not present

## 2019-01-04 DIAGNOSIS — R1013 Epigastric pain: Secondary | ICD-10-CM

## 2019-01-04 DIAGNOSIS — K228 Other specified diseases of esophagus: Secondary | ICD-10-CM | POA: Insufficient documentation

## 2019-01-04 DIAGNOSIS — Z1211 Encounter for screening for malignant neoplasm of colon: Secondary | ICD-10-CM | POA: Insufficient documentation

## 2019-01-04 DIAGNOSIS — Z888 Allergy status to other drugs, medicaments and biological substances status: Secondary | ICD-10-CM | POA: Diagnosis not present

## 2019-01-04 DIAGNOSIS — K219 Gastro-esophageal reflux disease without esophagitis: Secondary | ICD-10-CM | POA: Diagnosis not present

## 2019-01-04 DIAGNOSIS — Z79899 Other long term (current) drug therapy: Secondary | ICD-10-CM | POA: Diagnosis not present

## 2019-01-04 DIAGNOSIS — F419 Anxiety disorder, unspecified: Secondary | ICD-10-CM | POA: Insufficient documentation

## 2019-01-04 DIAGNOSIS — Z882 Allergy status to sulfonamides status: Secondary | ICD-10-CM | POA: Diagnosis not present

## 2019-01-04 DIAGNOSIS — Z09 Encounter for follow-up examination after completed treatment for conditions other than malignant neoplasm: Secondary | ICD-10-CM | POA: Diagnosis not present

## 2019-01-04 DIAGNOSIS — K5730 Diverticulosis of large intestine without perforation or abscess without bleeding: Secondary | ICD-10-CM | POA: Diagnosis not present

## 2019-01-04 DIAGNOSIS — I10 Essential (primary) hypertension: Secondary | ICD-10-CM | POA: Insufficient documentation

## 2019-01-04 DIAGNOSIS — R1010 Upper abdominal pain, unspecified: Secondary | ICD-10-CM | POA: Insufficient documentation

## 2019-01-04 DIAGNOSIS — K648 Other hemorrhoids: Secondary | ICD-10-CM | POA: Diagnosis not present

## 2019-01-04 DIAGNOSIS — D120 Benign neoplasm of cecum: Secondary | ICD-10-CM | POA: Diagnosis not present

## 2019-01-04 DIAGNOSIS — Z86010 Personal history of colonic polyps: Secondary | ICD-10-CM | POA: Insufficient documentation

## 2019-01-04 DIAGNOSIS — R69 Illness, unspecified: Secondary | ICD-10-CM | POA: Diagnosis not present

## 2019-01-04 DIAGNOSIS — Z885 Allergy status to narcotic agent status: Secondary | ICD-10-CM | POA: Diagnosis not present

## 2019-01-04 HISTORY — PX: POLYPECTOMY: SHX5525

## 2019-01-04 HISTORY — PX: COLONOSCOPY: SHX5424

## 2019-01-04 HISTORY — PX: BIOPSY: SHX5522

## 2019-01-04 HISTORY — PX: ESOPHAGOGASTRODUODENOSCOPY: SHX5428

## 2019-01-04 LAB — HEPATIC FUNCTION PANEL
ALT: 18 U/L (ref 0–44)
AST: 21 U/L (ref 15–41)
Albumin: 3.9 g/dL (ref 3.5–5.0)
Alkaline Phosphatase: 55 U/L (ref 38–126)
Bilirubin, Direct: 0.2 mg/dL (ref 0.0–0.2)
Indirect Bilirubin: 1.9 mg/dL — ABNORMAL HIGH (ref 0.3–0.9)
Total Bilirubin: 2.1 mg/dL — ABNORMAL HIGH (ref 0.3–1.2)
Total Protein: 6.8 g/dL (ref 6.5–8.1)

## 2019-01-04 SURGERY — EGD (ESOPHAGOGASTRODUODENOSCOPY)
Anesthesia: Moderate Sedation

## 2019-01-04 MED ORDER — MEPERIDINE HCL 50 MG/ML IJ SOLN
INTRAMUSCULAR | Status: DC | PRN
  Administered 2019-01-04 (×4): 25 mg via INTRAVENOUS

## 2019-01-04 MED ORDER — MEPERIDINE HCL 50 MG/ML IJ SOLN
INTRAMUSCULAR | Status: AC
  Filled 2019-01-04: qty 1

## 2019-01-04 MED ORDER — ONDANSETRON HCL 4 MG/2ML IJ SOLN
INTRAMUSCULAR | Status: AC
  Filled 2019-01-04: qty 2

## 2019-01-04 MED ORDER — STERILE WATER FOR IRRIGATION IR SOLN
Status: DC | PRN
  Administered 2019-01-04: 2.5 mL

## 2019-01-04 MED ORDER — LIDOCAINE VISCOUS HCL 2 % MT SOLN
OROMUCOSAL | Status: AC
  Filled 2019-01-04: qty 15

## 2019-01-04 MED ORDER — ONDANSETRON HCL 4 MG/2ML IJ SOLN
INTRAMUSCULAR | Status: DC | PRN
  Administered 2019-01-04: 4 mg via INTRAVENOUS

## 2019-01-04 MED ORDER — MIDAZOLAM HCL 5 MG/5ML IJ SOLN
INTRAMUSCULAR | Status: AC
  Filled 2019-01-04: qty 5

## 2019-01-04 MED ORDER — LIDOCAINE VISCOUS HCL 2 % MT SOLN
OROMUCOSAL | Status: DC | PRN
  Administered 2019-01-04: 4 mL via OROMUCOSAL

## 2019-01-04 MED ORDER — MIDAZOLAM HCL 5 MG/5ML IJ SOLN
INTRAMUSCULAR | Status: DC | PRN
  Administered 2019-01-04 (×8): 2 mg via INTRAVENOUS

## 2019-01-04 MED ORDER — MIDAZOLAM HCL 5 MG/5ML IJ SOLN
INTRAMUSCULAR | Status: AC
  Filled 2019-01-04: qty 10

## 2019-01-04 MED ORDER — SODIUM CHLORIDE 0.9 % IV SOLN
INTRAVENOUS | Status: DC
  Administered 2019-01-04: 1000 mL via INTRAVENOUS

## 2019-01-04 NOTE — Op Note (Signed)
[facility] Patient Name: Bethany King Procedure Date: 01/04/2019 9:20 AM MRN: [id number] Date of Birth: [birth year] Attending MD: Lionel December , MD CSN: [id number] Age: 48 Admit Type: Outpatient Procedure:                Upper GI endoscopy Indications:              Epigastric abdominal pain Providers:                Lionel December, MD, Loma Messing B. Patsy Lager, RN, Dyann Ruddle Referring MD:             Elfredia Nevins, MD Medicines:                Lidocaine spray, Meperidine 100 mg IV, Midazolam 14                            mg IV Complications:            No immediate complications. Estimated Blood Loss:     Estimated blood loss was minimal. Procedure:                Pre-Anesthesia Assessment:                           - Prior to the procedure, a History and Physical                            was performed, and patient medications and                            allergies were reviewed. The patient's tolerance of                            previous anesthesia was also reviewed. The risks                            and benefits of the procedure and the sedation                            options and risks were discussed with the patient.                            All questions were answered, and informed consent                            was obtained. Prior Anticoagulants: The patient has                            taken no previous anticoagulant or antiplatelet                            agents. ASA Grade Assessment: III - A patient with  severe systemic disease. After reviewing the risks                            and benefits, the patient was deemed in                            satisfactory condition to undergo the procedure.                           After obtaining informed consent, the endoscope was                            passed under direct vision. Throughout the                            procedure, the patient's  blood pressure, pulse, and                            oxygen saturations were monitored continuously. The                            GIF-H190 ([id number]) scope was introduced through the                            mouth, and advanced to the second part of duodenum.                            The upper GI endoscopy was accomplished without                            difficulty. The patient tolerated the procedure                            well. Scope In: 9:59:45 AM Scope Out: 10:07:19 AM Total Procedure Duration: 0 hours 7 minutes 34 seconds  Findings:      The examined esophagus was normal.      The Z-line was irregular and was found 38 cm from the incisors.      A few small sessile polyps with no stigmata of recent bleeding were       found in the gastric fundus and in the gastric body. Biopsies were taken       with a cold forceps for histology. The pathology specimen was placed       into Bottle Number 1.      No other significant abnormalities were identified in a careful       examination of the stomach.      The duodenal bulb and second portion of the duodenum were normal. Impression:               - Normal esophagus.                           - Z-line irregular, 38 cm from the incisors.                           -  A few gastric polyps. Biopsied.                           - Normal duodenal bulb and second portion of the                            duodenum. Moderate Sedation:      Moderate (conscious) sedation was administered by the endoscopy nurse       and supervised by the endoscopist. The following parameters were       monitored: oxygen saturation, heart rate, blood pressure, CO2       capnography and response to care. Total physician intraservice time was       20 minutes. Recommendation:           - Patient has a contact number available for                            emergencies. The signs and symptoms of potential                            delayed complications were  discussed with the                            patient. Return to normal activities tomorrow.                            Written discharge instructions were provided to the                            patient.                           - Resume previous diet today.                           - Decrease Pantoprazole to once a day but continue                            present medications.                           - LFTs and serum lipase will be checked today.                           - No aspirin, ibuprofen, naproxen, or other                            non-steroidal anti-inflammatory drugs for 1 day. Procedure Code(s):        --- Professional ---                           [phone], Esophagogastroduodenoscopy, flexible,                            transoral; with biopsy, single or multiple  G0500, Moderate sedation services provided by the                            same physician or other qualified health care                            professional performing a gastrointestinal                            endoscopic service that sedation supports,                            requiring the presence of an independent trained                            observer to assist in the monitoring of the                            patient's level of consciousness and physiological                            status; initial 15 minutes of intra-service time;                            patient age 4 years or older (additional time may                            be reported with 82956, as appropriate) Diagnosis Code(s):        --- Professional ---                           K22.8, Other specified diseases of esophagus                           K31.7, Polyp of stomach and duodenum                           R10.13, Epigastric pain CPT copyright 2019 American [employer]. All rights reserved. The codes documented in this report are preliminary and upon coder review may  be  revised to meet current compliance requirements. Lionel December, MD Lionel December, MD 01/04/2019 10:45:02 AM This report has been signed electronically. Number of Addenda: 0

## 2019-01-04 NOTE — Op Note (Addendum)
[facility] Patient Name: Bethany King Procedure Date: 01/04/2019 9:14 AM MRN: [id number] Date of Birth: 06-Feb-1971 Attending MD: Lionel December , MD CSN: [id number] Age: 48 Admit Type: Outpatient Procedure:                Colonoscopy Indications:              High risk colon cancer surveillance: Personal                            history of colonic polyps Providers:                Lionel December, MD, Loma Messing B. Patsy Lager, RN, Dyann Ruddle Referring MD:             Elfredia Nevins, MD Medicines:                Midazolam 2 mg IV Complications:            No immediate complications. Estimated Blood Loss:     Estimated blood loss was minimal. Procedure:                Pre-Anesthesia Assessment:                           - Prior to the procedure, a History and Physical                            was performed, and patient medications and                            allergies were reviewed. The patient's tolerance of                            previous anesthesia was also reviewed. The risks                            and benefits of the procedure and the sedation                            options and risks were discussed with the patient.                            All questions were answered, and informed consent                            was obtained. Prior Anticoagulants: The patient has                            taken no previous anticoagulant or antiplatelet                            agents. ASA Grade Assessment: III - A patient with  severe systemic disease. After reviewing the risks                            and benefits, the patient was deemed in                            satisfactory condition to undergo the procedure.                           After obtaining informed consent, the colonoscope                            was passed under direct vision. Throughout the                            procedure, the patient's  blood pressure, pulse, and                            oxygen saturations were monitored continuously. The                            [id number]-H190DL ([id number]) scope was introduced through                            the anus and advanced to the the cecum, identified                            by appendiceal orifice and ileocecal valve. The                            colonoscopy was performed without difficulty. The                            patient tolerated the procedure well. The quality                            of the bowel preparation was excellent. The                            ileocecal valve, appendiceal orifice, and rectum                            were photographed. Scope In: 10:10:56 AM Scope Out: 10:29:30 AM Scope Withdrawal Time: 0 hours 9 minutes 2 seconds  Total Procedure Duration: 0 hours 18 minutes 34 seconds  Findings:      The perianal and digital rectal examinations were normal.      Two sessile polyps were found in the cecum. The polyps were small in       size. The pathology specimen was placed into Bottle Number 2.      A single small-mouthed diverticulum was found in the hepatic flexure.      The exam was otherwise normal throughout the examined colon.      Internal hemorrhoids were found during retroflexion. The hemorrhoids       were small.  Impression:               - Two small polyps in the cecum.                           - Diverticulosis at the hepatic flexure.                           - Internal hemorrhoids.                           - No specimens collected. Moderate Sedation:      Moderate (conscious) sedation was administered by the endoscopy nurse       and supervised by the endoscopist. The following parameters were       monitored: oxygen saturation, heart rate, blood pressure, CO2       capnography and response to care. Total physician intraservice time was       22 minutes. Recommendation:           - Patient has a contact number available for                             emergencies. The signs and symptoms of potential                            delayed complications were discussed with the                            patient. Return to normal activities tomorrow.                            Written discharge instructions were provided to the                            patient.                           - Resume previous diet today.                           - Continue present medications.                           - Await pathology results.                           - Repeat colonoscopy for surveillance based on                            pathology results. Procedure Code(s):        --- Professional ---                           [phone], Colonoscopy, flexible; diagnostic, including                            collection of specimen(s) by brushing or washing,  when performed (separate procedure)                           G0500, Moderate sedation services provided by the                            same physician or other qualified health care                            professional performing a gastrointestinal                            endoscopic service that sedation supports,                            requiring the presence of an independent trained                            observer to assist in the monitoring of the                            patient's level of consciousness and physiological                            status; initial 15 minutes of intra-service time;                            patient age 15 years or older (additional time may                            be reported with 40981, as appropriate) Diagnosis Code(s):        --- Professional ---                           Z86.010, Personal history of colonic polyps                           K63.5, Polyp of colon                           K64.8, Other hemorrhoids                           K57.30, Diverticulosis of large intestine without                             perforation or abscess without bleeding CPT copyright 2019 American Medical Association. All rights reserved. The codes documented in this report are preliminary and upon coder review may  be revised to meet current compliance requirements. Lionel December, MD Lionel December, MD 01/04/2019 10:49:50 AM This report has been signed electronically. Number of Addenda: 1 Addendum Number: 1   Addendum Date: 01/12/2019 7:41:51 AM      Specimen was collected.      Cecal polyps. Lionel December, MD Lionel December, MD 01/12/2019 7:42:29 AM This report has been signed electronically.

## 2019-01-04 NOTE — Discharge Instructions (Signed)
No aspirin or NSAIDs for 24 hours. Decrease pantoprazole 40 mg once a day.  Take it 30 minutes before breakfast daily. Continue other medications as before. Resume usual diet. No driving for 24 hours. Physician will call with results of blood test and biopsy and further recommendations.       Upper Endoscopy, Adult, Care After This sheet gives you information about how to care for yourself after your procedure. Your health care provider may also give you more specific instructions. If you have problems or questions, contact your health care provider. What can I expect after the procedure? After the procedure, it is common to have:  A sore throat.  Mild stomach pain or discomfort.  Bloating.  Nausea. Follow these instructions at home:   Follow instructions from your health care provider about what to eat or drink after your procedure.  Return to your normal activities as told by your health care provider. Ask your health care provider what activities are safe for you.  Take over-the-counter and prescription medicines only as told by your health care provider.  Do not drive for 24 hours if you were given a sedative during your procedure.  Keep all follow-up visits as told by your health care provider. This is important. Contact a health care provider if you have:  A sore throat that lasts longer than one day.  Trouble swallowing. Get help right away if:  You vomit blood or your vomit looks like coffee grounds.  You have: ? A fever. ? Bloody, black, or tarry stools. ? A severe sore throat or you cannot swallow. ? Difficulty breathing. ? Severe pain in your chest or abdomen. Summary  After the procedure, it is common to have a sore throat, mild stomach discomfort, bloating, and nausea.  Do not drive for 24 hours if you were given a sedative during the procedure.  Follow instructions from your health care provider about what to eat or drink after your  procedure.  Return to your normal activities as told by your health care provider. This information is not intended to replace advice given to you by your health care provider. Make sure you discuss any questions you have with your health care provider. Document Released: 12/01/2011 Document Revised: 11/23/2017 Document Reviewed: 11/01/2017 Elsevier Patient Education  2020 [employer].     Colonoscopy, Adult, Care After This sheet gives you information about how to care for yourself after your procedure. Your doctor may also give you more specific instructions. If you have problems or questions, call your doctor. What can I expect after the procedure? After the procedure, it is common to have:  A small amount of blood in your poop for 24 hours.  Some gas.  Mild cramping or bloating in your belly. Follow these instructions at home: General instructions  For the first 24 hours after the procedure: ? Do not drive or use machinery. ? Do not sign important documents. ? Do not drink alcohol. ? Do your daily activities more slowly than normal. ? Eat foods that are soft and easy to digest.  Take over-the-counter or prescription medicines only as told by your doctor. To help cramping and bloating:   Try walking around.  Put heat on your belly (abdomen) as told by your doctor. Use a heat source that your doctor recommends, such as a moist heat pack or a heating pad. ? Put a towel between your skin and the heat source. ? Leave the heat on for 20-30 minutes. ? Remove  the heat if your skin turns bright red. This is especially important if you cannot feel pain, heat, or cold. You can get burned. Eating and drinking   Drink enough fluid to keep your pee (urine) clear or pale yellow.  Return to your normal diet as told by your doctor. Avoid heavy or fried foods that are hard to digest.  Avoid drinking alcohol for as long as told by your doctor. Contact a doctor if:  You have blood  in your poop (stool) 2-3 days after the procedure. Get help right away if:  You have more than a small amount of blood in your poop.  You see large clumps of tissue (blood clots) in your poop.  Your belly is swollen.  You feel sick to your stomach (nauseous).  You throw up (vomit).  You have a fever.  You have belly pain that gets worse, and medicine does not help your pain. Summary  After the procedure, it is common to have a small amount of blood in your poop. You may also have mild cramping and bloating in your belly.  For the first 24 hours after the procedure, do not drive or use machinery, do not sign important documents, and do not drink alcohol.  Get help right away if you have a lot of blood in your poop, feel sick to your stomach, have a fever, or have more belly pain. This information is not intended to replace advice given to you by your health care provider. Make sure you discuss any questions you have with your health care provider. Document Released: 07/04/2010 Document Revised: 04/01/2017 Document Reviewed: 02/24/2016 Elsevier Patient Education  2020 [employer].     Colon Polyps  Polyps are tissue growths inside the body. Polyps can grow in many places, including the large intestine (colon). A polyp may be a round bump or a mushroom-shaped growth. You could have one polyp or several. Most colon polyps are noncancerous (benign). However, some colon polyps can become cancerous over time. Finding and removing the polyps early can help prevent this. What are the causes? The exact cause of colon polyps is not known. What increases the risk? You are more likely to develop this condition if you:  Have a family history of colon cancer or colon polyps.  Are older than 77 or older than 45 if you are African American.  Have inflammatory bowel disease, such as ulcerative colitis or Crohn's disease.  Have certain hereditary conditions, such as: ? Familial  adenomatous polyposis. ? Lynch syndrome. ? Turcot syndrome. ? Peutz-Jeghers syndrome.  Are overweight.  Smoke cigarettes.  Do not get enough exercise.  Drink too much alcohol.  Eat a diet that is high in fat and red meat and low in fiber.  Had childhood cancer that was treated with abdominal radiation. What are the signs or symptoms? Most polyps do not cause symptoms. If you have symptoms, they may include:  Blood coming from your rectum when having a bowel movement.  Blood in your stool. The stool may look dark red or black.  Abdominal pain.  A change in bowel habits, such as constipation or diarrhea. How is this diagnosed? This condition is diagnosed with a colonoscopy. This is a procedure in which a lighted, flexible scope is inserted into the anus and then passed into the colon to examine the area. Polyps are sometimes found when a colonoscopy is done as part of routine cancer screening tests. How is this treated? Treatment for this condition  involves removing any polyps that are found. Most polyps can be removed during a colonoscopy. Those polyps will then be tested for cancer. Additional treatment may be needed depending on the results of testing. Follow these instructions at home: Lifestyle  Maintain a healthy weight, or lose weight if recommended by your health care provider.  Exercise every day or as told by your health care provider.  Do not use any products that contain nicotine or tobacco, such as cigarettes and e-cigarettes. If you need help quitting, ask your health care provider.  If you drink alcohol, limit how much you have: ? 0-1 drink a day for women. ? 0-2 drinks a day for men.  Be aware of how much alcohol is in your drink. In the [place] one drink equals one 12 oz bottle of beer (355 mL), one 5 oz glass of wine (148 mL), or one 1 oz shot of hard liquor (44 mL). Eating and drinking   Eat foods that are high in fiber, such as fruits, vegetables, and  whole grains.  Eat foods that are high in calcium and vitamin D, such as milk, cheese, yogurt, eggs, liver, fish, and broccoli.  Limit foods that are high in fat, such as fried foods and desserts.  Limit the amount of red meat and processed meat you eat, such as hot dogs, sausage, bacon, and lunch meats. General instructions  Keep all follow-up visits as told by your health care provider. This is important. ? This includes having regularly scheduled colonoscopies. ? Talk to your health care provider about when you need a colonoscopy. Contact a health care provider if:  You have new or worsening bleeding during a bowel movement.  You have new or increased blood in your stool.  You have a change in bowel habits.  You lose weight for no known reason. Summary  Polyps are tissue growths inside the body. Polyps can grow in many places, including the colon.  Most colon polyps are noncancerous (benign), but some can become cancerous over time.  This condition is diagnosed with a colonoscopy.  Treatment for this condition involves removing any polyps that are found. Most polyps can be removed during a colonoscopy. This information is not intended to replace advice given to you by your health care provider. Make sure you discuss any questions you have with your health care provider. Document Released: 02/26/2004 Document Revised: 09/16/2017 Document Reviewed: 09/16/2017 Elsevier Patient Education  2020 [employer].

## 2019-01-04 NOTE — H&P (Signed)
Bethany King is an 48 y.o. female.   Chief Complaint: Patient is here for EGD and colonoscopy. HPI: Patient is a 48 year old Caucasian female who presents with 29-month history of epigastric pain.  She does the best when she is not eating.  Pain starts within 20 to 30 minutes after each meal and may last for several hours.  She has had nausea but no vomiting.  She does not experience heartburn.  She denies melena or rectal bleeding.  Her bowels are regular.  She has not lost any weight.  She does not take OTC NSAIDs.  She has been on pantoprazole twice daily for 3 weeks but not feeling any better.  She is status post cholecystectomy few years ago.  She was on methotrexate for psoriasis which was stopped 2 months ago.  Patient has not gotten better.  She is not on Humira which she has been taking for about 4 months. She does not smoke cigarettes or drink alcohol. She also has history of colonic adenoma which was removed in December 2012.  She is advised to come back for follow-up exam in 7 years. Father died of melanoma at age 61. Mother had 4 different types of autoimmune diseases and she died at 12  Past Medical History:  Diagnosis Date  . Allergic rhinitis   . Anxiety   . Arthritis   . Bladder pain   . Complication of anesthesia   . Costochondritis    hx  . DDD (degenerative disc disease), lumbar   . Depression   . GERD (gastroesophageal reflux disease)   . Hepatic hemangioma 2006   stable  . History of adenomatous polyp of colon   . History of colitis   . History of gastritis   . History of kidney stones   . HTN (hypertension)   . Irritable bowel syndrome (IBS)   . Medullary sponge kidney    left  . Migraine   . Nephrolithiasis    bilateral-- nonobstructive per CT  . PONV (postoperative nausea and vomiting)   . Psoriatic arthritis ([place])   . Psoriatic arthritis ([place])   . Spinal stenosis   . Urgency of urination     Past Surgical History:  Procedure Laterality Date  .  ANTERIOR CERVICAL DECOMP/DISCECTOMY FUSION  02/26/2014   C5 - C6; fusion with plate  . CARPAL TUNNEL RELEASE Bilateral right 04-01-2010/  left 05-30-2010  . CHOLECYSTECTOMY N/A 07/14/2013   Procedure: LAPAROSCOPIC CHOLECYSTECTOMY;  Surgeon: Jamesetta So, MD;  Location: [facility];  Service: General;  Laterality: N/A;  . COLONOSCOPY  10-13-2010  . CYSTO WITH HYDRODISTENSION N/A 04/02/2015   Procedure: CYSTOSCOPY/HYDRODISTENSION, BLADDER BIOPSY WITH FULGURATION;  Surgeon: Irine Seal, MD;  Location: [facility];  Service: Urology;  Laterality: N/A;  . CYSTO WITH HYDRODISTENSION N/A 03/25/2018   Procedure: CYSTOSCOPY/HYDRODISTENSION, INSTILL MARCAIN AND PYRIDIUM;  Surgeon: Irine Seal, MD;  Location: [facility];  Service: Urology;  Laterality: N/A;  . CYSTOSCOPY W/ URETERAL STENT PLACEMENT Bilateral 04/08/2016   Procedure: CYSTOSCOPY WITH BILATERAL RETROGRADE PYELOGRAM, BILATERAL URETERAL STENT PLACEMENT;  Surgeon: Cleon Gustin, MD;  Location: [facility];  Service: Urology;  Laterality: Bilateral;  . CYSTOSCOPY W/ URETERAL STENT PLACEMENT Left 04/15/2016   Procedure: CYSTOSCOPY WITH RETROGRADE PYELOGRAM/URETERAL STENT PLACEMENT;  Surgeon: Cleon Gustin, MD;  Location: [facility];  Service: Urology;  Laterality: Left;  . CYSTOSCOPY WITH HOLMIUM LASER LITHOTRIPSY Right 04/08/2016   Procedure: CYSTOSCOPY WITH RIGHT RENAL STONE EXTRACTION WITH HOLMIUM LASER LITHOTRIPSY;  Surgeon: Saralyn Pilar  Alita Chyle, MD;  Location: [facility];  Service: Urology;  Laterality: Right;  . CYSTOSCOPY WITH HOLMIUM LASER LITHOTRIPSY Left 04/15/2016   Procedure: CYSTOSCOPY WITH HOLMIUM LASER LITHOTRIPSY;  Surgeon: Cleon Gustin, MD;  Location: [facility];  Service: Urology;  Laterality: Left;  . CYSTOSCOPY WITH RETROGRADE PYELOGRAM, URETEROSCOPY AND STENT PLACEMENT Left 07/05/2017   Procedure: CYSTOSCOPY WITH RETROGRADE PYELOGRAM, URETEROSCOPY AND STENT PLACEMENT;  Surgeon: Cleon Gustin, MD;  Location: [facility];   Service: Urology;  Laterality: Left;  . DIAGNOSTIC LAPAROSCOPY    . DILATION AND CURETTAGE OF UTERUS    . DILITATION & CURRETTAGE/HYSTROSCOPY WITH THERMACHOICE ABLATION N/A 09/07/2012   Procedure: DILATATION & CURETTAGE/HYSTEROSCOPY WITH THERMACHOICE ABLATION;  Surgeon: Florian Buff, MD;  Location: [facility];  Service: Gynecology;  Laterality: N/A;  . ESOPHAGOGASTRODUODENOSCOPY  10/14/10  . EXTRACORPOREAL SHOCK WAVE LITHOTRIPSY  multiple  . HOLMIUM LASER APPLICATION Left 09/15/4740   Procedure: HOLMIUM LASER APPLICATION;  Surgeon: Cleon Gustin, MD;  Location: [facility];  Service: Urology;  Laterality: Left;  . PERCUTANEOUS NEPHROSTOLITHOTOMY  2003  . PLANTAR FASCIA SURGERY Bilateral right 2008//  left 2010  . STONE EXTRACTION WITH BASKET Left 04/15/2016   Procedure: STONE EXTRACTION WITH BASKET;  Surgeon: Cleon Gustin, MD;  Location: [facility];  Service: Urology;  Laterality: Left;    Family History  Problem Relation Age of Onset  . Irritable bowel syndrome Mother   . Scleroderma Mother   . Rheum arthritis Mother   . Asthma Mother   . Melanoma Father   . Cancer Father        Melanoma  . Colon cancer Cousin 43       second cousin Sunday [place])  . Cancer Maternal Grandmother        Ovarian  . Diabetes Maternal Grandmother   . Thyroid disease Maternal Aunt    Social History:  reports that she has never smoked. She has never used smokeless tobacco. She reports that she does not drink alcohol or use drugs.  Allergies:  Allergies  Allergen Reactions  . Hydromorphone Itching  . Prednisone Other (See Comments)    Increase anxiety symptoms, insomnia  . Tramadol Hcl Other (See Comments)    dizziness  . Sulfa Antibiotics Rash    Medications Prior to Admission  Medication Sig Dispense Refill  . Adalimumab (HUMIRA PEN) 40 MG/0.4ML PNKT Inject 40 mg into the skin every 14 (fourteen) days. Twice a month     . ALPRAZolam (XANAX) 1 MG tablet Take 1 mg by mouth 2 (two) times daily as  needed for anxiety.     . calcium carbonate (TUMS - DOSED IN MG ELEMENTAL CALCIUM) 500 MG chewable tablet Chew 2-3 tablets by mouth daily as needed for indigestion or heartburn.    . cetirizine (ZYRTEC) 10 MG tablet Take 10 mg by mouth daily.    . DULoxetine (CYMBALTA) 60 MG capsule Take 60 mg by mouth daily.    Marland Kitchen HYDROcodone-acetaminophen (NORCO) 10-325 MG tablet Take 1 tablet by mouth every 6 (six) hours as needed for severe pain.     Marland Kitchen ketotifen (ZADITOR) 0.025 % ophthalmic solution Place 1 drop into both eyes 2 (two) times daily as needed (eye allergies).    . Multiple Vitamin (MULTIVITAMIN WITH MINERALS) TABS tablet Take 1 tablet by mouth daily.    . ondansetron (ZOFRAN) 8 MG tablet Take by mouth every 8 (eight) hours as needed for nausea or vomiting.    . pantoprazole (PROTONIX) 40 MG tablet  Take 40 mg by mouth 2 (two) times daily.    . potassium chloride SA (K-DUR,KLOR-CON) 20 MEQ tablet Take 40 mEq by mouth daily.     . Probiotic Product (ALIGN PO) Take 1 capsule by mouth daily.     Marland Kitchen triamterene-hydrochlorothiazide (DYAZIDE) 37.5-25 MG capsule Take 1 capsule by mouth daily.   11    No results found for this or any previous visit (from the past 48 hour(s)). No results found.  ROS  Blood pressure (!) 128/91, pulse 72, temperature 98.1 F (36.7 C), temperature source Oral, resp. rate 13, height 5\' 3"  (1.6 m), weight 77.1 kg, SpO2 100 %. Physical Exam  Constitutional: She appears well-developed and well-nourished.  HENT:  Mouth/Throat: Oropharynx is clear and moist.  Eyes: Conjunctivae are normal. No scleral icterus.  Neck: No thyromegaly present.  Scar cross anterior neck.  Cardiovascular: Normal rate, regular rhythm and normal heart sounds.  No murmur heard. Respiratory: Effort normal and breath sounds normal.  GI:  Abdomen is full.  Soft with mild midepigastric tenderness.  No organomegaly or masses.  Musculoskeletal:        General: No edema.  Lymphadenopathy:    She has  no cervical adenopathy.  Neurological: She is alert.  Skin: Skin is warm and dry.     Assessment/Plan  Postprandial epigastric pain unresponsive to PPI. History of colonic adenoma. [facility] EGD and surveillance colonoscopy.  Hildred Laser, MD 01/04/2019, 9:38 AM

## 2019-01-09 ENCOUNTER — Other Ambulatory Visit (INDEPENDENT_AMBULATORY_CARE_PROVIDER_SITE_OTHER): Payer: Self-pay | Admitting: *Deleted

## 2019-01-09 DIAGNOSIS — R109 Unspecified abdominal pain: Secondary | ICD-10-CM

## 2019-01-10 DIAGNOSIS — L4050 Arthropathic psoriasis, unspecified: Secondary | ICD-10-CM | POA: Diagnosis not present

## 2019-01-10 DIAGNOSIS — Z79899 Other long term (current) drug therapy: Secondary | ICD-10-CM | POA: Diagnosis not present

## 2019-01-10 DIAGNOSIS — M79643 Pain in unspecified hand: Secondary | ICD-10-CM | POA: Diagnosis not present

## 2019-01-10 DIAGNOSIS — M25579 Pain in unspecified ankle and joints of unspecified foot: Secondary | ICD-10-CM | POA: Diagnosis not present

## 2019-01-10 DIAGNOSIS — M1990 Unspecified osteoarthritis, unspecified site: Secondary | ICD-10-CM | POA: Diagnosis not present

## 2019-01-10 DIAGNOSIS — L409 Psoriasis, unspecified: Secondary | ICD-10-CM | POA: Diagnosis not present

## 2019-01-10 DIAGNOSIS — R748 Abnormal levels of other serum enzymes: Secondary | ICD-10-CM | POA: Diagnosis not present

## 2019-01-11 ENCOUNTER — Encounter (HOSPITAL_COMMUNITY): Payer: Self-pay | Admitting: Internal Medicine

## 2019-01-13 DIAGNOSIS — R69 Illness, unspecified: Secondary | ICD-10-CM | POA: Diagnosis not present

## 2019-01-17 ENCOUNTER — Other Ambulatory Visit (INDEPENDENT_AMBULATORY_CARE_PROVIDER_SITE_OTHER): Payer: Self-pay | Admitting: Internal Medicine

## 2019-01-17 DIAGNOSIS — R109 Unspecified abdominal pain: Secondary | ICD-10-CM

## 2019-01-23 ENCOUNTER — Ambulatory Visit (HOSPITAL_COMMUNITY)
Admission: RE | Admit: 2019-01-23 | Discharge: 2019-01-23 | Disposition: A | Discharge status: Home / Self Care | Payer: Medicare HMO | Source: Ambulatory Visit | Attending: Internal Medicine | Admitting: Internal Medicine

## 2019-01-23 ENCOUNTER — Other Ambulatory Visit: Payer: Self-pay

## 2019-01-23 DIAGNOSIS — R109 Unspecified abdominal pain: Secondary | ICD-10-CM | POA: Diagnosis not present

## 2019-01-23 DIAGNOSIS — D1809 Hemangioma of other sites: Secondary | ICD-10-CM | POA: Diagnosis not present

## 2019-01-24 ENCOUNTER — Other Ambulatory Visit (HOSPITAL_COMMUNITY): Payer: Medicare HMO

## 2019-01-24 DIAGNOSIS — Z681 Body mass index (BMI) 19 or less, adult: Secondary | ICD-10-CM | POA: Diagnosis not present

## 2019-01-24 DIAGNOSIS — J329 Chronic sinusitis, unspecified: Secondary | ICD-10-CM | POA: Diagnosis not present

## 2019-01-24 DIAGNOSIS — I10 Essential (primary) hypertension: Secondary | ICD-10-CM | POA: Diagnosis not present

## 2019-01-24 DIAGNOSIS — N3090 Cystitis, unspecified without hematuria: Secondary | ICD-10-CM | POA: Diagnosis not present

## 2019-01-24 DIAGNOSIS — R509 Fever, unspecified: Secondary | ICD-10-CM | POA: Diagnosis not present

## 2019-01-31 ENCOUNTER — Other Ambulatory Visit (INDEPENDENT_AMBULATORY_CARE_PROVIDER_SITE_OTHER): Payer: Self-pay | Admitting: *Deleted

## 2019-01-31 DIAGNOSIS — R109 Unspecified abdominal pain: Secondary | ICD-10-CM

## 2019-01-31 MED ORDER — DICYCLOMINE HCL 10 MG PO CAPS: 10.0000 mg | ORAL_CAPSULE | Freq: Three times a day (TID) | ORAL | 1 refills | Status: DC | PRN

## 2019-03-13 ENCOUNTER — Other Ambulatory Visit (HOSPITAL_COMMUNITY): Payer: Self-pay | Admitting: Physician Assistant

## 2019-03-13 DIAGNOSIS — E6609 Other obesity due to excess calories: Secondary | ICD-10-CM | POA: Diagnosis not present

## 2019-03-13 DIAGNOSIS — N6320 Unspecified lump in the left breast, unspecified quadrant: Secondary | ICD-10-CM | POA: Diagnosis not present

## 2019-03-13 DIAGNOSIS — N63 Unspecified lump in unspecified breast: Secondary | ICD-10-CM

## 2019-03-13 DIAGNOSIS — Z1231 Encounter for screening mammogram for malignant neoplasm of breast: Secondary | ICD-10-CM

## 2019-03-13 DIAGNOSIS — Z6830 Body mass index (BMI) 30.0-30.9, adult: Secondary | ICD-10-CM | POA: Diagnosis not present

## 2019-03-16 DIAGNOSIS — R69 Illness, unspecified: Secondary | ICD-10-CM | POA: Diagnosis not present

## 2019-03-22 ENCOUNTER — Ambulatory Visit (HOSPITAL_COMMUNITY)
Admission: RE | Admit: 2019-03-22 | Discharge: 2019-03-22 | Disposition: A | Discharge status: Home / Self Care | Payer: Medicare HMO | Source: Ambulatory Visit | Attending: Physician Assistant | Admitting: Physician Assistant

## 2019-03-22 ENCOUNTER — Ambulatory Visit (HOSPITAL_COMMUNITY): Payer: Medicare HMO

## 2019-03-22 ENCOUNTER — Other Ambulatory Visit: Payer: Self-pay

## 2019-03-22 DIAGNOSIS — N6489 Other specified disorders of breast: Secondary | ICD-10-CM | POA: Diagnosis not present

## 2019-03-22 DIAGNOSIS — N6322 Unspecified lump in the left breast, upper inner quadrant: Secondary | ICD-10-CM | POA: Diagnosis not present

## 2019-03-22 DIAGNOSIS — N63 Unspecified lump in unspecified breast: Secondary | ICD-10-CM | POA: Diagnosis present

## 2019-03-22 DIAGNOSIS — R928 Other abnormal and inconclusive findings on diagnostic imaging of breast: Secondary | ICD-10-CM | POA: Diagnosis not present

## 2019-03-22 DIAGNOSIS — Z1231 Encounter for screening mammogram for malignant neoplasm of breast: Secondary | ICD-10-CM

## 2019-03-28 DIAGNOSIS — Z1389 Encounter for screening for other disorder: Secondary | ICD-10-CM | POA: Diagnosis not present

## 2019-03-28 DIAGNOSIS — Z6830 Body mass index (BMI) 30.0-30.9, adult: Secondary | ICD-10-CM | POA: Diagnosis not present

## 2019-03-28 DIAGNOSIS — Z0001 Encounter for general adult medical examination with abnormal findings: Secondary | ICD-10-CM | POA: Diagnosis not present

## 2019-03-28 DIAGNOSIS — G894 Chronic pain syndrome: Secondary | ICD-10-CM | POA: Diagnosis not present

## 2019-03-28 DIAGNOSIS — G4700 Insomnia, unspecified: Secondary | ICD-10-CM | POA: Diagnosis not present

## 2019-03-28 DIAGNOSIS — I10 Essential (primary) hypertension: Secondary | ICD-10-CM | POA: Diagnosis not present

## 2019-03-29 DIAGNOSIS — Z6830 Body mass index (BMI) 30.0-30.9, adult: Secondary | ICD-10-CM | POA: Diagnosis not present

## 2019-03-29 DIAGNOSIS — E6609 Other obesity due to excess calories: Secondary | ICD-10-CM | POA: Diagnosis not present

## 2019-03-29 DIAGNOSIS — Z1389 Encounter for screening for other disorder: Secondary | ICD-10-CM | POA: Diagnosis not present

## 2019-03-29 DIAGNOSIS — Z0001 Encounter for general adult medical examination with abnormal findings: Secondary | ICD-10-CM | POA: Diagnosis not present

## 2019-04-04 ENCOUNTER — Other Ambulatory Visit (HOSPITAL_COMMUNITY)
Admission: RE | Admit: 2019-04-04 | Discharge: 2019-04-04 | Disposition: A | Discharge status: Home / Self Care | Payer: Medicare HMO | Source: Ambulatory Visit | Attending: Adult Health | Admitting: Adult Health

## 2019-04-04 ENCOUNTER — Ambulatory Visit (INDEPENDENT_AMBULATORY_CARE_PROVIDER_SITE_OTHER): Payer: Medicare HMO | Admitting: Adult Health

## 2019-04-04 ENCOUNTER — Encounter: Payer: Self-pay | Admitting: Adult Health

## 2019-04-04 ENCOUNTER — Other Ambulatory Visit: Payer: Self-pay

## 2019-04-04 VITALS — BP 133/80 | HR 72 | Ht 62.0 in | Wt 172.0 lb

## 2019-04-04 DIAGNOSIS — Z1151 Encounter for screening for human papillomavirus (HPV): Secondary | ICD-10-CM | POA: Insufficient documentation

## 2019-04-04 DIAGNOSIS — R232 Flushing: Secondary | ICD-10-CM | POA: Diagnosis not present

## 2019-04-04 DIAGNOSIS — Z1212 Encounter for screening for malignant neoplasm of rectum: Secondary | ICD-10-CM

## 2019-04-04 DIAGNOSIS — Z124 Encounter for screening for malignant neoplasm of cervix: Secondary | ICD-10-CM | POA: Insufficient documentation

## 2019-04-04 DIAGNOSIS — N951 Menopausal and female climacteric states: Secondary | ICD-10-CM

## 2019-04-04 DIAGNOSIS — Z780 Asymptomatic menopausal state: Secondary | ICD-10-CM | POA: Diagnosis not present

## 2019-04-04 DIAGNOSIS — R69 Illness, unspecified: Secondary | ICD-10-CM | POA: Diagnosis not present

## 2019-04-04 DIAGNOSIS — Z01419 Encounter for gynecological examination (general) (routine) without abnormal findings: Secondary | ICD-10-CM | POA: Diagnosis not present

## 2019-04-04 DIAGNOSIS — Z1211 Encounter for screening for malignant neoplasm of colon: Secondary | ICD-10-CM | POA: Insufficient documentation

## 2019-04-04 LAB — HEMOCCULT GUIAC POC 1CARD (OFFICE): Fecal Occult Blood, POC: NEGATIVE

## 2019-04-04 MED ORDER — PREMARIN 0.625 MG/GM VA CREA: TOPICAL_CREAM | VAGINAL | 1 refills | Status: DC

## 2019-04-04 NOTE — Progress Notes (Signed)
Patient ID: Bethany King, female   DOB: Feb 26, 1971, 48 y.o.   MRN: [id number] History of Present Illness: Bethany King is a 48 year old white female,divorced, G0P0, in for a well woman gyn exam and pap.Having hot flashes, night sweats and mood swings, some stress UI at times and vaginal dryness and burning and history of anxiety and depression.  She took estrace and provera for about a week las year and stopped, she like Duavee but too expensive.  Last pap 12/25/15 was normal. PCP is Dr Gerarda Fraction.   Current Medications, Allergies, Past Medical History, Past Surgical History, Family History and Social History were reviewed in [facility] record.     Review of Systems: Patient denies any headaches, hearing loss, fatigue, blurred vision, shortness of breath, chest pain, abdominal pain, problems with bowel movements, or intercourse(not currently active, for 5 months partner has had colon cancer and is on chemo). Has joint pains daily. Has frequent UTIs and is forgetful. Has had regular period the last 7 months since starting Humira, she is sp ablation, and id not bleed for years  See HPI for positives.    Physical Exam:BP 133/80 (BP Location: Left Arm, Patient Position: Sitting, Cuff Size: Normal)   Pulse 72   Ht 5\' 2"  (1.575 m)   Wt 172 lb (78 kg)   LMP 03/21/2019 (Approximate) Comment: bleeding monthly for last 7 months since starting humira   BMI 31.46 kg/m  General:  Well developed, well nourished, no acute distress Skin:  Warm and dry Neck:  Midline trachea, normal thyroid, good ROM, no lymphadenopathy Lungs; Clear to auscultation bilaterally Breast:  No dominant palpable mass, retraction, or nipple discharge Cardiovascular: Regular rate and rhythm Abdomen:  Soft, non tender, no hepatosplenomegaly Pelvic:  External genitalia is normal in appearance, no lesions.  The vagina is irritated and atrophic. Urethra has no lesions or masses. The cervix is nulliparous and  stenotic at the os.Pap with high risk HPV, 16/18 genotyping performed  Uterus is felt to be normal size, shape, and contour.  No adnexal masses or tenderness noted.Bladder is non tender, no masses felt. Rectal: Good sphincter tone, no polyps, or hemorrhoids felt.  Hemoccult negative. Extremities/musculoskeletal:  No swelling or varicosities noted, no clubbing or cyanosis Psych:  No mood changes, alert and cooperative,seems happy Fall risk is low PHQ 9 score is 14, denies being suicidal and is on meds, in fact just started buspar Examination chaperoned by Levy Pupa LPN    Impression and Plan:   1. Routine cervical smear  2. Encounter for gynecological examination with Papanicolaou smear of cervix -Pap sent -Physical in 1 year -Pap in 3 if normal -Labs with PCP -Mammogram every 1-2 years  3. Screening for colorectal cancer   4. Menopause -check [place] -discussed menopause and gave her a handout to review, may try hormone patch  5. Hot flashes   6. Vaginal dryness, menopausal -Will try PVC Meds ordered this encounter  Medications  . conjugated estrogens (PREMARIN) vaginal cream    Sig: Use 0.5 gm in vagina nightly for 2 weeks then 2 x weekly    Dispense:  42.5 g    Refill:  1    Order Specific Question:   Supervising Provider    Answer:   Florian Buff [[id number]]   -Also discussed Samul Dada

## 2019-04-04 NOTE — Patient Instructions (Signed)
Menopause Menopause is the normal time of life when menstrual periods stop completely. It is usually confirmed by 12 months without a menstrual period. The transition to menopause (perimenopause) most often happens between the ages of 45 and 55. During perimenopause, hormone levels change in your body, which can cause symptoms and affect your health. Menopause may increase your risk for:  Loss of bone (osteoporosis), which causes bone breaks (fractures).  Depression.  Hardening and narrowing of the arteries (atherosclerosis), which can cause heart attacks and strokes. What are the causes? This condition is usually caused by a natural change in hormone levels that happens as you get older. The condition may also be caused by surgery to remove both ovaries (bilateral oophorectomy). What increases the risk? This condition is more likely to start at an earlier age if you have certain medical conditions or treatments, including:  A tumor of the pituitary gland in the brain.  A disease that affects the ovaries and hormone production.  Radiation treatment for cancer.  Certain cancer treatments, such as chemotherapy or hormone (anti-estrogen) therapy.  Heavy smoking and excessive alcohol use.  Family history of early menopause. This condition is also more likely to develop earlier in women who are very thin. What are the signs or symptoms? Symptoms of this condition include:  Hot flashes.  Irregular menstrual periods.  Night sweats.  Changes in feelings about sex. This could be a decrease in sex drive or an increased comfort around your sexuality.  Vaginal dryness and thinning of the vaginal walls. This may cause painful intercourse.  Dryness of the skin and development of wrinkles.  Headaches.  Problems sleeping (insomnia).  Mood swings or irritability.  Memory problems.  Weight gain.  Hair growth on the face and chest.  Bladder infections or problems with urinating. How  is this diagnosed? This condition is diagnosed based on your medical history, a physical exam, your age, your menstrual history, and your symptoms. Hormone tests may also be done. How is this treated? In some cases, no treatment is needed. You and your health care provider should make a decision together about whether treatment is necessary. Treatment will be based on your individual condition and preferences. Treatment for this condition focuses on managing symptoms. Treatment may include:  Menopausal hormone therapy (MHT).  Medicines to treat specific symptoms or complications.  Acupuncture.  Vitamin or herbal supplements. Before starting treatment, make sure to let your health care provider know if you have a personal or family history of:  Heart disease.  Breast cancer.  Blood clots.  Diabetes.  Osteoporosis. Follow these instructions at home: Lifestyle  Do not use any products that contain nicotine or tobacco, such as cigarettes and e-cigarettes. If you need help quitting, ask your health care provider.  Get at least 30 minutes of physical activity on 5 or more days each week.  Avoid alcoholic and caffeinated beverages, as well as spicy foods. This may help prevent hot flashes.  Get 7-8 hours of sleep each night.  If you have hot flashes, try: ? Dressing in layers. ? Avoiding things that may trigger hot flashes, such as spicy food, warm places, or stress. ? Taking slow, deep breaths when a hot flash starts. ? Keeping a fan in your home and office.  Find ways to manage stress, such as deep breathing, meditation, or journaling.  Consider going to group therapy with other women who are having menopause symptoms. Ask your health care provider about recommended group therapy meetings. Eating and   drinking  Eat a healthy, balanced diet that contains whole grains, lean protein, low-fat dairy, and plenty of fruits and vegetables.  Your health care provider may recommend  adding more soy to your diet. Foods that contain soy include tofu, tempeh, and soy milk.  Eat plenty of foods that contain calcium and vitamin D for bone health. Items that are rich in calcium include low-fat milk, yogurt, beans, almonds, sardines, broccoli, and kale. Medicines  Take over-the-counter and prescription medicines only as told by your health care provider.  Talk with your health care provider before starting any herbal supplements. If prescribed, take vitamins and supplements as told by your health care provider. These may include: ? Calcium. Women age 51 and older should get 1,200 mg (milligrams) of calcium every day. ? Vitamin D. Women need 600-800 International Units of vitamin D each day. ? Vitamins B12 and B6. Aim for 50 micrograms of B12 and 1.5 mg of B6 each day. General instructions  Keep track of your menstrual periods, including: ? When they occur. ? How heavy they are and how long they last. ? How much time passes between periods.  Keep track of your symptoms, noting when they start, how often you have them, and how long they last.  Use vaginal lubricants or moisturizers to help with vaginal dryness and improve comfort during sex.  Keep all follow-up visits as told by your health care provider. This is important. This includes any group therapy or counseling. Contact a health care provider if:  You are still having menstrual periods after age 55.  You have pain during sex.  You have not had a period for 12 months and you develop vaginal bleeding. Get help right away if:  You have: ? Severe depression. ? Excessive vaginal bleeding. ? Pain when you urinate. ? A fast or irregular heart beat (palpitations). ? Severe headaches. ? Abdomen (abdominal) pain or severe indigestion.  You fell and you think you have a broken bone.  You develop leg or chest pain.  You develop vision problems.  You feel a lump in your breast. Summary  Menopause is the normal  time of life when menstrual periods stop completely. It is usually confirmed by 12 months without a menstrual period.  The transition to menopause (perimenopause) most often happens between the ages of 45 and 55.  Symptoms can be managed through medicines, lifestyle changes, and complementary therapies such as acupuncture.  Eat a balanced diet that is rich in nutrients to promote bone health and heart health and to manage symptoms during menopause. This information is not intended to replace advice given to you by your health care provider. Make sure you discuss any questions you have with your health care provider. Document Released: 08/22/2003 Document Revised: 05/14/2017 Document Reviewed: 07/04/2016 Elsevier Patient Education  2020 [employer]vier Inc.  

## 2019-04-05 ENCOUNTER — Telehealth: Payer: Self-pay | Admitting: Adult Health

## 2019-04-05 LAB — FOLLICLE STIMULATING HORMONE: FSH: 5.4 m[IU]/mL

## 2019-04-05 NOTE — Telephone Encounter (Signed)
Left message that [place] 5.4, so ovaries still working some, if wants to try the patch let me know

## 2019-04-06 ENCOUNTER — Telehealth: Payer: Self-pay | Admitting: *Deleted

## 2019-04-06 MED ORDER — ESTRADIOL-LEVONORGESTREL 0.045-0.015 MG/DAY TD PTWK: 1.0000 | MEDICATED_PATCH | TRANSDERMAL | 12 refills | Status: DC

## 2019-04-06 NOTE — Telephone Encounter (Signed)
Pt would like HRT patch sent in. She will check with pharmacy later, doesn't need call back unless [name] wants to tell her anything else.

## 2019-04-06 NOTE — Telephone Encounter (Signed)
Will rx climara pro

## 2019-04-06 NOTE — Addendum Note (Signed)
Addended by: Derrek Monaco A on: 04/06/2019 02:13 PM   Modules accepted: Orders

## 2019-04-07 LAB — CYTOLOGY - PAP
Comment: NEGATIVE
Diagnosis: NEGATIVE
High risk HPV: NEGATIVE

## 2019-04-12 DIAGNOSIS — M79643 Pain in unspecified hand: Secondary | ICD-10-CM | POA: Diagnosis not present

## 2019-04-12 DIAGNOSIS — R748 Abnormal levels of other serum enzymes: Secondary | ICD-10-CM | POA: Diagnosis not present

## 2019-04-12 DIAGNOSIS — M1990 Unspecified osteoarthritis, unspecified site: Secondary | ICD-10-CM | POA: Diagnosis not present

## 2019-04-12 DIAGNOSIS — M25579 Pain in unspecified ankle and joints of unspecified foot: Secondary | ICD-10-CM | POA: Diagnosis not present

## 2019-04-12 DIAGNOSIS — L409 Psoriasis, unspecified: Secondary | ICD-10-CM | POA: Diagnosis not present

## 2019-04-12 DIAGNOSIS — Z79899 Other long term (current) drug therapy: Secondary | ICD-10-CM | POA: Diagnosis not present

## 2019-04-12 DIAGNOSIS — L4050 Arthropathic psoriasis, unspecified: Secondary | ICD-10-CM | POA: Diagnosis not present

## 2019-04-13 ENCOUNTER — Telehealth: Payer: Self-pay | Admitting: *Deleted

## 2019-04-13 NOTE — Telephone Encounter (Signed)
Pt states that insurance not covering climara, would like to see if Danise Mina can send in a different medication.

## 2019-04-14 MED ORDER — ESTRADIOL 1 MG PO TABS: 1.0000 mg | ORAL_TABLET | Freq: Every day | ORAL | 3 refills | Status: DC

## 2019-04-14 MED ORDER — PROGESTERONE MICRONIZED 200 MG PO CAPS: 200.0000 mg | ORAL_CAPSULE | Freq: Every day | ORAL | 3 refills | Status: DC

## 2019-04-14 NOTE — Telephone Encounter (Signed)
Will rx estrace 1 mg and prometrium , left message to check with drug store not sure if insurance will over or not

## 2019-04-17 ENCOUNTER — Telehealth: Payer: Self-pay | Admitting: *Deleted

## 2019-04-17 NOTE — Telephone Encounter (Signed)
Pt left message that she needs to know if she should take Progesterone, Estrogen, and the Premarin all together.

## 2019-04-17 NOTE — Telephone Encounter (Signed)
Pt aware can use PVC with estrace and prometrium, spotted some with PVC, call me in about 2 weeks in follow up

## 2019-05-02 ENCOUNTER — Telehealth: Payer: Self-pay | Admitting: *Deleted

## 2019-05-02 NOTE — Telephone Encounter (Signed)
Pt left message that she needs to speak with [name] regarding her HRT.

## 2019-05-02 NOTE — Telephone Encounter (Signed)
Pt says HRT has helped with moods a lot.Feels much better

## 2019-05-19 ENCOUNTER — Ambulatory Visit (INDEPENDENT_AMBULATORY_CARE_PROVIDER_SITE_OTHER): Payer: Medicare HMO | Admitting: Urology

## 2019-05-19 ENCOUNTER — Other Ambulatory Visit: Payer: Self-pay

## 2019-05-19 DIAGNOSIS — R1012 Left upper quadrant pain: Secondary | ICD-10-CM

## 2019-05-19 DIAGNOSIS — N3946 Mixed incontinence: Secondary | ICD-10-CM

## 2019-05-19 DIAGNOSIS — N3281 Overactive bladder: Secondary | ICD-10-CM | POA: Diagnosis not present

## 2019-05-19 DIAGNOSIS — N3010 Interstitial cystitis (chronic) without hematuria: Secondary | ICD-10-CM | POA: Diagnosis not present

## 2019-05-19 DIAGNOSIS — N200 Calculus of kidney: Secondary | ICD-10-CM

## 2019-05-31 DIAGNOSIS — R69 Illness, unspecified: Secondary | ICD-10-CM | POA: Diagnosis not present

## 2019-05-31 DIAGNOSIS — M150 Primary generalized (osteo)arthritis: Secondary | ICD-10-CM | POA: Diagnosis not present

## 2019-05-31 DIAGNOSIS — G4700 Insomnia, unspecified: Secondary | ICD-10-CM | POA: Diagnosis not present

## 2019-05-31 DIAGNOSIS — Z6830 Body mass index (BMI) 30.0-30.9, adult: Secondary | ICD-10-CM | POA: Diagnosis not present

## 2019-05-31 DIAGNOSIS — M2550 Pain in unspecified joint: Secondary | ICD-10-CM | POA: Diagnosis not present

## 2019-06-13 DIAGNOSIS — M1990 Unspecified osteoarthritis, unspecified site: Secondary | ICD-10-CM | POA: Diagnosis not present

## 2019-06-13 DIAGNOSIS — L409 Psoriasis, unspecified: Secondary | ICD-10-CM | POA: Diagnosis not present

## 2019-06-13 DIAGNOSIS — M79643 Pain in unspecified hand: Secondary | ICD-10-CM | POA: Diagnosis not present

## 2019-06-13 DIAGNOSIS — Z79899 Other long term (current) drug therapy: Secondary | ICD-10-CM | POA: Diagnosis not present

## 2019-06-13 DIAGNOSIS — L4050 Arthropathic psoriasis, unspecified: Secondary | ICD-10-CM | POA: Diagnosis not present

## 2019-06-13 DIAGNOSIS — M25579 Pain in unspecified ankle and joints of unspecified foot: Secondary | ICD-10-CM | POA: Diagnosis not present

## 2019-06-20 DIAGNOSIS — R69 Illness, unspecified: Secondary | ICD-10-CM | POA: Diagnosis not present

## 2019-06-26 ENCOUNTER — Other Ambulatory Visit: Payer: Self-pay

## 2019-06-26 DIAGNOSIS — N200 Calculus of kidney: Secondary | ICD-10-CM

## 2019-07-03 DIAGNOSIS — R69 Illness, unspecified: Secondary | ICD-10-CM | POA: Diagnosis not present

## 2019-07-04 IMAGING — DX DG ABDOMEN 1V
1 series · 1 of 1 positions shown · non-contrast
Comparison: Abdominal radiograph 11/16/2017

CLINICAL DATA: Patient with history of bilateral nephrolithiasis.
Bilateral pain.

EXAM:
ABDOMEN - 1 VIEW

[abdomen kub]
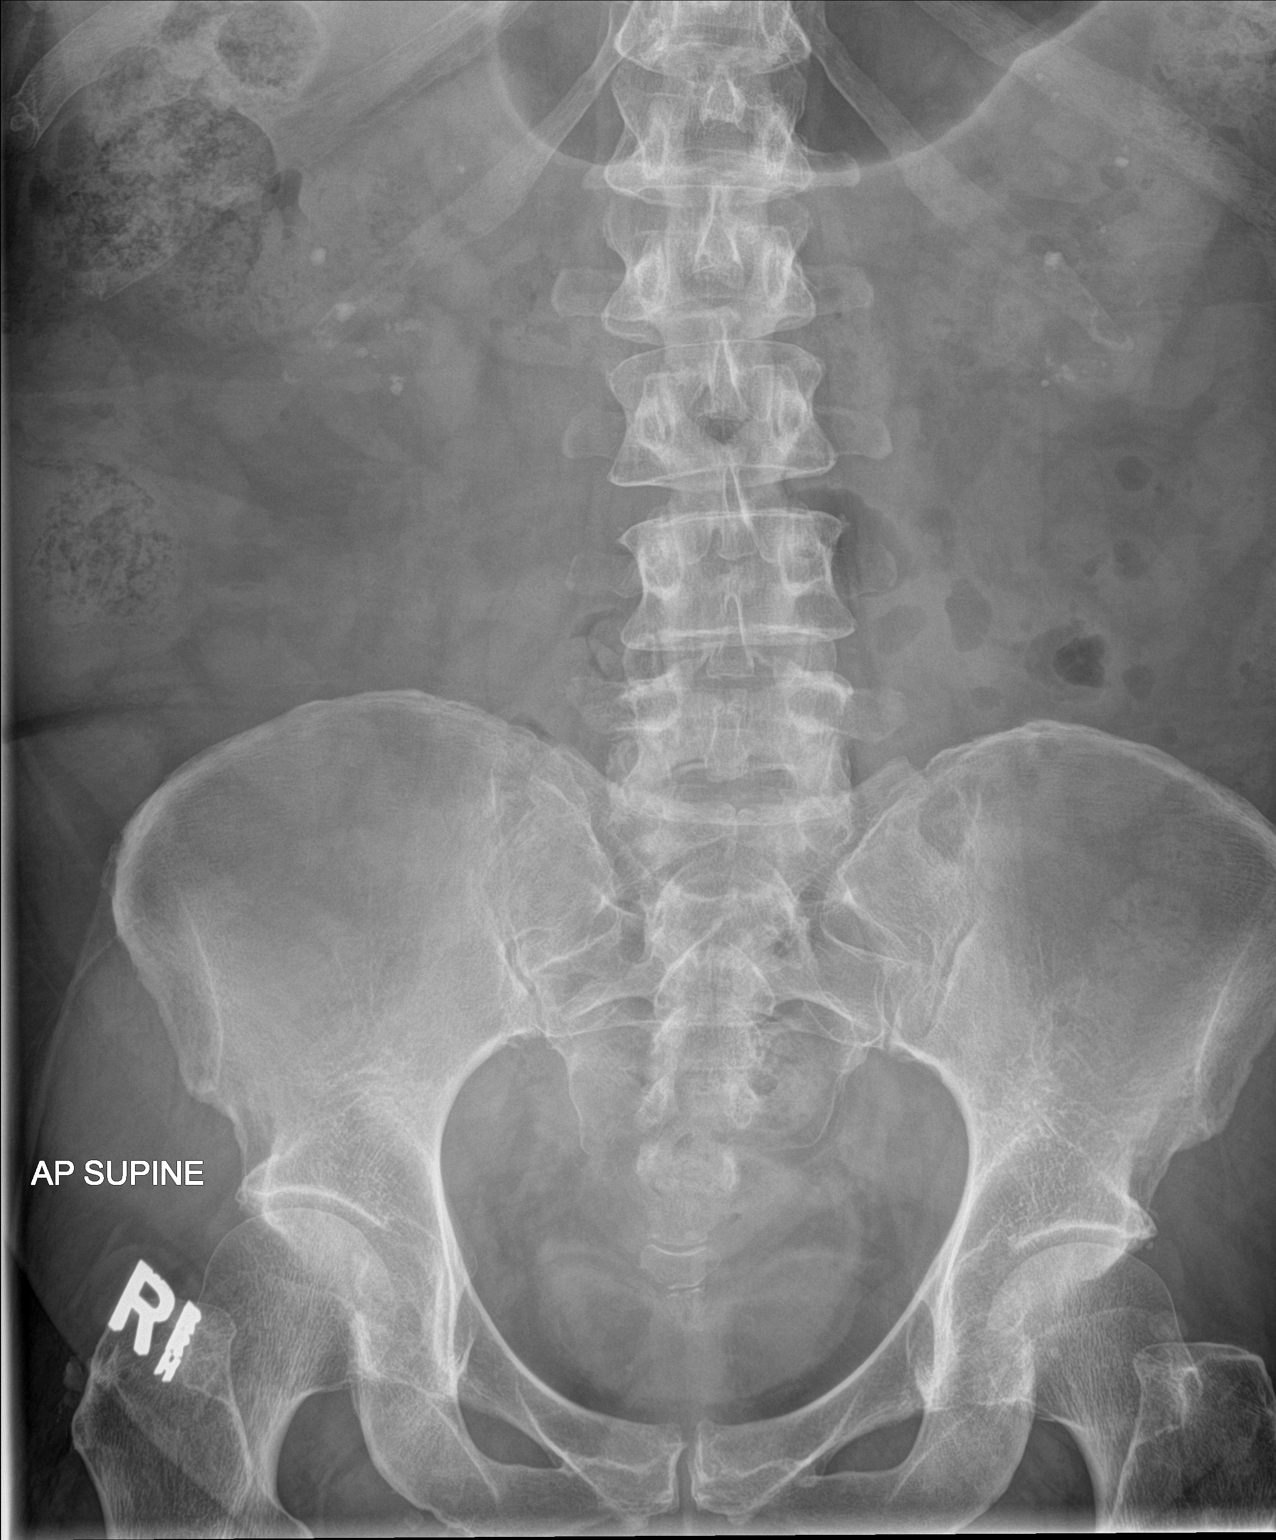

[1 of 1 positions shown; findings below may reference images not displayed]

FINDINGS: Multiple stones are demonstrated projecting in the expected location
of the kidneys bilaterally. No calcifications projecting over the
pelvis. Stool throughout the colon. Nonobstructed bowel gas pattern.
IMPRESSION: Bilateral nephrolithiasis.

No calcific density projecting over the pelvis.

## 2019-07-21 DIAGNOSIS — N390 Urinary tract infection, site not specified: Secondary | ICD-10-CM | POA: Diagnosis not present

## 2019-07-27 DIAGNOSIS — R69 Illness, unspecified: Secondary | ICD-10-CM | POA: Diagnosis not present

## 2019-07-27 DIAGNOSIS — M1991 Primary osteoarthritis, unspecified site: Secondary | ICD-10-CM | POA: Diagnosis not present

## 2019-07-27 DIAGNOSIS — L4050 Arthropathic psoriasis, unspecified: Secondary | ICD-10-CM | POA: Diagnosis not present

## 2019-07-27 DIAGNOSIS — Z6829 Body mass index (BMI) 29.0-29.9, adult: Secondary | ICD-10-CM | POA: Diagnosis not present

## 2019-08-07 ENCOUNTER — Other Ambulatory Visit: Payer: Self-pay | Admitting: Adult Health

## 2019-08-09 ENCOUNTER — Ambulatory Visit
Admission: EM | Admit: 2019-08-09 | Discharge: 2019-08-09 | Disposition: A | Discharge status: Home / Self Care | Payer: Medicare HMO | Attending: Emergency Medicine | Admitting: Emergency Medicine

## 2019-08-09 ENCOUNTER — Other Ambulatory Visit: Payer: Self-pay

## 2019-08-09 DIAGNOSIS — R300 Dysuria: Secondary | ICD-10-CM | POA: Diagnosis not present

## 2019-08-09 DIAGNOSIS — R109 Unspecified abdominal pain: Secondary | ICD-10-CM | POA: Diagnosis not present

## 2019-08-09 DIAGNOSIS — R350 Frequency of micturition: Secondary | ICD-10-CM | POA: Diagnosis not present

## 2019-08-09 LAB — POCT URINALYSIS DIP (MANUAL ENTRY)
Bilirubin, UA: NEGATIVE
Glucose, UA: NEGATIVE mg/dL
Ketones, POC UA: NEGATIVE mg/dL
Nitrite, UA: NEGATIVE
Protein Ur, POC: 30 mg/dL — AB
Spec Grav, UA: 1.015 (ref 1.010–1.025)
Urobilinogen, UA: 0.2 E.U./dL
pH, UA: 6.5 (ref 5.0–8.0)

## 2019-08-09 MED ORDER — PHENAZOPYRIDINE HCL 200 MG PO TABS: 200.0000 mg | ORAL_TABLET | Freq: Three times a day (TID) | ORAL | 0 refills | Status: DC

## 2019-08-09 MED ORDER — CEPHALEXIN 500 MG PO CAPS: 500.0000 mg | ORAL_CAPSULE | Freq: Two times a day (BID) | ORAL | 0 refills | Status: AC

## 2019-08-09 MED ORDER — IBUPROFEN 800 MG PO TABS: 800.0000 mg | ORAL_TABLET | Freq: Two times a day (BID) | ORAL | 0 refills | Status: AC | PRN

## 2019-08-09 MED ORDER — KETOROLAC TROMETHAMINE 30 MG/ML IJ SOLN
30.0000 mg | Freq: Once | INTRAMUSCULAR | Status: AC
  Administered 2019-08-09: 30 mg via INTRAMUSCULAR

## 2019-08-09 NOTE — ED Provider Notes (Signed)
[facility]   CC: Flank pain and burning with urination  SUBJECTIVE:  Bethany King is a 49 y.o. female who complains of left flank pain, dysuria, and urinary frequency x 3 days.  Patient denies a precipitating event.  Localizes the pain to the left flank.  Pain is constant and describes it as dull.  Has tried OTC medications without relief.  Symptoms are made worse with urination.  Recently treated for UTI on 07/17/19 with cipro, took for three days and discontinued due to joint pain.  Complains of associated nausea.  Denies fever, chills, vomiting, abdominal pain, abnormal vaginal discharge or bleeding, hematuria.    LMP: No LMP recorded. Patient has had an ablation.  ROS: As in HPI.  All other pertinent ROS negative.     Past Medical History:  Diagnosis Date  . Allergic rhinitis   . Anxiety   . Arthritis   . Bladder pain   . Complication of anesthesia   . Costochondritis    hx  . DDD (degenerative disc disease), lumbar   . Depression   . GERD (gastroesophageal reflux disease)   . Hepatic hemangioma 2006   stable  . History of adenomatous polyp of colon   . History of colitis   . History of gastritis   . History of kidney stones   . HTN (hypertension)   . Irritable bowel syndrome (IBS)   . Medullary sponge kidney    left  . Migraine   . Nephrolithiasis    bilateral-- nonobstructive per CT  . PONV (postoperative nausea and vomiting)   . Psoriatic arthritis ([place])   . Psoriatic arthritis ([place])   . Spinal stenosis   . Urgency of urination    Past Surgical History:  Procedure Laterality Date  . ANTERIOR CERVICAL DECOMP/DISCECTOMY FUSION  02/26/2014   C5 - C6; fusion with plate  . BIOPSY  01/04/2019   Procedure: BIOPSY;  Surgeon: Rogene Houston, MD;  [facility]: [facility];  Service: Endoscopy;;  gastric polyps  . CARPAL TUNNEL RELEASE Bilateral right 04-01-2010/  left 05-30-2010  . CHOLECYSTECTOMY N/A 07/14/2013   Procedure: LAPAROSCOPIC  CHOLECYSTECTOMY;  Surgeon: Jamesetta So, MD;  Location: [facility];  Service: General;  Laterality: N/A;  . COLONOSCOPY  10-13-2010  . COLONOSCOPY N/A 01/04/2019   Procedure: COLONOSCOPY;  Surgeon: Rogene Houston, MD;  [facility];  Service: Endoscopy;  Laterality: N/A;  . CYSTO WITH HYDRODISTENSION N/A 04/02/2015   Procedure: CYSTOSCOPY/HYDRODISTENSION, BLADDER BIOPSY WITH FULGURATION;  Surgeon: Irine Seal, MD;  Location: [facility];  Service: Urology;  Laterality: N/A;  . CYSTO WITH HYDRODISTENSION N/A 03/25/2018   Procedure: CYSTOSCOPY/HYDRODISTENSION, INSTILL MARCAIN AND PYRIDIUM;  Surgeon: Irine Seal, MD;  Location: [facility];  Service: Urology;  Laterality: N/A;  . CYSTOSCOPY W/ URETERAL STENT PLACEMENT Bilateral 04/08/2016   Procedure: CYSTOSCOPY WITH BILATERAL RETROGRADE PYELOGRAM, BILATERAL URETERAL STENT PLACEMENT;  Surgeon: Cleon Gustin, MD;  Location: [facility];  Service: Urology;  Laterality: Bilateral;  . CYSTOSCOPY W/ URETERAL STENT PLACEMENT Left 04/15/2016   Procedure: CYSTOSCOPY WITH RETROGRADE PYELOGRAM/URETERAL STENT PLACEMENT;  Surgeon: Cleon Gustin, MD;  Location: [facility];  Service: Urology;  Laterality: Left;  . CYSTOSCOPY WITH HOLMIUM LASER LITHOTRIPSY Right 04/08/2016   Procedure: CYSTOSCOPY WITH RIGHT RENAL STONE EXTRACTION WITH HOLMIUM LASER LITHOTRIPSY;  Surgeon: Cleon Gustin, MD;  [facility];  Service: Urology;  Laterality: Right;  . CYSTOSCOPY WITH HOLMIUM LASER LITHOTRIPSY Left 04/15/2016   Procedure: CYSTOSCOPY WITH  HOLMIUM LASER LITHOTRIPSY;  Surgeon: Cleon Gustin, MD;  [facility];  Service: Urology;  Laterality: Left;  . CYSTOSCOPY WITH RETROGRADE PYELOGRAM, URETEROSCOPY AND STENT PLACEMENT Left 07/05/2017   Procedure: CYSTOSCOPY WITH RETROGRADE PYELOGRAM, URETEROSCOPY AND STENT PLACEMENT;  Surgeon: Cleon Gustin, MD;  [facility];  Service: Urology;  Laterality: Left;  . DIAGNOSTIC LAPAROSCOPY    .  DILATION AND CURETTAGE OF UTERUS    . DILITATION & CURRETTAGE/HYSTROSCOPY WITH THERMACHOICE ABLATION N/A 09/07/2012   Procedure: DILATATION & CURETTAGE/HYSTEROSCOPY WITH THERMACHOICE ABLATION;  Surgeon: Florian Buff, MD;  [facility];  Service: Gynecology;  Laterality: N/A;  . ESOPHAGOGASTRODUODENOSCOPY  10/14/10  . ESOPHAGOGASTRODUODENOSCOPY N/A 01/04/2019   Procedure: ESOPHAGOGASTRODUODENOSCOPY (EGD);  Surgeon: Rogene Houston, MD;  [facility];  Service: Endoscopy;  Laterality: N/A;  9:30  . EXTRACORPOREAL SHOCK WAVE LITHOTRIPSY  multiple  . HOLMIUM LASER APPLICATION Left [id number]   Procedure: HOLMIUM LASER APPLICATION;  Surgeon: Cleon Gustin, MD;  Location[facility];  Service: Urology;  Laterality: Left;  . PERCUTANEOUS NEPHROSTOLITHOTOMY  2003  . PLANTAR FASCIA SURGERY Bilateral right 2008//  left 2010  . POLYPECTOMY  01/04/2019   Procedure: POLYPECTOMY;  Surgeon: Rogene Houston, MD;  [facility];  Service: Endoscopy;;  colon  . STONE EXTRACTION WITH BASKET Left 04/15/2016   Procedure: STONE EXTRACTION WITH BASKET;  Surgeon: Cleon Gustin, MD;  Location[facility];  Service: Urology;  Laterality: Left;   Allergies  Allergen Reactions  . Hydromorphone Itching  . Prednisone Other (See Comments)    Increase anxiety symptoms, insomnia  . Tramadol Hcl Other (See Comments)    dizziness  . Sulfa Antibiotics Rash   No current facility-administered medications on file prior to encounter.   Current Outpatient Medications on File Prior to Encounter  Medication Sig Dispense Refill  . Secukinumab, 300 MG Dose, (COSENTYX SENSOREADY, 300 MG,) 150 MG/ML SOAJ Inject into the skin every 30 (thirty) days.    . ALPRAZolam (XANAX) 1 MG tablet Take 0.5 mg by mouth at bedtime as needed for anxiety.     . calcium carbonate (TUMS - DOSED IN MG ELEMENTAL CALCIUM) 500 MG chewable tablet Chew 2-3 tablets by mouth daily as needed for indigestion or heartburn.    .  DULoxetine (CYMBALTA) 60 MG capsule Take 60 mg by mouth daily.    Marland Kitchen estradiol (ESTRACE) 1 MG tablet TAKE 1 TABLET(1 MG) BY MOUTH DAILY 30 tablet 3  . potassium chloride SA (K-DUR,KLOR-CON) 20 MEQ tablet Take 40 mEq by mouth daily.     . Probiotic Product (ALIGN PO) Take 1 capsule by mouth daily.     . progesterone (PROMETRIUM) 200 MG capsule TAKE 1 CAPSULE(200 MG) BY MOUTH DAILY 30 capsule 3  . triamterene-hydrochlorothiazide (DYAZIDE) 37.5-25 MG capsule Take 1 capsule by mouth daily.   11  . [DISCONTINUED] Adalimumab (HUMIRA PEN) 40 MG/0.4ML PNKT Inject 40 mg into the skin every 14 (fourteen) days. Twice a month     . [DISCONTINUED] pantoprazole (PROTONIX) 40 MG tablet Take 1 tablet (40 mg total) by mouth daily before breakfast.     Social History   Socioeconomic History  . Marital status: Divorced    Spouse name: Not on file  . Number of children: 0  . Years of education: Not on file  . Highest education level: Not on file  Occupational History  . Occupation: ORDER PROCESSER    Employer: [employer]  Tobacco Use  . Smoking  status: Never Smoker  . Smokeless tobacco: Never Used  Substance and Sexual Activity  . Alcohol use: No  . Drug use: No  . Sexual activity: Yes    Birth control/protection: Surgical    Comment: ablation  Other Topics Concern  . Not on file  Social History Narrative  . Not on file   Social Determinants of Health   Financial Resource Strain:   . Difficulty of Paying Living Expenses: Not on file  Food Insecurity:   . Worried About Charity fundraiser in the Last Year: Not on file  . Ran Out of Food in the Last Year: Not on file  Transportation Needs:   . Lack of Transportation (Medical): Not on file  . Lack of Transportation (Non-Medical): Not on file  Physical Activity:   . Days of Exercise per Week: Not on file  . Minutes of Exercise per Session: Not on file  Stress:   . Feeling of Stress : Not on file  Social Connections:   . Frequency of  Communication with Friends and Family: Not on file  . Frequency of Social Gatherings with Friends and Family: Not on file  . Attends Religious Services: Not on file  . Active Member of Clubs or Organizations: Not on file  . Attends Archivist Meetings: Not on file  . Marital Status: Not on file  Intimate Partner Violence:   . Fear of Current or Ex-Partner: Not on file  . Emotionally Abused: Not on file  . Physically Abused: Not on file  . Sexually Abused: Not on file   Family History  Problem Relation Age of Onset  . Irritable bowel syndrome Mother   . Scleroderma Mother   . Rheum arthritis Mother   . Asthma Mother   . Melanoma Father   . Cancer Father        Melanoma  . Colon cancer Cousin 72       second cousin Sunday Spillers [place])  . Cancer Maternal Grandmother        Ovarian  . Diabetes Maternal Grandmother   . Thyroid disease Maternal Aunt     OBJECTIVE:  Vitals:   08/09/19 0956  BP: 133/77  Pulse: 89  Resp: 16  Temp: 98.9 F (37.2 C)  TempSrc: Oral  SpO2: 97%   General appearance: Alert; in no acute distress HEENT: NCAT.  PERRL, EOMI grossly; Oropharynx clear.  Lungs: clear to auscultation bilaterally without adventitious breath sounds Heart: regular rate and rhythm.   Abdomen: soft; non-distended; no tenderness; bowel sounds present; no guarding Back: + LT sided CVA tenderness Extremities: no edema; symmetrical with no gross deformities Skin: warm and dry Neurologic: Ambulates from chair to exam table without difficulty Psychological: alert and cooperative; normal mood and affect  Labs Reviewed  POCT URINALYSIS DIP (MANUAL ENTRY) - Abnormal; Notable for the following components:      Result Value   Blood, UA trace-lysed (*)    Protein Ur, POC =30 (*)    Leukocytes, UA Small (1+) (*)    All other components within normal limits  URINE CULTURE    ASSESSMENT & PLAN:  1. Dysuria   2. Lt flank pain   3. Urinary frequency     Meds ordered  this encounter  Medications  . ketorolac (TORADOL) 30 MG/ML injection 30 mg  . ibuprofen (ADVIL) 800 MG tablet    Sig: Take 1 tablet (800 mg total) by mouth 2 (two) times daily as needed for up to 5  days.    Dispense:  15 tablet    Refill:  0    Order Specific Question:   Supervising Provider    Answer:   Raylene Everts [id number]  . cephALEXin (KEFLEX) 500 MG capsule    Sig: Take 1 capsule (500 mg total) by mouth 2 (two) times daily for 10 days.    Dispense:  20 capsule    Refill:  0    Order Specific Question:   Supervising Provider    Answer:   Raylene Everts [id number]  . phenazopyridine (PYRIDIUM) 200 MG tablet    Sig: Take 1 tablet (200 mg total) by mouth 3 (three) times daily.    Dispense:  6 tablet    Refill:  0    Order Specific Question:   Supervising Provider    Answer:   Raylene Everts [id number]   Urine concerning for UTI Urine culture sent.  We will call you with abnormal results.   Push fluids and get plenty of rest. Toradol shot given in office Ibuprofen 800 mg prescribed.  Take as directed   Take antibiotic as directed and to completion Take pyridium as prescribed and as needed for symptomatic relief Follow up with PCP if symptoms persists Return here or go to ER if you have any new or worsening symptoms such as fever, worsening abdominal pain, nausea/vomiting, flank pain, etc...  Outlined signs and symptoms indicating need for more acute intervention. Patient verbalized understanding. After Visit Summary given.     Lestine Box, PA-C 08/09/19 1037

## 2019-08-09 NOTE — Discharge Instructions (Signed)
Urine concerning for UTI Urine culture sent.  We will call you with abnormal results.   Push fluids and get plenty of rest. Toradol shot given in office Ibuprofen 800 mg prescribed.  Take as directed   Take antibiotic as directed and to completion Take pyridium as prescribed and as needed for symptomatic relief Follow up with PCP if symptoms persists Return here or go to ER if you have any new or worsening symptoms such as fever, worsening abdominal pain, nausea/vomiting, flank pain, etc..Marland Kitchen

## 2019-08-09 NOTE — ED Triage Notes (Signed)
Pt presents to UC w/ c/o recurrent UTI since 07/17/19.  Did not finish course of Cipro prescribed on 07/17/19. Left flank pain days, burning on urination, dysuria, frequency x3 days Hx of kidney stones.

## 2019-08-10 LAB — URINE CULTURE: Culture: NO GROWTH

## 2019-08-31 ENCOUNTER — Ambulatory Visit: Payer: Medicare HMO

## 2019-09-01 ENCOUNTER — Ambulatory Visit: Payer: Medicare HMO | Attending: Internal Medicine

## 2019-09-01 DIAGNOSIS — Z23 Encounter for immunization: Secondary | ICD-10-CM

## 2019-09-01 NOTE — Progress Notes (Signed)
Covid-19 Vaccination Clinic  Name:  Bethany King    MRN: [id number] DOB: October 08, 1970  09/01/2019  [name] was observed post Covid-19 immunization for 15 minutes without incident. She was provided with Vaccine Information Sheet and instruction to access the V-Safe system.   [name] was instructed to call 911 with any severe reactions post vaccine: Marland Kitchen Difficulty breathing  . Swelling of face and throat  . A fast heartbeat  . A bad rash all over body  . Dizziness and weakness   Immunizations Administered    Name Date Dose VIS Date Route   Moderna COVID-19 Vaccine 09/01/2019 10:32 AM 0.5 mL 05/16/2019 Intramuscular   Manufacturer: Moderna   Lot: [id number]   NDC: 52841-324-40

## 2019-09-28 DIAGNOSIS — L409 Psoriasis, unspecified: Secondary | ICD-10-CM | POA: Diagnosis not present

## 2019-09-28 DIAGNOSIS — L4050 Arthropathic psoriasis, unspecified: Secondary | ICD-10-CM | POA: Diagnosis not present

## 2019-09-28 DIAGNOSIS — M1990 Unspecified osteoarthritis, unspecified site: Secondary | ICD-10-CM | POA: Diagnosis not present

## 2019-09-28 DIAGNOSIS — Z79899 Other long term (current) drug therapy: Secondary | ICD-10-CM | POA: Diagnosis not present

## 2019-09-28 DIAGNOSIS — M25579 Pain in unspecified ankle and joints of unspecified foot: Secondary | ICD-10-CM | POA: Diagnosis not present

## 2019-09-28 DIAGNOSIS — M79643 Pain in unspecified hand: Secondary | ICD-10-CM | POA: Diagnosis not present

## 2019-09-29 DIAGNOSIS — M5412 Radiculopathy, cervical region: Secondary | ICD-10-CM | POA: Diagnosis not present

## 2019-09-29 DIAGNOSIS — N342 Other urethritis: Secondary | ICD-10-CM | POA: Diagnosis not present

## 2019-09-29 DIAGNOSIS — N3010 Interstitial cystitis (chronic) without hematuria: Secondary | ICD-10-CM | POA: Diagnosis not present

## 2019-09-29 DIAGNOSIS — Z6829 Body mass index (BMI) 29.0-29.9, adult: Secondary | ICD-10-CM | POA: Diagnosis not present

## 2019-09-29 DIAGNOSIS — L4050 Arthropathic psoriasis, unspecified: Secondary | ICD-10-CM | POA: Diagnosis not present

## 2019-09-29 DIAGNOSIS — R69 Illness, unspecified: Secondary | ICD-10-CM | POA: Diagnosis not present

## 2019-09-29 DIAGNOSIS — M5417 Radiculopathy, lumbosacral region: Secondary | ICD-10-CM | POA: Diagnosis not present

## 2019-09-29 DIAGNOSIS — G894 Chronic pain syndrome: Secondary | ICD-10-CM | POA: Diagnosis not present

## 2019-09-29 DIAGNOSIS — M2550 Pain in unspecified joint: Secondary | ICD-10-CM | POA: Diagnosis not present

## 2019-09-29 DIAGNOSIS — Z1389 Encounter for screening for other disorder: Secondary | ICD-10-CM | POA: Diagnosis not present

## 2019-10-02 DIAGNOSIS — D225 Melanocytic nevi of trunk: Secondary | ICD-10-CM | POA: Diagnosis not present

## 2019-10-02 DIAGNOSIS — Z1283 Encounter for screening for malignant neoplasm of skin: Secondary | ICD-10-CM | POA: Diagnosis not present

## 2019-10-03 ENCOUNTER — Ambulatory Visit: Payer: Medicare HMO | Attending: Internal Medicine

## 2019-10-03 DIAGNOSIS — Z23 Encounter for immunization: Secondary | ICD-10-CM

## 2019-10-03 NOTE — Progress Notes (Signed)
Covid-19 Vaccination Clinic  Name:  Bethany King    MRN: [id number] DOB: 1970-06-25  10/03/2019  [name] was observed post Covid-19 immunization for 15 minutes without incident. She was provided with Vaccine Information Sheet and instruction to access the V-Safe system.   [name] was instructed to call 911 with any severe reactions post vaccine: Marland Kitchen Difficulty breathing  . Swelling of face and throat  . A fast heartbeat  . A bad rash all over body  . Dizziness and weakness   Immunizations Administered    Name Date Dose VIS Date Route   Moderna COVID-19 Vaccine 10/03/2019 11:20 AM 0.5 mL 05/2019 Intramuscular   Manufacturer: Gala Murdoch   Lot: [id number]   NDC: 78469-629-52      Covid-19 Vaccination Clinic  Name:  Bethany King    MRN: [id number] DOB: 10-12-1970  10/03/2019  [name] was observed post Covid-19 immunization for 15 minutes without incident. She was provided with Vaccine Information Sheet and instruction to access the V-Safe system.   [name] was instructed to call 911 with any severe reactions post vaccine: Marland Kitchen Difficulty breathing  . Swelling of face and throat  . A fast heartbeat  . A bad rash all over body  . Dizziness and weakness   Immunizations Administered    Name Date Dose VIS Date Route   Moderna COVID-19 Vaccine 10/03/2019 11:20 AM 0.5 mL 05/2019 Intramuscular   Manufacturer: Moderna   Lot: [id number]   NDC: 64403-474-25

## 2019-10-26 DIAGNOSIS — R69 Illness, unspecified: Secondary | ICD-10-CM | POA: Diagnosis not present

## 2019-11-09 DIAGNOSIS — L4050 Arthropathic psoriasis, unspecified: Secondary | ICD-10-CM | POA: Diagnosis not present

## 2019-11-09 DIAGNOSIS — M1990 Unspecified osteoarthritis, unspecified site: Secondary | ICD-10-CM | POA: Diagnosis not present

## 2019-11-09 DIAGNOSIS — Z79899 Other long term (current) drug therapy: Secondary | ICD-10-CM | POA: Diagnosis not present

## 2019-11-09 DIAGNOSIS — M79643 Pain in unspecified hand: Secondary | ICD-10-CM | POA: Diagnosis not present

## 2019-11-09 DIAGNOSIS — M25579 Pain in unspecified ankle and joints of unspecified foot: Secondary | ICD-10-CM | POA: Diagnosis not present

## 2019-11-09 DIAGNOSIS — L409 Psoriasis, unspecified: Secondary | ICD-10-CM | POA: Diagnosis not present

## 2019-11-10 DIAGNOSIS — R69 Illness, unspecified: Secondary | ICD-10-CM | POA: Diagnosis not present

## 2019-11-10 DIAGNOSIS — R300 Dysuria: Secondary | ICD-10-CM | POA: Diagnosis not present

## 2019-11-10 DIAGNOSIS — Z6829 Body mass index (BMI) 29.0-29.9, adult: Secondary | ICD-10-CM | POA: Diagnosis not present

## 2019-11-10 DIAGNOSIS — N3010 Interstitial cystitis (chronic) without hematuria: Secondary | ICD-10-CM | POA: Diagnosis not present

## 2019-11-10 DIAGNOSIS — N3090 Cystitis, unspecified without hematuria: Secondary | ICD-10-CM | POA: Diagnosis not present

## 2019-11-10 DIAGNOSIS — M1991 Primary osteoarthritis, unspecified site: Secondary | ICD-10-CM | POA: Diagnosis not present

## 2019-11-17 ENCOUNTER — Other Ambulatory Visit: Payer: Self-pay

## 2019-11-17 ENCOUNTER — Ambulatory Visit (HOSPITAL_COMMUNITY)
Admission: RE | Admit: 2019-11-17 | Discharge: 2019-11-17 | Disposition: A | Discharge status: Home / Self Care | Payer: Medicare HMO | Source: Ambulatory Visit | Attending: Urology | Admitting: Urology

## 2019-11-17 ENCOUNTER — Ambulatory Visit: Payer: Medicare HMO | Admitting: Urology

## 2019-11-17 ENCOUNTER — Other Ambulatory Visit (HOSPITAL_COMMUNITY)
Admission: RE | Admit: 2019-11-17 | Discharge: 2019-11-17 | Disposition: A | Discharge status: Home / Self Care | Payer: Medicare HMO | Source: Other Acute Inpatient Hospital | Attending: Urology | Admitting: Urology

## 2019-11-17 VITALS — BP 105/66 | HR 76 | Temp 97.1°F | Ht 63.0 in | Wt 165.0 lb

## 2019-11-17 DIAGNOSIS — N3010 Interstitial cystitis (chronic) without hematuria: Secondary | ICD-10-CM

## 2019-11-17 DIAGNOSIS — N200 Calculus of kidney: Secondary | ICD-10-CM

## 2019-11-17 DIAGNOSIS — Z87440 Personal history of urinary (tract) infections: Secondary | ICD-10-CM

## 2019-11-17 DIAGNOSIS — N3281 Overactive bladder: Secondary | ICD-10-CM | POA: Diagnosis not present

## 2019-11-17 LAB — POCT URINALYSIS DIPSTICK
Bilirubin, UA: NEGATIVE
Glucose, UA: NEGATIVE
Ketones, UA: NEGATIVE
Nitrite, UA: POSITIVE
Protein, UA: NEGATIVE
Spec Grav, UA: <=1.005 — AB (ref 1.010–1.025)
Urobilinogen, UA: 0.2 E.U./dL
pH, UA: 6 (ref 5.0–8.0)

## 2019-11-17 LAB — URINALYSIS, ROUTINE W REFLEX MICROSCOPIC
Bilirubin Urine: NEGATIVE
Glucose, UA: NEGATIVE mg/dL
Ketones, ur: NEGATIVE mg/dL
Nitrite: POSITIVE — AB
Protein, ur: NEGATIVE mg/dL
Specific Gravity, Urine: 1.004 — ABNORMAL LOW (ref 1.005–1.030)
pH: 6 (ref 5.0–8.0)

## 2019-11-17 MED ORDER — PHENAZOPYRIDINE HCL 200 MG PO TABS: 200.0000 mg | ORAL_TABLET | Freq: Three times a day (TID) | ORAL | 3 refills | Status: DC | PRN

## 2019-11-17 MED ORDER — GEMTESA 75 MG PO TABS: 75.0000 mg | ORAL_TABLET | Freq: Every day | ORAL | 0 refills | Status: DC

## 2019-11-17 NOTE — Progress Notes (Signed)
Urological Symptom Review  Patient is experiencing the following symptoms: Frequent urination Hard to postpone urination Burning/pain with urination Get up at night to urinate Leakage of urine Urinary tract infection  Kidney stones   Review of Systems  Gastrointestinal (upper)  : acid reflux  Gastrointestinal (lower) : Negative for lower GI symptoms  Constitutional : Negative for symptoms  Skin: Negative for symptoms  Eyes: Negative for eye symptoms  Ear/Nose/Throat : Negative for Ear/Nose/Throat symptoms  Hematologic/Lymphatic: Negative for Hematologic/Lymphatic symptoms  Cardiovascular : Negative for cardiovascular symptoms  Respiratory : Negative for respiratory symptoms  Endocrine: Negative for endocrine symptoms  Musculoskeletal: Back pain Joint pain  Neurological: Negative for neurological symptoms  Psychologic: Depression Anxiety

## 2019-11-17 NOTE — Progress Notes (Signed)
Subjective:  1. Renal calculus   2. Overactive bladder   3. Chronic interstitial cystitis   4. Personal history of urinary infection      11/17/19: Samay returns today in f/u and reports a 3 month history of increased symptoms.  She has been on 2 round of antibiotics and has 2 days left of a round of keflex but her symptoms have not improved. She had been on macrobid for suppression but that hasn't helped.  She is off of that while on the keflex.   She has frequency 20x daily and nocturia 3-4x.  She has pain with a full bladder that is minimally relieved by voiding.  She doesn't feel she empties well.  She has had no hematuria.  She has no flank pain.  UA today has 3+ LE and trace heme.   Her last culture was in February and was negative.   She has IC but had a capacity of on HOD in 10/19.  She has benefited from the HOD.   She has stones with small stones on KUB about 18 months ago.        ROS:  ROS:  A complete review of systems was performed.  All systems are negative except for pertinent findings as noted.   ROS  Allergies  Allergen Reactions  . Hydromorphone Itching  . Prednisone Other (See Comments)    Increase anxiety symptoms, insomnia  . Tramadol Hcl Other (See Comments)    dizziness  . Sulfa Antibiotics Rash    Outpatient Encounter Medications as of 11/17/2019  Medication Sig  . albuterol (VENTOLIN HFA) 108 (90 Base) MCG/ACT inhaler SMARTSIG:1-2 Puff(s) Via Inhaler Every 4 Hours PRN  . ALPRAZolam (XANAX) 0.5 MG tablet Take 0.5 mg by mouth 3 (three) times daily as needed.  . cephALEXin (KEFLEX) 500 MG capsule Take 500 mg by mouth 4 (four) times daily.  . COSENTYX, 300 MG DOSE, 150 MG/ML SOSY   . dicyclomine (BENTYL) 10 MG capsule Take 10 mg by mouth 3 (three) times daily.  . DULoxetine (CYMBALTA) 60 MG capsule Take 60 mg by mouth daily.  Marland Kitchen estradiol (ESTRACE) 1 MG tablet TAKE 1 TABLET(1 MG) BY MOUTH DAILY  . HYDROcodone-acetaminophen (NORCO) 10-325 MG tablet  Take 1 tablet by mouth 4 (four) times daily as needed.  . nitrofurantoin, macrocrystal-monohydrate, (MACROBID) 100 MG capsule Take 100 mg by mouth at bedtime.  . potassium chloride SA (K-DUR,KLOR-CON) 20 MEQ tablet Take 40 mEq by mouth daily.   . Probiotic Product (ALIGN PO) Take 1 capsule by mouth daily.   . progesterone (PROMETRIUM) 200 MG capsule TAKE 1 CAPSULE(200 MG) BY MOUTH DAILY  . progesterone (PROMETRIUM) 200 MG capsule Take 200 mg by mouth at bedtime.  . triamterene-hydrochlorothiazide (DYAZIDE) 37.5-25 MG capsule Take 1 capsule by mouth daily.   . calcium carbonate (TUMS - DOSED IN MG ELEMENTAL CALCIUM) 500 MG chewable tablet Chew 2-3 tablets by mouth daily as needed for indigestion or heartburn.  . phenazopyridine (PYRIDIUM) 200 MG tablet Take 1 tablet (200 mg total) by mouth 3 (three) times daily as needed for pain.  . Vibegron (GEMTESA) 75 MG TABS Take 75 mg by mouth daily.  . [DISCONTINUED] Adalimumab (HUMIRA PEN) 40 MG/0.4ML PNKT Inject 40 mg into the skin every 14 (fourteen) days. Twice a month   . [DISCONTINUED] ALPRAZolam (XANAX) 1 MG tablet Take 0.5 mg by mouth at bedtime as needed for anxiety.   . [DISCONTINUED] pantoprazole (PROTONIX) 40 MG tablet Take 1 tablet (40 mg  total) by mouth daily before breakfast.  . [DISCONTINUED] phenazopyridine (PYRIDIUM) 200 MG tablet Take 1 tablet (200 mg total) by mouth 3 (three) times daily. (Patient not taking: Reported on 11/17/2019)  . [DISCONTINUED] Secukinumab, 300 MG Dose, (COSENTYX SENSOREADY, 300 MG,) 150 MG/ML SOAJ Inject into the skin every 30 (thirty) days.   No facility-administered encounter medications on file as of 11/17/2019.    Past Medical History:  Diagnosis Date  . Allergic rhinitis   . Anxiety   . Arthritis   . Bladder pain   . Complication of anesthesia   . Costochondritis    hx  . DDD (degenerative disc disease), lumbar   . Depression   . GERD (gastroesophageal reflux disease)   . Hepatic hemangioma 2006    stable  . History of adenomatous polyp of colon   . History of colitis   . History of gastritis   . History of kidney stones   . HTN (hypertension)   . Irritable bowel syndrome (IBS)   . Medullary sponge kidney    left  . Migraine   . Nephrolithiasis    bilateral-- nonobstructive per CT  . PONV (postoperative nausea and vomiting)   . Psoriatic arthritis (HCC)   . Psoriatic arthritis (HCC)   . Spinal stenosis   . Urgency of urination     Past Surgical History:  Procedure Laterality Date  . ANTERIOR CERVICAL DECOMP/DISCECTOMY FUSION  02/26/2014   C5 - C6; fusion with plate  . BIOPSY  01/04/2019   Procedure: BIOPSY;  Surgeon: Malissa Hippo, MD;  [facility];  Service: Endoscopy;;  gastric polyps  . CARPAL TUNNEL RELEASE Bilateral right 04-01-2010/  left 05-30-2010  . CHOLECYSTECTOMY N/A 07/14/2013   Procedure: LAPAROSCOPIC CHOLECYSTECTOMY;  Surgeon: Dalia Heading, MD;  Location: [facility];  Service: General;  Laterality: N/A;  . COLONOSCOPY  10-13-2010  . COLONOSCOPY N/A 01/04/2019   Procedure: COLONOSCOPY;  Surgeon: Malissa Hippo, MD;  [facility];  Service: Endoscopy;  Laterality: N/A;  . CYSTO WITH HYDRODISTENSION N/A 04/02/2015   Procedure: CYSTOSCOPY/HYDRODISTENSION, BLADDER BIOPSY WITH FULGURATION;  Surgeon: Bjorn Pippin, MD;  Location: [facility];  Service: Urology;  Laterality: N/A;  . CYSTO WITH HYDRODISTENSION N/A 03/25/2018   Procedure: CYSTOSCOPY/HYDRODISTENSION, INSTILL MARCAIN AND PYRIDIUM;  Surgeon: Bjorn Pippin, MD;  Location: [facility];  Service: Urology;  Laterality: N/A;  . CYSTOSCOPY W/ URETERAL STENT PLACEMENT Bilateral 04/08/2016   Procedure: CYSTOSCOPY WITH BILATERAL RETROGRADE PYELOGRAM, BILATERAL URETERAL STENT PLACEMENT;  Surgeon: Malen Gauze, MD;  Location: [facility];  Service: Urology;  Laterality: Bilateral;  . CYSTOSCOPY W/ URETERAL STENT PLACEMENT Left 04/15/2016   Procedure: CYSTOSCOPY WITH RETROGRADE  PYELOGRAM/URETERAL STENT PLACEMENT;  Surgeon: Malen Gauze, MD;  Location: [facility];  Service: Urology;  Laterality: Left;  . CYSTOSCOPY WITH HOLMIUM LASER LITHOTRIPSY Right 04/08/2016   Procedure: CYSTOSCOPY WITH RIGHT RENAL STONE EXTRACTION WITH HOLMIUM LASER LITHOTRIPSY;  Surgeon: Malen Gauze, MD;  [facility]: [facility];  Service: Urology;  Laterality: Right;  . CYSTOSCOPY WITH HOLMIUM LASER LITHOTRIPSY Left 04/15/2016   Procedure: CYSTOSCOPY WITH HOLMIUM LASER LITHOTRIPSY;  Surgeon: Malen Gauze, MD;  [facility]: [facility];  Service: Urology;  Laterality: Left;  . CYSTOSCOPY WITH RETROGRADE PYELOGRAM, URETEROSCOPY AND STENT PLACEMENT Left 07/05/2017   Procedure: CYSTOSCOPY WITH RETROGRADE PYELOGRAM, URETEROSCOPY AND STENT PLACEMENT;  Surgeon: Malen Gauze, MD;  [facility]: [facility];  Service: Urology;  Laterality: Left;  . DIAGNOSTIC LAPAROSCOPY    . DILATION  AND CURETTAGE OF UTERUS    . DILITATION & CURRETTAGE/HYSTROSCOPY WITH THERMACHOICE ABLATION N/A 09/07/2012   Procedure: DILATATION & CURETTAGE/HYSTEROSCOPY WITH THERMACHOICE ABLATION;  Surgeon: Lazaro Arms, MD;  [facility]: [facility];  Service: Gynecology;  Laterality: N/A;  . ESOPHAGOGASTRODUODENOSCOPY  10/14/10  . ESOPHAGOGASTRODUODENOSCOPY N/A 01/04/2019   Procedure: ESOPHAGOGASTRODUODENOSCOPY (EGD);  Surgeon: Malissa Hippo, MD;  [facility];  Service: Endoscopy;  Laterality: N/A;  9:30  . EXTRACORPOREAL SHOCK WAVE LITHOTRIPSY  multiple  . HOLMIUM LASER APPLICATION Left 07/05/2017   Procedure: HOLMIUM LASER APPLICATION;  Surgeon: Malen Gauze, MD;  Location: [facility];  Service: Urology;  Laterality: Left;  . PERCUTANEOUS NEPHROSTOLITHOTOMY  2003  . PLANTAR FASCIA SURGERY Bilateral right 2008//  left 2010  . POLYPECTOMY  01/04/2019   Procedure: POLYPECTOMY;  Surgeon: Malissa Hippo, MD;  [facility];  Service: Endoscopy;;  colon  . STONE EXTRACTION WITH BASKET Left 04/15/2016   Procedure: STONE  EXTRACTION WITH BASKET;  Surgeon: Malen Gauze, MD;  Location: [facility];  Service: Urology;  Laterality: Left;    Social History   Socioeconomic History  . Marital status: Divorced    Spouse name: Not on file  . Number of children: 0  . Years of education: Not on file  . Highest education level: Not on file  Occupational History  . Occupation: ORDER PROCESSER    Employer: [employer]  Tobacco Use  . Smoking status: Never Smoker  . Smokeless tobacco: Never Used  Substance and Sexual Activity  . Alcohol use: No  . Drug use: No  . Sexual activity: Yes    Birth control/protection: Surgical    Comment: ablation  Other Topics Concern  . Not on file  Social History Narrative  . Not on file   Social Determinants of Health   Financial Resource Strain:   . Difficulty of Paying Living Expenses:   Food Insecurity:   . Worried About Programme researcher, broadcasting/film/video in the Last Year:   . Barista in the Last Year:   Transportation Needs:   . Freight forwarder (Medical):   Marland Kitchen Lack of Transportation (Non-Medical):   Physical Activity:   . Days of Exercise per Week:   . Minutes of Exercise per Session:   Stress:   . Feeling of Stress :   Social Connections:   . Frequency of Communication with Friends and Family:   . Frequency of Social Gatherings with Friends and Family:   . Attends Religious Services:   . Active Member of Clubs or Organizations:   . Attends Banker Meetings:   Marland Kitchen Marital Status:   Intimate Partner Violence:   . Fear of Current or Ex-Partner:   . Emotionally Abused:   Marland Kitchen Physically Abused:   . Sexually Abused:     Family History  Problem Relation Age of Onset  . Irritable bowel syndrome Mother   . Scleroderma Mother   . Rheum arthritis Mother   . Asthma Mother   . Melanoma Father   . Cancer Father        Melanoma  . Colon cancer Cousin 29       second cousin Nettie Elm [place])  . Cancer Maternal Grandmother        Ovarian  .  Diabetes Maternal Grandmother   . Thyroid disease Maternal Aunt        Objective: Vitals:   11/17/19 1418  BP: 105/66  Pulse: 76  Temp: Marland Kitchen)  97.1 F (36.2 C)     Physical Exam  Lab Results:    UA reviewed.   Culture from 2/21 reviewed.    Studies/Results: No results found.    Assessment & Plan: Painful bladder with frequency and nocturia.   I am going to give her pyridium pending a culture.   I discussed elavil, hydroxyzine, Elmiron or repeat HOD.   She would like to avoid the procedure at this time.  OAB.  I am going to try her on Gemtesa and gave her samples.  History of UTI's.  She has persistent pyuria on keflex.  Urine culture sent.  I will get recent culture results from Dr. Sherwood Gambler.  Urolithiasis.  I will get a KUB.       Meds ordered this encounter  Medications  . phenazopyridine (PYRIDIUM) 200 MG tablet    Sig: Take 1 tablet (200 mg total) by mouth 3 (three) times daily as needed for pain.    Dispense:  30 tablet    Refill:  3  . Vibegron (GEMTESA) 75 MG TABS    Sig: Take 75 mg by mouth daily.    Dispense:  28 tablet    Refill:  0    Samples given.  Lot [id number] Jan 2023     Orders Placed This Encounter  Procedures  . Urine Culture    Standing Status:   Future    Standing Expiration Date:   01/17/2020  . DG Abd 1 View    Standing Status:   Future    Standing Expiration Date:   01/17/2020    Order Specific Question:   Reason for Exam (SYMPTOM  OR DIAGNOSIS REQUIRED)    Answer:   renal stones    Order Specific Question:   Preferred imaging location?    Answer:   [facility]    Order Specific Question:   Radiology Contrast Protocol - do NOT remove file path    Answer:   [email or web address]    Order Specific Question:   Is patient pregnant?    Answer:   No  . Urinalysis, Routine w reflex microscopic    Standing Status:   Future    Standing Expiration Date:   01/17/2020  . POCT urinalysis dipstick       Return in about 6 weeks (around 12/29/2019).   CC: Elfredia Nevins, MD      Bjorn Pippin 11/17/2019 Patient ID: [name], female   DOB: 1971-01-07, 49 y.o.   MRN: [id number]

## 2019-11-19 LAB — URINE CULTURE: Culture: NO GROWTH

## 2019-11-26 ENCOUNTER — Other Ambulatory Visit: Payer: Self-pay | Admitting: Adult Health

## 2019-11-30 ENCOUNTER — Telehealth: Payer: Self-pay

## 2019-12-08 ENCOUNTER — Ambulatory Visit (INDEPENDENT_AMBULATORY_CARE_PROVIDER_SITE_OTHER): Payer: Medicare HMO | Admitting: Urology

## 2019-12-08 ENCOUNTER — Other Ambulatory Visit: Payer: Self-pay

## 2019-12-08 VITALS — BP 127/74 | HR 88 | Temp 98.4°F | Ht 63.0 in | Wt 170.0 lb

## 2019-12-08 DIAGNOSIS — R300 Dysuria: Secondary | ICD-10-CM | POA: Diagnosis not present

## 2019-12-08 DIAGNOSIS — N3010 Interstitial cystitis (chronic) without hematuria: Secondary | ICD-10-CM

## 2019-12-08 DIAGNOSIS — N200 Calculus of kidney: Secondary | ICD-10-CM

## 2019-12-08 DIAGNOSIS — Z87440 Personal history of urinary (tract) infections: Secondary | ICD-10-CM

## 2019-12-08 DIAGNOSIS — R102 Pelvic and perineal pain: Secondary | ICD-10-CM

## 2019-12-08 LAB — POCT URINALYSIS DIPSTICK
Bilirubin, UA: NEGATIVE
Blood, UA: NEGATIVE
Glucose, UA: NEGATIVE
Ketones, UA: NEGATIVE
Nitrite, UA: POSITIVE
Protein, UA: NEGATIVE
Spec Grav, UA: <=1.005 — AB (ref 1.010–1.025)
Urobilinogen, UA: 0.2 E.U./dL
pH, UA: 6 (ref 5.0–8.0)

## 2019-12-08 MED ORDER — LIDOCAINE HCL 2 % IJ SOLN
10.0000 mL | Freq: Once | INTRAMUSCULAR | Status: AC
  Administered 2019-12-08: 200 mg

## 2019-12-08 MED ORDER — SODIUM BICARBONATE 8.4 % IV SOLN
50.0000 meq | Freq: Once | INTRAVENOUS | Status: AC
  Administered 2019-12-08: 50 meq

## 2019-12-08 MED ORDER — DIMETHYL SULFOXIDE 50 % IS SOLN
50.0000 mL | Freq: Once | INTRAVESICAL | Status: AC
  Administered 2019-12-08: 50 mL via URETHRAL

## 2019-12-08 NOTE — Progress Notes (Signed)
Subjective:  1. Personal history of urinary infection   2. Renal stones   3. Chronic interstitial cystitis   4. Dysuria   5. Pelvic pain in female      12/08/19:  Lataysha returns today in f/u.  He culture from 6/4 was negative.  She had a KUB that shows small bilateral renal stones but no ureteral stones.  Her UA is unremarkable today.  She was given Gemteza samples at her last visit ofr her OAB symptoms.  She feels like the frequency decreased but the bladder pain and burning isn't better.   She has suprapubic pain with burning and constant urgency.  She had a prior HOD in 10/19 with benefit.  She remains on pyridium and her UA is nit+ as a result.   11/17/19: Daphney returns today in f/u and reports a 3 month history of increased symptoms.  She has been on 2 round of antibiotics and has 2 days left of a round of keflex but her symptoms have not improved. She had been on macrobid for suppression but that hasn't helped.  She is off of that while on the keflex.   She has frequency 20x daily and nocturia 3-4x.  She has pain with a full bladder that is minimally relieved by voiding.  She doesn't feel she empties well.  She has had no hematuria.  She has no flank pain.  UA today has 3+ LE and trace heme.   Her last culture was in February and was negative.   She has IC but had a capacity of on HOD in 10/19.  She has benefited from the HOD.   She has stones with small stones on KUB about 18 months ago.        ROS:  ROS:  A complete review of systems was performed.  All systems are negative except for pertinent findings as noted.   ROS  Allergies  Allergen Reactions  . Hydromorphone Itching  . Prednisone Other (See Comments)    Increase anxiety symptoms, insomnia  . Tramadol Hcl Other (See Comments)    dizziness  . Sulfa Antibiotics Rash    Outpatient Encounter Medications as of 12/08/2019  Medication Sig  . ALPRAZolam (XANAX) 0.5 MG tablet Take 0.5 mg by mouth 3 (three) times  daily as needed.  . calcium carbonate (TUMS - DOSED IN MG ELEMENTAL CALCIUM) 500 MG chewable tablet Chew 2-3 tablets by mouth daily as needed for indigestion or heartburn.  . DULoxetine (CYMBALTA) 60 MG capsule Take 60 mg by mouth daily.  Marland Kitchen estradiol (ESTRACE) 1 MG tablet TAKE 1 TABLET(1 MG) BY MOUTH DAILY  . HYDROcodone-acetaminophen (NORCO) 10-325 MG tablet Take 1 tablet by mouth 4 (four) times daily as needed.  . nitrofurantoin, macrocrystal-monohydrate, (MACROBID) 100 MG capsule Take 100 mg by mouth at bedtime.  . phenazopyridine (PYRIDIUM) 200 MG tablet Take 1 tablet (200 mg total) by mouth 3 (three) times daily as needed for pain.  . potassium chloride SA (K-DUR,KLOR-CON) 20 MEQ tablet Take 40 mEq by mouth daily.   . Probiotic Product (ALIGN PO) Take 1 capsule by mouth daily.   . progesterone (PROMETRIUM) 200 MG capsule TAKE 1 CAPSULE(200 MG) BY MOUTH DAILY  . triamterene-hydrochlorothiazide (DYAZIDE) 37.5-25 MG capsule Take 1 capsule by mouth daily.   . Vibegron (GEMTESA) 75 MG TABS Take 75 mg by mouth daily.  . [DISCONTINUED] Adalimumab (HUMIRA PEN) 40 MG/0.4ML PNKT Inject 40 mg into the skin every 14 (fourteen) days. Twice a month   . [  DISCONTINUED] albuterol (VENTOLIN HFA) 108 (90 Base) MCG/ACT inhaler SMARTSIG:1-2 Puff(s) Via Inhaler Every 4 Hours PRN (Patient not taking: Reported on 12/08/2019)  . [DISCONTINUED] cephALEXin (KEFLEX) 500 MG capsule Take 500 mg by mouth 4 (four) times daily. (Patient not taking: Reported on 12/08/2019)  . [DISCONTINUED] COSENTYX, 300 MG DOSE, 150 MG/ML SOSY   . [DISCONTINUED] dicyclomine (BENTYL) 10 MG capsule Take 10 mg by mouth 3 (three) times daily. (Patient not taking: Reported on 12/08/2019)  . [DISCONTINUED] pantoprazole (PROTONIX) 40 MG tablet Take 1 tablet (40 mg total) by mouth daily before breakfast.   No facility-administered encounter medications on file as of 12/08/2019.    Past Medical History:  Diagnosis Date  . Allergic rhinitis   .  Anxiety   . Arthritis   . Bladder pain   . Complication of anesthesia   . Costochondritis    hx  . DDD (degenerative disc disease), lumbar   . Depression   . GERD (gastroesophageal reflux disease)   . Hepatic hemangioma 2006   stable  . History of adenomatous polyp of colon   . History of colitis   . History of gastritis   . History of kidney stones   . HTN (hypertension)   . Irritable bowel syndrome (IBS)   . Medullary sponge kidney    left  . Migraine   . Nephrolithiasis    bilateral-- nonobstructive per CT  . PONV (postoperative nausea and vomiting)   . Psoriatic arthritis (HCC)   . Psoriatic arthritis (HCC)   . Spinal stenosis   . Urgency of urination     Past Surgical History:  Procedure Laterality Date  . ANTERIOR CERVICAL DECOMP/DISCECTOMY FUSION  02/26/2014   C5 - C6; fusion with plate  . BIOPSY  01/04/2019   Procedure: BIOPSY;  Surgeon: Malissa Hippo, MD;  [facility];  Service: Endoscopy;;  gastric polyps  . CARPAL TUNNEL RELEASE Bilateral right 04-01-2010/  left 05-30-2010  . CHOLECYSTECTOMY N/A 07/14/2013   Procedure: LAPAROSCOPIC CHOLECYSTECTOMY;  Surgeon: Dalia Heading, MD;  Location: [facility];  Service: General;  Laterality: N/A;  . COLONOSCOPY  10-13-2010  . COLONOSCOPY N/A 01/04/2019   Procedure: COLONOSCOPY;  Surgeon: Malissa Hippo, MD;  [facility];  Service: Endoscopy;  Laterality: N/A;  . CYSTO WITH HYDRODISTENSION N/A 04/02/2015   Procedure: CYSTOSCOPY/HYDRODISTENSION, BLADDER BIOPSY WITH FULGURATION;  Surgeon: Bjorn Pippin, MD;  Location: [facility] ;  Service: Urology;  Laterality: N/A;  . CYSTO WITH HYDRODISTENSION N/A 03/25/2018   Procedure: CYSTOSCOPY/HYDRODISTENSION, INSTILL MARCAIN AND PYRIDIUM;  Surgeon: Bjorn Pippin, MD;  Location: [facility];  Service: Urology;  Laterality: N/A;  . CYSTOSCOPY W/ URETERAL STENT PLACEMENT Bilateral 04/08/2016   Procedure: CYSTOSCOPY WITH BILATERAL RETROGRADE PYELOGRAM,  BILATERAL URETERAL STENT PLACEMENT;  Surgeon: Malen Gauze, MD;  [facility];  Service: Urology;  Laterality: Bilateral;  . CYSTOSCOPY W/ URETERAL STENT PLACEMENT Left 04/15/2016   Procedure: CYSTOSCOPY WITH RETROGRADE PYELOGRAM/URETERAL STENT PLACEMENT;  Surgeon: Malen Gauze, MD;  [facility];  Service: Urology;  Laterality: Left;  . CYSTOSCOPY WITH HOLMIUM LASER LITHOTRIPSY Right 04/08/2016   Procedure: CYSTOSCOPY WITH RIGHT RENAL STONE EXTRACTION WITH HOLMIUM LASER LITHOTRIPSY;  Surgeon: Malen Gauze, MD;  [facility];  Service: Urology;  Laterality: Right;  . CYSTOSCOPY WITH HOLMIUM LASER LITHOTRIPSY Left 04/15/2016   Procedure: CYSTOSCOPY WITH HOLMIUM LASER LITHOTRIPSY;  Surgeon: Malen Gauze, MD;  [facility];  Service: Urology;  Laterality: Left;  . CYSTOSCOPY  WITH RETROGRADE PYELOGRAM, URETEROSCOPY AND STENT PLACEMENT Left 07/05/2017   Procedure: CYSTOSCOPY WITH RETROGRADE PYELOGRAM, URETEROSCOPY AND STENT PLACEMENT;  Surgeon: Malen Gauze, MD;  [facility];  Service: Urology;  Laterality: Left;  . DIAGNOSTIC LAPAROSCOPY    . DILATION AND CURETTAGE OF UTERUS    . DILITATION & CURRETTAGE/HYSTROSCOPY WITH THERMACHOICE ABLATION N/A 09/07/2012   Procedure: DILATATION & CURETTAGE/HYSTEROSCOPY WITH THERMACHOICE ABLATION;  Surgeon: Lazaro Arms, MD;  [facility];  Service: Gynecology;  Laterality: N/A;  . ESOPHAGOGASTRODUODENOSCOPY  10/14/10  . ESOPHAGOGASTRODUODENOSCOPY N/A 01/04/2019   Procedure: ESOPHAGOGASTRODUODENOSCOPY (EGD);  Surgeon: Malissa Hippo, MD;  [facility];  Service: Endoscopy;  Laterality: N/A;  9:30  . EXTRACORPOREAL SHOCK WAVE LITHOTRIPSY  multiple  . HOLMIUM LASER APPLICATION Left 07/05/2017   Procedure: HOLMIUM LASER APPLICATION;  Surgeon: Malen Gauze, MD;  [facility];  Service: Urology;  Laterality: Left;  . PERCUTANEOUS NEPHROSTOLITHOTOMY  2003  . PLANTAR FASCIA SURGERY Bilateral right  2008//  left 2010  . POLYPECTOMY  01/04/2019   Procedure: POLYPECTOMY;  Surgeon: Malissa Hippo, MD;  [facility];  Service: Endoscopy;;  colon  . STONE EXTRACTION WITH BASKET Left 04/15/2016   Procedure: STONE EXTRACTION WITH BASKET;  Surgeon: Malen Gauze, MD;  Location[facility];  Service: Urology;  Laterality: Left;    Social History   Socioeconomic History  . Marital status: Divorced    Spouse name: Not on file  . Number of children: 0  . Years of education: Not on file  . Highest education level: Not on file  Occupational History  . Occupation: ORDER PROCESSER    Employer: [employer]  Tobacco Use  . Smoking status: Never Smoker  . Smokeless tobacco: Never Used  Vaping Use  . Vaping Use: Never used  Substance and Sexual Activity  . Alcohol use: No  . Drug use: No  . Sexual activity: Yes    Birth control/protection: Surgical    Comment: ablation  Other Topics Concern  . Not on file  Social History Narrative  . Not on file   Social Determinants of Health   Financial Resource Strain:   . Difficulty of Paying Living Expenses:   Food Insecurity:   . Worried About Programme researcher, broadcasting/film/video in the Last Year:   . Barista in the Last Year:   Transportation Needs:   . Freight forwarder (Medical):   Marland Kitchen Lack of Transportation (Non-Medical):   Physical Activity:   . Days of Exercise per Week:   . Minutes of Exercise per Session:   Stress:   . Feeling of Stress :   Social Connections:   . Frequency of Communication with Friends and Family:   . Frequency of Social Gatherings with Friends and Family:   . Attends Religious Services:   . Active Member of Clubs or Organizations:   . Attends Banker Meetings:   Marland Kitchen Marital Status:   Intimate Partner Violence:   . Fear of Current or Ex-Partner:   . Emotionally Abused:   Marland Kitchen Physically Abused:   . Sexually Abused:     Family History  Problem Relation Age of Onset  . Irritable bowel  syndrome Mother   . Scleroderma Mother   . Rheum arthritis Mother   . Asthma Mother   . Melanoma Father   . Cancer Father        Melanoma  . Colon cancer Cousin 36  second cousin Pecola [place])  . Cancer Maternal Grandmother        Ovarian  . Diabetes Maternal Grandmother   . Thyroid disease Maternal Aunt        Objective: Vitals:   12/08/19 1357  BP: 127/74  Pulse: 88  Temp: 98.4 F (36.9 C)     Physical Exam  Lab Results:    UA reviewed.   Culture from 2/21 reviewed.    Studies/Results: KUB reviewed.     Assessment & Plan: Painful bladder with frequency and nocturia.   I discussed additional options including office bladder instillations with [place] and Marcaine or a cysto HOD as an outpatient.  She would like to try the instillations.  OAB.  She had some improvement with Gemtesa.  Additional samples given.   Hx of UTI's.  Her culture was negative on 6/4.  Her UA today is not concerning for infection.   Urolithiasis.  Stable small bilateral stones on KUB prior to this visit.      No orders of the defined types were placed in this encounter.    Orders Placed This Encounter  Procedures  . POCT urinalysis dipstick      Return in about 3 months (around 03/09/2020) for She needs 5 more weekly bladder instillations and then see me in 3 months. .   CC: Elfredia Nevins, MD      Bjorn Pippin 12/08/2019 Patient ID: [name], female   DOB: [birth year], 49 y.o.   MRN: [id number] Patient ID: THUYTIEN SCIORTINO, female   DOB: Dec 16, 1970, 49 y.o.   MRN: [id number]

## 2019-12-08 NOTE — Progress Notes (Signed)
Urological Symptom Review  Patient is experiencing the following symptoms: Frequent urination Hard to postpone urination Burning/pain with urination Get up at night to urinate Leakage of urine   Review of Systems  Gastrointestinal (upper)  : Negative for upper GI symptoms  Gastrointestinal (lower) : Negative for lower GI symptoms  Constitutional : Negative for symptoms  Skin: Negative for skin symptoms  Eyes: Negative for eye symptoms  Ear/Nose/Throat : Negative for Ear/Nose/Throat symptoms  Hematologic/Lymphatic: Negative for Hematologic/Lymphatic symptoms  Cardiovascular : Negative for cardiovascular symptoms  Respiratory : Negative for respiratory symptoms  Endocrine: Negative for endocrine symptoms  Musculoskeletal: Back pain Joint pain  Neurological: Negative for neurological symptoms  Psychologic: Depression Anxiety

## 2019-12-08 NOTE — Progress Notes (Signed)
Bladder Instillation  Due to I.C. patient is present today for a Bladder Instillation of DMSO treatment. Patient was cleaned and prepped in a sterile fashion with betadine and lidocaine 2% jelly was instilled into the urethra.  A 14FR catheter was inserted, urine return was noted 57ml, urine was yellow in color.  DMSO treatment was instilled into the bladder. The catheter was then removed. Patient tolerated well, no complications were noted Patient held in bladder for 30 minutes prior to procedure starting.   Preformed by: Gibson Ramp, RN  Follow up/ Additional notes: nurse visit treatment next week.

## 2019-12-14 NOTE — Telephone Encounter (Signed)
Entered in error

## 2019-12-15 ENCOUNTER — Ambulatory Visit: Payer: Medicare HMO

## 2019-12-19 ENCOUNTER — Other Ambulatory Visit: Payer: Self-pay

## 2019-12-19 ENCOUNTER — Ambulatory Visit (INDEPENDENT_AMBULATORY_CARE_PROVIDER_SITE_OTHER): Payer: Medicare HMO

## 2019-12-19 DIAGNOSIS — N3010 Interstitial cystitis (chronic) without hematuria: Secondary | ICD-10-CM | POA: Diagnosis not present

## 2019-12-19 LAB — POCT URINALYSIS DIPSTICK
Bilirubin, UA: INVALID
Blood, UA: INVALID
Ketones, UA: INVALID
Nitrite, UA: INVALID
Spec Grav, UA: 1.01 (ref 1.010–1.025)

## 2019-12-19 MED ORDER — DIMETHYL SULFOXIDE 50 % IS SOLN
50.0000 mL | Freq: Once | INTRAVESICAL | Status: AC
  Administered 2019-12-19: 50 mL via URETHRAL

## 2019-12-19 MED ORDER — LIDOCAINE HCL 2 % IJ SOLN
10.0000 mL | Freq: Once | INTRAMUSCULAR | Status: AC
  Administered 2019-12-19: 200 mg

## 2019-12-19 MED ORDER — HYDROCORTISONE NA SUCCINATE PF 100 MG IJ SOLR
100.0000 mg | Freq: Once | INTRAMUSCULAR | Status: AC
  Administered 2019-12-19: 100 mg

## 2019-12-19 MED ORDER — SODIUM BICARBONATE 8.4 % IV SOLN
50.0000 meq | Freq: Once | INTRAVENOUS | Status: AC
  Administered 2019-12-19: 50 meq via INTRAVENOUS

## 2019-12-19 NOTE — Progress Notes (Signed)
Bladder Instillation DMSO  Due to IC patient is present today for a Bladder Instillation of DMSO treatment. Patient was cleaned and prepped in a sterile fashion with betadine.  A 14FR catheter was inserted, urine return was noted 82ml, urine was red/orange in color.  DMSO treatment was instilled into the bladder. The catheter was then removed. Patient tolerated well, no complications were noted pt was instructed to hold the instillation for 20 minutes and then will void it out. Pt voiced understanding.    Performed by: Santhosh Gulino, LPN  Follow up/ Additional notes: Keep next schedule NV

## 2019-12-19 NOTE — Patient Instructions (Signed)
IC/BPS Flare Fact Sheet  Interstitial cystitis patients often struggle with "flares," a sudden and dramatic worsening of their bladder symptoms. Lasting from hours to weeks, flares can be unpredictable, disruptive and difficult to manage for newly diagnosed and veteran IC patients. Bladder wall irritation is the most common type of flare, often occurring after patients eat acidic foods, are under high stress or struggling with hormone changes. Pelvic floor muscle tension can also drive flares, particularly when those muscles are provoked through riding in a car or sexual intimacy. The good news is that flares are generally short term. They do, however, require prompt intervention to reduce/avoid the trigger and minimize the duration of the flare.  Typical Flare Triggers  DIET - 95% of patients report that certain foods exacerbate their IC symptoms. The most common offenders are foods high in acid, caffeine and alcohol.  DRIVING - 03% of IC patients report pain and discomfort while riding in a car, train, airplane or on a motorcycle. Patients are encouraged to avoid long rides until their condition has improved.  STRESS & ANXIETY - High periods of physical or emotional stress can exacerbate symptoms, including periods of intense cold, heat and emotional distress.  SEX & INTIMACY - Men with IC may experience pain at the moment of orgasm while women often feel their worst 24-48 hours after intercourse, the result of pelvic floor muscle spasms.  EXERCISE - Exercise that jars or puts pressure on the pelvic floor (i.e. riding a bicycle or motorcycle, spinning, running or stairs) can exacerbate pelvic pain and bladder symptoms. Exercises that keep the hips level are ideal, such as: walking, elliptical, rowing, gentle yoga and/or pilates.  HORMONES - Many women struggle with short term flares during ovulation and a few days before their period. Ironically, some patients flare when  their progesterone levels are higher, while others flare when their estrogen levels are higher. Post-menopausal women may experience more bladder, urethral and vulvar sensitivity due to dryness of the skin. Using a compounded, preservative-free estrogen cream may help patients with estrogen atrophy.  CHEMICAL EXPOSURE - Chemical exposures can trigger an IC flare, including: scented candles, room fresheners, cleansers, paints, solvents and pest control products. Patients with sensitive skin report that scented laundry detergent, fabric softeners and/or dryer sheets can trigger urethral, vulvar or rectal discomfort.  [facility] offers more detailed information on flares and hour by hour rescue plans that can help reduce discomfort.

## 2019-12-22 ENCOUNTER — Ambulatory Visit: Payer: Medicare HMO

## 2019-12-25 ENCOUNTER — Ambulatory Visit (INDEPENDENT_AMBULATORY_CARE_PROVIDER_SITE_OTHER): Payer: Medicare HMO

## 2019-12-25 ENCOUNTER — Other Ambulatory Visit: Payer: Self-pay

## 2019-12-25 DIAGNOSIS — N3010 Interstitial cystitis (chronic) without hematuria: Secondary | ICD-10-CM | POA: Diagnosis not present

## 2019-12-25 LAB — POCT URINALYSIS DIPSTICK
Bilirubin, UA: NEGATIVE
Glucose, UA: NEGATIVE
Ketones, UA: NEGATIVE
Nitrite, UA: NEGATIVE
Protein, UA: POSITIVE — AB
Spec Grav, UA: 1.015 (ref 1.010–1.025)
Urobilinogen, UA: 0.2 E.U./dL
pH, UA: 6 (ref 5.0–8.0)

## 2019-12-25 MED ORDER — SODIUM BICARBONATE 8.4 % IV SOLN
50.0000 meq | Freq: Once | INTRAVENOUS | Status: AC
  Administered 2019-12-25: 50 meq via INTRAVENOUS

## 2019-12-25 MED ORDER — DIMETHYL SULFOXIDE 50 % IS SOLN
50.0000 mL | Freq: Once | INTRAVESICAL | Status: AC
  Administered 2019-12-25: 50 mL via URETHRAL

## 2019-12-25 MED ORDER — LIDOCAINE HCL 1 % IJ SOLN
20.0000 mL | Freq: Once | INTRAMUSCULAR | Status: AC
  Administered 2019-12-25: 20 mL

## 2019-12-25 NOTE — Progress Notes (Signed)
Bladder Instillation  Due to IC patient is present today for a Bladder Instillation of RIMSO. Patient was cleaned and prepped in a sterile fashion with betadine and lidocaine 2% jelly was instilled into the urethra.  A 14FR catheter was inserted, urine return was noted 36ml, urine was yellow in color.  50 ml was instilled into the bladder. The catheter was then removed. Patient tolerated well, no complications were noted Patient held in bladder for 20 minutes.  Performed by: Cody Albus,LPN  Follow up/ Additional notes: Keep scheduled NV appt

## 2019-12-28 ENCOUNTER — Telehealth: Payer: Self-pay | Admitting: Urology

## 2019-12-28 NOTE — Telephone Encounter (Signed)
Called back pt . Left message to return call.

## 2019-12-28 NOTE — Telephone Encounter (Signed)
Pt called c/o of spasms. She asked that a nurse call her back regarding this issue.

## 2019-12-28 NOTE — Telephone Encounter (Signed)
Pt reports she has done 3 bladder DMSO treatments and symptoms are unchanged. Asking if you would still consider moving forward with the surgeryintervention for her. I still have the posting sheet you filled out for her.

## 2019-12-28 NOTE — Telephone Encounter (Signed)
If she would like to proceed with hydrodistention, go ahead and get that set up.  I could do it or if Dr. Alyson Ingles has time and the patient is willing, he could do it.      Thanks.

## 2019-12-29 ENCOUNTER — Ambulatory Visit: Payer: Medicare HMO | Admitting: Urology

## 2019-12-29 ENCOUNTER — Ambulatory Visit: Payer: Medicare HMO

## 2019-12-29 NOTE — Telephone Encounter (Signed)
I spoke with pt this am. Wishes to move forward with surgery. Pt does not feel like bladder installations in the office are helping. Surgery date confirmed with pt for Dr. Alyson Ingles to complete.

## 2020-01-01 ENCOUNTER — Ambulatory Visit: Payer: Medicare HMO

## 2020-01-05 ENCOUNTER — Ambulatory Visit: Payer: Medicare HMO

## 2020-01-08 ENCOUNTER — Ambulatory Visit: Payer: Medicare HMO

## 2020-01-08 NOTE — Patient Instructions (Signed)
Your procedure is scheduled on: 01/15/2020  Report to [facility] at  11:30   AM.  Call this number if you have problems the morning of surgery: [phone]   Remember:   Do not Eat or Drink after midnight         No Smoking the morning of surgery  :  Take these medicines the morning of surgery with A SIP OF WATER: Cymbalta, xanax, vibegron, and pyridium if needed   Do not wear jewelry, make-up or nail polish.  Do not wear lotions, powders, or perfumes. You may wear deodorant.  Do not shave 48 hours prior to surgery. Men may shave face and neck.  Do not bring valuables to the hospital.  Contacts, dentures or bridgework may not be worn into surgery.  Leave suitcase in the car. After surgery it may be brought to your room.  For patients admitted to the hospital, checkout time is 11:00 AM the day of discharge.   Patients discharged the day of surgery will not be allowed to drive home.     Cystoscopy Cystoscopy is a procedure that is used to help diagnose and sometimes treat conditions that affect the lower urinary tract. The lower urinary tract includes the bladder and the urethra. The urethra is the tube that drains urine from the bladder. Cystoscopy is done using a thin, tube-shaped instrument with a light and camera at the end (cystoscope). The cystoscope may be hard or flexible, depending on the goal of the procedure. The cystoscope is inserted through the urethra, into the bladder. Cystoscopy may be recommended if you have:  Urinary tract infections that keep coming back.  Blood in the urine (hematuria).  An inability to control when you urinate (urinary incontinence) or an overactive bladder.  Unusual cells found in a urine sample.  A blockage in the urethra, such as a urinary stone.  Painful urination.  An abnormality in the bladder found during an intravenous pyelogram (IVP) or CT scan. Cystoscopy may also be done to remove a sample of tissue to be examined under a  microscope (biopsy). Tell a health care provider about:  Any allergies you have.  All medicines you are taking, including vitamins, herbs, eye drops, creams, and over-the-counter medicines.  Any problems you or family members have had with anesthetic medicines.  Any blood disorders you have.  Any surgeries you have had.  Any medical conditions you have.  Whether you are pregnant or may be pregnant. What are the risks? Generally, this is a safe procedure. However, problems may occur, including:  Infection.  Bleeding.  Allergic reactions to medicines.  Damage to other structures or organs. What happens before the procedure?  Ask your health care provider about: ? Changing or stopping your regular medicines. This is especially important if you are taking diabetes medicines or blood thinners. ? Taking medicines such as aspirin and ibuprofen. These medicines can thin your blood. Do not take these medicines unless your health care provider tells you to take them. ? Taking over-the-counter medicines, vitamins, herbs, and supplements.  Follow instructions from your health care provider about eating or drinking restrictions.  Ask your health care provider what steps will be taken to help prevent infection. These may include: ? Washing skin with a germ-killing soap. ? Taking antibiotic medicine.  You may have an exam or testing, such as: ? X-rays of the bladder, urethra, or kidneys. ? Urine tests to check for signs of infection.  Plan to have someone take you  home from the hospital or clinic. What happens during the procedure?   You will be given one or more of the following: ? A medicine to help you relax (sedative). ? A medicine to numb the area (local anesthetic).  The area around the opening of your urethra will be cleaned.  The cystoscope will be passed through your urethra into your bladder.  Germ-free (sterile) fluid will flow through the cystoscope to fill your  bladder. The fluid will stretch your bladder so that your health care provider can clearly examine your bladder walls.  Your doctor will look at the urethra and bladder. Your doctor may take a biopsy or remove stones.  The cystoscope will be removed, and your bladder will be emptied. The procedure may vary among health care providers and hospitals. What can I expect after the procedure? After the procedure, it is common to have:  Some soreness or pain in your abdomen and urethra.  Urinary symptoms. These include: ? Mild pain or burning when you urinate. Pain should stop within a few minutes after you urinate. This may last for up to 1 week. ? A small amount of blood in your urine for several days. ? Feeling like you need to urinate but producing only a small amount of urine. Follow these instructions at home: Medicines  Take over-the-counter and prescription medicines only as told by your health care provider.  If you were prescribed an antibiotic medicine, take it as told by your health care provider. Do not stop taking the antibiotic even if you start to feel better. General instructions  Return to your normal activities as told by your health care provider. Ask your health care provider what activities are safe for you.  Do not drive for 24 hours if you were given a sedative during your procedure.  Watch for any blood in your urine. If the amount of blood in your urine increases, call your health care provider.  Follow instructions from your health care provider about eating or drinking restrictions.  If a tissue sample was removed for testing (biopsy) during your procedure, it is up to you to get your test results. Ask your health care provider, or the department that is doing the test, when your results will be ready.  Drink enough fluid to keep your urine pale yellow.  Keep all follow-up visits as told by your health care provider. This is important. Contact a health care  provider if you:  Have pain that gets worse or does not get better with medicine, especially pain when you urinate.  Have trouble urinating.  Have more blood in your urine. Get help right away if you:  Have blood clots in your urine.  Have abdominal pain.  Have a fever or chills.  Are unable to urinate. Summary  Cystoscopy is a procedure that is used to help diagnose and sometimes treat conditions that affect the lower urinary tract.  Cystoscopy is done using a thin, tube-shaped instrument with a light and camera at the end.  After the procedure, it is common to have some soreness or pain in your abdomen and urethra.  Watch for any blood in your urine. If the amount of blood in your urine increases, call your health care provider.  If you were prescribed an antibiotic medicine, take it as told by your health care provider. Do not stop taking the antibiotic even if you start to feel better. This information is not intended to replace advice given to you by  your health care provider. Make sure you discuss any questions you have with your health care provider. Document Revised: 05/24/2018 Document Reviewed: 05/24/2018 Elsevier Patient Education  [place] After This sheet gives you information about how to care for yourself after your procedure. Your health care provider may also give you more specific instructions. If you have problems or questions, contact your health care provider. What can I expect after the procedure? After the procedure, it is common to have:  Soreness and mild discomfort in your lower abdomen.  Mild pain when you urinate. Pain should stop within a few minutes after you urinate. This may last for up to a week.  A small amount of blood in your urine. Follow these instructions at home: Medicines  Take over-the-counter and prescription medicines only as told by your health care provider.  Do not drive for  24 hours if you were given a sedative during your procedure.  Do not drive or use heavy machinery while taking prescription pain medicine.  If you were prescribed an antibiotic medicine, take it as told by your health care provider. Do not stop taking the antibiotic even if you start to feel better. Lifestyle  Do not use any products that contain nicotine or tobacco, such as cigarettes, e-cigarettes, and chewing tobacco. If you need help quitting, ask your health care provider. Activity  Return to your normal activities as told by your health care provider. Ask your health care provider what activities are safe for you.  Do not lift anything that is heavier than 10 lb (4.5 kg), or the limit that you are told, until your health care provider says that it is safe. Eating and drinking   Follow instructions from your health care provider about eating or drinking restrictions.  Drink enough fluid to keep your urine pale yellow. General instructions  If a sample was removed for testing (biopsy), it is your responsibility to get your test results. Ask your health care provider or the department doing the test when your results will be ready.  Keep all follow-up visits as told by your health care provider. This is important. Contact a health care provider if you have:  Blood clots in your urine.  Pus in your urine.  Pain that gets worse or does not get better with medicine, especially pain when you urinate.  Difficulty urinating.  Nausea or you vomit for more than 2 days after the procedure.  A fever. Get help right away if you:  Have severe pain in your abdomen.  Cannot urinate.  Have chest pain or difficulty breathing.  Develop swelling or pain (or both) in your lower legs. Summary  After this procedure, it is common to have soreness in your lower abdomen, mild pain when you urinate, and a small amount of blood in your urine.  Return to your normal activities as told by  your health care provider.  Contact a health care provider if you have pain that gets worse, have a fever, see blood clots in your urine, or have difficulty urinating.  Get help right away if you have severe pain in your abdomen, cannot urinate, have difficulty breathing, or develop swelling and pain in the legs. This information is not intended to replace advice given to you by your health care provider. Make sure you discuss any questions you have with your health care provider. Document Revised: 09/22/2018 Document Reviewed: 12/09/2017 Elsevier Patient Education  [place].  General Anesthesia, Adult, Care After This sheet gives you information about how to care for yourself after your procedure. Your health care provider may also give you more specific instructions. If you have problems or questions, contact your health care provider. What can I expect after the procedure? After the procedure, the following side effects are common:  Pain or discomfort at the IV site.  Nausea.  Vomiting.  Sore throat.  Trouble concentrating.  Feeling cold or chills.  Weak or tired.  Sleepiness and fatigue.  Soreness and body aches. These side effects can affect parts of the body that were not involved in surgery. Follow these instructions at home:  For at least 24 hours after the procedure:  Have a responsible adult stay with you. It is important to have someone help care for you until you are awake and alert.  Rest as needed.  Do not: ? Participate in activities in which you could fall or become injured. ? Drive. ? Use heavy machinery. ? Drink alcohol. ? Take sleeping pills or medicines that cause drowsiness. ? Make important decisions or sign legal documents. ? Take care of children on your own. Eating and drinking  Follow any instructions from your health care provider about eating or drinking restrictions.  When you feel hungry, start by eating small amounts of foods  that are soft and easy to digest (bland), such as toast. Gradually return to your regular diet.  Drink enough fluid to keep your urine pale yellow.  If you vomit, rehydrate by drinking water, juice, or clear broth. General instructions  If you have sleep apnea, surgery and certain medicines can increase your risk for breathing problems. Follow instructions from your health care provider about wearing your sleep device: ? Anytime you are sleeping, including during daytime naps. ? While taking prescription pain medicines, sleeping medicines, or medicines that make you drowsy.  Return to your normal activities as told by your health care provider. Ask your health care provider what activities are safe for you.  Take over-the-counter and prescription medicines only as told by your health care provider.  If you smoke, do not smoke without supervision.  Keep all follow-up visits as told by your health care provider. This is important. Contact a health care provider if:  You have nausea or vomiting that does not get better with medicine.  You cannot eat or drink without vomiting.  You have pain that does not get better with medicine.  You are unable to pass urine.  You develop a skin rash.  You have a fever.  You have redness around your IV site that gets worse. Get help right away if:  You have difficulty breathing.  You have chest pain.  You have blood in your urine or stool, or you vomit blood. Summary  After the procedure, it is common to have a sore throat or nausea. It is also common to feel tired.  Have a responsible adult stay with you for the first 24 hours after general anesthesia. It is important to have someone help care for you until you are awake and alert.  When you feel hungry, start by eating small amounts of foods that are soft and easy to digest (bland), such as toast. Gradually return to your regular diet.  Drink enough fluid to keep your urine pale  yellow.  Return to your normal activities as told by your health care provider. Ask your health care provider what activities are safe for you. This information is  not intended to replace advice given to you by your health care provider. Make sure you discuss any questions you have with your health care provider. Document Revised: 06/04/2017 Document Reviewed: 01/15/2017 Elsevier Patient Education  [place].

## 2020-01-12 ENCOUNTER — Ambulatory Visit: Payer: Medicare HMO

## 2020-01-12 ENCOUNTER — Other Ambulatory Visit: Payer: Self-pay

## 2020-01-12 ENCOUNTER — Other Ambulatory Visit (HOSPITAL_COMMUNITY)
Admission: RE | Admit: 2020-01-12 | Discharge: 2020-01-12 | Disposition: A | Discharge status: Home / Self Care | Payer: Medicare HMO | Source: Ambulatory Visit | Attending: Urology | Admitting: Urology

## 2020-01-12 ENCOUNTER — Encounter (HOSPITAL_COMMUNITY)
Admission: RE | Admit: 2020-01-12 | Discharge: 2020-01-12 | Disposition: A | Discharge status: Home / Self Care | Payer: Medicare HMO | Source: Ambulatory Visit | Attending: Urology | Admitting: Urology

## 2020-01-12 ENCOUNTER — Encounter (HOSPITAL_COMMUNITY): Payer: Self-pay

## 2020-01-12 DIAGNOSIS — I10 Essential (primary) hypertension: Secondary | ICD-10-CM | POA: Diagnosis not present

## 2020-01-12 DIAGNOSIS — Z01818 Encounter for other preprocedural examination: Secondary | ICD-10-CM | POA: Insufficient documentation

## 2020-01-12 DIAGNOSIS — Z20822 Contact with and (suspected) exposure to covid-19: Secondary | ICD-10-CM | POA: Insufficient documentation

## 2020-01-12 LAB — BASIC METABOLIC PANEL
Anion gap: 8 (ref 5–15)
BUN: 12 mg/dL (ref 6–20)
CO2: 25 mmol/L (ref 22–32)
Calcium: 8.9 mg/dL (ref 8.9–10.3)
Chloride: 102 mmol/L (ref 98–111)
Creatinine, Ser: 0.76 mg/dL (ref 0.44–1.00)
GFR calc Af Amer: >60 mL/min (ref >60)
GFR calc non Af Amer: >60 mL/min (ref >60)
Glucose, Bld: 85 mg/dL (ref 70–99)
Potassium: 3.4 mmol/L — ABNORMAL LOW (ref 3.5–5.1)
Sodium: 135 mmol/L (ref 135–145)

## 2020-01-12 LAB — HCG, SERUM, QUALITATIVE: Preg, Serum: NEGATIVE

## 2020-01-12 LAB — SARS CORONAVIRUS 2 (TAT 6-24 HRS): SARS Coronavirus 2: NEGATIVE

## 2020-01-15 ENCOUNTER — Ambulatory Visit (HOSPITAL_COMMUNITY)
Admission: RE | Admit: 2020-01-15 | Discharge: 2020-01-15 | Disposition: A | Discharge status: Home / Self Care | Payer: Medicare HMO | Attending: Urology | Admitting: Urology

## 2020-01-15 ENCOUNTER — Ambulatory Visit: Payer: Medicare HMO

## 2020-01-15 ENCOUNTER — Ambulatory Visit (HOSPITAL_COMMUNITY): Payer: Medicare HMO | Admitting: Anesthesiology

## 2020-01-15 ENCOUNTER — Encounter (HOSPITAL_COMMUNITY)
Admission: RE | Disposition: A | Discharge status: Home / Self Care | Payer: Self-pay | Source: Home / Self Care | Attending: Urology

## 2020-01-15 DIAGNOSIS — N3020 Other chronic cystitis without hematuria: Secondary | ICD-10-CM | POA: Diagnosis not present

## 2020-01-15 DIAGNOSIS — L4050 Arthropathic psoriasis, unspecified: Secondary | ICD-10-CM | POA: Diagnosis not present

## 2020-01-15 DIAGNOSIS — K219 Gastro-esophageal reflux disease without esophagitis: Secondary | ICD-10-CM | POA: Insufficient documentation

## 2020-01-15 DIAGNOSIS — N3010 Interstitial cystitis (chronic) without hematuria: Secondary | ICD-10-CM | POA: Diagnosis not present

## 2020-01-15 DIAGNOSIS — N200 Calculus of kidney: Secondary | ICD-10-CM | POA: Insufficient documentation

## 2020-01-15 DIAGNOSIS — I10 Essential (primary) hypertension: Secondary | ICD-10-CM | POA: Insufficient documentation

## 2020-01-15 DIAGNOSIS — F329 Major depressive disorder, single episode, unspecified: Secondary | ICD-10-CM | POA: Diagnosis not present

## 2020-01-15 DIAGNOSIS — Z79899 Other long term (current) drug therapy: Secondary | ICD-10-CM | POA: Diagnosis not present

## 2020-01-15 DIAGNOSIS — N3289 Other specified disorders of bladder: Secondary | ICD-10-CM | POA: Insufficient documentation

## 2020-01-15 DIAGNOSIS — Z79890 Hormone replacement therapy (postmenopausal): Secondary | ICD-10-CM | POA: Insufficient documentation

## 2020-01-15 DIAGNOSIS — F419 Anxiety disorder, unspecified: Secondary | ICD-10-CM | POA: Insufficient documentation

## 2020-01-15 DIAGNOSIS — R69 Illness, unspecified: Secondary | ICD-10-CM | POA: Diagnosis not present

## 2020-01-15 HISTORY — PX: CYSTO WITH HYDRODISTENSION: SHX5453

## 2020-01-15 HISTORY — PX: CYSTOSCOPY WITH BIOPSY: SHX5122

## 2020-01-15 SURGERY — CYSTOSCOPY, WITH BLADDER HYDRODISTENSION
Anesthesia: General

## 2020-01-15 MED ORDER — LACTATED RINGERS IV SOLN: Freq: Once | INTRAVENOUS | Status: AC

## 2020-01-15 MED ORDER — MIDAZOLAM HCL 5 MG/5ML IJ SOLN
INTRAMUSCULAR | Status: DC | PRN
  Administered 2020-01-15: 2 mg via INTRAVENOUS

## 2020-01-15 MED ORDER — STERILE WATER FOR IRRIGATION IR SOLN
Status: DC | PRN
  Administered 2020-01-15: 3000 mL via INTRAVESICAL
  Administered 2020-01-15: 500 mL

## 2020-01-15 MED ORDER — SCOPOLAMINE 1 MG/3DAYS TD PT72
1.0000 | MEDICATED_PATCH | Freq: Once | TRANSDERMAL | Status: DC
  Administered 2020-01-15: 1.5 mg via TRANSDERMAL

## 2020-01-15 MED ORDER — KETOROLAC TROMETHAMINE 30 MG/ML IJ SOLN
INTRAMUSCULAR | Status: DC | PRN
  Administered 2020-01-15: 30 mg via INTRAVENOUS

## 2020-01-15 MED ORDER — PHENAZOPYRIDINE HCL 200 MG PO TABS: ORAL | Status: DC | PRN

## 2020-01-15 MED ORDER — FENTANYL CITRATE (PF) 100 MCG/2ML IJ SOLN
25.0000 ug | INTRAMUSCULAR | Status: DC | PRN
  Administered 2020-01-15 (×3): 50 ug via INTRAVENOUS
  Filled 2020-01-15 (×2): qty 2

## 2020-01-15 MED ORDER — ORAL CARE MOUTH RINSE: 15.0000 mL | Freq: Once | OROMUCOSAL | Status: AC

## 2020-01-15 MED ORDER — LIDOCAINE 2% (20 MG/ML) 5 ML SYRINGE
INTRAMUSCULAR | Status: AC
  Filled 2020-01-15: qty 5

## 2020-01-15 MED ORDER — PHENAZOPYRIDINE HCL 200 MG PO TABS
Freq: Once | ORAL | Status: DC
  Filled 2020-01-15: qty 15

## 2020-01-15 MED ORDER — PROMETHAZINE HCL 25 MG/ML IJ SOLN: 6.2500 mg | INTRAMUSCULAR | Status: DC | PRN

## 2020-01-15 MED ORDER — CEFAZOLIN SODIUM-DEXTROSE 2-4 GM/100ML-% IV SOLN
2.0000 g | INTRAVENOUS | Status: AC
  Administered 2020-01-15: 2 g via INTRAVENOUS
  Filled 2020-01-15: qty 100

## 2020-01-15 MED ORDER — FENTANYL CITRATE (PF) 100 MCG/2ML IJ SOLN
INTRAMUSCULAR | Status: DC | PRN
  Administered 2020-01-15: 100 ug via INTRAVENOUS

## 2020-01-15 MED ORDER — ONDANSETRON HCL 4 MG/2ML IJ SOLN
4.0000 mg | Freq: Once | INTRAMUSCULAR | Status: AC
  Administered 2020-01-15: 4 mg via INTRAVENOUS

## 2020-01-15 MED ORDER — MIDAZOLAM HCL 2 MG/2ML IJ SOLN
INTRAMUSCULAR | Status: AC
  Filled 2020-01-15: qty 2

## 2020-01-15 MED ORDER — PROPOFOL 10 MG/ML IV BOLUS
INTRAVENOUS | Status: AC
  Filled 2020-01-15: qty 20

## 2020-01-15 MED ORDER — LACTATED RINGERS IV SOLN
INTRAVENOUS | Status: DC | PRN
Start: 2020-01-15 — End: 2020-01-15

## 2020-01-15 MED ORDER — FENTANYL CITRATE (PF) 100 MCG/2ML IJ SOLN
INTRAMUSCULAR | Status: AC
  Filled 2020-01-15: qty 2

## 2020-01-15 MED ORDER — KETOROLAC TROMETHAMINE 30 MG/ML IJ SOLN
INTRAMUSCULAR | Status: AC
  Filled 2020-01-15: qty 1

## 2020-01-15 MED ORDER — SCOPOLAMINE 1 MG/3DAYS TD PT72
MEDICATED_PATCH | TRANSDERMAL | Status: AC
  Filled 2020-01-15: qty 1

## 2020-01-15 MED ORDER — ONDANSETRON HCL 4 MG/2ML IJ SOLN
INTRAMUSCULAR | Status: AC
  Filled 2020-01-15: qty 2

## 2020-01-15 MED ORDER — PROPOFOL 10 MG/ML IV BOLUS
INTRAVENOUS | Status: DC | PRN
  Administered 2020-01-15: 200 mg via INTRAVENOUS

## 2020-01-15 MED ORDER — HYDROCODONE-ACETAMINOPHEN 10-325 MG PO TABS: 1.0000 | ORAL_TABLET | Freq: Four times a day (QID) | ORAL | 0 refills | Status: DC | PRN

## 2020-01-15 MED ORDER — CHLORHEXIDINE GLUCONATE 0.12 % MT SOLN
15.0000 mL | Freq: Once | OROMUCOSAL | Status: AC
  Administered 2020-01-15: 15 mL via OROMUCOSAL

## 2020-01-15 MED ORDER — LIDOCAINE HCL (CARDIAC) PF 100 MG/5ML IV SOSY
PREFILLED_SYRINGE | INTRAVENOUS | Status: DC | PRN
  Administered 2020-01-15: 100 mg via INTRAVENOUS

## 2020-01-15 SURGICAL SUPPLY — 20 items
BAG DRAIN URO TABLE W/ADPT NS (BAG) ×2 IMPLANT
BAG DRN 8 ADPR NS SKTRN CSTL (BAG) ×1
BAG HAMPER (MISCELLANEOUS) ×2 IMPLANT
CATH ROBINSON RED A/P 16FR (CATHETERS) ×2 IMPLANT
CLOTH BEACON ORANGE TIMEOUT ST (SAFETY) ×2 IMPLANT
ELECT REM PT RETURN 9FT ADLT (ELECTROSURGICAL) ×2
ELECTRODE REM PT RTRN 9FT ADLT (ELECTROSURGICAL) ×1 IMPLANT
GLOVE BIO SURGEON STRL SZ8 (GLOVE) ×2 IMPLANT
GLOVE BIOGEL PI IND STRL 7.0 (GLOVE) ×2 IMPLANT
GLOVE BIOGEL PI INDICATOR 7.0 (GLOVE) ×2
GOWN STRL REUS W/TWL LRG LVL3 (GOWN DISPOSABLE) ×2 IMPLANT
GOWN STRL REUS W/TWL XL LVL3 (GOWN DISPOSABLE) ×2 IMPLANT
KIT TURNOVER CYSTO (KITS) ×2 IMPLANT
MANIFOLD NEPTUNE II (INSTRUMENTS) ×2 IMPLANT
PACK CYSTO (CUSTOM PROCEDURE TRAY) ×2 IMPLANT
PAD ARMBOARD 7.5X6 YLW CONV (MISCELLANEOUS) ×2 IMPLANT
PAD TELFA 3X4 1S STER (GAUZE/BANDAGES/DRESSINGS) ×1 IMPLANT
SYR TOOMEY 50ML (SYRINGE) ×4 IMPLANT
WATER STERILE IRR 3000ML UROMA (IV SOLUTION) ×2 IMPLANT
WATER STERILE IRR 500ML POUR (IV SOLUTION) ×1 IMPLANT

## 2020-01-15 NOTE — Discharge Instructions (Signed)
PLEASE REMOVE THE SCOPOLAMINE PATCH BEHIND YOUR RIGHT EAR (USED TO HELP CONTROL NAUSEA) ON Thursday January 18, 2020. [place] HANDS AFTER REMOVAL    Interstitial Cystitis  Interstitial cystitis is inflammation of the bladder. This may cause pain in the bladder area as well as a frequent and urgent need to urinate. The bladder is a hollow organ in the lower part of the abdomen. It stores urine after the urine is made in the kidneys. The severity of interstitial cystitis can vary from person to person. You may have flare-ups, and then your symptoms may go away for a while. For many people, it becomes a long-term (chronic) problem. What are the causes? The cause of this condition is not known. What increases the risk? The following factors may make you more likely to develop this condition:  You are female.  You have fibromyalgia.  You have irritable bowel syndrome (IBS).  You have endometriosis. This condition may be aggravated by:  Stress.  Smoking.  Spicy foods. What are the signs or symptoms? Symptoms of interstitial cystitis vary, and they can change over time. Symptoms may include:  Discomfort or pain in the bladder area, which is in the lower abdomen. Pain can range from mild to severe. The pain may change in intensity as the bladder fills with urine or as it empties.  Pain in the pelvic area, between the hip bones.  An urgent need to urinate.  Frequent urination.  Pain during urination.  Pain during sex.  Blood in the urine. For women, symptoms often get worse during menstruation. How is this diagnosed? This condition is diagnosed based on your symptoms, your medical history, and a physical exam. You may have tests to rule out other conditions, such as:  Urine tests.  Cystoscopy. For this test, a tool similar to a very thin telescope is used to look into your bladder.  Biopsy. This involves taking a sample of tissue from the bladder to be examined under a  microscope. How is this treated? There is no cure for this condition, but treatment can help you control your symptoms. Work closely with your health care provider to find the most effective treatments for you. Treatment options may include:  Medicines to relieve pain and reduce how often you feel the need to urinate.  Learning ways to control when you urinate (bladder training).  Lifestyle changes, such as changing your diet or taking steps to control stress.  Using a device that provides electrical stimulation to your nerves, which can relieve pain (neuromodulation therapy). The device is placed on your back, where it blocks the nerves that cause you to feel pain in your bladder area.  A procedure that stretches your bladder by filling it with air or fluid.  Surgery. This is rare. It is only done for extreme cases, if other treatments do not help. Follow these instructions at home: Bladder training   Use bladder training techniques as directed. Techniques may include: ? Urinating at scheduled times. ? Training yourself to delay urination. ? Doing exercises (Kegel exercises) to strengthen the muscles that control urine flow.  Keep a bladder diary. ? Write down the times that you urinate and any symptoms that you have. This can help you find out which foods, liquids, or activities make your symptoms worse. ? Use your bladder diary to schedule bathroom trips. If you are away from home, plan to be near a bathroom at each of your scheduled times.  Make sure that you urinate just  before you leave the house and just before you go to bed. Eating and drinking  Make dietary changes as recommended by your health care provider. You may need to avoid: ? Spicy foods. ? Foods that contain a lot of potassium.  Limit your intake of beverages that make you need to urinate. These include: ? Caffeinated beverages like soda, coffee, and tea. ? Alcohol. General instructions  Take over-the-counter  and prescription medicines only as told by your health care provider.  Do not drink alcohol.  You can try a warm or cool compress over your bladder for comfort.  Avoid wearing tight clothing.  Do not use any products that contain nicotine or tobacco, such as cigarettes and e-cigarettes. If you need help quitting, ask your health care provider.  Keep all follow-up visits as told by your health care provider. This is important. Contact a health care provider if you have:  Symptoms that do not get better with treatment.  Pain or discomfort that gets worse.  More frequent urges to urinate.  A fever. Get help right away if:  You have no control over when you urinate. Summary  Interstitial cystitis is inflammation of the bladder.  This condition may cause pain in the bladder area as well as a frequent and urgent need to urinate.  You may have flare-ups of the condition, and then it may go away for a while. For many people, it becomes a long-term (chronic) problem.  There is no cure for interstitial cystitis, but treatment methods are available to control your symptoms. This information is not intended to replace advice given to you by your health care provider. Make sure you discuss any questions you have with your health care provider. Document Revised: 05/14/2017 Document Reviewed: 04/26/2017 Elsevier Patient Education  2020 [place] Anesthesia, Adult, Care After This sheet gives you information about how to care for yourself after your procedure. Your health care provider may also give you more specific instructions. If you have problems or questions, contact your health care provider. What can I expect after the procedure? After the procedure, the following side effects are common:  Pain or discomfort at the IV site.  Nausea.  Vomiting.  Sore throat.  Trouble concentrating.  Feeling cold or chills.  Weak or tired.  Sleepiness and  fatigue.  Soreness and body aches. These side effects can affect parts of the body that were not involved in surgery. Follow these instructions at home:  For at least 24 hours after the procedure:  Have a responsible adult stay with you. It is important to have someone help care for you until you are awake and alert.  Rest as needed.  Do not: ? Participate in activities in which you could fall or become injured. ? Drive. ? Use heavy machinery. ? Drink alcohol. ? Take sleeping pills or medicines that cause drowsiness. ? Make important decisions or sign legal documents. ? Take care of children on your own. Eating and drinking  Follow any instructions from your health care provider about eating or drinking restrictions.  When you feel hungry, start by eating small amounts of foods that are soft and easy to digest (bland), such as toast. Gradually return to your regular diet.  Drink enough fluid to keep your urine pale yellow.  If you vomit, rehydrate by drinking water, juice, or clear broth. General instructions  If you have sleep apnea, surgery and certain medicines can increase your risk for breathing problems.  Follow instructions from your health care provider about wearing your sleep device: ? Anytime you are sleeping, including during daytime naps. ? While taking prescription pain medicines, sleeping medicines, or medicines that make you drowsy.  Return to your normal activities as told by your health care provider. Ask your health care provider what activities are safe for you.  Take over-the-counter and prescription medicines only as told by your health care provider.  If you smoke, do not smoke without supervision.  Keep all follow-up visits as told by your health care provider. This is important. Contact a health care provider if:  You have nausea or vomiting that does not get better with medicine.  You cannot eat or drink without vomiting.  You have pain that  does not get better with medicine.  You are unable to pass urine.  You develop a skin rash.  You have a fever.  You have redness around your IV site that gets worse. Get help right away if:  You have difficulty breathing.  You have chest pain.  You have blood in your urine or stool, or you vomit blood. Summary  After the procedure, it is common to have a sore throat or nausea. It is also common to feel tired.  Have a responsible adult stay with you for the first 24 hours after general anesthesia. It is important to have someone help care for you until you are awake and alert.  When you feel hungry, start by eating small amounts of foods that are soft and easy to digest (bland), such as toast. Gradually return to your regular diet.  Drink enough fluid to keep your urine pale yellow.  Return to your normal activities as told by your health care provider. Ask your health care provider what activities are safe for you. This information is not intended to replace advice given to you by your health care provider. Make sure you discuss any questions you have with your health care provider. Document Revised: 06/04/2017 Document Reviewed: 01/15/2017 Elsevier Patient Education  [place].

## 2020-01-15 NOTE — H&P (Signed)
1. Personal history of urinary infection   2. Renal stones   3. Chronic interstitial cystitis   4. Dysuria   5. Pelvic pain in female      12/08/19:  Bethany King returns today in f/u.  He culture from 6/4 was negative.  She had a KUB that shows small bilateral renal stones but no ureteral stones.  Her UA is unremarkable today.  She was given Gemteza samples at her last visit ofr her OAB symptoms.  She feels like the frequency decreased but the bladder pain and burning isn't better.   She has suprapubic pain with burning and constant urgency.  She had a prior HOD in 10/19 with benefit.  She remains on pyridium and her UA is nit+ as a result.   11/17/19: Bethany King returns today in f/u and reports a 3 month history of increased symptoms.  She has been on 2 round of antibiotics and has 2 days left of a round of keflex but her symptoms have not improved. She had been on macrobid for suppression but that hasn't helped.  She is off of that while on the keflex.   She has frequency 20x daily and nocturia 3-4x.  She has pain with a full bladder that is minimally relieved by voiding.  She doesn't feel she empties well.  She has had no hematuria.  She has no flank pain.  UA today has 3+ LE and trace heme.   Her last culture was in February and was negative.   She has IC but had a capacity of 1020ml on HOD in 10/19.  She has benefited from the HOD.   She has stones with small stones on KUB about 18 months ago.        ROS:  ROS:  A complete review of systems was performed. All systems are negative except for pertinent findings as noted.   ROS       Allergies  Allergen Reactions  . Hydromorphone Itching  . Prednisone Other (See Comments)    Increase anxiety symptoms, insomnia  . Tramadol Hcl Other (See Comments)    dizziness  . Sulfa Antibiotics Rash        Outpatient Encounter Medications as of 12/08/2019  Medication Sig  . ALPRAZolam (XANAX) 0.5 MG tablet Take 0.5 mg by mouth 3 (three)  times daily as needed.  . calcium carbonate (TUMS - DOSED IN MG ELEMENTAL CALCIUM) 500 MG chewable tablet Chew 2-3 tablets by mouth daily as needed for indigestion or heartburn.  . DULoxetine (CYMBALTA) 60 MG capsule Take 60 mg by mouth daily.  Marland Kitchen estradiol (ESTRACE) 1 MG tablet TAKE 1 TABLET(1 MG) BY MOUTH DAILY  . HYDROcodone-acetaminophen (NORCO) 10-325 MG tablet Take 1 tablet by mouth 4 (four) times daily as needed.  . nitrofurantoin, macrocrystal-monohydrate, (MACROBID) 100 MG capsule Take 100 mg by mouth at bedtime.  . phenazopyridine (PYRIDIUM) 200 MG tablet Take 1 tablet (200 mg total) by mouth 3 (three) times daily as needed for pain.  . potassium chloride SA (K-DUR,KLOR-CON) 20 MEQ tablet Take 40 mEq by mouth daily.   . Probiotic Product (ALIGN PO) Take 1 capsule by mouth daily.   . progesterone (PROMETRIUM) 200 MG capsule TAKE 1 CAPSULE(200 MG) BY MOUTH DAILY  . triamterene-hydrochlorothiazide (DYAZIDE) 37.5-25 MG capsule Take 1 capsule by mouth daily.   . Vibegron (GEMTESA) 75 MG TABS Take 75 mg by mouth daily.  . [DISCONTINUED] Adalimumab (HUMIRA PEN) 40 MG/0.4ML PNKT Inject 40 mg into the skin every 14 (fourteen)  days. Twice a month   . [DISCONTINUED] albuterol (VENTOLIN HFA) 108 (90 Base) MCG/ACT inhaler SMARTSIG:1-2 Puff(s) Via Inhaler Every 4 Hours PRN (Patient not taking: Reported on 12/08/2019)  . [DISCONTINUED] cephALEXin (KEFLEX) 500 MG capsule Take 500 mg by mouth 4 (four) times daily. (Patient not taking: Reported on 12/08/2019)  . [DISCONTINUED] COSENTYX, 300 MG DOSE, 150 MG/ML SOSY   . [DISCONTINUED] dicyclomine (BENTYL) 10 MG capsule Take 10 mg by mouth 3 (three) times daily. (Patient not taking: Reported on 12/08/2019)  . [DISCONTINUED] pantoprazole (PROTONIX) 40 MG tablet Take 1 tablet (40 mg total) by mouth daily before breakfast.   No facility-administered encounter medications on file as of 12/08/2019.        Past Medical History:  Diagnosis Date  . Allergic  rhinitis   . Anxiety   . Arthritis   . Bladder pain   . Complication of anesthesia   . Costochondritis    hx  . DDD (degenerative disc disease), lumbar   . Depression   . GERD (gastroesophageal reflux disease)   . Hepatic hemangioma 2006   stable  . History of adenomatous polyp of colon   . History of colitis   . History of gastritis   . History of kidney stones   . HTN (hypertension)   . Irritable bowel syndrome (IBS)   . Medullary sponge kidney    left  . Migraine   . Nephrolithiasis    bilateral-- nonobstructive per CT  . PONV (postoperative nausea and vomiting)   . Psoriatic arthritis ([place])   . Psoriatic arthritis ([place])   . Spinal stenosis   . Urgency of urination          Past Surgical History:  Procedure Laterality Date  . ANTERIOR CERVICAL DECOMP/DISCECTOMY FUSION  02/26/2014   C5 - C6; fusion with plate  . BIOPSY  01/04/2019   Procedure: BIOPSY;  Surgeon: Rogene Houston, MD;  [facility];  Service: Endoscopy;;  gastric polyps  . CARPAL TUNNEL RELEASE Bilateral right 04-01-2010/  left 05-30-2010  . CHOLECYSTECTOMY N/A 07/14/2013   Procedure: LAPAROSCOPIC CHOLECYSTECTOMY;  Surgeon: Jamesetta So, MD;  Location: [facility];  Service: General;  Laterality: N/A;  . COLONOSCOPY  10-13-2010  . COLONOSCOPY N/A 01/04/2019   Procedure: COLONOSCOPY;  Surgeon: Rogene Houston, MD;  [facility];  Service: Endoscopy;  Laterality: N/A;  . CYSTO WITH HYDRODISTENSION N/A 04/02/2015   Procedure: CYSTOSCOPY/HYDRODISTENSION, BLADDER BIOPSY WITH FULGURATION;  Surgeon: Irine Seal, MD;  Location: [facility];  Service: Urology;  Laterality: N/A;  . CYSTO WITH HYDRODISTENSION N/A 03/25/2018   Procedure: CYSTOSCOPY/HYDRODISTENSION, INSTILL MARCAIN AND PYRIDIUM;  Surgeon: Irine Seal, MD;  [facility];  Service: Urology;  Laterality: N/A;  . CYSTOSCOPY W/ URETERAL STENT PLACEMENT Bilateral 04/08/2016    Procedure: CYSTOSCOPY WITH BILATERAL RETROGRADE PYELOGRAM, BILATERAL URETERAL STENT PLACEMENT;  Surgeon: Cleon Gustin, MD;  [facility];  Service: Urology;  Laterality: Bilateral;  . CYSTOSCOPY W/ URETERAL STENT PLACEMENT Left 04/15/2016   Procedure: CYSTOSCOPY WITH RETROGRADE PYELOGRAM/URETERAL STENT PLACEMENT;  Surgeon: Cleon Gustin, MD;  [facility];  Service: Urology;  Laterality: Left;  . CYSTOSCOPY WITH HOLMIUM LASER LITHOTRIPSY Right 04/08/2016   Procedure: CYSTOSCOPY WITH RIGHT RENAL STONE EXTRACTION WITH HOLMIUM LASER LITHOTRIPSY;  Surgeon: Cleon Gustin, MD;  [facility];  Service: Urology;  Laterality: Right;  . CYSTOSCOPY WITH HOLMIUM LASER LITHOTRIPSY Left 04/15/2016   Procedure: CYSTOSCOPY WITH HOLMIUM LASER LITHOTRIPSY;  Surgeon: Saralyn Pilar  Alita Chyle, MD;  [facility];  Service: Urology;  Laterality: Left;  . CYSTOSCOPY WITH RETROGRADE PYELOGRAM, URETEROSCOPY AND STENT PLACEMENT Left 07/05/2017   Procedure: CYSTOSCOPY WITH RETROGRADE PYELOGRAM, URETEROSCOPY AND STENT PLACEMENT;  Surgeon: Cleon Gustin, MD;  [facility];  Service: Urology;  Laterality: Left;  . DIAGNOSTIC LAPAROSCOPY    . DILATION AND CURETTAGE OF UTERUS    . DILITATION & CURRETTAGE/HYSTROSCOPY WITH THERMACHOICE ABLATION N/A 09/07/2012   Procedure: DILATATION & CURETTAGE/HYSTEROSCOPY WITH THERMACHOICE ABLATION;  Surgeon: Florian Buff, MD;  [facility];  Service: Gynecology;  Laterality: N/A;  . ESOPHAGOGASTRODUODENOSCOPY  10/14/10  . ESOPHAGOGASTRODUODENOSCOPY N/A 01/04/2019   Procedure: ESOPHAGOGASTRODUODENOSCOPY (EGD);  Surgeon: Rogene Houston, MD;  [facility];  Service: Endoscopy;  Laterality: N/A;  9:30  . EXTRACORPOREAL SHOCK WAVE LITHOTRIPSY  multiple  . HOLMIUM LASER APPLICATION Left 5/69/7948   Procedure: HOLMIUM LASER APPLICATION;  Surgeon: Cleon Gustin, MD;  [facility];  Service: Urology;  Laterality: Left;  . PERCUTANEOUS  NEPHROSTOLITHOTOMY  2003  . PLANTAR FASCIA SURGERY Bilateral right 2008//  left 2010  . POLYPECTOMY  01/04/2019   Procedure: POLYPECTOMY;  Surgeon: Rogene Houston, MD;  [facility];  Service: Endoscopy;;  colon  . STONE EXTRACTION WITH BASKET Left 04/15/2016   Procedure: STONE EXTRACTION WITH BASKET;  Surgeon: Cleon Gustin, MD;  [facility];  Service: Urology;  Laterality: Left;    Social History        Socioeconomic History  . Marital status: Divorced    Spouse name: Not on file  . Number of children: 0  . Years of education: Not on file  . Highest education level: Not on file  Occupational History  . Occupation: ORDER PROCESSER    Employer: [employer]  Tobacco Use  . Smoking status: Never Smoker  . Smokeless tobacco: Never Used  Vaping Use  . Vaping Use: Never used  Substance and Sexual Activity  . Alcohol use: No  . Drug use: No  . Sexual activity: Yes    Birth control/protection: Surgical    Comment: ablation  Other Topics Concern  . Not on file  Social History Narrative  . Not on file   Social Determinants of Health      Financial Resource Strain:   . Difficulty of Paying Living Expenses:   Food Insecurity:   . Worried About Charity fundraiser in the Last Year:   . Arboriculturist in the Last Year:   Transportation Needs:   . Film/video editor (Medical):   Marland Kitchen Lack of Transportation (Non-Medical):   Physical Activity:   . Days of Exercise per Week:   . Minutes of Exercise per Session:   Stress:   . Feeling of Stress :   Social Connections:   . Frequency of Communication with Friends and Family:   . Frequency of Social Gatherings with Friends and Family:   . Attends Religious Services:   . Active Member of Clubs or Organizations:   . Attends Archivist Meetings:   Marland Kitchen Marital Status:   Intimate Partner Violence:   . Fear of Current or Ex-Partner:   . Emotionally Abused:   Marland Kitchen Physically Abused:    . Sexually Abused:          Family History  Problem Relation Age of Onset  . Irritable bowel syndrome Mother   . Scleroderma Mother   . Rheum arthritis Mother   .  Asthma Mother   . Melanoma Father   . Cancer Father        Melanoma  . Colon cancer Cousin 63       second cousin Sunday Spillers [place])  . Cancer Maternal Grandmother        Ovarian  . Diabetes Maternal Grandmother   . Thyroid disease Maternal Aunt        Objective:    Vitals:   12/08/19 1357  BP: 127/74  Pulse: 88  Temp: 98.4 F (36.9 C)     Physical Exam  Lab Results:    UA reviewed.   Culture from 2/21 reviewed.    Studies/Results: KUB reviewed.     Assessment & Plan: Painful bladder with frequency and nocturia.   I discussed additional options including office bladder instillations with [place] and [facility] or a cysto HOD as an outpatient.  She wishes to proceed with hydrodistention. Risks/benefits/alterntiave discussed

## 2020-01-15 NOTE — Transfer of Care (Signed)
Immediate Anesthesia Transfer of Care Note  Patient: Bethany King  Procedure(s) Performed: CYSTOSCOPY/HYDRODISTENSION WITH BLADDER BIOPSY (N/A )  Patient Location: PACU  Anesthesia Type:General  Level of Consciousness: awake, alert , oriented and patient cooperative  Airway & Oxygen Therapy: Patient Spontanous Breathing  Post-op Assessment: Report given to RN and Post -op Vital signs reviewed and stable  Post vital signs: Reviewed and stable  Last Vitals:  Vitals Value Taken Time  BP 125/79 01/15/20 1142  Temp    Pulse 90 01/15/20 1143  Resp 18 01/15/20 1143  SpO2 97 % 01/15/20 1143  Vitals shown include unvalidated device data.  Last Pain:  Vitals:   01/15/20 1036  TempSrc: Oral  PainSc: 5       Patients Stated Pain Goal: 5 (71/21/97 5883)  Complications: No complications documented.

## 2020-01-15 NOTE — Anesthesia Procedure Notes (Signed)
Procedure Name: LMA Insertion Date/Time: 01/15/2020 11:12 AM Performed by: Jonna Munro, CRNA Pre-anesthesia Checklist: Patient identified, Emergency Drugs available, Suction available, Patient being monitored and Timeout performed Patient Re-evaluated:Patient Re-evaluated prior to induction Oxygen Delivery Method: Circle system utilized Preoxygenation: Pre-oxygenation with 100% oxygen Induction Type: IV induction LMA: LMA inserted LMA Size: 4.0 Number of attempts: 1 Placement Confirmation: positive ETCO2 and breath sounds checked- equal and bilateral Tube secured with: Tape Dental Injury: Teeth and Oropharynx as per pre-operative assessment

## 2020-01-15 NOTE — Anesthesia Postprocedure Evaluation (Signed)
Anesthesia Post Note  Patient: Bethany King  Procedure(s) Performed: CYSTOSCOPY/HYDRODISTENSION WITH BLADDER BIOPSY (N/A )  Patient location during evaluation: PACU Anesthesia Type: General Level of consciousness: awake, oriented, awake and alert and patient cooperative Pain management: satisfactory to patient Vital Signs Assessment: post-procedure vital signs reviewed and stable Respiratory status: spontaneous breathing, respiratory function stable and nonlabored ventilation Cardiovascular status: stable Postop Assessment: no apparent nausea or vomiting Anesthetic complications: no   No complications documented.   Last Vitals:  Vitals:   01/15/20 1036 01/15/20 1144  BP: 126/74 125/79  Pulse: 80 90  Resp: (!) 11 18  Temp: 37.2 C 37 C  SpO2: 94% 97%    Last Pain:  Vitals:   01/15/20 1036  TempSrc: Oral  PainSc: 5                  Davie Sagona

## 2020-01-15 NOTE — Anesthesia Preprocedure Evaluation (Signed)
Anesthesia Evaluation  Patient identified by MRN, date of birth, ID band Patient awake    History of Anesthesia Complications (+) PONV and history of anesthetic complications  Airway Mallampati: I  TM Distance: >3 FB Neck ROM: Limited   Comment: Neck fusion  Dental  (+) Teeth Intact, Dental Advisory Given   Pulmonary    breath sounds clear to auscultation       Cardiovascular hypertension, Pt. on medications Normal cardiovascular exam Rhythm:Regular Rate:Normal     Neuro/Psych  Headaches, PSYCHIATRIC DISORDERS Anxiety Depression  Neuromuscular disease    GI/Hepatic GERD  Medicated,  Endo/Other    Renal/GU Renal disease     Musculoskeletal  (+) Arthritis  (neck fusion , spinal stenosis),   Abdominal   Peds  Hematology   Anesthesia Other Findings   Reproductive/Obstetrics                            Anesthesia Physical Anesthesia Plan  ASA: II  Anesthesia Plan: General   Post-op Pain Management:    Induction: Intravenous  PONV Risk Score and Plan:   Airway Management Planned: LMA  Additional Equipment:   Intra-op Plan:   Post-operative Plan: Extubation in OR  Informed Consent: I have reviewed the patients History and Physical, chart, labs and discussed the procedure including the risks, benefits and alternatives for the proposed anesthesia with the patient or authorized representative who has indicated his/her understanding and acceptance.       Plan Discussed with: CRNA and Surgeon  Anesthesia Plan Comments:         Anesthesia Quick Evaluation

## 2020-01-15 NOTE — Op Note (Signed)
.  Preoperative diagnosis: Interstitial cystitis, Postoperative diagnosis: Same  Procedure: 1 cystoscopy 2. Hydrodistention to 100cm H2O 3. Bladder biopsy and fulgeration 4. Bladder irrigation with bupivicaine and pyridium  Attending: Nicolette Bang  Anesthesia: General  Estimated blood loss: Minimal  Drains: none  Specimens: bladder biopsy, dome  Antibiotics: Ancef  Findings: 1cm ulcer at dome. Bladder capacity 600cc. moderate glomerulations.  Indications: Patient is a 49 year old female with a history of interstitial cystitis.  After discussing treatment options, they decided proceed with hydrodistention.  Procedure her in detail: The patient was brought to the operating room and a brief timeout was done to ensure correct patient, correct procedure, correct site.  General anesthesia was administered patient was placed in dorsal lithotomy position.  Their genitalia was then prepped and draped in usual sterile fashion.  A rigid 70 French cystoscope was passed in the urethra and the bladder.  Bladder was inspected and we noted no masses/lesions. Ureteral orifices were in the normal anatomic location. We then proceeded to hydrodistend the bladder to 100cm of water. Once we reached 100cm of water we then held the fluid in the bladder for 5 minutes. We then emptied the bladder and noted moderate glomerulations and a bladder capacity of 600cc. We then took a cold cup biopsy of the dome at the area of ulceration and then used a bugbee to obtain hemostasis.  the bladder was then drained, and 15cc of bupivicaine with 200mg  pyridium was instilled into the bladder.  This then concluded the procedure which was well tolerated by patient.  Complications: None  Condition: Stable, extubated, transferred to PACU  Plan: Patient is to be discharged home. She is to followup in 2 weeks.

## 2020-01-16 ENCOUNTER — Other Ambulatory Visit: Payer: Self-pay | Admitting: Urology

## 2020-01-16 ENCOUNTER — Encounter (HOSPITAL_COMMUNITY): Payer: Self-pay | Admitting: Urology

## 2020-01-16 ENCOUNTER — Telehealth: Payer: Self-pay

## 2020-01-16 LAB — SURGICAL PATHOLOGY

## 2020-01-16 MED ORDER — URIBEL 118 MG PO CAPS: 1.0000 | ORAL_CAPSULE | Freq: Two times a day (BID) | ORAL | 1 refills | Status: DC | PRN

## 2020-01-16 NOTE — Telephone Encounter (Signed)
Rx sent 

## 2020-01-16 NOTE — Telephone Encounter (Signed)
Patient left vmail stating that she is a 7/10 in pain with Hydrocodone and would like to know how to proceed?

## 2020-01-16 NOTE — Progress Notes (Signed)
Patient stated pain this morning was tolerable but still having a pain score of 4-6 with burning sensation. Hydrocodone brings pain down and instructed to call office immediately or go to ER if pain was not manageable with hydrocodone. Patient has office number.

## 2020-01-28 ENCOUNTER — Ambulatory Visit
Admission: RE | Admit: 2020-01-28 | Discharge: 2020-01-28 | Disposition: A | Discharge status: Home / Self Care | Payer: Medicare HMO | Source: Ambulatory Visit | Attending: Emergency Medicine | Admitting: Emergency Medicine

## 2020-01-28 ENCOUNTER — Other Ambulatory Visit: Payer: Self-pay

## 2020-01-28 VITALS — BP 122/81 | HR 64 | Temp 98.7°F | Resp 19 | Ht 63.0 in | Wt 164.9 lb

## 2020-01-28 DIAGNOSIS — N390 Urinary tract infection, site not specified: Secondary | ICD-10-CM | POA: Diagnosis not present

## 2020-01-28 DIAGNOSIS — R300 Dysuria: Secondary | ICD-10-CM | POA: Insufficient documentation

## 2020-01-28 LAB — POCT URINALYSIS DIP (MANUAL ENTRY)
Bilirubin, UA: NEGATIVE
Glucose, UA: NEGATIVE mg/dL
Ketones, POC UA: NEGATIVE mg/dL
Nitrite, UA: NEGATIVE
Protein Ur, POC: NEGATIVE mg/dL
Spec Grav, UA: 1.015 (ref 1.010–1.025)
Urobilinogen, UA: 0.2 E.U./dL
pH, UA: 7 (ref 5.0–8.0)

## 2020-01-28 MED ORDER — CEPHALEXIN 500 MG PO CAPS
500.0000 mg | ORAL_CAPSULE | Freq: Three times a day (TID) | ORAL | 0 refills | Status: AC
Start: 2020-01-28 — End: 2020-02-04

## 2020-01-28 NOTE — ED Provider Notes (Addendum)
[facility]   CC: Burning with urination  SUBJECTIVE:  Bethany King is a 49 y.o. female who presented to the urgent care for complaint of dysuria and cloudy urine that started yesterday.  Clot has resolved.  Patient denies a precipitating event, recent sexual encounter, excessive caffeine intake.   Has tried OTC medications without relief.  Symptoms are made worse with urination.  Admits to similar symptoms in the past.  Denies fever, chills, nausea, vomiting, abdominal pain,  abnormal vaginal discharge or bleeding, hematuria.    LMP: No LMP recorded. Patient has had an ablation.  ROS: As in HPI.  All other pertinent ROS negative.     Past Medical History:  Diagnosis Date   Allergic rhinitis    Anxiety    Arthritis    Bladder pain    Complication of anesthesia    Costochondritis    hx   DDD (degenerative disc disease), lumbar    Depression    GERD (gastroesophageal reflux disease)    Hepatic hemangioma 2006   stable   History of adenomatous polyp of colon    History of colitis    History of gastritis    History of kidney stones    HTN (hypertension)    Irritable bowel syndrome (IBS)    Medullary sponge kidney    left   Migraine    Nephrolithiasis    bilateral-- nonobstructive per CT   PONV (postoperative nausea and vomiting)    Psoriatic arthritis (HCC)    Psoriatic arthritis ([place])    Spinal stenosis    Urgency of urination    Past Surgical History:  Procedure Laterality Date   ANTERIOR CERVICAL DECOMP/DISCECTOMY FUSION  02/26/2014   C5 - C6; fusion with plate   BIOPSY  9/35/7017   Procedure: BIOPSY;  Surgeon: Rogene Houston, MD;  [facility]: [facility];  Service: Endoscopy;;  gastric polyps   CARPAL TUNNEL RELEASE Bilateral right 04-01-2010/  left 05-30-2010   CHOLECYSTECTOMY N/A 07/14/2013   Procedure: LAPAROSCOPIC CHOLECYSTECTOMY;  Surgeon: Jamesetta So, MD;  Location: [facility];  Service: General;  Laterality: N/A;     COLONOSCOPY  10-13-2010   COLONOSCOPY N/A 01/04/2019   Procedure: COLONOSCOPY;  Surgeon: Rogene Houston, MD;  [facility];  Service: Endoscopy;  Laterality: N/A;   CYSTO WITH HYDRODISTENSION N/A 04/02/2015   Procedure: CYSTOSCOPY/HYDRODISTENSION, BLADDER BIOPSY WITH FULGURATION;  Surgeon: Irine Seal, MD;  Location: [facility];  Service: Urology;  Laterality: N/A;   CYSTO WITH HYDRODISTENSION N/A 03/25/2018   Procedure: CYSTOSCOPY/HYDRODISTENSION, INSTILL MARCAIN AND PYRIDIUM;  Surgeon: Irine Seal, MD;  Location: [facility];  Service: Urology;  Laterality: N/A;   CYSTO WITH HYDRODISTENSION N/A 01/15/2020   Procedure: CYSTOSCOPY/HYDRODISTENSION;  Surgeon: Cleon Gustin, MD;  Location: [facility];  Service: Urology;  Laterality: N/A;   CYSTOSCOPY W/ URETERAL STENT PLACEMENT Bilateral 04/08/2016   Procedure: CYSTOSCOPY WITH BILATERAL RETROGRADE PYELOGRAM, BILATERAL URETERAL STENT PLACEMENT;  Surgeon: Cleon Gustin, MD;  Location: [facility];  Service: Urology;  Laterality: Bilateral;   CYSTOSCOPY W/ URETERAL STENT PLACEMENT Left 04/15/2016   Procedure: CYSTOSCOPY WITH RETROGRADE PYELOGRAM/URETERAL STENT PLACEMENT;  Surgeon: Cleon Gustin, MD;  [facility];  Service: Urology;  Laterality: Left;   CYSTOSCOPY WITH BIOPSY N/A 01/15/2020   Procedure: CYSTOSCOPY WITH BLADDER BIOPSY AND FULGERATION;  Surgeon: Cleon Gustin, MD;  [facility];  Service: Urology;  Laterality: N/A;   CYSTOSCOPY WITH HOLMIUM LASER LITHOTRIPSY Right 04/08/2016   Procedure:  CYSTOSCOPY WITH RIGHT RENAL STONE EXTRACTION WITH HOLMIUM LASER LITHOTRIPSY;  Surgeon: Cleon Gustin, MD;  [facility];  Service: Urology;  Laterality: Right;   CYSTOSCOPY WITH HOLMIUM LASER LITHOTRIPSY Left 04/15/2016   Procedure: CYSTOSCOPY WITH HOLMIUM LASER LITHOTRIPSY;  Surgeon: Cleon Gustin, MD;  [facility];  Service: Urology;  Laterality: Left;   CYSTOSCOPY WITH RETROGRADE  PYELOGRAM, URETEROSCOPY AND STENT PLACEMENT Left 07/05/2017   Procedure: CYSTOSCOPY WITH RETROGRADE PYELOGRAM, URETEROSCOPY AND STENT PLACEMENT;  Surgeon: Cleon Gustin, MD;  [facility];  Service: Urology;  Laterality: Left;   DIAGNOSTIC LAPAROSCOPY     DILATION AND CURETTAGE OF UTERUS     DILITATION & CURRETTAGE/HYSTROSCOPY WITH THERMACHOICE ABLATION N/A 09/07/2012   Procedure: DILATATION & CURETTAGE/HYSTEROSCOPY WITH THERMACHOICE ABLATION;  Surgeon: Florian Buff, MD;  [facility];  Service: Gynecology;  Laterality: N/A;   ESOPHAGOGASTRODUODENOSCOPY  10/14/10   ESOPHAGOGASTRODUODENOSCOPY N/A 01/04/2019   Procedure: ESOPHAGOGASTRODUODENOSCOPY (EGD);  Surgeon: Rogene Houston, MD;  [facility];  Service: Endoscopy;  Laterality: N/A;  9:30   EXTRACORPOREAL SHOCK WAVE LITHOTRIPSY  multiple   HOLMIUM LASER APPLICATION Left 6/37/8588   Procedure: HOLMIUM LASER APPLICATION;  Surgeon: Cleon Gustin, MD;  [facility];  Service: Urology;  Laterality: Left;   PERCUTANEOUS NEPHROSTOLITHOTOMY  2003   PLANTAR FASCIA SURGERY Bilateral right 2008//  left 2010   POLYPECTOMY  01/04/2019   Procedure: POLYPECTOMY;  Surgeon: Rogene Houston, MD;  [facility];  Service: Endoscopy;;  colon   STONE EXTRACTION WITH BASKET Left 04/15/2016   Procedure: STONE EXTRACTION WITH BASKET;  Surgeon: Cleon Gustin, MD;  Location[facility];  Service: Urology;  Laterality: Left;   Allergies  Allergen Reactions   Hydromorphone Itching   Prednisone Other (See Comments)    Increase anxiety symptoms, insomnia   Tramadol Hcl Other (See Comments)    dizziness   Sulfa Antibiotics Rash   No current facility-administered medications on file prior to encounter.   Current Outpatient Medications on File Prior to Encounter  Medication Sig Dispense Refill   ALPRAZolam (XANAX) 0.5 MG tablet Take 0.5 mg by mouth 3 (three) times daily as needed for anxiety.       COSENTYX, 300 MG DOSE, 150 MG/ML SOSY Inject 300 mg into the skin every 30 (thirty) days.     DULoxetine (CYMBALTA) 60 MG capsule Take 60 mg by mouth daily.     estradiol (ESTRACE) 1 MG tablet TAKE 1 TABLET(1 MG) BY MOUTH DAILY (Patient taking differently: Take 1 mg by mouth daily. ) 30 tablet 3   fluticasone (FLONASE) 50 MCG/ACT nasal spray Place 2 sprays into both nostrils daily.     HYDROcodone-acetaminophen (NORCO) 10-325 MG tablet Take 1 tablet by mouth 4 (four) times daily as needed for moderate pain. 30 tablet 0   ibuprofen (ADVIL) 200 MG tablet Take 200 mg by mouth in the morning and at bedtime.     Meth-Hyo-M Bl-Na Phos-Ph Sal (URIBEL) 118 MG CAPS Take 1 capsule (118 mg total) by mouth 2 (two) times daily as needed. 30 capsule 1   nitrofurantoin, macrocrystal-monohydrate, (MACROBID) 100 MG capsule Take 100 mg by mouth at bedtime.     phenazopyridine (PYRIDIUM) 200 MG tablet Take 1 tablet (200 mg total) by mouth 3 (three) times daily as needed for pain. (Patient taking differently: Take 200 mg by mouth in the morning, at noon, and at bedtime. ) 30 tablet 3   potassium chloride SA (K-DUR,KLOR-CON) 20  MEQ tablet Take 40 mEq by mouth 2 (two) times daily.      progesterone (PROMETRIUM) 200 MG capsule TAKE 1 CAPSULE(200 MG) BY MOUTH DAILY (Patient taking differently: Take 200 mg by mouth at bedtime. ) 30 capsule 3   triamterene-hydrochlorothiazide (DYAZIDE) 37.5-25 MG capsule Take 1 capsule by mouth daily.   11   Vibegron (GEMTESA) 75 MG TABS Take 75 mg by mouth daily. 28 tablet 0   [DISCONTINUED] Adalimumab (HUMIRA PEN) 40 MG/0.4ML PNKT Inject 40 mg into the skin every 14 (fourteen) days. Twice a month      [DISCONTINUED] pantoprazole (PROTONIX) 40 MG tablet Take 1 tablet (40 mg total) by mouth daily before breakfast.     Social History   Socioeconomic History   Marital status: Divorced    Spouse name: Not on file   Number of children: 0   Years of education: Not on file     Highest education level: Not on file  Occupational History   Occupation: [place] [place]    Employer: [employer]  Tobacco Use   Smoking status: Never Smoker   Smokeless tobacco: Never Used  Vaping Use   Vaping Use: Never used  Substance and Sexual Activity   Alcohol use: No   Drug use: No   Sexual activity: Yes    Birth control/protection: Surgical    Comment: ablation  Other Topics Concern   Not on file  Social History Narrative   Not on file   Social Determinants of Health   Financial Resource Strain:    Difficulty of Paying Living Expenses:   Food Insecurity:    Worried About Charity fundraiser in the Last Year:    Arboriculturist in the Last Year:   Transportation Needs:    Film/video editor (Medical):    Lack of Transportation (Non-Medical):   Physical Activity:    Days of Exercise per Week:    Minutes of Exercise per Session:   Stress:    Feeling of Stress :   Social Connections:    Frequency of Communication with Friends and Family:    Frequency of Social Gatherings with Friends and Family:    Attends Religious Services:    Active Member of Clubs or Organizations:    Attends Music therapist:    Marital Status:   Intimate Partner Violence:    Fear of Current or Ex-Partner:    Emotionally Abused:    Physically Abused:    Sexually Abused:    Family History  Problem Relation Age of Onset   Irritable bowel syndrome Mother    Scleroderma Mother    Rheum arthritis Mother    Asthma Mother    Melanoma Father    Cancer Father        Melanoma   Colon cancer Cousin 67       second cousin Sunday Spillers Lovelace)   Cancer Maternal Grandmother        Ovarian   Diabetes Maternal Grandmother    Thyroid disease Maternal Aunt     OBJECTIVE:  Vitals:   01/28/20 1321 01/28/20 1323  BP:  122/81  Pulse:  64  Resp:  19  Temp:  98.7 F (37.1 C)  TempSrc:  Oral  SpO2:  96%  Weight: 164 lb 14.5 oz  (74.8 kg)   Height: 5\' 3"  (1.6 m)    General appearance: AOx3 in no acute distress HEENT: NCAT.  Oropharynx clear.  Lungs: clear to auscultation bilaterally without adventitious  breath sounds Heart: regular rate and rhythm.  Radial pulses 2+ symmetrical bilaterally Abdomen: soft; non-distended; no tenderness; bowel sounds present; no guarding or rebound tenderness Back: Left CVA tenderness Extremities: no edema; symmetrical with no gross deformities Skin: warm and dry Neurologic: Ambulates from chair to exam table without difficulty Psychological: alert and cooperative; normal mood and affect  Labs Reviewed  POCT URINALYSIS DIP (MANUAL ENTRY) - Abnormal; Notable for the following components:      Result Value   Blood, UA moderate (*)    Leukocytes, UA Small (1+) (*)    All other components within normal limits  URINE CULTURE    ASSESSMENT & PLAN:  1. Dysuria   2. Acute lower UTI     Meds ordered this encounter  Medications   cephALEXin (KEFLEX) 500 MG capsule    Sig: Take 1 capsule (500 mg total) by mouth 3 (three) times daily for 7 days.    Dispense:  21 capsule    Refill:  0   Discharge Instructions Urine culture sent.  We will call you with the results.   Push fluids and get plenty of rest.   Take antibiotic as directed and to completion Continue pyridium as prescribed and as needed for symptomatic relief Follow up with PCP if symptoms persists Return here or go to ER if you have any new or worsening symptoms such as fever, worsening abdominal pain, nausea/vomiting, flank pain, etc...  Outlined signs and symptoms indicating need for more acute intervention. Patient verbalized understanding. After Visit Summary given.  Note: This document was prepared using Dragon voice recognition software and may include unintentional dictation errors.    Emerson Monte, FNP 01/28/20 1427    Emerson Monte, FNP 01/28/20 1428

## 2020-01-28 NOTE — Discharge Instructions (Addendum)
Urine culture sent.  We will call you with the results.   Push fluids and get plenty of rest.   Take antibiotic as directed and to completion Continue pyridium as prescribed and as needed for symptomatic relief Follow up with PCP if symptoms persists Return here or go to ER if you have any new or worsening symptoms such as fever, worsening abdominal pain, nausea/vomiting, flank pain, etc..Marland Kitchen

## 2020-01-28 NOTE — ED Triage Notes (Signed)
Pt had a procedure on her bladder x 2 weeks ago.  Started burning with urination and urinating clots yesterday. That has stopped but she is still having burning with urination.

## 2020-01-29 ENCOUNTER — Telehealth: Payer: Self-pay

## 2020-01-29 LAB — URINE CULTURE
Culture: NO GROWTH
Special Requests: NORMAL

## 2020-01-29 NOTE — Telephone Encounter (Signed)
Pt called and made aware

## 2020-01-29 NOTE — Telephone Encounter (Signed)
She should just drink plenty of fluid and if the bleeding recurs let us know.

## 2020-02-09 ENCOUNTER — Encounter: Payer: Self-pay | Admitting: Urology

## 2020-02-09 ENCOUNTER — Other Ambulatory Visit: Payer: Self-pay

## 2020-02-09 ENCOUNTER — Ambulatory Visit (INDEPENDENT_AMBULATORY_CARE_PROVIDER_SITE_OTHER): Payer: Medicare HMO | Admitting: Urology

## 2020-02-09 ENCOUNTER — Ambulatory Visit: Payer: Medicare HMO | Admitting: Urology

## 2020-02-09 VITALS — BP 119/73 | HR 73 | Temp 98.2°F | Ht 63.0 in | Wt 164.9 lb

## 2020-02-09 DIAGNOSIS — N3010 Interstitial cystitis (chronic) without hematuria: Secondary | ICD-10-CM

## 2020-02-09 LAB — URINALYSIS, ROUTINE W REFLEX MICROSCOPIC
Bilirubin, UA: NEGATIVE
Glucose, UA: NEGATIVE
Nitrite, UA: NEGATIVE
Specific Gravity, UA: 1.02 (ref 1.005–1.030)
Urobilinogen, Ur: 0.2 mg/dL (ref 0.2–1.0)
pH, UA: 5.5 (ref 5.0–7.5)

## 2020-02-09 LAB — MICROSCOPIC EXAMINATION
Epithelial Cells (non renal): >10 /hpf — AB (ref 0–10)
Renal Epithel, UA: NONE SEEN /hpf

## 2020-02-09 MED ORDER — ELMIRON 100 MG PO CAPS
100.0000 mg | ORAL_CAPSULE | Freq: Three times a day (TID) | ORAL | 3 refills | Status: DC
Start: 2020-02-09 — End: 2020-07-03

## 2020-02-09 MED ORDER — CEFPODOXIME PROXETIL 200 MG PO TABS: 200.0000 mg | ORAL_TABLET | Freq: Two times a day (BID) | ORAL | 0 refills | Status: DC

## 2020-02-09 MED ORDER — HYDROXYZINE HCL 10 MG PO TABS: 10.0000 mg | ORAL_TABLET | Freq: Every evening | ORAL | 3 refills | Status: DC

## 2020-02-09 NOTE — Patient Instructions (Signed)
Interstitial Cystitis  Interstitial cystitis is inflammation of the bladder. This may cause pain in the bladder area as well as a frequent and urgent need to urinate. The bladder is a hollow organ in the lower part of the abdomen. It stores urine after the urine is made in the kidneys. The severity of interstitial cystitis can vary from person to person. You may have flare-ups, and then your symptoms may go away for a while. For many people, it becomes a long-term (chronic) problem. What are the causes? The cause of this condition is not known. What increases the risk? The following factors may make you more likely to develop this condition:  You are female.  You have fibromyalgia.  You have irritable bowel syndrome (IBS).  You have endometriosis. This condition may be aggravated by:  Stress.  Smoking.  Spicy foods. What are the signs or symptoms? Symptoms of interstitial cystitis vary, and they can change over time. Symptoms may include:  Discomfort or pain in the bladder area, which is in the lower abdomen. Pain can range from mild to severe. The pain may change in intensity as the bladder fills with urine or as it empties.  Pain in the pelvic area, between the hip bones.  An urgent need to urinate.  Frequent urination.  Pain during urination.  Pain during sex.  Blood in the urine. For women, symptoms often get worse during menstruation. How is this diagnosed? This condition is diagnosed based on your symptoms, your medical history, and a physical exam. You may have tests to rule out other conditions, such as:  Urine tests.  Cystoscopy. For this test, a tool similar to a very thin telescope is used to look into your bladder.  Biopsy. This involves taking a sample of tissue from the bladder to be examined under a microscope. How is this treated? There is no cure for this condition, but treatment can help you control your symptoms. Work closely with your health care  provider to find the most effective treatments for you. Treatment options may include:  Medicines to relieve pain and reduce how often you feel the need to urinate.  Learning ways to control when you urinate (bladder training).  Lifestyle changes, such as changing your diet or taking steps to control stress.  Using a device that provides electrical stimulation to your nerves, which can relieve pain (neuromodulation therapy). The device is placed on your back, where it blocks the nerves that cause you to feel pain in your bladder area.  A procedure that stretches your bladder by filling it with air or fluid.  Surgery. This is rare. It is only done for extreme cases, if other treatments do not help. Follow these instructions at home: Bladder training   Use bladder training techniques as directed. Techniques may include: ? Urinating at scheduled times. ? Training yourself to delay urination. ? Doing exercises (Kegel exercises) to strengthen the muscles that control urine flow.  Keep a bladder diary. ? Write down the times that you urinate and any symptoms that you have. This can help you find out which foods, liquids, or activities make your symptoms worse. ? Use your bladder diary to schedule bathroom trips. If you are away from home, plan to be near a bathroom at each of your scheduled times.  Make sure that you urinate just before you leave the house and just before you go to bed. Eating and drinking  Make dietary changes as recommended by your health care provider. You   may need to avoid: ? Spicy foods. ? Foods that contain a lot of potassium.  Limit your intake of beverages that make you need to urinate. These include: ? Caffeinated beverages like soda, coffee, and tea. ? Alcohol. General instructions  Take over-the-counter and prescription medicines only as told by your health care provider.  Do not drink alcohol.  You can try a warm or cool compress over your bladder for  comfort.  Avoid wearing tight clothing.  Do not use any products that contain nicotine or tobacco, such as cigarettes and e-cigarettes. If you need help quitting, ask your health care provider.  Keep all follow-up visits as told by your health care provider. This is important. Contact a health care provider if you have:  Symptoms that do not get better with treatment.  Pain or discomfort that gets worse.  More frequent urges to urinate.  A fever. Get help right away if:  You have no control over when you urinate. Summary  Interstitial cystitis is inflammation of the bladder.  This condition may cause pain in the bladder area as well as a frequent and urgent need to urinate.  You may have flare-ups of the condition, and then it may go away for a while. For many people, it becomes a long-term (chronic) problem.  There is no cure for interstitial cystitis, but treatment methods are available to control your symptoms. This information is not intended to replace advice given to you by your health care provider. Make sure you discuss any questions you have with your health care provider. Document Revised: 05/14/2017 Document Reviewed: 04/26/2017 Elsevier Patient Education  2020 [employer]  

## 2020-02-09 NOTE — Progress Notes (Signed)
Urological Symptom Review  Patient is experiencing the following symptoms: Frequent urination Hard to postpone urination Burning/pain with urination Get up at night to urinate Leakage of urine Kidney stones  Review of Systems  Gastrointestinal (upper)  : Negative for upper GI symptoms  Gastrointestinal (lower) : Negative for lower GI symptoms  Constitutional : Negative for symptoms  Skin: Negative for skin symptoms  Eyes: Negative for eye symptoms  Ear/Nose/Throat : Negative for Ear/Nose/Throat symptoms  Hematologic/Lymphatic: Negative for Hematologic/Lymphatic symptoms  Cardiovascular : Negative for cardiovascular symptoms  Respiratory : Negative for respiratory symptoms  Endocrine: Negative for endocrine symptoms  Musculoskeletal: Back pain Joint pain  Neurological: Negative for neurological symptoms  Psychologic: Depression Anxiety

## 2020-02-09 NOTE — Progress Notes (Signed)
02/09/2020 4:17 PM   Mildred A King 06-19-1970 [id number]  Referring provider: Elfredia Nevins, MD [address] [place],  [place] [address]  Chronic cystitis/IC  HPI: [name] is a 49yo here for followup for IC/chronic cystitis. She underwent hydrodistension on 01/15/2020. Since then she has noted dysuria and urinary frequency. She has dysuria daily for the past 6 months. She is following IC diet. She noted no relief of the dysuria after the hydrodistension UA today is concerning for infection. She has not tried IC medications. The urgency and frequency have improved since the hydrodistension.    PMH: Past Medical History:  Diagnosis Date  . Allergic rhinitis   . Anxiety   . Arthritis   . Bladder pain   . Complication of anesthesia   . Costochondritis    hx  . DDD (degenerative disc disease), lumbar   . Depression   . GERD (gastroesophageal reflux disease)   . Hepatic hemangioma 2006   stable  . History of adenomatous polyp of colon   . History of colitis   . History of gastritis   . History of kidney stones   . HTN (hypertension)   . Irritable bowel syndrome (IBS)   . Medullary sponge kidney    left  . Migraine   . Nephrolithiasis    bilateral-- nonobstructive per CT  . PONV (postoperative nausea and vomiting)   . Psoriatic arthritis (HCC)   . Psoriatic arthritis (HCC)   . Spinal stenosis   . Urgency of urination     Surgical History: Past Surgical History:  Procedure Laterality Date  . ANTERIOR CERVICAL DECOMP/DISCECTOMY FUSION  02/26/2014   C5 - C6; fusion with plate  . BIOPSY  01/04/2019   Procedure: BIOPSY;  Surgeon: Malissa Hippo, MD;  [facility];  Service: Endoscopy;;  gastric polyps  . CARPAL TUNNEL RELEASE Bilateral right 04-01-2010/  left 05-30-2010  . CHOLECYSTECTOMY N/A 07/14/2013   Procedure: LAPAROSCOPIC CHOLECYSTECTOMY;  Surgeon: Dalia Heading, MD;  Location: [facility];  Service: General;  Laterality: N/A;  . COLONOSCOPY   10-13-2010  . COLONOSCOPY N/A 01/04/2019   Procedure: COLONOSCOPY;  Surgeon: Malissa Hippo, MD;  [facility];  Service: Endoscopy;  Laterality: N/A;  . CYSTO WITH HYDRODISTENSION N/A 04/02/2015   Procedure: CYSTOSCOPY/HYDRODISTENSION, BLADDER BIOPSY WITH FULGURATION;  Surgeon: Bjorn Pippin, MD;  Location: [facility] [address];  Service: Urology;  Laterality: N/A;  . CYSTO WITH HYDRODISTENSION N/A 03/25/2018   Procedure: CYSTOSCOPY/HYDRODISTENSION, INSTILL MARCAIN AND PYRIDIUM;  Surgeon: Bjorn Pippin, MD;  Location: [facility];  Service: Urology;  Laterality: N/A;  . CYSTO WITH HYDRODISTENSION N/A 01/15/2020   Procedure: CYSTOSCOPY/HYDRODISTENSION;  Surgeon: Malen Gauze, MD;  Location: [facility];  Service: Urology;  Laterality: N/A;  . CYSTOSCOPY W/ URETERAL STENT PLACEMENT Bilateral 04/08/2016   Procedure: CYSTOSCOPY WITH BILATERAL RETROGRADE PYELOGRAM, BILATERAL URETERAL STENT PLACEMENT;  Surgeon: Malen Gauze, MD;  Location: [facility];  Service: Urology;  Laterality: Bilateral;  . CYSTOSCOPY W/ URETERAL STENT PLACEMENT Left 04/15/2016   Procedure: CYSTOSCOPY WITH RETROGRADE PYELOGRAM/URETERAL STENT PLACEMENT;  Surgeon: Malen Gauze, MD;  [facility]: [facility];  Service: Urology;  Laterality: Left;  . CYSTOSCOPY WITH BIOPSY N/A 01/15/2020   Procedure: CYSTOSCOPY WITH BLADDER BIOPSY AND FULGERATION;  Surgeon: Malen Gauze, MD;  [facility];  Service: Urology;  Laterality: N/A;  . CYSTOSCOPY WITH HOLMIUM LASER LITHOTRIPSY Right 04/08/2016   Procedure: CYSTOSCOPY WITH RIGHT RENAL STONE EXTRACTION WITH HOLMIUM LASER LITHOTRIPSY;  Surgeon:  Malen Gauze, MD;  Location: [facility];  Service: Urology;  Laterality: Right;  . CYSTOSCOPY WITH HOLMIUM LASER LITHOTRIPSY Left 04/15/2016   Procedure: CYSTOSCOPY WITH HOLMIUM LASER LITHOTRIPSY;  Surgeon: Malen Gauze, MD;  [facility];  Service: Urology;  Laterality: Left;  . CYSTOSCOPY WITH RETROGRADE PYELOGRAM,  URETEROSCOPY AND STENT PLACEMENT Left 07/05/2017   Procedure: CYSTOSCOPY WITH RETROGRADE PYELOGRAM, URETEROSCOPY AND STENT PLACEMENT;  Surgeon: Malen Gauze, MD;  Location: [facility];  Service: Urology;  Laterality: Left;  . DIAGNOSTIC LAPAROSCOPY    . DILATION AND CURETTAGE OF UTERUS    . DILITATION & CURRETTAGE/HYSTROSCOPY WITH THERMACHOICE ABLATION N/A 09/07/2012   Procedure: DILATATION & CURETTAGE/HYSTEROSCOPY WITH THERMACHOICE ABLATION;  Surgeon: Lazaro Arms, MD;  Location: [facility];  Service: Gynecology;  Laterality: N/A;  . ESOPHAGOGASTRODUODENOSCOPY  10/14/10  . ESOPHAGOGASTRODUODENOSCOPY N/A 01/04/2019   Procedure: ESOPHAGOGASTRODUODENOSCOPY (EGD);  Surgeon: Malissa Hippo, MD;  [facility];  Service: Endoscopy;  Laterality: N/A;  9:30  . EXTRACORPOREAL SHOCK WAVE LITHOTRIPSY  multiple  . HOLMIUM LASER APPLICATION Left 07/05/2017   Procedure: HOLMIUM LASER APPLICATION;  Surgeon: Malen Gauze, MD;  Location: [facility];  Service: Urology;  Laterality: Left;  . PERCUTANEOUS NEPHROSTOLITHOTOMY  2003  . PLANTAR FASCIA SURGERY Bilateral right 2008//  left 2010  . POLYPECTOMY  01/04/2019   Procedure: POLYPECTOMY;  Surgeon: Malissa Hippo, MD;  [facility];  Service: Endoscopy;;  colon  . STONE EXTRACTION WITH BASKET Left 04/15/2016   Procedure: STONE EXTRACTION WITH BASKET;  Surgeon: Malen Gauze, MD;  Location: [facility];  Service: Urology;  Laterality: Left;    Home Medications:  Allergies as of 02/09/2020      Reactions   Hydromorphone Itching   Prednisone Other (See Comments)   Increase anxiety symptoms, insomnia   Tramadol Hcl Other (See Comments)   dizziness   Sulfa Antibiotics Rash      Medication List       Accurate as of February 09, 2020  4:17 PM. If you have any questions, ask your nurse or doctor.        ALPRAZolam 0.5 MG tablet Commonly known as: XANAX Take 0.5 mg by mouth 3 (three) times daily as needed for anxiety.   Cosentyx (300  MG Dose) 150 MG/ML Sosy Generic drug: Secukinumab (300 MG Dose) Inject 300 mg into the skin every 30 (thirty) days.   DULoxetine 60 MG capsule Commonly known as: CYMBALTA Take 60 mg by mouth daily.   estradiol 1 MG tablet Commonly known as: ESTRACE TAKE 1 TABLET(1 MG) BY MOUTH DAILY What changed: See the new instructions.   fluticasone 50 MCG/ACT nasal spray Commonly known as: FLONASE Place 2 sprays into both nostrils daily.   Gemtesa 75 MG Tabs Generic drug: Vibegron Take 75 mg by mouth daily.   HYDROcodone-acetaminophen 10-325 MG tablet Commonly known as: NORCO Take 1 tablet by mouth 4 (four) times daily as needed for moderate pain.   ibuprofen 200 MG tablet Commonly known as: ADVIL Take 200 mg by mouth in the morning and at bedtime.   nitrofurantoin (macrocrystal-monohydrate) 100 MG capsule Commonly known as: MACROBID Take 100 mg by mouth at bedtime.   phenazopyridine 200 MG tablet Commonly known as: Pyridium Take 1 tablet (200 mg total) by mouth 3 (three) times daily as needed for pain. What changed: when to take this   potassium chloride SA 20 MEQ tablet Commonly known as: KLOR-CON Take 40 mEq by mouth 2 (two)  times daily.   progesterone 200 MG capsule Commonly known as: PROMETRIUM TAKE 1 CAPSULE(200 MG) BY MOUTH DAILY What changed: See the new instructions.   triamterene-hydrochlorothiazide 37.5-25 MG capsule Commonly known as: DYAZIDE Take 1 capsule by mouth daily.   Uribel 118 MG Caps Take 1 capsule (118 mg total) by mouth 2 (two) times daily as needed.       Allergies:  Allergies  Allergen Reactions  . Hydromorphone Itching  . Prednisone Other (See Comments)    Increase anxiety symptoms, insomnia  . Tramadol Hcl Other (See Comments)    dizziness  . Sulfa Antibiotics Rash    Family History: Family History  Problem Relation Age of Onset  . Irritable bowel syndrome Mother   . Scleroderma Mother   . Rheum arthritis Mother   . Asthma  Mother   . Melanoma Father   . Cancer Father        Melanoma  . Colon cancer Cousin 52       second cousin Nettie [place])  . Cancer Maternal Grandmother        Ovarian  . Diabetes Maternal Grandmother   . Thyroid disease Maternal Aunt     Social History:  reports that she has never smoked. She has never used smokeless tobacco. She reports that she does not drink alcohol and does not use drugs.  ROS: All other review of systems were reviewed and are negative except what is noted above in HPI  Physical Exam: BP 119/73   Pulse 73   Temp 98.2 F (36.8 C)   Ht 5\' 3"  (1.6 m)   Wt 164 lb 14.4 oz (74.8 kg)   BMI 29.21 kg/m   Constitutional:  Alert and oriented, No acute distress. HEENT: August AT, moist mucus membranes.  Trachea midline, no masses. Cardiovascular: No clubbing, cyanosis, or edema. Respiratory: Normal respiratory effort, no increased work of breathing. GI: Abdomen is soft, nontender, nondistended, no abdominal masses GU: No CVA tenderness.  Lymph: No cervical or inguinal lymphadenopathy. Skin: No rashes, bruises or suspicious lesions. Neurologic: Grossly intact, no focal deficits, moving all 4 extremities. Psychiatric: Normal mood and affect.  Laboratory Data: Lab Results  Component Value Date   WBC 7.0 03/21/2018   HGB 10.9 (L) 03/21/2018   HCT 35.3 (L) 03/21/2018   MCV 96.7 03/21/2018   PLT 330 03/21/2018    Lab Results  Component Value Date   CREATININE 0.76 01/12/2020    No results found for: PSA  No results found for: TESTOSTERONE  Lab Results  Component Value Date   HGBA1C 5.7 (H) 09/15/2011    Urinalysis    Component Value Date/Time   COLORURINE AMBER (A) 11/17/2019 1600   APPEARANCEUR HAZY (A) 11/17/2019 1600   LABSPEC 1.004 (L) 11/17/2019 1600   PHURINE 6.0 11/17/2019 1600   GLUCOSEU NEGATIVE 11/17/2019 1600   HGBUR SMALL (A) 11/17/2019 1600   BILIRUBINUR negative 01/28/2020 1324   BILIRUBINUR neg 12/25/2019 1349   KETONESUR  negative 01/28/2020 1324   KETONESUR NEGATIVE 11/17/2019 1600   PROTEINUR negative 01/28/2020 1324   PROTEINUR Positive (A) 12/25/2019 1349   PROTEINUR NEGATIVE 11/17/2019 1600   UROBILINOGEN 0.2 01/28/2020 1324   UROBILINOGEN 0.2 02/02/2013 1620   NITRITE Negative 01/28/2020 1324   NITRITE neg 12/25/2019 1349   NITRITE POSITIVE (A) 11/17/2019 1600   LEUKOCYTESUR Small (1+) (A) 01/28/2020 1324   LEUKOCYTESUR LARGE (A) 11/17/2019 1600    Lab Results  Component Value Date   BACTERIA RARE (A) 11/17/2019  Pertinent Imaging:  Results for orders placed during the hospital encounter of 11/17/19  DG Abd 1 View  Narrative CLINICAL DATA:  LOWER abdominal and bladder pain. Being treated for urinary tract infection. History of renal stones.  EXAM: ABDOMEN - 1 VIEW  COMPARISON:  11/11/2018  FINDINGS: Numerous intrarenal calculi are identified overlying the renal shadows bilaterally. Largest on the RIGHT measures approximately 5 millimeters. Largest on the LEFT measures approximately 4 millimeters. No stones identified along the expected location of the ureters. No stones within the pelvis. Bowel gas pattern is nonobstructive.  IMPRESSION: 1. Nonobstructive bowel gas pattern. 2. Nephrolithiasis.   Electronically Signed By: Norva Pavlov M.D. On: 11/19/2019 14:16  No results found for this or any previous visit.  No results found for this or any previous visit.  No results found for this or any previous visit.  Results for orders placed during the hospital encounter of 09/01/17  US RENAL  Narrative CLINICAL DATA:  Kidney stones  EXAM: RENAL / URINARY TRACT ULTRASOUND COMPLETE  COMPARISON:  Abdominal ultrasound of August 03, 2017  FINDINGS: Right Kidney:  Length: 10.7 cm. There is cortical thinning diffusely. The cortical echotexture remains lower than that of the adjacent liver. There is a nonobstructing mid/upper pole stone measuring approximately 7  mm in diameter. There is no hydronephrosis.  Left Kidney:  Length: 11 cm. There is diffuse cortical thinning similar to that on the right. There is a mid to upper pole stone measuring 6 mm in diameter. There is no hydronephrosis.  Bladder:  Appears normal for degree of bladder distention. Bilateral ureteral jets are observed.  IMPRESSION: Bilateral nonobstructing kidney stones. Diffuse renal cortical thinning. No hydronephrosis. Normal appearing urinary bladder.   Electronically Signed By: David  [place] M.D. On: 09/01/2017 10:23  No results found for this or any previous visit.  No results found for this or any previous visit.  No results found for this or any previous visit.   Assessment & Plan:    1. Chronic interstitial cystitis -urine for culture, we will start Vantin 200mg  BID for 7 days - Urinalysis, Routine w reflex microscopic -We will start elmiron 100mg  TD and hydroxzyine 10mg  qhs   No follow-ups on file.  Wilkie Aye, MD  [facility] [place]

## 2020-02-11 LAB — URINE CULTURE: Organism ID, Bacteria: NO GROWTH

## 2020-02-12 ENCOUNTER — Telehealth: Payer: Self-pay

## 2020-02-12 NOTE — Telephone Encounter (Signed)
See note

## 2020-02-21 DIAGNOSIS — G894 Chronic pain syndrome: Secondary | ICD-10-CM | POA: Diagnosis not present

## 2020-02-21 DIAGNOSIS — E6609 Other obesity due to excess calories: Secondary | ICD-10-CM | POA: Diagnosis not present

## 2020-02-21 DIAGNOSIS — R69 Illness, unspecified: Secondary | ICD-10-CM | POA: Diagnosis not present

## 2020-02-21 DIAGNOSIS — Z6830 Body mass index (BMI) 30.0-30.9, adult: Secondary | ICD-10-CM | POA: Diagnosis not present

## 2020-02-27 ENCOUNTER — Other Ambulatory Visit (HOSPITAL_COMMUNITY)
Admission: RE | Admit: 2020-02-27 | Discharge: 2020-02-27 | Disposition: A | Discharge status: Home / Self Care | Payer: Medicare HMO | Source: Ambulatory Visit | Attending: Adult Health | Admitting: Adult Health

## 2020-02-27 ENCOUNTER — Other Ambulatory Visit: Payer: Self-pay

## 2020-02-27 ENCOUNTER — Ambulatory Visit: Payer: Medicare HMO | Admitting: Adult Health

## 2020-02-27 ENCOUNTER — Encounter: Payer: Self-pay | Admitting: Adult Health

## 2020-02-27 VITALS — BP 131/75 | HR 78 | Ht 63.0 in | Wt 174.0 lb

## 2020-02-27 DIAGNOSIS — N949 Unspecified condition associated with female genital organs and menstrual cycle: Secondary | ICD-10-CM | POA: Insufficient documentation

## 2020-02-27 DIAGNOSIS — R300 Dysuria: Secondary | ICD-10-CM | POA: Diagnosis not present

## 2020-02-27 DIAGNOSIS — N898 Other specified noninflammatory disorders of vagina: Secondary | ICD-10-CM | POA: Insufficient documentation

## 2020-02-27 DIAGNOSIS — N3010 Interstitial cystitis (chronic) without hematuria: Secondary | ICD-10-CM

## 2020-02-27 LAB — POCT URINALYSIS DIPSTICK
Blood, UA: NEGATIVE
Glucose, UA: NEGATIVE
Ketones, UA: NEGATIVE
Leukocytes, UA: NEGATIVE
Nitrite, UA: NEGATIVE
Protein, UA: NEGATIVE

## 2020-02-27 MED ORDER — PREMARIN 0.625 MG/GM VA CREA
TOPICAL_CREAM | VAGINAL | 3 refills | Status: DC
Start: 2020-02-27 — End: 2021-04-10

## 2020-02-27 NOTE — Progress Notes (Signed)
Subjective:     Patient ID: Bethany King, female   DOB: 12/12/1970, 49 y.o.   MRN: [id number]  HPI Bethany King is a 49 year old white female, divorced, G0P0, sp ablation, in complaining of burning with urination and vaginal dryness. She has chronic IC and has had water bladder distention she said recently, and has vaginal burning and irritation too.  She is on HRT but stopped PVC. PCP is Dr Sherwood Gambler.   Review of Systems +burning with urination +vaginal dryness, has stopped PVC Vaginal burning and irritation Has same sex partner for 6 years, but they broke up for short while and back together  Reviewed past medical,surgical, social and family history. Reviewed medications and allergies.     Objective:   Physical Exam BP 131/75 (BP Location: Left Arm, Patient Position: Sitting, Cuff Size: Normal)    Pulse 78    Ht 5\' 3"  (1.6 m)    Wt 174 lb (78.9 kg)    BMI 30.82 kg/m urine dipstick is negative.   Skin warm and dry.Pelvic: external genitalia is normal in appearance no lesions, vagina is pink, with los of moisture and rugae,urethra has no lesions or masses noted, cervix:smooth and bulbous, uterus: normal size, shape and contour, non tender, no masses felt, adnexa: no masses or tenderness noted. Bladder is  tender and no masses felt. CV swab obtained.  Upstream - 02/27/20 0951      Pregnancy Intention Screening   Does the patient want to become pregnant in the next year? N/A   ablation   Does the patient's partner want to become pregnant in the next year? N/A    Would the patient like to discuss contraceptive options today? N/A      Contraception Wrap Up   Current Method --   ablation   End Method --   ablation   Contraception Counseling Provided No         Examination chaperoned by Nance Pear LPN     Assessment:     1. Burning with urination  2. Vaginal dryness Will add PVC back gave 2 sample tubes and discount card Meds ordered this encounter  Medications   conjugated  estrogens (PREMARIN) vaginal cream    Sig: Use 0.5 gm daily for 2 weeks then 2-3 x a week    Dispense:  42.5 g    Refill:  3    Order Specific Question:   Supervising Provider    Answer:   Despina Hidden, LUTHER H [[id number]]    3. Vaginal burning Will add PVC back CV swab sent for GC/CHL,trich,BV and yeast   4. Chronic interstitial cystitis Follow up with Dr Ronne Binning     Plan:     Return in 6 weeks for physical and ROS

## 2020-02-28 LAB — CERVICOVAGINAL ANCILLARY ONLY
Bacterial Vaginitis (gardnerella): POSITIVE — AB
Candida Glabrata: NEGATIVE
Candida Vaginitis: NEGATIVE
Chlamydia: NEGATIVE
Comment: NEGATIVE
Comment: NEGATIVE
Comment: NEGATIVE
Comment: NEGATIVE
Comment: NEGATIVE
Comment: NORMAL
Neisseria Gonorrhea: NEGATIVE
Trichomonas: NEGATIVE

## 2020-02-29 ENCOUNTER — Other Ambulatory Visit: Payer: Self-pay | Admitting: Adult Health

## 2020-02-29 MED ORDER — METRONIDAZOLE 500 MG PO TABS
500.0000 mg | ORAL_TABLET | Freq: Two times a day (BID) | ORAL | 0 refills | Status: DC
Start: 2020-02-29 — End: 2020-04-09

## 2020-02-29 NOTE — Progress Notes (Signed)
+  BV on CV swab will rx flagyl  

## 2020-03-01 ENCOUNTER — Ambulatory Visit: Payer: Medicare HMO | Admitting: Urology

## 2020-03-04 DIAGNOSIS — Z79899 Other long term (current) drug therapy: Secondary | ICD-10-CM | POA: Diagnosis not present

## 2020-03-04 DIAGNOSIS — M79643 Pain in unspecified hand: Secondary | ICD-10-CM | POA: Diagnosis not present

## 2020-03-04 DIAGNOSIS — L409 Psoriasis, unspecified: Secondary | ICD-10-CM | POA: Diagnosis not present

## 2020-03-04 DIAGNOSIS — N3010 Interstitial cystitis (chronic) without hematuria: Secondary | ICD-10-CM | POA: Diagnosis not present

## 2020-03-04 DIAGNOSIS — M25579 Pain in unspecified ankle and joints of unspecified foot: Secondary | ICD-10-CM | POA: Diagnosis not present

## 2020-03-04 DIAGNOSIS — L4050 Arthropathic psoriasis, unspecified: Secondary | ICD-10-CM | POA: Diagnosis not present

## 2020-03-04 DIAGNOSIS — M2550 Pain in unspecified joint: Secondary | ICD-10-CM | POA: Diagnosis not present

## 2020-03-04 DIAGNOSIS — M1990 Unspecified osteoarthritis, unspecified site: Secondary | ICD-10-CM | POA: Diagnosis not present

## 2020-03-18 ENCOUNTER — Other Ambulatory Visit: Payer: Self-pay | Admitting: Adult Health

## 2020-03-21 DIAGNOSIS — Z23 Encounter for immunization: Secondary | ICD-10-CM | POA: Diagnosis not present

## 2020-04-02 DIAGNOSIS — M1991 Primary osteoarthritis, unspecified site: Secondary | ICD-10-CM | POA: Diagnosis not present

## 2020-04-02 DIAGNOSIS — I10 Essential (primary) hypertension: Secondary | ICD-10-CM | POA: Diagnosis not present

## 2020-04-02 DIAGNOSIS — E559 Vitamin D deficiency, unspecified: Secondary | ICD-10-CM | POA: Diagnosis not present

## 2020-04-02 DIAGNOSIS — L4050 Arthropathic psoriasis, unspecified: Secondary | ICD-10-CM | POA: Diagnosis not present

## 2020-04-02 DIAGNOSIS — Z6830 Body mass index (BMI) 30.0-30.9, adult: Secondary | ICD-10-CM | POA: Diagnosis not present

## 2020-04-02 DIAGNOSIS — M5010 Cervical disc disorder with radiculopathy, unspecified cervical region: Secondary | ICD-10-CM | POA: Diagnosis not present

## 2020-04-02 DIAGNOSIS — R69 Illness, unspecified: Secondary | ICD-10-CM | POA: Diagnosis not present

## 2020-04-02 DIAGNOSIS — Z0000 Encounter for general adult medical examination without abnormal findings: Secondary | ICD-10-CM | POA: Diagnosis not present

## 2020-04-02 DIAGNOSIS — G894 Chronic pain syndrome: Secondary | ICD-10-CM | POA: Diagnosis not present

## 2020-04-04 DIAGNOSIS — E7849 Other hyperlipidemia: Secondary | ICD-10-CM | POA: Diagnosis not present

## 2020-04-04 DIAGNOSIS — E782 Mixed hyperlipidemia: Secondary | ICD-10-CM | POA: Diagnosis not present

## 2020-04-04 DIAGNOSIS — M5126 Other intervertebral disc displacement, lumbar region: Secondary | ICD-10-CM | POA: Diagnosis not present

## 2020-04-04 DIAGNOSIS — R69 Illness, unspecified: Secondary | ICD-10-CM | POA: Diagnosis not present

## 2020-04-04 DIAGNOSIS — Z1389 Encounter for screening for other disorder: Secondary | ICD-10-CM | POA: Diagnosis not present

## 2020-04-05 ENCOUNTER — Ambulatory Visit (INDEPENDENT_AMBULATORY_CARE_PROVIDER_SITE_OTHER): Payer: Medicare HMO | Admitting: Urology

## 2020-04-05 ENCOUNTER — Other Ambulatory Visit: Payer: Self-pay

## 2020-04-05 ENCOUNTER — Encounter: Payer: Self-pay | Admitting: Urology

## 2020-04-05 VITALS — BP 134/80 | HR 83 | Temp 98.9°F | Ht 63.0 in | Wt 174.0 lb

## 2020-04-05 DIAGNOSIS — N3010 Interstitial cystitis (chronic) without hematuria: Secondary | ICD-10-CM | POA: Diagnosis not present

## 2020-04-05 DIAGNOSIS — R102 Pelvic and perineal pain: Secondary | ICD-10-CM | POA: Insufficient documentation

## 2020-04-05 DIAGNOSIS — R3915 Urgency of urination: Secondary | ICD-10-CM | POA: Diagnosis not present

## 2020-04-05 MED ORDER — GEMTESA 75 MG PO TABS: 1.0000 | ORAL_TABLET | Freq: Every day | ORAL | 3 refills | Status: DC

## 2020-04-05 NOTE — Patient Instructions (Signed)
Interstitial Cystitis  Interstitial cystitis is inflammation of the bladder. This may cause pain in the bladder area as well as a frequent and urgent need to urinate. The bladder is a hollow organ in the lower part of the abdomen. It stores urine after the urine is made in the kidneys. The severity of interstitial cystitis can vary from person to person. You may have flare-ups, and then your symptoms may go away for a while. For many people, it becomes a long-term (chronic) problem. What are the causes? The cause of this condition is not known. What increases the risk? The following factors may make you more likely to develop this condition:  You are female.  You have fibromyalgia.  You have irritable bowel syndrome (IBS).  You have endometriosis. This condition may be aggravated by:  Stress.  Smoking.  Spicy foods. What are the signs or symptoms? Symptoms of interstitial cystitis vary, and they can change over time. Symptoms may include:  Discomfort or pain in the bladder area, which is in the lower abdomen. Pain can range from mild to severe. The pain may change in intensity as the bladder fills with urine or as it empties.  Pain in the pelvic area, between the hip bones.  An urgent need to urinate.  Frequent urination.  Pain during urination.  Pain during sex.  Blood in the urine. For women, symptoms often get worse during menstruation. How is this diagnosed? This condition is diagnosed based on your symptoms, your medical history, and a physical exam. You may have tests to rule out other conditions, such as:  Urine tests.  Cystoscopy. For this test, a tool similar to a very thin telescope is used to look into your bladder.  Biopsy. This involves taking a sample of tissue from the bladder to be examined under a microscope. How is this treated? There is no cure for this condition, but treatment can help you control your symptoms. Work closely with your health care  provider to find the most effective treatments for you. Treatment options may include:  Medicines to relieve pain and reduce how often you feel the need to urinate.  Learning ways to control when you urinate (bladder training).  Lifestyle changes, such as changing your diet or taking steps to control stress.  Using a device that provides electrical stimulation to your nerves, which can relieve pain (neuromodulation therapy). The device is placed on your back, where it blocks the nerves that cause you to feel pain in your bladder area.  A procedure that stretches your bladder by filling it with air or fluid.  Surgery. This is rare. It is only done for extreme cases, if other treatments do not help. Follow these instructions at home: Bladder training   Use bladder training techniques as directed. Techniques may include: ? Urinating at scheduled times. ? Training yourself to delay urination. ? Doing exercises (Kegel exercises) to strengthen the muscles that control urine flow.  Keep a bladder diary. ? Write down the times that you urinate and any symptoms that you have. This can help you find out which foods, liquids, or activities make your symptoms worse. ? Use your bladder diary to schedule bathroom trips. If you are away from home, plan to be near a bathroom at each of your scheduled times.  Make sure that you urinate just before you leave the house and just before you go to bed. Eating and drinking  Make dietary changes as recommended by your health care provider. You   may need to avoid: ? Spicy foods. ? Foods that contain a lot of potassium.  Limit your intake of beverages that make you need to urinate. These include: ? Caffeinated beverages like soda, coffee, and tea. ? Alcohol. General instructions  Take over-the-counter and prescription medicines only as told by your health care provider.  Do not drink alcohol.  You can try a warm or cool compress over your bladder for  comfort.  Avoid wearing tight clothing.  Do not use any products that contain nicotine or tobacco, such as cigarettes and e-cigarettes. If you need help quitting, ask your health care provider.  Keep all follow-up visits as told by your health care provider. This is important. Contact a health care provider if you have:  Symptoms that do not get better with treatment.  Pain or discomfort that gets worse.  More frequent urges to urinate.  A fever. Get help right away if:  You have no control over when you urinate. Summary  Interstitial cystitis is inflammation of the bladder.  This condition may cause pain in the bladder area as well as a frequent and urgent need to urinate.  You may have flare-ups of the condition, and then it may go away for a while. For many people, it becomes a long-term (chronic) problem.  There is no cure for interstitial cystitis, but treatment methods are available to control your symptoms. This information is not intended to replace advice given to you by your health care provider. Make sure you discuss any questions you have with your health care provider. Document Revised: 05/14/2017 Document Reviewed: 04/26/2017 Elsevier Patient Education  2020 [employer]  

## 2020-04-05 NOTE — Progress Notes (Signed)
Urological Symptom Review  Patient is experiencing the following symptoms: Frequent urination Hard to postpone urination Burning/pain with urination Get up at night to urinate Leakage of urine   Review of Systems  Gastrointestinal (upper)  : Negative for upper GI symptoms  Gastrointestinal (lower) : Negative for lower GI symptoms  Constitutional : Negative for symptoms  Skin: Itching  Eyes: Negative for eye symptoms  Ear/Nose/Throat : Negative for Ear/Nose/Throat symptoms  Hematologic/Lymphatic: Negative for Hematologic/Lymphatic symptoms  Cardiovascular : Negative for cardiovascular symptoms  Respiratory : Negative for respiratory symptoms  Endocrine: Negative for endocrine symptoms  Musculoskeletal: Back pain Joint pain  Neurological: Headaches  Psychologic: Depression Anxiety

## 2020-04-05 NOTE — Progress Notes (Signed)
04/05/2020 1:43 PM   Bethany King January 02, 1971 [id number]  Referring provider: Elfredia Nevins, MD [address] [place],  [place] [address]  Followup IC  HPI: Bethany King is a 49yo here for followup for IC and pelvic pain. She was started on atarax and elmiron last visit 8 weeks ok. She notes slight improvement in her pelvic pain and urinary frequency. She previously took gemtesa 75mg  which improved her urinary urgency and frequency   PMH: Past Medical History:  Diagnosis Date   Allergic rhinitis    Anxiety    Arthritis    Bladder pain    Complication of anesthesia    Costochondritis    hx   DDD (degenerative disc disease), lumbar    Depression    GERD (gastroesophageal reflux disease)    Hepatic hemangioma 2006   stable   History of adenomatous polyp of colon    History of colitis    History of gastritis    History of kidney stones    HTN (hypertension)    Irritable bowel syndrome (IBS)    Medullary sponge kidney    left   Migraine    Nephrolithiasis    bilateral-- nonobstructive per CT   PONV (postoperative nausea and vomiting)    Psoriatic arthritis (HCC)    Psoriatic arthritis (HCC)    Spinal stenosis    Urgency of urination     Surgical History: Past Surgical History:  Procedure Laterality Date   ANTERIOR CERVICAL DECOMP/DISCECTOMY FUSION  02/26/2014   C5 - C6; fusion with plate   BIOPSY  01/04/2019   Procedure: BIOPSY;  Surgeon: Malissa Hippo, MD;  [facility];  Service: Endoscopy;;  gastric polyps   CARPAL TUNNEL RELEASE Bilateral right 04-01-2010/  left 05-30-2010   CHOLECYSTECTOMY N/A 07/14/2013   Procedure: LAPAROSCOPIC CHOLECYSTECTOMY;  Surgeon: Dalia Heading, MD;  Location: [facility];  Service: General;  Laterality: N/A;   COLONOSCOPY  10-13-2010   COLONOSCOPY N/A 01/04/2019   Procedure: COLONOSCOPY;  Surgeon: Malissa Hippo, MD;  [facility];  Service: Endoscopy;  Laterality: N/A;     CYSTO WITH HYDRODISTENSION N/A 04/02/2015   Procedure: CYSTOSCOPY/HYDRODISTENSION, BLADDER BIOPSY WITH FULGURATION;  Surgeon: Bjorn Pippin, MD;  Location: [facility];  Service: Urology;  Laterality: N/A;   CYSTO WITH HYDRODISTENSION N/A 03/25/2018   Procedure: CYSTOSCOPY/HYDRODISTENSION, INSTILL MARCAIN AND PYRIDIUM;  Surgeon: Bjorn Pippin, MD;  Location: [facility];  Service: Urology;  Laterality: N/A;   CYSTO WITH HYDRODISTENSION N/A 01/15/2020   Procedure: CYSTOSCOPY/HYDRODISTENSION;  Surgeon: Malen Gauze, MD;  Location: [facility];  Service: Urology;  Laterality: N/A;   CYSTOSCOPY W/ URETERAL STENT PLACEMENT Bilateral 04/08/2016   Procedure: CYSTOSCOPY WITH BILATERAL RETROGRADE PYELOGRAM, BILATERAL URETERAL STENT PLACEMENT;  Surgeon: Malen Gauze, MD;  Location: [facility];  Service: Urology;  Laterality: Bilateral;   CYSTOSCOPY W/ URETERAL STENT PLACEMENT Left 04/15/2016   Procedure: CYSTOSCOPY WITH RETROGRADE PYELOGRAM/URETERAL STENT PLACEMENT;  Surgeon: Malen Gauze, MD;  Location: [facility];  Service: Urology;  Laterality: Left;   CYSTOSCOPY WITH BIOPSY N/A 01/15/2020   Procedure: CYSTOSCOPY WITH BLADDER BIOPSY AND FULGERATION;  Surgeon: Malen Gauze, MD;  [facility];  Service: Urology;  Laterality: N/A;   CYSTOSCOPY WITH HOLMIUM LASER LITHOTRIPSY Right 04/08/2016   Procedure: CYSTOSCOPY WITH RIGHT RENAL STONE EXTRACTION WITH HOLMIUM LASER LITHOTRIPSY;  Surgeon: Malen Gauze, MD;  Location: [facility];  Service: Urology;  Laterality: Right;   CYSTOSCOPY WITH HOLMIUM LASER LITHOTRIPSY Left  04/15/2016   Procedure: CYSTOSCOPY WITH HOLMIUM LASER LITHOTRIPSY;  Surgeon: Malen Gauze, MD;  Location: [facility];  Service: Urology;  Laterality: Left;   CYSTOSCOPY WITH RETROGRADE PYELOGRAM, URETEROSCOPY AND STENT PLACEMENT Left 07/05/2017   Procedure: CYSTOSCOPY WITH RETROGRADE PYELOGRAM, URETEROSCOPY AND STENT PLACEMENT;  Surgeon: Malen Gauze, MD;   Location: [facility];  Service: Urology;  Laterality: Left;   DIAGNOSTIC LAPAROSCOPY     DILATION AND CURETTAGE OF UTERUS     DILITATION & CURRETTAGE/HYSTROSCOPY WITH THERMACHOICE ABLATION N/A 09/07/2012   Procedure: DILATATION & CURETTAGE/HYSTEROSCOPY WITH THERMACHOICE ABLATION;  Surgeon: Lazaro Arms, MD;  Location: [facility];  Service: Gynecology;  Laterality: N/A;   ESOPHAGOGASTRODUODENOSCOPY  10/14/10   ESOPHAGOGASTRODUODENOSCOPY N/A 01/04/2019   Procedure: ESOPHAGOGASTRODUODENOSCOPY (EGD);  Surgeon: Malissa Hippo, MD;  [facility];  Service: Endoscopy;  Laterality: N/A;  9:30   EXTRACORPOREAL SHOCK WAVE LITHOTRIPSY  multiple   HOLMIUM LASER APPLICATION Left 07/05/2017   Procedure: HOLMIUM LASER APPLICATION;  Surgeon: Malen Gauze, MD;  Location: [facility];  Service: Urology;  Laterality: Left;   PERCUTANEOUS NEPHROSTOLITHOTOMY  2003   PLANTAR FASCIA SURGERY Bilateral right 2008//  left 2010   POLYPECTOMY  01/04/2019   Procedure: POLYPECTOMY;  Surgeon: Malissa Hippo, MD;  [facility];  Service: Endoscopy;;  colon   STONE EXTRACTION WITH BASKET Left 04/15/2016   Procedure: STONE EXTRACTION WITH BASKET;  Surgeon: Malen Gauze, MD;  Location: [facility];  Service: Urology;  Laterality: Left;    Home Medications:  Allergies as of 04/05/2020      Reactions   Hydromorphone Itching   Prednisone Other (See Comments)   Increase anxiety symptoms, insomnia   Tramadol Hcl Other (See Comments)   dizziness   Sulfa Antibiotics Rash      Medication List       Accurate as of April 05, 2020  1:43 PM. If you have any questions, ask your nurse or doctor.        ALPRAZolam 0.5 MG tablet Commonly known as: XANAX Take 0.5 mg by mouth 3 (three) times daily as needed for anxiety.   Cosentyx (300 MG Dose) 150 MG/ML Sosy Generic drug: Secukinumab (300 MG Dose) Inject 300 mg into the skin every 30 (thirty) days.   DULoxetine 60 MG capsule Commonly known as:  CYMBALTA Take 60 mg by mouth daily.   Elmiron 100 MG capsule Generic drug: pentosan polysulfate Take 1 capsule (100 mg total) by mouth 3 (three) times daily.   estradiol 1 MG tablet Commonly known as: ESTRACE TAKE 1 TABLET(1 MG) BY MOUTH DAILY   HYDROcodone-acetaminophen 10-325 MG tablet Commonly known as: NORCO Take 1 tablet by mouth 4 (four) times daily as needed for moderate pain.   hydrOXYzine 10 MG tablet Commonly known as: ATARAX/VISTARIL Take 1 tablet (10 mg total) by mouth at bedtime.   ibuprofen 200 MG tablet Commonly known as: ADVIL Take 200 mg by mouth in the morning and at bedtime.   methylPREDNISolone 4 MG Tbpk tablet Commonly known as: MEDROL DOSEPAK Take by mouth.   metroNIDAZOLE 500 MG tablet Commonly known as: FLAGYL Take 1 tablet (500 mg total) by mouth 2 (two) times daily.   potassium chloride SA 20 MEQ tablet Commonly known as: KLOR-CON Take 40 mEq by mouth 2 (two) times daily.   Premarin vaginal cream Generic drug: conjugated estrogens Use 0.5 gm daily for 2 weeks then 2-3 x a week   progesterone 200 MG capsule Commonly known as:  PROMETRIUM Take 1 capsule (200 mg total) by mouth at bedtime.   tiZANidine 4 MG tablet Commonly known as: ZANAFLEX Take by mouth.   triamterene-hydrochlorothiazide 37.5-25 MG capsule Commonly known as: DYAZIDE Take 1 capsule by mouth daily.       Allergies:  Allergies  Allergen Reactions   Hydromorphone Itching   Prednisone Other (See Comments)    Increase anxiety symptoms, insomnia   Tramadol Hcl Other (See Comments)    dizziness   Sulfa Antibiotics Rash    Family History: Family History  Problem Relation Age of Onset   Irritable bowel syndrome Mother    Scleroderma Mother    Rheum arthritis Mother    Asthma Mother    Melanoma Father    Cancer Father        Melanoma   Colon cancer Cousin 73       second cousin Nettie Elm Lovelace)   Cancer Maternal Grandmother        Ovarian    Diabetes Maternal Grandmother    Thyroid disease Maternal Aunt     Social History:  reports that she has never smoked. She has never used smokeless tobacco. She reports that she does not drink alcohol and does not use drugs.  ROS: All other review of systems were reviewed and are negative except what is noted above in HPI  Physical Exam: BP 134/80    Pulse 83    Temp 98.9 F (37.2 C)    Ht 5\' 3"  (1.6 m)    Wt 174 lb (78.9 kg)    BMI 30.82 kg/m   Constitutional:  Alert and oriented, No acute distress. HEENT: [place] AT, moist mucus membranes.  Trachea midline, no masses. Cardiovascular: No clubbing, cyanosis, or edema. Respiratory: Normal respiratory effort, no increased work of breathing. GI: Abdomen is soft, nontender, nondistended, no abdominal masses GU: No CVA tenderness.  Lymph: No cervical or inguinal lymphadenopathy. Skin: No rashes, bruises or suspicious lesions. Neurologic: Grossly intact, no focal deficits, moving all 4 extremities. Psychiatric: Normal mood and affect.  Laboratory Data: Lab Results  Component Value Date   WBC 7.0 03/21/2018   HGB 10.9 (L) 03/21/2018   HCT 35.3 (L) 03/21/2018   MCV 96.7 03/21/2018   PLT 330 03/21/2018    Lab Results  Component Value Date   CREATININE 0.76 01/12/2020    No results found for: PSA  No results found for: TESTOSTERONE  Lab Results  Component Value Date   HGBA1C 5.7 (H) 09/15/2011    Urinalysis    Component Value Date/Time   COLORURINE AMBER (A) 11/17/2019 1600   APPEARANCEUR Hazy (A) 02/09/2020 1620   LABSPEC 1.004 (L) 11/17/2019 1600   PHURINE 6.0 11/17/2019 1600   GLUCOSEU Negative 02/09/2020 1620   HGBUR SMALL (A) 11/17/2019 1600   BILIRUBINUR Negative 02/09/2020 1620   KETONESUR negative 01/28/2020 1324   KETONESUR NEGATIVE 11/17/2019 1600   PROTEINUR Negative 02/27/2020 1002   PROTEINUR Trace (A) 02/09/2020 1620   PROTEINUR NEGATIVE 11/17/2019 1600   UROBILINOGEN 0.2 01/28/2020 1324   UROBILINOGEN  0.2 02/02/2013 1620   NITRITE n 02/27/2020 1002   NITRITE Negative 02/09/2020 1620   NITRITE POSITIVE (A) 11/17/2019 1600   LEUKOCYTESUR Negative 02/27/2020 1002   LEUKOCYTESUR 1+ (A) 02/09/2020 1620   LEUKOCYTESUR LARGE (A) 11/17/2019 1600    Lab Results  Component Value Date   LABMICR See below: 02/09/2020   WBCUA 11-30 (A) 02/09/2020   LABEPIT >10 (A) 02/09/2020   BACTERIA Many (A) 02/09/2020  Pertinent Imaging:  Results for orders placed during the hospital encounter of 11/17/19  DG Abd 1 View  Narrative CLINICAL DATA:  LOWER abdominal and bladder pain. Being treated for urinary tract infection. History of renal stones.  EXAM: ABDOMEN - 1 VIEW  COMPARISON:  11/11/2018  FINDINGS: Numerous intrarenal calculi are identified overlying the renal shadows bilaterally. Largest on the RIGHT measures approximately 5 millimeters. Largest on the LEFT measures approximately 4 millimeters. No stones identified along the expected location of the ureters. No stones within the pelvis. Bowel gas pattern is nonobstructive.  IMPRESSION: 1. Nonobstructive bowel gas pattern. 2. Nephrolithiasis.   Electronically Signed By: Norva Pavlov M.D. On: 11/19/2019 14:16  No results found for this or any previous visit.  No results found for this or any previous visit.  No results found for this or any previous visit.  Results for orders placed during the hospital encounter of 09/01/17  US RENAL  Narrative CLINICAL DATA:  Kidney stones  EXAM: RENAL / URINARY TRACT ULTRASOUND COMPLETE  COMPARISON:  Abdominal ultrasound of August 03, 2017  FINDINGS: Right Kidney:  Length: 10.7 cm. There is cortical thinning diffusely. The cortical echotexture remains lower than that of the adjacent liver. There is a nonobstructing mid/upper pole stone measuring approximately 7 mm in diameter. There is no hydronephrosis.  Left Kidney:  Length: 11 cm. There is diffuse cortical  thinning similar to that on the right. There is a mid to upper pole stone measuring 6 mm in diameter. There is no hydronephrosis.  Bladder:  Appears normal for degree of bladder distention. Bilateral ureteral jets are observed.  IMPRESSION: Bilateral nonobstructing kidney stones. Diffuse renal cortical thinning. No hydronephrosis. Normal appearing urinary bladder.   Electronically Signed By: David  [place] M.D. On: 09/01/2017 10:23  No results found for this or any previous visit.  No results found for this or any previous visit.  No results found for this or any previous visit.   Assessment & Plan:    1. Chronic interstitial cystitis -Continue elmiron and hydroxyzine - Urinalysis, Routine w reflex microscopic  2. Pelvic pain in female -Continue IC diet  3. Urinary urgency -restart gemtesa   No follow-ups on file.  Wilkie Aye, MD  [facility]

## 2020-04-08 DIAGNOSIS — N3010 Interstitial cystitis (chronic) without hematuria: Secondary | ICD-10-CM | POA: Diagnosis not present

## 2020-04-08 LAB — URINALYSIS, ROUTINE W REFLEX MICROSCOPIC
Bilirubin, UA: NEGATIVE
Glucose, UA: NEGATIVE
Ketones, UA: NEGATIVE
Nitrite, UA: NEGATIVE
Protein,UA: NEGATIVE
Specific Gravity, UA: 1.01 (ref 1.005–1.030)
Urobilinogen, Ur: 0.2 mg/dL (ref 0.2–1.0)
pH, UA: 6 (ref 5.0–7.5)

## 2020-04-08 LAB — MICROSCOPIC EXAMINATION
RBC, Urine: NONE SEEN /hpf (ref 0–2)
Renal Epithel, UA: NONE SEEN /hpf

## 2020-04-09 ENCOUNTER — Ambulatory Visit (INDEPENDENT_AMBULATORY_CARE_PROVIDER_SITE_OTHER): Payer: Medicare HMO | Admitting: Adult Health

## 2020-04-09 ENCOUNTER — Encounter: Payer: Self-pay | Admitting: Adult Health

## 2020-04-09 VITALS — BP 123/73 | HR 73 | Ht 63.0 in | Wt 171.0 lb

## 2020-04-09 DIAGNOSIS — Z79890 Hormone replacement therapy (postmenopausal): Secondary | ICD-10-CM

## 2020-04-09 DIAGNOSIS — Z01419 Encounter for gynecological examination (general) (routine) without abnormal findings: Secondary | ICD-10-CM | POA: Insufficient documentation

## 2020-04-09 DIAGNOSIS — Z1211 Encounter for screening for malignant neoplasm of colon: Secondary | ICD-10-CM

## 2020-04-09 LAB — HEMOCCULT GUIAC POC 1CARD (OFFICE): Fecal Occult Blood, POC: NEGATIVE

## 2020-04-09 NOTE — Progress Notes (Signed)
Patient ID: Bethany King, female   DOB: 02/18/1971, 49 y.o.   MRN: [id number] History of Present Illness:  Bethany King is a 49 year old white female, divorced, PM in for a well woman gyn exam,she had a normal pap with negative HPV 04/04/2019. PCP is Dr Gerarda Fraction   Current Medications, Allergies, Past Medical History, Past Surgical History, Family History and Social History were reviewed in [facility] record.     Review of Systems: Patient denies any headaches, hearing loss, fatigue, blurred vision, shortness of breath, chest pain, abdominal pain, problems with bowel movements, urination,(has IC) or intercourse(not active). No joint pain or mood swings. Vaginal area feels better since using PVC   Physical Exam:BP 123/73 (BP Location: Left Arm, Patient Position: Sitting, Cuff Size: Normal)    Pulse 73    Ht 5\' 3"  (1.6 m)    Wt 171 lb (77.6 kg)    LMP 03/17/2020    BMI 30.29 kg/m  General:  Well developed, well nourished, no acute distress Skin:  Warm and dry Neck:  Midline trachea, normal thyroid, good ROM, no lymphadenopathy Lungs; Clear to auscultation bilaterally Breast:  No dominant palpable mass, retraction, or nipple discharge Cardiovascular: Regular rate and rhythm Abdomen:  Soft, non tender, no hepatosplenomegaly Pelvic:  External genitalia is normal in appearance, no lesions.  The vagina is pale with better moisture. Urethra has no lesions or masses. The cervix is smooth.  Uterus is felt to be normal size, shape, and contour.  No adnexal masses or tenderness noted.Bladder is non tender, no masses felt. Rectal: Good sphincter tone, no polyps, or hemorrhoids felt.  Hemoccult negative. Extremities/musculoskeletal:  No swelling or varicosities noted, no clubbing or cyanosis Psych:  No mood changes, alert and cooperative,seems happy AA is 0 Fall risk is low PHQ 9 score is 4, no SI  Upstream - 04/09/20 1346      Pregnancy Intention Screening   Does the patient  want to become pregnant in the next year? No    Does the patient's partner want to become pregnant in the next year? No    Would the patient like to discuss contraceptive options today? No      Contraception Wrap Up   Current Method --   ablation   End Method --   ablation   Contraception Counseling Provided No         Examination chaperoned by Genia Hotter RN  Impression and Plan: 1. Encounter for well woman exam with routine gynecological exam Physical in 1 year Pap in 2023 Mammogram yearly Labs with PCP  2. Encounter for screening fecal occult blood testing   3. Hormone replacement therapy (HRT) Continue PVC,estrace and Prometrium has refills

## 2020-05-22 ENCOUNTER — Ambulatory Visit (INDEPENDENT_AMBULATORY_CARE_PROVIDER_SITE_OTHER): Payer: Medicare HMO | Admitting: Urology

## 2020-05-22 ENCOUNTER — Other Ambulatory Visit: Payer: Self-pay

## 2020-05-22 ENCOUNTER — Encounter: Payer: Self-pay | Admitting: Urology

## 2020-05-22 VITALS — BP 124/70 | HR 71 | Temp 98.3°F | Ht 63.0 in | Wt 171.0 lb

## 2020-05-22 DIAGNOSIS — R102 Pelvic and perineal pain: Secondary | ICD-10-CM | POA: Diagnosis not present

## 2020-05-22 DIAGNOSIS — N3010 Interstitial cystitis (chronic) without hematuria: Secondary | ICD-10-CM

## 2020-05-22 LAB — URINALYSIS, ROUTINE W REFLEX MICROSCOPIC
Bilirubin, UA: NEGATIVE
Glucose, UA: NEGATIVE
Ketones, UA: NEGATIVE
Nitrite, UA: NEGATIVE
Protein,UA: NEGATIVE
Specific Gravity, UA: 1.01 (ref 1.005–1.030)
Urobilinogen, Ur: 0.2 mg/dL (ref 0.2–1.0)
pH, UA: 6.5 (ref 5.0–7.5)

## 2020-05-22 LAB — MICROSCOPIC EXAMINATION: Renal Epithel, UA: NONE SEEN /hpf

## 2020-05-22 MED ORDER — NITROFURANTOIN MONOHYD MACRO 100 MG PO CAPS
100.0000 mg | ORAL_CAPSULE | Freq: Two times a day (BID) | ORAL | 0 refills | Status: DC
Start: 2020-05-22 — End: 2020-06-11

## 2020-05-22 NOTE — Progress Notes (Signed)
05/22/2020 3:27 PM   Bethany King [birth year] [id number]  Referring provider: Elfredia Nevins, MD [address] [place],  [place] [address]  Pelvic pain and dysuria  HPI: Bethany King is a 49yo here with worsening pelvic pain and dysuria. Starting 14 days ago she noted worsening pelvic pain, suprapubic pressure, urinary frequency, urgency and dysuria. UA is concerning for infection. She is getting bladder spasms again which was managed with with Gemtesa.  No other associated symptoms. No exacerbating/alleviating events. The pelvic pain is sharp, constant, severe and nonraditing.    PMH: Past Medical History:  Diagnosis Date  . Allergic rhinitis   . Anxiety   . Arthritis   . Bladder pain   . Complication of anesthesia   . Costochondritis    hx  . DDD (degenerative disc disease), lumbar   . Depression   . GERD (gastroesophageal reflux disease)   . Hepatic hemangioma 2006   stable  . History of adenomatous polyp of colon   . History of colitis   . History of gastritis   . History of kidney stones   . HTN (hypertension)   . Irritable bowel syndrome (IBS)   . Medullary sponge kidney    left  . Migraine   . Nephrolithiasis    bilateral-- nonobstructive per CT  . PONV (postoperative nausea and vomiting)   . Psoriatic arthritis (HCC)   . Psoriatic arthritis (HCC)   . Spinal stenosis   . Urgency of urination     Surgical History: Past Surgical History:  Procedure Laterality Date  . ANTERIOR CERVICAL DECOMP/DISCECTOMY FUSION  02/26/2014   C5 - C6; fusion with plate  . BIOPSY  01/04/2019   Procedure: BIOPSY;  Surgeon: Malissa Hippo, MD;  [facility];  Service: Endoscopy;;  gastric polyps  . CARPAL TUNNEL RELEASE Bilateral right 04-01-2010/  left 05-30-2010  . CHOLECYSTECTOMY N/A 07/14/2013   Procedure: LAPAROSCOPIC CHOLECYSTECTOMY;  Surgeon: Dalia Heading, MD;  Location: [facility];  Service: General;  Laterality: N/A;  . COLONOSCOPY  10-13-2010  .  COLONOSCOPY N/A 01/04/2019   Procedure: COLONOSCOPY;  Surgeon: Malissa Hippo, MD;  [facility];  Service: Endoscopy;  Laterality: N/A;  . CYSTO WITH HYDRODISTENSION N/A 04/02/2015   Procedure: CYSTOSCOPY/HYDRODISTENSION, BLADDER BIOPSY WITH FULGURATION;  Surgeon: Bjorn Pippin, MD;  Location: [facility];  Service: Urology;  Laterality: N/A;  . CYSTO WITH HYDRODISTENSION N/A 03/25/2018   Procedure: CYSTOSCOPY/HYDRODISTENSION, INSTILL MARCAIN AND PYRIDIUM;  Surgeon: Bjorn Pippin, MD;  Location: [facility];  Service: Urology;  Laterality: N/A;  . CYSTO WITH HYDRODISTENSION N/A 01/15/2020   Procedure: CYSTOSCOPY/HYDRODISTENSION;  Surgeon: Malen Gauze, MD;  Location: [facility];  Service: Urology;  Laterality: N/A;  . CYSTOSCOPY W/ URETERAL STENT PLACEMENT Bilateral 04/08/2016   Procedure: CYSTOSCOPY WITH BILATERAL RETROGRADE PYELOGRAM, BILATERAL URETERAL STENT PLACEMENT;  Surgeon: Malen Gauze, MD;  Location: [facility];  Service: Urology;  Laterality: Bilateral;  . CYSTOSCOPY W/ URETERAL STENT PLACEMENT Left 04/15/2016   Procedure: CYSTOSCOPY WITH RETROGRADE PYELOGRAM/URETERAL STENT PLACEMENT;  Surgeon: Malen Gauze, MD;  Location: [facility];  Service: Urology;  Laterality: Left;  . CYSTOSCOPY WITH BIOPSY N/A 01/15/2020   Procedure: CYSTOSCOPY WITH BLADDER BIOPSY AND FULGERATION;  Surgeon: Malen Gauze, MD;  Location: [facility];  Service: Urology;  Laterality: N/A;  . CYSTOSCOPY WITH HOLMIUM LASER LITHOTRIPSY Right 04/08/2016   Procedure: CYSTOSCOPY WITH RIGHT RENAL STONE EXTRACTION WITH HOLMIUM LASER LITHOTRIPSY;  Surgeon: Malen Gauze, MD;  Location:  [facility];  Service: Urology;  Laterality: Right;  . CYSTOSCOPY WITH HOLMIUM LASER LITHOTRIPSY Left 04/15/2016   Procedure: CYSTOSCOPY WITH HOLMIUM LASER LITHOTRIPSY;  Surgeon: Malen Gauze, MD;  Location: [facility];  Service: Urology;  Laterality: Left;  . CYSTOSCOPY WITH RETROGRADE PYELOGRAM, URETEROSCOPY AND STENT  PLACEMENT Left 07/05/2017   Procedure: CYSTOSCOPY WITH RETROGRADE PYELOGRAM, URETEROSCOPY AND STENT PLACEMENT;  Surgeon: Malen Gauze, MD;  Location: [facility];  Service: Urology;  Laterality: Left;  . DIAGNOSTIC LAPAROSCOPY    . DILATION AND CURETTAGE OF UTERUS    . DILITATION & CURRETTAGE/HYSTROSCOPY WITH THERMACHOICE ABLATION N/A 09/07/2012   Procedure: DILATATION & CURETTAGE/HYSTEROSCOPY WITH THERMACHOICE ABLATION;  Surgeon: Lazaro Arms, MD;  Location: [facility];  Service: Gynecology;  Laterality: N/A;  . ESOPHAGOGASTRODUODENOSCOPY  10/14/10  . ESOPHAGOGASTRODUODENOSCOPY N/A 01/04/2019   Procedure: ESOPHAGOGASTRODUODENOSCOPY (EGD);  Surgeon: Malissa Hippo, MD;  [facility];  Service: Endoscopy;  Laterality: N/A;  9:30  . EXTRACORPOREAL SHOCK WAVE LITHOTRIPSY  multiple  . HOLMIUM LASER APPLICATION Left 07/05/2017   Procedure: HOLMIUM LASER APPLICATION;  Surgeon: Malen Gauze, MD;  Location: [facility];  Service: Urology;  Laterality: Left;  . PERCUTANEOUS NEPHROSTOLITHOTOMY  2003  . PLANTAR FASCIA SURGERY Bilateral right 2008//  left 2010  . POLYPECTOMY  01/04/2019   Procedure: POLYPECTOMY;  Surgeon: Malissa Hippo, MD;  [facility];  Service: Endoscopy;;  colon  . STONE EXTRACTION WITH BASKET Left 04/15/2016   Procedure: STONE EXTRACTION WITH BASKET;  Surgeon: Malen Gauze, MD;  Location: [facility];  Service: Urology;  Laterality: Left;    Home Medications:  Allergies as of 05/22/2020      Reactions   Hydromorphone Itching   Prednisone Other (See Comments)   Increase anxiety symptoms, insomnia   Tramadol Hcl Other (See Comments)   dizziness   Sulfa Antibiotics Rash      Medication List       Accurate as of May 22, 2020  3:27 PM. If you have any questions, ask your nurse or doctor.        ALPRAZolam 0.5 MG tablet Commonly known as: XANAX Take 0.5 mg by mouth 3 (three) times daily as needed for anxiety.   Cosentyx (300 MG Dose) 150 MG/ML  Sosy Generic drug: Secukinumab (300 MG Dose) Inject 300 mg into the skin every 30 (thirty) days.   DULoxetine 60 MG capsule Commonly known as: CYMBALTA Take 60 mg by mouth daily.   Elmiron 100 MG capsule Generic drug: pentosan polysulfate Take 1 capsule (100 mg total) by mouth 3 (three) times daily.   estradiol 1 MG tablet Commonly known as: ESTRACE TAKE 1 TABLET(1 MG) BY MOUTH DAILY   Gemtesa 75 MG Tabs Generic drug: Vibegron Take 1 capsule by mouth daily.   HYDROcodone-acetaminophen 10-325 MG tablet Commonly known as: NORCO Take 1 tablet by mouth 4 (four) times daily as needed for moderate pain.   hydrOXYzine 10 MG tablet Commonly known as: ATARAX/VISTARIL Take 1 tablet (10 mg total) by mouth at bedtime.   ibuprofen 200 MG tablet Commonly known as: ADVIL Take 200 mg by mouth in the morning and at bedtime.   potassium chloride SA 20 MEQ tablet Commonly known as: KLOR-CON Take 40 mEq by mouth 2 (two) times daily.   Premarin vaginal cream Generic drug: conjugated estrogens Use 0.5 gm daily for 2 weeks then 2-3 x a week   progesterone 200 MG capsule Commonly known as: PROMETRIUM Take 1 capsule (200 mg  total) by mouth at bedtime.   tiZANidine 4 MG tablet Commonly known as: ZANAFLEX Take by mouth.   triamterene-hydrochlorothiazide 37.5-25 MG capsule Commonly known as: DYAZIDE Take 1 capsule by mouth daily.       Allergies:  Allergies  Allergen Reactions  . Hydromorphone Itching  . Prednisone Other (See Comments)    Increase anxiety symptoms, insomnia  . Tramadol Hcl Other (See Comments)    dizziness  . Sulfa Antibiotics Rash    Family History: Family History  Problem Relation Age of Onset  . Irritable bowel syndrome Mother   . Scleroderma Mother   . Rheum arthritis Mother   . Asthma Mother   . Melanoma Father   . Cancer Father        Melanoma  . Colon cancer Cousin 77       second cousin Nettie Elm [place])  . Cancer Maternal Grandmother         Ovarian  . Diabetes Maternal Grandmother   . Thyroid disease Maternal Aunt     Social History:  reports that she has never smoked. She has never used smokeless tobacco. She reports that she does not drink alcohol and does not use drugs.  ROS: All other review of systems were reviewed and are negative except what is noted above in HPI  Physical Exam: BP 124/70   Pulse 71   Temp 98.3 F (36.8 C)   Ht 5\' 3"  (1.6 m)   Wt 171 lb (77.6 kg)   BMI 30.29 kg/m   Constitutional:  Alert and oriented, No acute distress. HEENT: [place] AT, moist mucus membranes.  Trachea midline, no masses. Cardiovascular: No clubbing, cyanosis, or edema. Respiratory: Normal respiratory effort, no increased work of breathing. GI: Abdomen is soft, nontender, nondistended, no abdominal masses GU: No CVA tenderness.  Lymph: No cervical or inguinal lymphadenopathy. Skin: No rashes, bruises or suspicious lesions. Neurologic: Grossly intact, no focal deficits, moving all 4 extremities. Psychiatric: Normal mood and affect.  Laboratory Data: Lab Results  Component Value Date   WBC 7.0 03/21/2018   HGB 10.9 (L) 03/21/2018   HCT 35.3 (L) 03/21/2018   MCV 96.7 03/21/2018   PLT 330 03/21/2018    Lab Results  Component Value Date   CREATININE 0.76 01/12/2020    No results found for: PSA  No results found for: TESTOSTERONE  Lab Results  Component Value Date   HGBA1C 5.7 (H) 09/15/2011    Urinalysis    Component Value Date/Time   COLORURINE AMBER (A) 11/17/2019 1600   APPEARANCEUR Clear 04/08/2020 1035   LABSPEC 1.004 (L) 11/17/2019 1600   PHURINE 6.0 11/17/2019 1600   GLUCOSEU Negative 04/08/2020 1035   HGBUR SMALL (A) 11/17/2019 1600   BILIRUBINUR Negative 04/08/2020 1035   KETONESUR negative 01/28/2020 1324   KETONESUR NEGATIVE 11/17/2019 1600   PROTEINUR Negative 04/08/2020 1035   PROTEINUR NEGATIVE 11/17/2019 1600   UROBILINOGEN 0.2 01/28/2020 1324   UROBILINOGEN 0.2 02/02/2013 1620   NITRITE  Negative 04/08/2020 1035   NITRITE POSITIVE (A) 11/17/2019 1600   LEUKOCYTESUR 2+ (A) 04/08/2020 1035   LEUKOCYTESUR LARGE (A) 11/17/2019 1600    Lab Results  Component Value Date   LABMICR See below: 04/08/2020   WBCUA 0-5 04/08/2020   LABEPIT 0-10 04/08/2020   BACTERIA Few (A) 04/08/2020    Pertinent Imaging:  Results for orders placed during the hospital encounter of 11/17/19  DG Abd 1 View  Narrative CLINICAL DATA:  LOWER abdominal and bladder pain. Being treated for urinary  tract infection. History of renal stones.  EXAM: ABDOMEN - 1 VIEW  COMPARISON:  11/11/2018  FINDINGS: Numerous intrarenal calculi are identified overlying the renal shadows bilaterally. Largest on the RIGHT measures approximately 5 millimeters. Largest on the LEFT measures approximately 4 millimeters. No stones identified along the expected location of the ureters. No stones within the pelvis. Bowel gas pattern is nonobstructive.  IMPRESSION: 1. Nonobstructive bowel gas pattern. 2. Nephrolithiasis.   Electronically Signed By: Norva Pavlov M.D. On: 11/19/2019 14:16  No results found for this or any previous visit.  No results found for this or any previous visit.  No results found for this or any previous visit.  Results for orders placed during the hospital encounter of 09/01/17  US RENAL  Narrative CLINICAL DATA:  Kidney stones  EXAM: RENAL / URINARY TRACT ULTRASOUND COMPLETE  COMPARISON:  Abdominal ultrasound of August 03, 2017  FINDINGS: Right Kidney:  Length: 10.7 cm. There is cortical thinning diffusely. The cortical echotexture remains lower than that of the adjacent liver. There is a nonobstructing mid/upper pole stone measuring approximately 7 mm in diameter. There is no hydronephrosis.  Left Kidney:  Length: 11 cm. There is diffuse cortical thinning similar to that on the right. There is a mid to upper pole stone measuring 6 mm in diameter. There is no  hydronephrosis.  Bladder:  Appears normal for degree of bladder distention. Bilateral ureteral jets are observed.  IMPRESSION: Bilateral nonobstructing kidney stones. Diffuse renal cortical thinning. No hydronephrosis. Normal appearing urinary bladder.   Electronically Signed By: David  [place] M.D. On: 09/01/2017 10:23  No results found for this or any previous visit.  No results found for this or any previous visit.  No results found for this or any previous visit.   Assessment & Plan:    1. Chronic interstitial cystitis -Continue elmiron, hydroxyzine, gemtesa  2. Pelvic pain in female -Urine for culture, will call with results -macrobid 100mg  BID for 7 days. We will start prophylaxis based on urine culture results.    No follow-ups on file.  Wilkie Aye, MD  [facility] [place]

## 2020-05-22 NOTE — Addendum Note (Signed)
Addended by: Valentina Lucks on: 05/22/2020 03:57 PM   Modules accepted: Orders

## 2020-05-22 NOTE — Patient Instructions (Signed)

## 2020-05-22 NOTE — Progress Notes (Signed)
Urological Symptom Review  Patient is experiencing the following symptoms: Frequent urination Hard to postpone urination Burning/pain with urination Get up at night to urinate Leakage of urine Kidney stones  Review of Systems  Gastrointestinal (upper)  : Negative for upper GI symptoms  Gastrointestinal (lower) : Negative for lower GI symptoms  Constitutional : Negative for symptoms  Skin: Negative for skin symptoms  Eyes: Blurred vision  Ear/Nose/Throat : Negative for Ear/Nose/Throat symptoms  Hematologic/Lymphatic: Negative for Hematologic/Lymphatic symptoms  Cardiovascular : Negative for cardiovascular symptoms  Respiratory : Negative for respiratory symptoms  Endocrine: Negative for endocrine symptoms  Musculoskeletal: Back pain Joint pain  Neurological: Negative for neurological symptoms  Psychologic: Depression Anxiety

## 2020-05-24 LAB — URINE CULTURE

## 2020-05-27 NOTE — Progress Notes (Signed)
Results sent via my chart 

## 2020-05-30 ENCOUNTER — Telehealth: Payer: Self-pay

## 2020-06-02 ENCOUNTER — Other Ambulatory Visit: Payer: Self-pay | Admitting: Urology

## 2020-06-03 DIAGNOSIS — N3010 Interstitial cystitis (chronic) without hematuria: Secondary | ICD-10-CM | POA: Diagnosis not present

## 2020-06-03 DIAGNOSIS — Z79899 Other long term (current) drug therapy: Secondary | ICD-10-CM | POA: Diagnosis not present

## 2020-06-03 DIAGNOSIS — L4050 Arthropathic psoriasis, unspecified: Secondary | ICD-10-CM | POA: Diagnosis not present

## 2020-06-03 DIAGNOSIS — M2550 Pain in unspecified joint: Secondary | ICD-10-CM | POA: Diagnosis not present

## 2020-06-03 DIAGNOSIS — M1990 Unspecified osteoarthritis, unspecified site: Secondary | ICD-10-CM | POA: Diagnosis not present

## 2020-06-03 DIAGNOSIS — L409 Psoriasis, unspecified: Secondary | ICD-10-CM | POA: Diagnosis not present

## 2020-06-03 DIAGNOSIS — M25579 Pain in unspecified ankle and joints of unspecified foot: Secondary | ICD-10-CM | POA: Diagnosis not present

## 2020-06-03 DIAGNOSIS — M79643 Pain in unspecified hand: Secondary | ICD-10-CM | POA: Diagnosis not present

## 2020-06-04 DIAGNOSIS — G894 Chronic pain syndrome: Secondary | ICD-10-CM | POA: Diagnosis not present

## 2020-06-04 DIAGNOSIS — I10 Essential (primary) hypertension: Secondary | ICD-10-CM | POA: Diagnosis not present

## 2020-06-04 DIAGNOSIS — E7849 Other hyperlipidemia: Secondary | ICD-10-CM | POA: Diagnosis not present

## 2020-06-04 DIAGNOSIS — Z6830 Body mass index (BMI) 30.0-30.9, adult: Secondary | ICD-10-CM | POA: Diagnosis not present

## 2020-06-06 NOTE — Telephone Encounter (Signed)
Yes. Nitrofurntoin 50mg  qhs

## 2020-06-11 ENCOUNTER — Other Ambulatory Visit: Payer: Self-pay

## 2020-06-11 DIAGNOSIS — Z87440 Personal history of urinary (tract) infections: Secondary | ICD-10-CM

## 2020-06-11 MED ORDER — NITROFURANTOIN MACROCRYSTAL 50 MG PO CAPS: 50.0000 mg | ORAL_CAPSULE | Freq: Every day | ORAL | 3 refills | Status: DC

## 2020-06-11 NOTE — Telephone Encounter (Signed)
Left message for pt to call back in the morning.

## 2020-06-12 NOTE — Telephone Encounter (Signed)
Pt made aware of rx sent to pharmacy

## 2020-06-15 ENCOUNTER — Other Ambulatory Visit: Payer: Self-pay | Admitting: Adult Health

## 2020-06-28 IMAGING — US ULTRASOUND ABDOMEN COMPLETE
1 series · 14 of 25 positions shown · non-contrast
Comparison: 03/13/2016

CLINICAL DATA: Postprandial pain for 2 months

EXAM:
ABDOMEN ULTRASOUND COMPLETE

[Series 1: ultrasound abdomen complete · 0.19mm/px · 14 of 89 slices shown]
[im 1/89]
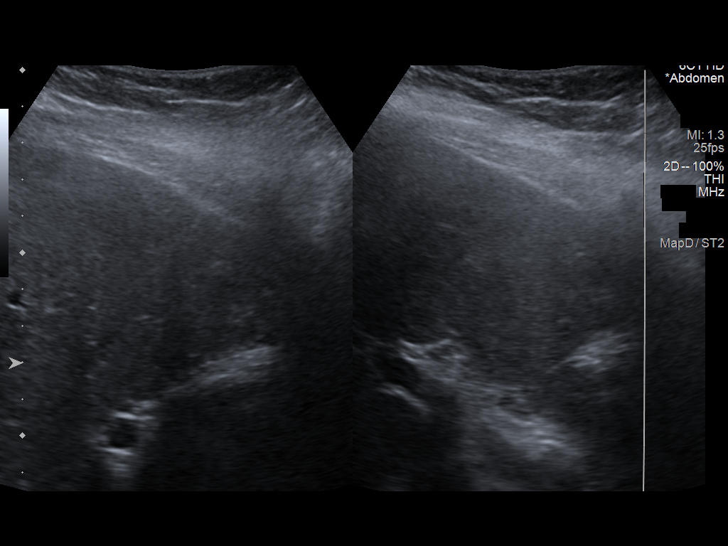
[im 8/89]
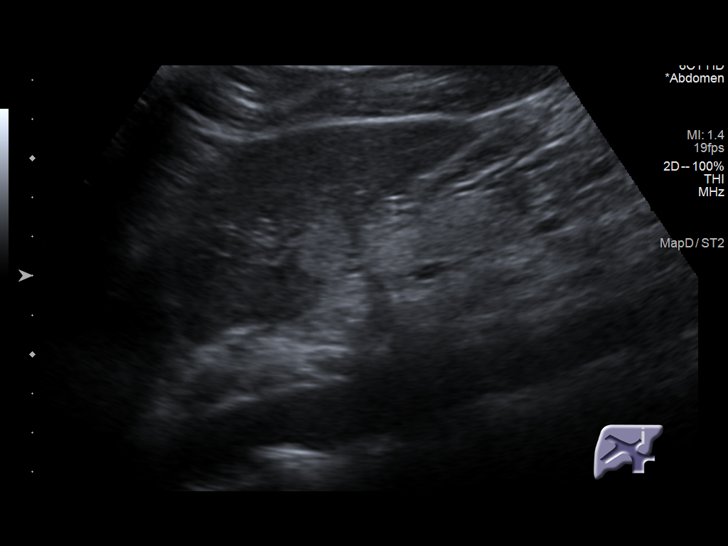
[im 15/89]
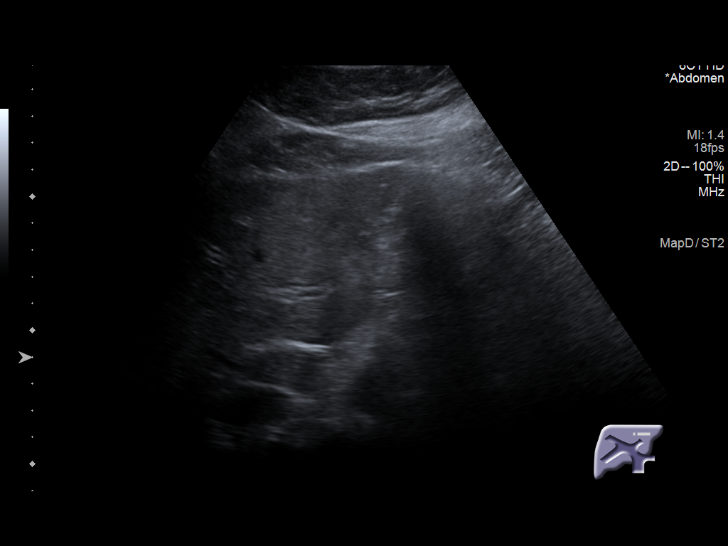
[im 23/89]
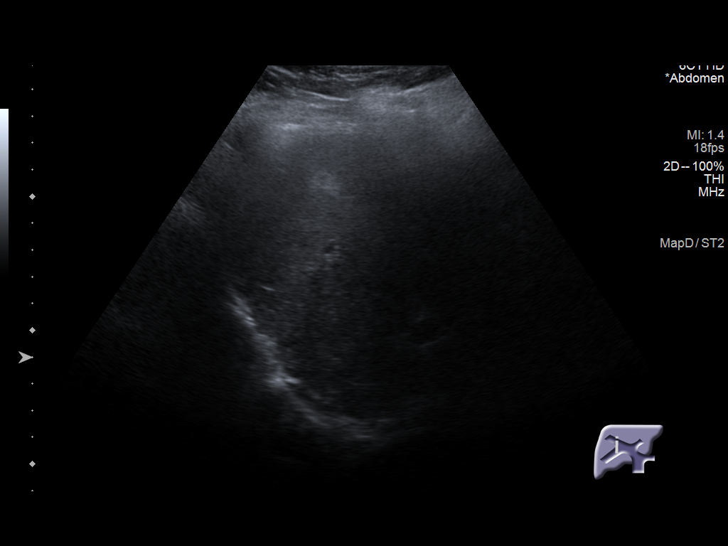
[im 30/89]
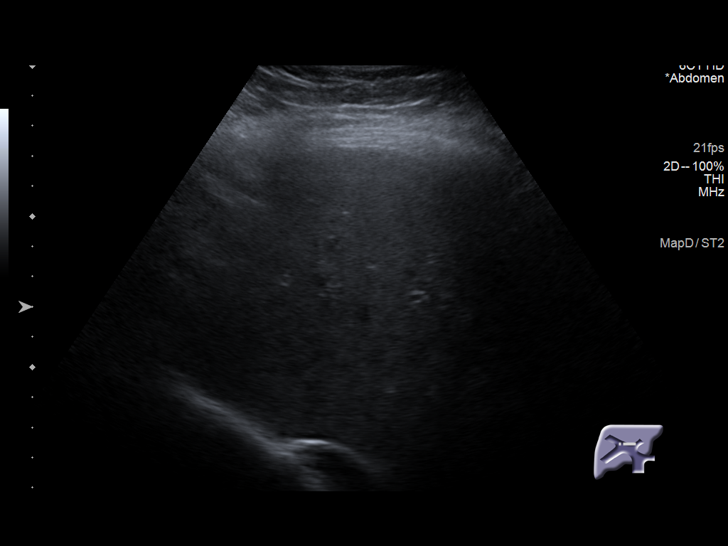
[im 34/89]
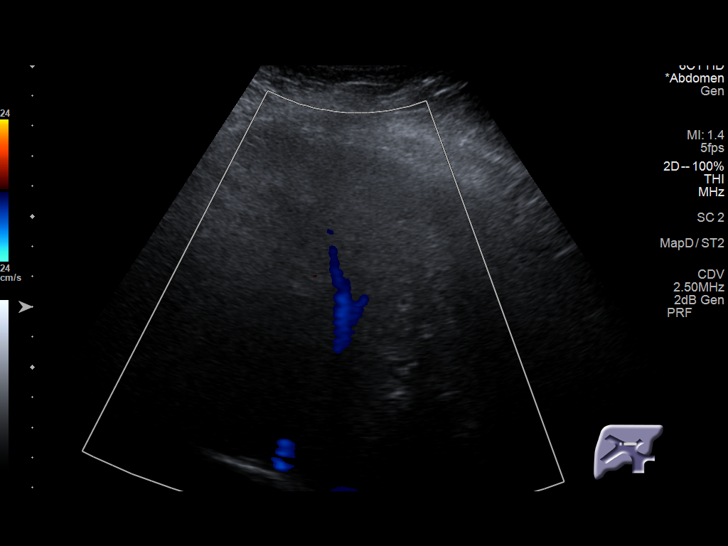
[im 41/89]
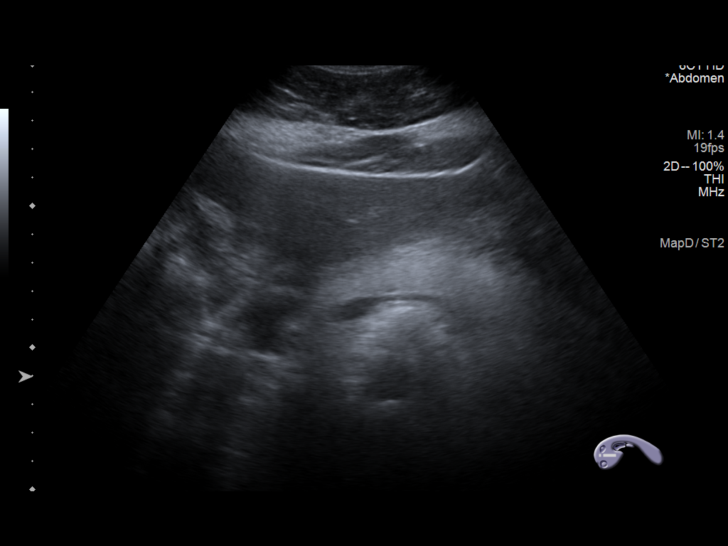
[im 48/89]
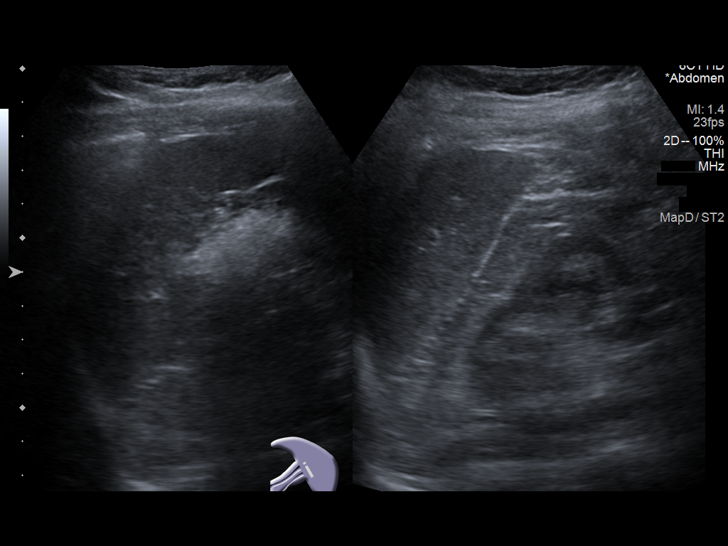
[im 56/89]
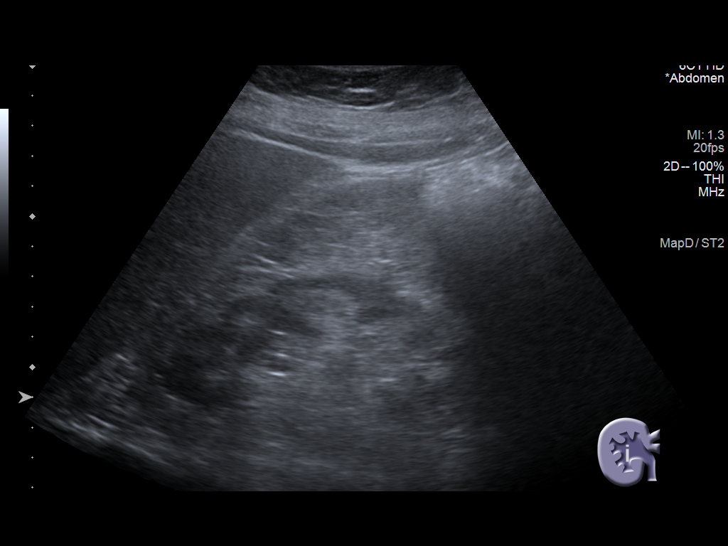
[im 59/89]
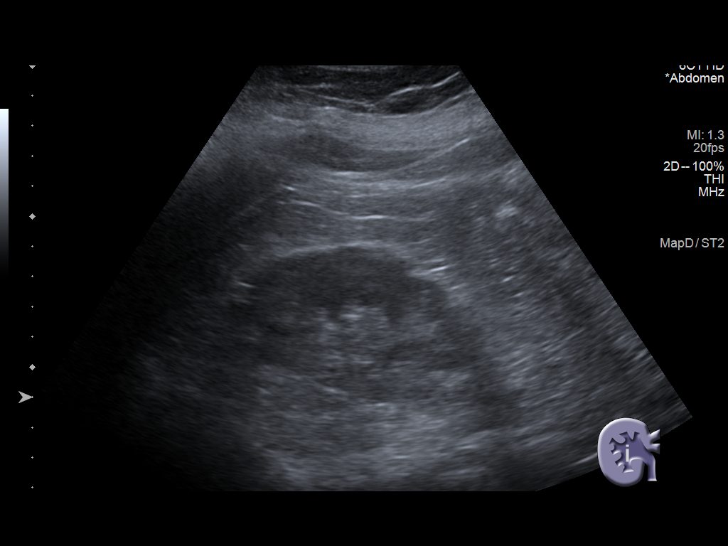
[im 67/89]
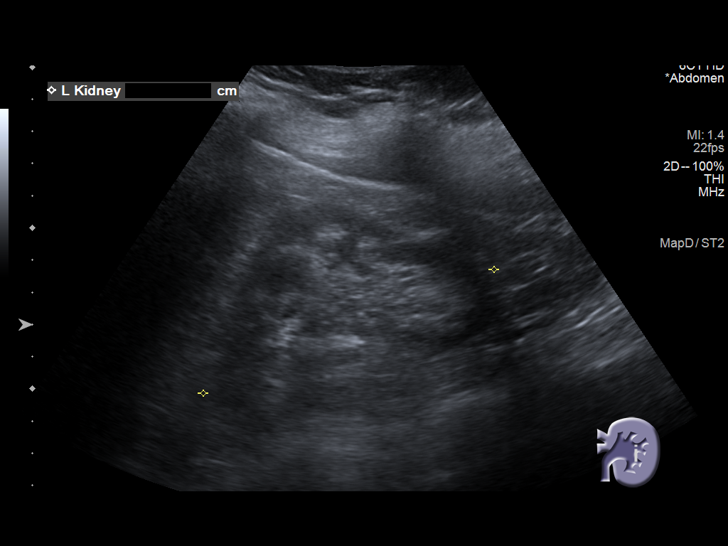
[im 74/89]
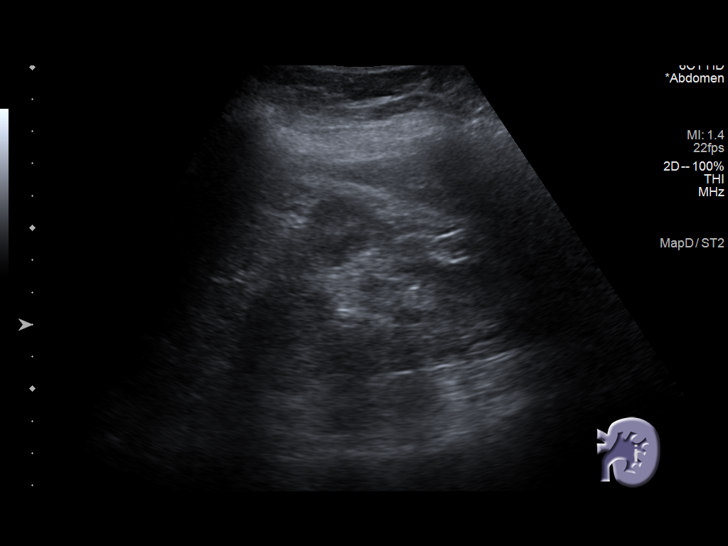
[im 81/89]
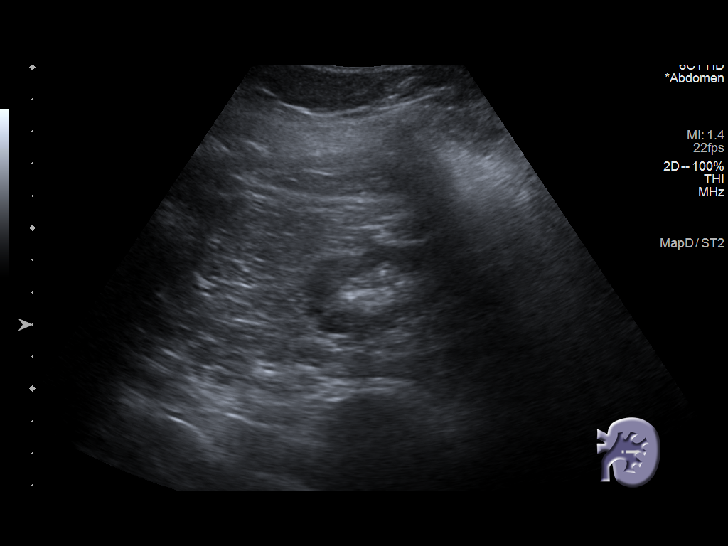
[im 89/89]
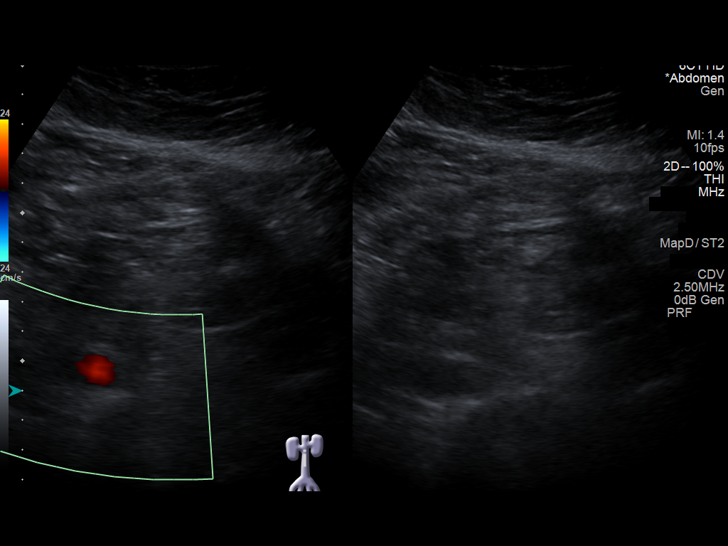

[14 of 25 positions shown; findings below may reference images not displayed]

FINDINGS: Gallbladder: Surgically removed

Common bile duct: Diameter: 3 mm

Liver: Diffusely increased in echogenicity consistent with fatty
infiltration. Focal hyperechoic lesions are noted in both the right
and left lobes measuring 1.6 cm on the right and 2.0 cm on the left.
These are consistent with hemangiomas. These appear stable from the
prior CT examination portal vein is patent on color Doppler imaging
with normal direction of blood flow towards the liver.

IVC: No abnormality visualized.

Pancreas: Visualized portion unremarkable.

Spleen: Size and appearance within normal limits.

Right Kidney: Length: 11 cm. Mild increased echogenicity and
cortical thinning.

Left Kidney: Length: 10.1 cm. Mild increased echogenicity and
cortical thinning.

Abdominal aorta: No aneurysm visualized.

Other findings: None.
IMPRESSION: Fatty infiltration of the liver.

Stable hemangiomas in the liver.

Mild changes of medical renal disease in the kidneys.

## 2020-07-03 ENCOUNTER — Other Ambulatory Visit: Payer: Self-pay | Admitting: Urology

## 2020-07-04 ENCOUNTER — Ambulatory Visit: Payer: Medicare HMO | Admitting: Urology

## 2020-07-05 ENCOUNTER — Other Ambulatory Visit: Payer: Self-pay | Admitting: Urology

## 2020-07-23 ENCOUNTER — Other Ambulatory Visit: Payer: Self-pay | Admitting: Adult Health

## 2020-07-29 ENCOUNTER — Ambulatory Visit: Payer: Medicare HMO | Admitting: Urology

## 2020-07-29 DIAGNOSIS — N3010 Interstitial cystitis (chronic) without hematuria: Secondary | ICD-10-CM

## 2020-08-08 DIAGNOSIS — M79643 Pain in unspecified hand: Secondary | ICD-10-CM | POA: Diagnosis not present

## 2020-08-08 DIAGNOSIS — M25579 Pain in unspecified ankle and joints of unspecified foot: Secondary | ICD-10-CM | POA: Diagnosis not present

## 2020-08-08 DIAGNOSIS — L4050 Arthropathic psoriasis, unspecified: Secondary | ICD-10-CM | POA: Diagnosis not present

## 2020-08-08 DIAGNOSIS — L409 Psoriasis, unspecified: Secondary | ICD-10-CM | POA: Diagnosis not present

## 2020-09-09 ENCOUNTER — Ambulatory Visit (INDEPENDENT_AMBULATORY_CARE_PROVIDER_SITE_OTHER): Payer: Medicare HMO | Admitting: Urology

## 2020-09-09 ENCOUNTER — Other Ambulatory Visit: Payer: Self-pay

## 2020-09-09 ENCOUNTER — Encounter: Payer: Self-pay | Admitting: Urology

## 2020-09-09 ENCOUNTER — Ambulatory Visit: Payer: Medicare HMO | Admitting: Urology

## 2020-09-09 VITALS — BP 122/72 | HR 73 | Temp 98.5°F | Ht 63.0 in | Wt 171.0 lb

## 2020-09-09 DIAGNOSIS — Z87440 Personal history of urinary (tract) infections: Secondary | ICD-10-CM | POA: Diagnosis not present

## 2020-09-09 DIAGNOSIS — N3010 Interstitial cystitis (chronic) without hematuria: Secondary | ICD-10-CM | POA: Diagnosis not present

## 2020-09-09 DIAGNOSIS — N3281 Overactive bladder: Secondary | ICD-10-CM | POA: Diagnosis not present

## 2020-09-09 LAB — URINALYSIS, ROUTINE W REFLEX MICROSCOPIC
Bilirubin, UA: NEGATIVE
Glucose, UA: NEGATIVE
Ketones, UA: NEGATIVE
Nitrite, UA: NEGATIVE
Specific Gravity, UA: 1.02 (ref 1.005–1.030)
Urobilinogen, Ur: 0.2 mg/dL (ref 0.2–1.0)
pH, UA: 6 (ref 5.0–7.5)

## 2020-09-09 LAB — MICROSCOPIC EXAMINATION
Epithelial Cells (non renal): >10 /hpf — AB (ref 0–10)
Renal Epithel, UA: NONE SEEN /hpf

## 2020-09-09 MED ORDER — NITROFURANTOIN MACROCRYSTAL 50 MG PO CAPS: 50.0000 mg | ORAL_CAPSULE | Freq: Every day | ORAL | 11 refills | Status: DC

## 2020-09-09 MED ORDER — ELMIRON 100 MG PO CAPS: 100.0000 mg | ORAL_CAPSULE | Freq: Two times a day (BID) | ORAL | 11 refills | Status: DC

## 2020-09-09 MED ORDER — HYDROXYZINE HCL 10 MG PO TABS: 10.0000 mg | ORAL_TABLET | Freq: Every day | ORAL | 11 refills | Status: DC

## 2020-09-09 MED ORDER — GEMTESA 75 MG PO TABS: 1.0000 | ORAL_TABLET | Freq: Every day | ORAL | 11 refills | Status: DC

## 2020-09-09 NOTE — Patient Instructions (Signed)
Interstitial Cystitis  Interstitial cystitis is inflammation of the bladder. This condition is also known as painful bladder syndrome. This may cause pain in the bladder area as well as a frequent and urgent need to urinate. The bladder is an organ that stores urine after the urine is made in the kidneys. The severity of interstitial cystitis can vary from person to person. You may have flare-ups, and then your symptoms may go away for a while. For many people, it becomes a long-term (chronic) problem. What are the causes? The cause of this condition is not known. What increases the risk? The following factors may make you more likely to develop this condition:  You are female.  You have fibromyalgia.  You have irritable bowel syndrome (IBS).  You have endometriosis. This condition may be aggravated by:  Stress.  Smoking.  Spicy foods. What are the signs or symptoms? Symptoms of interstitial cystitis vary, and they can change over time. Symptoms may include:  Discomfort or pain in the bladder area, which is in the lower abdomen. Pain can range from mild to severe. The pain may change in intensity as the bladder fills with urine or as it empties.  Pain in the pelvic area, between the hip bones.  An urgent need to urinate.  Frequent urination.  Pain during urination.  Pain during sex.  Blood in the urine.  Fatigue. For women, symptoms often get worse during menstruation. How is this diagnosed? This condition is diagnosed based on your symptoms, your medical history, and a physical exam. You may have tests to rule out other conditions, such as:  Urine tests.  Cystoscopy. For this test, a tool similar to a very thin telescope is used to look into your bladder.  Biopsy. This involves taking a sample of tissue from the bladder to be examined under a microscope. How is this treated? There is no cure for this condition, but treatment can help you control your symptoms. Work  closely with your health care provider to find the most effective treatments for you. Treatment options may include:  Medicines to relieve pain and reduce how often you feel the need to urinate. This treatment may include: ? A procedure where a small amount of medicine that eases irritation is put inside your bladder through a catheter (bladder instillation).  Lifestyle changes, such as changing your diet or taking steps to control stress.  Physical therapy. This may include: ? Exercises to help relax the pelvic floor muscles. ? Massage to relax tight muscles (myofascial release).  Learning ways to control when you urinate (bladder training).  Using a device that provides electrical stimulation to your nerves, which can relieve pain (neuromodulation therapy). The device is placed on your back, where it blocks the nerves that cause you to feel pain in your bladder area.  A procedure that stretches your bladder by filling it with air or fluid (hydrodistention).  Surgery. This is rare. It is only done for extreme cases, if other treatments do not help. Follow these instructions at home: Lifestyle  Learn and practice relaxation techniques, such as deep breathing and muscle relaxation.  Get care for your body and mental well-being, such as: ? Cognitive behavioral therapy (CBT). This therapy changes the way you think or act in response to the fatigue. This may help improve how you feel. ? Seeing a mental health therapist to evaluate and treat depression, if necessary.  Work with your health care provider on other ways to manage pain. Acupuncture may   be helpful.  Avoid drinking alcohol.  Do not use any products that contain nicotine or tobacco, such as cigarettes, e-cigarettes, and chewing tobacco. If you need help quitting, ask your health care provider. Eating and drinking  Make dietary changes as recommended by your health care provider. You may need to avoid: ? Spicy foods. ? Foods  that contain a lot of potassium.  Limit your intake of drinks that make you need to urinate. These include: ? Caffeinated drinks like soda, coffee, and tea. ? Alcohol. Bladder training  Use bladder training techniques as directed. Techniques may include: ? Urinating at scheduled times. ? Training yourself to delay urination.  Keep a bladder diary. ? Write down the times that you urinate and any symptoms that you have. This can help you find out which foods, liquids, or activities make your symptoms worse. ? Use your bladder diary to schedule bathroom trips. If you are away from home, plan to be near a bathroom at each of your scheduled times.  Make sure that you urinate just before you leave the house and just before you go to bed.   General instructions  Take over-the-counter and prescription medicines only as told by your health care provider.  You can try a warm or cool compress over your bladder for comfort.  Avoid wearing tight clothing.  Do exercises to relax your pelvic floor muscles as told by your physical therapist.  Keep all follow-up visits as told by your health care provider. This is important. Where to find more information To find more information or a support group near you, visit:  [employer]: urologyhealth.org  Interstitial Cystitis Association: ichelp.org Contact a health care provider if you have:  Symptoms that do not get better with treatment.  Pain or discomfort that gets worse.  More frequent urges to urinate.  A fever. Get help right away if:  You have no control over when you urinate. Summary  Interstitial cystitis is inflammation of the bladder.  This condition may cause pain in the bladder area as well as a frequent and urgent need to urinate.  You may have flare-ups of the condition, and then it may go away for a while. For many people, it becomes a long-term (chronic) problem.  There is no cure for interstitial cystitis,  but treatment methods are available to control your symptoms. This information is not intended to replace advice given to you by your health care provider. Make sure you discuss any questions you have with your health care provider. Document Revised: 07/19/2019 Document Reviewed: 04/26/2017 Elsevier Patient Education  2021 [employer]  

## 2020-09-09 NOTE — Progress Notes (Signed)
09/09/2020 1:37 PM   Bethany King Dec 18, 1970 [id number]  Referring provider: Elfredia Nevins, MD [address] [place],  [place] [address]  Followup IC and OAB  HPI: Ms Grajewski is a 49yo here for followup for IC and OAB. She is on gemtesa, elmiron, hydroxyzine and cymbalta. She notes significant improvement in her urinary urgency, frequency, and pelvic pressure. She denies any dysuria, hematuria. No othe rcomplaints today   PMH: Past Medical History:  Diagnosis Date  . Allergic rhinitis   . Anxiety   . Arthritis   . Bladder pain   . Complication of anesthesia   . Costochondritis    hx  . DDD (degenerative disc disease), lumbar   . Depression   . GERD (gastroesophageal reflux disease)   . Hepatic hemangioma 2006   stable  . History of adenomatous polyp of colon   . History of colitis   . History of gastritis   . History of kidney stones   . HTN (hypertension)   . Irritable bowel syndrome (IBS)   . Medullary sponge kidney    left  . Migraine   . Nephrolithiasis    bilateral-- nonobstructive per CT  . PONV (postoperative nausea and vomiting)   . Psoriatic arthritis (HCC)   . Psoriatic arthritis (HCC)   . Spinal stenosis   . Urgency of urination     Surgical History: Past Surgical History:  Procedure Laterality Date  . ANTERIOR CERVICAL DECOMP/DISCECTOMY FUSION  02/26/2014   C5 - C6; fusion with plate  . BIOPSY  01/04/2019   Procedure: BIOPSY;  Surgeon: Malissa Hippo, MD;  [facility];  Service: Endoscopy;;  gastric polyps  . CARPAL TUNNEL RELEASE Bilateral right 04-01-2010/  left 05-30-2010  . CHOLECYSTECTOMY N/A 07/14/2013   Procedure: LAPAROSCOPIC CHOLECYSTECTOMY;  Surgeon: Dalia Heading, MD;  Location: [facility];  Service: General;  Laterality: N/A;  . COLONOSCOPY  10-13-2010  . COLONOSCOPY N/A 01/04/2019   Procedure: COLONOSCOPY;  Surgeon: Malissa Hippo, MD;  [facility];  Service: Endoscopy;  Laterality: N/A;  . CYSTO  WITH HYDRODISTENSION N/A 04/02/2015   Procedure: CYSTOSCOPY/HYDRODISTENSION, BLADDER BIOPSY WITH FULGURATION;  Surgeon: Bjorn Pippin, MD;  Location[facility];  Service: Urology;  Laterality: N/A;  . CYSTO WITH HYDRODISTENSION N/A 03/25/2018   Procedure: CYSTOSCOPY/HYDRODISTENSION, INSTILL MARCAIN AND PYRIDIUM;  Surgeon: Bjorn Pippin, MD;  Location: [facility];  Service: Urology;  Laterality: N/A;  . CYSTO WITH HYDRODISTENSION N/A 01/15/2020   Procedure: CYSTOSCOPY/HYDRODISTENSION;  Surgeon: Malen Gauze, MD;  Location: [facility];  Service: Urology;  Laterality: N/A;  . CYSTOSCOPY W/ URETERAL STENT PLACEMENT Bilateral 04/08/2016   Procedure: CYSTOSCOPY WITH BILATERAL RETROGRADE PYELOGRAM, BILATERAL URETERAL STENT PLACEMENT;  Surgeon: Malen Gauze, MD;  Location: [facility];  Service: Urology;  Laterality: Bilateral;  . CYSTOSCOPY W/ URETERAL STENT PLACEMENT Left 04/15/2016   Procedure: CYSTOSCOPY WITH RETROGRADE PYELOGRAM/URETERAL STENT PLACEMENT;  Surgeon: Malen Gauze, MD;  Location: [facility];  Service: Urology;  Laterality: Left;  . CYSTOSCOPY WITH BIOPSY N/A 01/15/2020   Procedure: CYSTOSCOPY WITH BLADDER BIOPSY AND FULGERATION;  Surgeon: Malen Gauze, MD;  [facility]: [facility];  Service: Urology;  Laterality: N/A;  . CYSTOSCOPY WITH HOLMIUM LASER LITHOTRIPSY Right 04/08/2016   Procedure: CYSTOSCOPY WITH RIGHT RENAL STONE EXTRACTION WITH HOLMIUM LASER LITHOTRIPSY;  Surgeon: Malen Gauze, MD;  Location: [facility];  Service: Urology;  Laterality: Right;  . CYSTOSCOPY WITH HOLMIUM LASER LITHOTRIPSY Left 04/15/2016   Procedure: CYSTOSCOPY WITH  HOLMIUM LASER LITHOTRIPSY;  Surgeon: Malen Gauze, MD;  [facility]: [facility];  Service: Urology;  Laterality: Left;  . CYSTOSCOPY WITH RETROGRADE PYELOGRAM, URETEROSCOPY AND STENT PLACEMENT Left 07/05/2017   Procedure: CYSTOSCOPY WITH RETROGRADE PYELOGRAM, URETEROSCOPY AND STENT PLACEMENT;  Surgeon: Malen Gauze, MD;  Location: [facility];  Service: Urology;  Laterality: Left;  . DIAGNOSTIC LAPAROSCOPY    . DILATION AND CURETTAGE OF UTERUS    . DILITATION & CURRETTAGE/HYSTROSCOPY WITH THERMACHOICE ABLATION N/A 09/07/2012   Procedure: DILATATION & CURETTAGE/HYSTEROSCOPY WITH THERMACHOICE ABLATION;  Surgeon: Lazaro Arms, MD;  Location: [facility];  Service: Gynecology;  Laterality: N/A;  . ESOPHAGOGASTRODUODENOSCOPY  10/14/10  . ESOPHAGOGASTRODUODENOSCOPY N/A 01/04/2019   Procedure: ESOPHAGOGASTRODUODENOSCOPY (EGD);  Surgeon: Malissa Hippo, MD;  [facility];  Service: Endoscopy;  Laterality: N/A;  9:30  . EXTRACORPOREAL SHOCK WAVE LITHOTRIPSY  multiple  . HOLMIUM LASER APPLICATION Left 07/05/2017   Procedure: HOLMIUM LASER APPLICATION;  Surgeon: Malen Gauze, MD;  Location: [facility];  Service: Urology;  Laterality: Left;  . PERCUTANEOUS NEPHROSTOLITHOTOMY  2003  . PLANTAR FASCIA SURGERY Bilateral right 2008//  left 2010  . POLYPECTOMY  01/04/2019   Procedure: POLYPECTOMY;  Surgeon: Malissa Hippo, MD;  [facility];  Service: Endoscopy;;  colon  . STONE EXTRACTION WITH BASKET Left 04/15/2016   Procedure: STONE EXTRACTION WITH BASKET;  Surgeon: Malen Gauze, MD;  Location: [facility];  Service: Urology;  Laterality: Left;    Home Medications:  Allergies as of 09/09/2020      Reactions   Hydromorphone Itching   Prednisone Other (See Comments)   Increase anxiety symptoms, insomnia   Tramadol Hcl Other (See Comments)   dizziness   Sulfa Antibiotics Rash      Medication List       Accurate as of September 09, 2020  1:37 PM. If you have any questions, ask your nurse or doctor.        ALPRAZolam 0.5 MG tablet Commonly known as: XANAX Take 0.5 mg by mouth 3 (three) times daily as needed for anxiety.   Cosentyx (300 MG Dose) 150 MG/ML Sosy Generic drug: Secukinumab (300 MG Dose) Inject 300 mg into the skin every 30 (thirty) days.   DULoxetine 60 MG capsule Commonly known as: CYMBALTA Take  60 mg by mouth daily.   Elmiron 100 MG capsule Generic drug: pentosan polysulfate TAKE 1 CAPSULE(100 MG) BY MOUTH THREE TIMES DAILY   estradiol 1 MG tablet Commonly known as: ESTRACE TAKE 1 TABLET(1 MG) BY MOUTH DAILY   Gemtesa 75 MG Tabs Generic drug: Vibegron TAKE 1 TABLET BY MOUTH DAILY   HYDROcodone-acetaminophen 10-325 MG tablet Commonly known as: NORCO Take 1 tablet by mouth 4 (four) times daily as needed for moderate pain.   hydrOXYzine 10 MG tablet Commonly known as: ATARAX/VISTARIL TAKE 1 TABLET(10 MG) BY MOUTH AT BEDTIME   ibuprofen 200 MG tablet Commonly known as: ADVIL Take 200 mg by mouth in the morning and at bedtime.   nitrofurantoin 50 MG capsule Commonly known as: MACRODANTIN Take 1 capsule (50 mg total) by mouth at bedtime.   potassium chloride SA 20 MEQ tablet Commonly known as: KLOR-CON Take 40 mEq by mouth 2 (two) times daily.   Premarin vaginal cream Generic drug: conjugated estrogens Use 0.5 gm daily for 2 weeks then 2-3 x a week   progesterone 200 MG capsule Commonly known as: PROMETRIUM TAKE 1 CAPSULE(200 MG) BY MOUTH AT BEDTIME   tiZANidine 4  MG tablet Commonly known as: ZANAFLEX Take by mouth.   triamterene-hydrochlorothiazide 37.5-25 MG capsule Commonly known as: DYAZIDE Take 1 capsule by mouth daily.       Allergies:  Allergies  Allergen Reactions  . Hydromorphone Itching  . Prednisone Other (See Comments)    Increase anxiety symptoms, insomnia  . Tramadol Hcl Other (See Comments)    dizziness  . Sulfa Antibiotics Rash    Family History: Family History  Problem Relation Age of Onset  . Irritable bowel syndrome Mother   . Scleroderma Mother   . Rheum arthritis Mother   . Asthma Mother   . Melanoma Father   . Cancer Father        Melanoma  . Colon cancer Cousin 81       second cousin Nettie [place])  . Cancer Maternal Grandmother        Ovarian  . Diabetes Maternal Grandmother   . Thyroid disease Maternal Aunt      Social History:  reports that she has never smoked. She has never used smokeless tobacco. She reports that she does not drink alcohol and does not use drugs.  ROS: All other review of systems were reviewed and are negative except what is noted above in HPI  Physical Exam: BP 122/72   Pulse 73   Temp 98.5 F (36.9 C) (Oral)   Ht 5\' 3"  (1.6 m)   Wt 171 lb (77.6 kg)   BMI 30.29 kg/m   Constitutional:  Alert and oriented, No acute distress. HEENT: [place] AT, moist mucus membranes.  Trachea midline, no masses. Cardiovascular: No clubbing, cyanosis, or edema. Respiratory: Normal respiratory effort, no increased work of breathing. GI: Abdomen is soft, nontender, nondistended, no abdominal masses GU: No CVA tenderness.  Lymph: No cervical or inguinal lymphadenopathy. Skin: No rashes, bruises or suspicious lesions. Neurologic: Grossly intact, no focal deficits, moving all 4 extremities. Psychiatric: Normal mood and affect.  Laboratory Data: Lab Results  Component Value Date   WBC 7.0 03/21/2018   HGB 10.9 (L) 03/21/2018   HCT 35.3 (L) 03/21/2018   MCV 96.7 03/21/2018   PLT 330 03/21/2018    Lab Results  Component Value Date   CREATININE 0.76 01/12/2020    No results found for: PSA  No results found for: TESTOSTERONE  Lab Results  Component Value Date   HGBA1C 5.7 (H) 09/15/2011    Urinalysis    Component Value Date/Time   COLORURINE AMBER (A) 11/17/2019 1600   APPEARANCEUR Clear 05/22/2020 1631   LABSPEC 1.004 (L) 11/17/2019 1600   PHURINE 6.0 11/17/2019 1600   GLUCOSEU Negative 05/22/2020 1631   HGBUR SMALL (A) 11/17/2019 1600   BILIRUBINUR Negative 05/22/2020 1631   KETONESUR negative 01/28/2020 1324   KETONESUR NEGATIVE 11/17/2019 1600   PROTEINUR Negative 05/22/2020 1631   PROTEINUR NEGATIVE 11/17/2019 1600   UROBILINOGEN 0.2 01/28/2020 1324   UROBILINOGEN 0.2 02/02/2013 1620   NITRITE Negative 05/22/2020 1631   NITRITE POSITIVE (A) 11/17/2019 1600    LEUKOCYTESUR 2+ (A) 05/22/2020 1631   LEUKOCYTESUR LARGE (A) 11/17/2019 1600    Lab Results  Component Value Date   LABMICR See below: 05/22/2020   WBCUA 11-30 (A) 05/22/2020   LABEPIT 0-10 05/22/2020   BACTERIA Many (A) 05/22/2020    Pertinent Imaging:  Results for orders placed during the hospital encounter of 11/17/19  DG Abd 1 View  Narrative CLINICAL DATA:  LOWER abdominal and bladder pain. Being treated for urinary tract infection. History of renal stones.  EXAM: ABDOMEN - 1 VIEW  COMPARISON:  11/11/2018  FINDINGS: Numerous intrarenal calculi are identified overlying the renal shadows bilaterally. Largest on the RIGHT measures approximately 5 millimeters. Largest on the LEFT measures approximately 4 millimeters. No stones identified along the expected location of the ureters. No stones within the pelvis. Bowel gas pattern is nonobstructive.  IMPRESSION: 1. Nonobstructive bowel gas pattern. 2. Nephrolithiasis.   Electronically Signed By: Norva Pavlov M.D. On: 11/19/2019 14:16  No results found for this or any previous visit.  No results found for this or any previous visit.  No results found for this or any previous visit.  Results for orders placed during the hospital encounter of 09/01/17  US RENAL  Narrative CLINICAL DATA:  Kidney stones  EXAM: RENAL / URINARY TRACT ULTRASOUND COMPLETE  COMPARISON:  Abdominal ultrasound of August 03, 2017  FINDINGS: Right Kidney:  Length: 10.7 cm. There is cortical thinning diffusely. The cortical echotexture remains lower than that of the adjacent liver. There is a nonobstructing mid/upper pole stone measuring approximately 7 mm in diameter. There is no hydronephrosis.  Left Kidney:  Length: 11 cm. There is diffuse cortical thinning similar to that on the right. There is a mid to upper pole stone measuring 6 mm in diameter. There is no hydronephrosis.  Bladder:  Appears normal for degree of  bladder distention. Bilateral ureteral jets are observed.  IMPRESSION: Bilateral nonobstructing kidney stones. Diffuse renal cortical thinning. No hydronephrosis. Normal appearing urinary bladder.   Electronically Signed By: David  [place] M.D. On: 09/01/2017 10:23  No results found for this or any previous visit.  No results found for this or any previous visit.  No results found for this or any previous visit.   Assessment & Plan:    1. Chronic interstitial cystitis -continue elmiron, hydroxyzine and cymbalta - Urinalysis, Routine w reflex microscopic  2. Overactive bladder -continue gemtesa   No follow-ups on file.  Wilkie Aye, MD  [facility] [place]

## 2020-09-09 NOTE — Progress Notes (Signed)
Urological Symptom Review  Patient is experiencing the following symptoms: Frequent urination Hard to postpone urination Burning/Painful urination Get up at night to urinate Leakage of urine    Review of Systems  Gastrointestinal (upper)  : Negative for upper GI symptoms  Gastrointestinal (lower) : Negative for lower GI symptoms  Constitutional : Negative for symptoms  Skin: Negative for skin symptoms  Eyes: Blurred vision  Ear/Nose/Throat : Negative for Ear/Nose/Throat symptoms  Hematologic/Lymphatic: Negative for Hematologic/Lymphatic symptoms  Cardiovascular : Negative for cardiovascular symptoms  Respiratory : Negative for respiratory symptoms  Endocrine: Negative for endocrine symptoms  Musculoskeletal: Negative for musculoskeletal symptoms  Neurological: Negative for neurological symptoms  Psychologic: Negative for psychiatric symptoms

## 2020-09-19 DIAGNOSIS — I10 Essential (primary) hypertension: Secondary | ICD-10-CM | POA: Diagnosis not present

## 2020-09-19 DIAGNOSIS — Z6830 Body mass index (BMI) 30.0-30.9, adult: Secondary | ICD-10-CM | POA: Diagnosis not present

## 2020-09-19 DIAGNOSIS — R69 Illness, unspecified: Secondary | ICD-10-CM | POA: Diagnosis not present

## 2020-09-19 DIAGNOSIS — J309 Allergic rhinitis, unspecified: Secondary | ICD-10-CM | POA: Diagnosis not present

## 2020-09-19 DIAGNOSIS — G894 Chronic pain syndrome: Secondary | ICD-10-CM | POA: Diagnosis not present

## 2020-10-04 ENCOUNTER — Other Ambulatory Visit: Payer: Self-pay | Admitting: Urology

## 2020-10-04 DIAGNOSIS — Z87440 Personal history of urinary (tract) infections: Secondary | ICD-10-CM

## 2020-10-04 DIAGNOSIS — N3281 Overactive bladder: Secondary | ICD-10-CM

## 2020-10-23 ENCOUNTER — Other Ambulatory Visit: Payer: Self-pay | Admitting: Adult Health

## 2020-11-07 DIAGNOSIS — M25579 Pain in unspecified ankle and joints of unspecified foot: Secondary | ICD-10-CM | POA: Diagnosis not present

## 2020-11-07 DIAGNOSIS — L4050 Arthropathic psoriasis, unspecified: Secondary | ICD-10-CM | POA: Diagnosis not present

## 2020-11-07 DIAGNOSIS — L409 Psoriasis, unspecified: Secondary | ICD-10-CM | POA: Diagnosis not present

## 2020-11-07 DIAGNOSIS — M79643 Pain in unspecified hand: Secondary | ICD-10-CM | POA: Diagnosis not present

## 2020-11-24 ENCOUNTER — Ambulatory Visit
Admission: RE | Admit: 2020-11-24 | Discharge: 2020-11-24 | Disposition: A | Discharge status: Home / Self Care | Payer: Medicare HMO | Source: Ambulatory Visit | Attending: Emergency Medicine | Admitting: Emergency Medicine

## 2020-11-24 ENCOUNTER — Other Ambulatory Visit: Payer: Self-pay

## 2020-11-24 VITALS — BP 123/76 | HR 73 | Temp 98.7°F | Resp 16

## 2020-11-24 DIAGNOSIS — L4050 Arthropathic psoriasis, unspecified: Secondary | ICD-10-CM

## 2020-11-24 DIAGNOSIS — J0140 Acute pansinusitis, unspecified: Secondary | ICD-10-CM | POA: Diagnosis not present

## 2020-11-24 MED ORDER — METHYLPREDNISOLONE SODIUM SUCC 125 MG IJ SOLR
80.0000 mg | Freq: Once | INTRAMUSCULAR | Status: AC
  Administered 2020-11-24: 80 mg via INTRAMUSCULAR

## 2020-11-24 MED ORDER — AMOXICILLIN-POT CLAVULANATE 875-125 MG PO TABS
1.0000 | ORAL_TABLET | Freq: Two times a day (BID) | ORAL | 0 refills | Status: DC
Start: 2020-11-24 — End: 2021-01-28

## 2020-11-24 NOTE — ED Triage Notes (Signed)
States she is having an arthritis flare up.  Symptoms x 1 week.  Pain all over.

## 2020-11-24 NOTE — Discharge Instructions (Addendum)
Continue Mucinex and Flonase.  You may take 200-400 mg of motrin with 1000 mg of tylenol up to 3-4 times a day as needed for pain. This is an effective combination for pain.  Use a NeilMed sinus rinse with distilled water as often as you want to to reduce nasal congestion. Follow the directions on the box.  Finish the Augmentin, even if you feel better  Go to www.goodrx.com to look up your medications. This will give you a list of where you can find your prescriptions at the most affordable prices. Or you can ask the pharmacist what the cash price is. This is frequently cheaper than going through insurance.

## 2020-11-24 NOTE — ED Provider Notes (Signed)
HPI  SUBJECTIVE:  Bethany King is a 50 y.o. female who presents with 2 issues: First, she reports sinus pain and pressure, nasal congestion for the past 10 days.  She thought it was allergies at first.  No facial swelling, rhinorrhea, upper dental pain.  She has been using Flonase and Mucinex without much improvement in symptoms.  Symptoms are worse with lying down.  No antipyretic in the past 6 hours.  She is on Macrobid daily as a preventative for UTI.  No known COVID exposure.  She got the COVID booster.  She had a negative home COVID test 3 days ago.  Second, she reports 1 week of a psoriatic arthritis flare that is identical to previous flares.  She reports diffuse soreness, stiffness, pain.  She is currently on a DMARD.  She is not on prednisone on a regular basis.  She states that 80 mg of methylprednisolone usually works for these flares.  She tried ibuprofen 200 mg twice daily.  Symptoms are better with warm/hot showers and heating pads.  No aggravating factors.  She has a past medical history including psoriatic arthritis, gastritis, allergies, hypertension.  No history of diabetes, chronic kidney disease.  MGQ:QPYPP, Purcell Nails, MD     Past Medical History:  Diagnosis Date   Allergic rhinitis    Anxiety    Arthritis    Bladder pain    Complication of anesthesia    Costochondritis    hx   DDD (degenerative disc disease), lumbar    Depression    GERD (gastroesophageal reflux disease)    Hepatic hemangioma 2006   stable   History of adenomatous polyp of colon    History of colitis    History of gastritis    History of kidney stones    HTN (hypertension)    Irritable bowel syndrome (IBS)    Medullary sponge kidney    left   Migraine    Nephrolithiasis    bilateral-- nonobstructive per CT   PONV (postoperative nausea and vomiting)    Psoriatic arthritis (HCC)    Psoriatic arthritis (HCC)    Spinal stenosis    Urgency of urination     Past Surgical History:   Procedure Laterality Date   ANTERIOR CERVICAL DECOMP/DISCECTOMY FUSION  02/26/2014   C5 - C6; fusion with plate   BIOPSY  10/21/3265   Procedure: BIOPSY;  Surgeon: Rogene Houston, MD;  [facility];  Service: Endoscopy;;  gastric polyps   CARPAL TUNNEL RELEASE Bilateral right 04-01-2010/  left 05-30-2010   CHOLECYSTECTOMY N/A 07/14/2013   Procedure: LAPAROSCOPIC CHOLECYSTECTOMY;  Surgeon: Jamesetta So, MD;  [facility]: [facility];  Service: General;  Laterality: N/A;   COLONOSCOPY  10-13-2010   COLONOSCOPY N/A 01/04/2019   Procedure: COLONOSCOPY;  Surgeon: Rogene Houston, MD;  [facility];  Service: Endoscopy;  Laterality: N/A;   CYSTO WITH HYDRODISTENSION N/A 04/02/2015   Procedure: CYSTOSCOPY/HYDRODISTENSION, BLADDER BIOPSY WITH FULGURATION;  Surgeon: Irine Seal, MD;  Location: [facility];  Service: Urology;  Laterality: N/A;   CYSTO WITH HYDRODISTENSION N/A 03/25/2018   Procedure: CYSTOSCOPY/HYDRODISTENSION, INSTILL MARCAIN AND PYRIDIUM;  Surgeon: Irine Seal, MD;  [facility];  Service: Urology;  Laterality: N/A;   CYSTO WITH HYDRODISTENSION N/A 01/15/2020   Procedure: CYSTOSCOPY/HYDRODISTENSION;  Surgeon: Cleon Gustin, MD;  [facility];  Service: Urology;  Laterality: N/A;   CYSTOSCOPY W/ URETERAL STENT PLACEMENT Bilateral 04/08/2016   Procedure: CYSTOSCOPY WITH BILATERAL RETROGRADE PYELOGRAM, BILATERAL URETERAL  STENT PLACEMENT;  Surgeon: Cleon Gustin, MD;  [facility];  Service: Urology;  Laterality: Bilateral;   CYSTOSCOPY W/ URETERAL STENT PLACEMENT Left 04/15/2016   Procedure: CYSTOSCOPY WITH RETROGRADE PYELOGRAM/URETERAL STENT PLACEMENT;  Surgeon: Cleon Gustin, MD;  [facility];  Service: Urology;  Laterality: Left;   CYSTOSCOPY WITH BIOPSY N/A 01/15/2020   Procedure: CYSTOSCOPY WITH BLADDER BIOPSY AND FULGERATION;  Surgeon: Cleon Gustin, MD;  [facility];  Service: Urology;  Laterality: N/A;    CYSTOSCOPY WITH HOLMIUM LASER LITHOTRIPSY Right 04/08/2016   Procedure: CYSTOSCOPY WITH RIGHT RENAL STONE EXTRACTION WITH HOLMIUM LASER LITHOTRIPSY;  Surgeon: Cleon Gustin, MD;  [facility];  Service: Urology;  Laterality: Right;   CYSTOSCOPY WITH HOLMIUM LASER LITHOTRIPSY Left 04/15/2016   Procedure: CYSTOSCOPY WITH HOLMIUM LASER LITHOTRIPSY;  Surgeon: Cleon Gustin, MD;  [facility];  Service: Urology;  Laterality: Left;   CYSTOSCOPY WITH RETROGRADE PYELOGRAM, URETEROSCOPY AND STENT PLACEMENT Left 07/05/2017   Procedure: CYSTOSCOPY WITH RETROGRADE PYELOGRAM, URETEROSCOPY AND STENT PLACEMENT;  Surgeon: Cleon Gustin, MD;  [facility];  Service: Urology;  Laterality: Left;   DIAGNOSTIC LAPAROSCOPY     DILATION AND CURETTAGE OF UTERUS     DILITATION & CURRETTAGE/HYSTROSCOPY WITH THERMACHOICE ABLATION N/A 09/07/2012   Procedure: DILATATION & CURETTAGE/HYSTEROSCOPY WITH THERMACHOICE ABLATION;  Surgeon: Florian Buff, MD;  [facility];  Service: Gynecology;  Laterality: N/A;   ESOPHAGOGASTRODUODENOSCOPY  10/14/10   ESOPHAGOGASTRODUODENOSCOPY N/A 01/04/2019   Procedure: ESOPHAGOGASTRODUODENOSCOPY (EGD);  Surgeon: Rogene Houston, MD;  [facility];  Service: Endoscopy;  Laterality: N/A;  9:30   EXTRACORPOREAL SHOCK WAVE LITHOTRIPSY  multiple   HOLMIUM LASER APPLICATION Left 3/66/4403   Procedure: HOLMIUM LASER APPLICATION;  Surgeon: Cleon Gustin, MD;  [facility];  Service: Urology;  Laterality: Left;   PERCUTANEOUS NEPHROSTOLITHOTOMY  2003   PLANTAR FASCIA SURGERY Bilateral right 2008//  left 2010   POLYPECTOMY  01/04/2019   Procedure: POLYPECTOMY;  Surgeon: Rogene Houston, MD;  [facility];  Service: Endoscopy;;  colon   STONE EXTRACTION WITH BASKET Left 04/15/2016   Procedure: STONE EXTRACTION WITH BASKET;  Surgeon: Cleon Gustin, MD;  [facility];  Service: Urology;  Laterality: Left;    Family History  Problem  Relation Age of Onset   Irritable bowel syndrome Mother    Scleroderma Mother    Rheum arthritis Mother    Asthma Mother    Melanoma Father    Cancer Father        Melanoma   Colon cancer Cousin 39       second cousin Sunday Spillers Lovelace)   Cancer Maternal Grandmother        Ovarian   Diabetes Maternal Grandmother    Thyroid disease Maternal Aunt     Social History   Tobacco Use   Smoking status: Never   Smokeless tobacco: Never  Vaping Use   Vaping Use: Never used  Substance Use Topics   Alcohol use: No   Drug use: No    No current facility-administered medications for this encounter.  Current Outpatient Medications:    amoxicillin-clavulanate (AUGMENTIN) 875-125 MG tablet, Take 1 tablet by mouth 2 (two) times daily. X 7 days, Disp: 14 tablet, Rfl: 0   ALPRAZolam (XANAX) 0.5 MG tablet, Take 0.5 mg by mouth 3 (three) times daily as needed for anxiety. , Disp: , Rfl:    conjugated estrogens (PREMARIN) vaginal cream, Use 0.5 gm daily  for 2 weeks then 2-3 x a week, Disp: 42.5 g, Rfl: 3   COSENTYX, 300 MG DOSE, 150 MG/ML SOSY, Inject 300 mg into the skin every 30 (thirty) days., Disp: , Rfl:    DULoxetine (CYMBALTA) 60 MG capsule, Take 60 mg by mouth daily., Disp: , Rfl:    estradiol (ESTRACE) 1 MG tablet, TAKE 1 TABLET(1 MG) BY MOUTH DAILY, Disp: 30 tablet, Rfl: 3   HYDROcodone-acetaminophen (NORCO) 10-325 MG tablet, Take 1 tablet by mouth 4 (four) times daily as needed for moderate pain., Disp: 30 tablet, Rfl: 0   hydrOXYzine (ATARAX/VISTARIL) 10 MG tablet, Take 1 tablet (10 mg total) by mouth at bedtime., Disp: 30 tablet, Rfl: 11   ibuprofen (ADVIL) 200 MG tablet, Take 200 mg by mouth in the morning and at bedtime. , Disp: , Rfl:    pentosan polysulfate (ELMIRON) 100 MG capsule, Take 1 capsule (100 mg total) by mouth in the morning and at bedtime., Disp: 60 capsule, Rfl: 11   potassium chloride SA (K-DUR,KLOR-CON) 20 MEQ tablet, Take 40 mEq by mouth 2 (two) times daily. , Disp: ,  Rfl:    progesterone (PROMETRIUM) 200 MG capsule, TAKE 1 CAPSULE(200 MG) BY MOUTH AT BEDTIME, Disp: 30 capsule, Rfl: 6   tiZANidine (ZANAFLEX) 4 MG tablet, Take by mouth., Disp: , Rfl:    triamterene-hydrochlorothiazide (DYAZIDE) 37.5-25 MG capsule, Take 1 capsule by mouth daily. , Disp: , Rfl: 11   Vibegron (GEMTESA) 75 MG TABS, Take 1 tablet by mouth daily., Disp: 30 tablet, Rfl: 11  Allergies  Allergen Reactions   Hydromorphone Itching   Prednisone Other (See Comments)    Increase anxiety symptoms, insomnia   Tramadol Hcl Other (See Comments)    dizziness   Sulfa Antibiotics Rash     ROS  As noted in HPI.   Physical Exam  BP 123/76 (BP Location: Right Arm)   Pulse 73   Temp 98.7 F (37.1 C) (Oral)   Resp 16   SpO2 98%   Constitutional: Well developed, well nourished, no acute distress Eyes:  EOMI, conjunctiva normal bilaterally HENT: Normocephalic, atraumatic,mucus membranes moist.  Normal turbinates.  No appreciable nasal congestion.  Positive maxillary, frontal sinus tenderness.  No obvious postnasal drip Respiratory: Normal inspiratory effort Cardiovascular: Normal rate GI: nondistended skin: No rash, skin intact Musculoskeletal: no deformities Neurologic: Alert & oriented x 3, no focal neuro deficits Psychiatric: Speech and behavior appropriate   ED Course   Medications  methylPREDNISolone sodium succinate (SOLU-MEDROL) 125 mg/2 mL injection 80 mg (80 mg Intramuscular Given 11/24/20 1502)    No orders of the defined types were placed in this encounter.   No results found for this or any previous visit (from the past 24 hour(s)). No results found.  ED Clinical Impression  1. Acute non-recurrent pansinusitis   2. Psoriatic arthritis [facility])      ED Assessment/Plan  1.  Sinusitis.  Patient has had symptoms for 10 days.  We will have her continue Flonase, Mucinex, start saline nasal irrigation.  Home with Augmentin.  She may increase the frequency of her  ibuprofen and add Tylenol to it.  Did not check for COVID since symptoms started 10 days ago- she is out of the treatment window and she is no longer contagious.  2. psoriatic arthritis flare.  Patient states that 80 mg of methylprednisone usually works for her.  We will give her dose of 80 mg methylprednisone IM x1.  She will call her rheumatologist tomorrow.  Discussed MDM, treatment plan, and plan for follow-up with patient.  patient agrees with plan.   Meds ordered this encounter  Medications   methylPREDNISolone sodium succinate (SOLU-MEDROL) 125 mg/2 mL injection 80 mg   amoxicillin-clavulanate (AUGMENTIN) 875-125 MG tablet    Sig: Take 1 tablet by mouth 2 (two) times daily. X 7 days    Dispense:  14 tablet    Refill:  0      *This clinic note was created using [employer]. Therefore, there may be occasional mistakes despite careful proofreading.  ?    Melynda Ripple, MD 11/25/20 1130

## 2020-11-24 NOTE — ED Triage Notes (Signed)
Also states she is having sinus pressure x 1 week.

## 2020-12-01 ENCOUNTER — Other Ambulatory Visit: Payer: Self-pay | Admitting: Urology

## 2020-12-01 DIAGNOSIS — N3281 Overactive bladder: Secondary | ICD-10-CM

## 2020-12-11 DIAGNOSIS — E6609 Other obesity due to excess calories: Secondary | ICD-10-CM | POA: Diagnosis not present

## 2020-12-11 DIAGNOSIS — I10 Essential (primary) hypertension: Secondary | ICD-10-CM | POA: Diagnosis not present

## 2020-12-11 DIAGNOSIS — R69 Illness, unspecified: Secondary | ICD-10-CM | POA: Diagnosis not present

## 2020-12-11 DIAGNOSIS — L4050 Arthropathic psoriasis, unspecified: Secondary | ICD-10-CM | POA: Diagnosis not present

## 2020-12-11 DIAGNOSIS — F330 Major depressive disorder, recurrent, mild: Secondary | ICD-10-CM | POA: Diagnosis not present

## 2020-12-11 DIAGNOSIS — Z6830 Body mass index (BMI) 30.0-30.9, adult: Secondary | ICD-10-CM | POA: Diagnosis not present

## 2020-12-11 DIAGNOSIS — G894 Chronic pain syndrome: Secondary | ICD-10-CM | POA: Diagnosis not present

## 2021-01-26 ENCOUNTER — Ambulatory Visit
Admission: RE | Admit: 2021-01-26 | Discharge: 2021-01-26 | Disposition: A | Discharge status: Home / Self Care | Payer: Medicare HMO | Source: Ambulatory Visit | Attending: Family Medicine | Admitting: Family Medicine

## 2021-01-26 ENCOUNTER — Other Ambulatory Visit: Payer: Self-pay

## 2021-01-26 VITALS — BP 132/80 | HR 88 | Temp 98.8°F | Resp 16

## 2021-01-26 DIAGNOSIS — N390 Urinary tract infection, site not specified: Secondary | ICD-10-CM | POA: Insufficient documentation

## 2021-01-26 LAB — POCT URINALYSIS DIP (MANUAL ENTRY)
Bilirubin, UA: NEGATIVE
Glucose, UA: NEGATIVE mg/dL
Ketones, POC UA: NEGATIVE mg/dL
Nitrite, UA: NEGATIVE
Protein Ur, POC: NEGATIVE mg/dL
Spec Grav, UA: 1.01 (ref 1.010–1.025)
Urobilinogen, UA: 0.2 E.U./dL
pH, UA: 6 (ref 5.0–8.0)

## 2021-01-26 MED ORDER — NITROFURANTOIN MONOHYD MACRO 100 MG PO CAPS
100.0000 mg | ORAL_CAPSULE | Freq: Two times a day (BID) | ORAL | 0 refills | Status: DC
Start: 2021-01-26 — End: 2021-01-28

## 2021-01-26 NOTE — ED Provider Notes (Signed)
[facility]    CSN: [id number] Arrival date & time: 01/26/21  1039      History   Chief Complaint No chief complaint on file.   HPI Bethany King is a 50 y.o. female.   HPI Patient with a history of interstitial cystitis, nephrolithiasis, presents today for evaluation of UTI.  She reports experiencing 5 days urine frequency, dysuria, and left flank pain.  Reports current symptoms are worsening compared to her baseline.  She also has had some nausea and reports a fever today which she took 1000 mg of Tylenol prior to presenting here to urgent care.  Denies any vomiting , abdominal pain, or any right-sided flank pain.  Past Medical History:  Diagnosis Date   Allergic rhinitis    Anxiety    Arthritis    Bladder pain    Complication of anesthesia    Costochondritis    hx   DDD (degenerative disc disease), lumbar    Depression    GERD (gastroesophageal reflux disease)    Hepatic hemangioma 2006   stable   History of adenomatous polyp of colon    History of colitis    History of gastritis    History of kidney stones    HTN (hypertension)    Irritable bowel syndrome (IBS)    Medullary sponge kidney    left   Migraine    Nephrolithiasis    bilateral-- nonobstructive per CT   PONV (postoperative nausea and vomiting)    Psoriatic arthritis ([place])    Psoriatic arthritis ([place])    Spinal stenosis    Urgency of urination     Patient Active Problem List   Diagnosis Date Noted   Overactive bladder 09/09/2020   Encounter for screening fecal occult blood testing 04/09/2020   Encounter for well woman exam with routine gynecological exam 04/09/2020   Hormone replacement therapy (HRT) 04/09/2020   Pelvic pain in female 04/05/2020   Urinary urgency 04/05/2020   Vaginal dryness 02/27/2020   Burning with urination 02/27/2020   Vaginal burning 02/27/2020   Chronic interstitial cystitis 02/09/2020   Menopause 04/04/2019   Screening for colorectal cancer  04/04/2019   Encounter for gynecological examination with Papanicolaou smear of cervix 04/04/2019   Routine cervical smear 04/04/2019   Vaginal dryness, menopausal 04/04/2019   Hot flashes 04/04/2019   Psoriatic arthritis ([place]) 12/26/2018   History of colonic polyps 12/26/2018   Pain of upper abdomen 12/26/2018   Abdominal pain 06/23/2014   Acute gastroenteritis 06/23/2014   Back pain with radiation 10/24/2012   Breast mass 10/09/2012   Seasonal allergies 09/22/2012   Hypokalemia 09/22/2012   Dysmenorrhea 08/30/2012   Sciatica 05/29/2012   Sinusitis 03/02/2012   Back pain 02/10/2012   Menorrhagia 12/30/2011   Shoulder pain, left 12/03/2011   Costochondritis 11/17/2011   Shoulder bursitis 11/17/2011   Recurrent UTI 10/09/2011   Migraine headache 10/08/2011   Obesity 09/04/2011   HTN (hypertension) 07/14/2011   Anxiety 07/14/2011   Diarrhea 05/19/2011   Chest pain 04/30/2011   IBS (irritable bowel syndrome) 09/23/2010   Epigastric pain 09/23/2010   CONSTIPATION 01/29/2010   GERD 01/24/2010    Past Surgical History:  Procedure Laterality Date   ANTERIOR CERVICAL DECOMP/DISCECTOMY FUSION  02/26/2014   C5 - C6; fusion with plate   BIOPSY  [id number]   Procedure: BIOPSY;  Surgeon: Rogene Houston, MD;  [facility];  Service: Endoscopy;;  gastric polyps   CARPAL TUNNEL RELEASE Bilateral right 04-01-2010/  left  05-30-2010   CHOLECYSTECTOMY N/A 07/14/2013   Procedure: LAPAROSCOPIC CHOLECYSTECTOMY;  Surgeon: Jamesetta So, MD;  [facility];  Service: General;  Laterality: N/A;   COLONOSCOPY  10-13-2010   COLONOSCOPY N/A 01/04/2019   Procedure: COLONOSCOPY;  Surgeon: Rogene Houston, MD;  [facility];  Service: Endoscopy;  Laterality: N/A;   CYSTO WITH HYDRODISTENSION N/A 04/02/2015   Procedure: CYSTOSCOPY/HYDRODISTENSION, BLADDER BIOPSY WITH FULGURATION;  Surgeon: Irine Seal, MD;  Location: [facility];  Service: Urology;  Laterality:  N/A;   CYSTO WITH HYDRODISTENSION N/A 03/25/2018   Procedure: CYSTOSCOPY/HYDRODISTENSION, INSTILL MARCAIN AND PYRIDIUM;  Surgeon: Irine Seal, MD;  [facility];  Service: Urology;  Laterality: N/A;   CYSTO WITH HYDRODISTENSION N/A 01/15/2020   Procedure: CYSTOSCOPY/HYDRODISTENSION;  Surgeon: Cleon Gustin, MD;  [facility];  Service: Urology;  Laterality: N/A;   CYSTOSCOPY W/ URETERAL STENT PLACEMENT Bilateral 04/08/2016   Procedure: CYSTOSCOPY WITH BILATERAL RETROGRADE PYELOGRAM, BILATERAL URETERAL STENT PLACEMENT;  Surgeon: Cleon Gustin, MD;  [facility];  Service: Urology;  Laterality: Bilateral;   CYSTOSCOPY W/ URETERAL STENT PLACEMENT Left 04/15/2016   Procedure: CYSTOSCOPY WITH RETROGRADE PYELOGRAM/URETERAL STENT PLACEMENT;  Surgeon: Cleon Gustin, MD;  [facility];  Service: Urology;  Laterality: Left;   CYSTOSCOPY WITH BIOPSY N/A 01/15/2020   Procedure: CYSTOSCOPY WITH BLADDER BIOPSY AND FULGERATION;  Surgeon: Cleon Gustin, MD;  [facility];  Service: Urology;  Laterality: N/A;   CYSTOSCOPY WITH HOLMIUM LASER LITHOTRIPSY Right 04/08/2016   Procedure: CYSTOSCOPY WITH RIGHT RENAL STONE EXTRACTION WITH HOLMIUM LASER LITHOTRIPSY;  Surgeon: Cleon Gustin, MD;  [facility];  Service: Urology;  Laterality: Right;   CYSTOSCOPY WITH HOLMIUM LASER LITHOTRIPSY Left 04/15/2016   Procedure: CYSTOSCOPY WITH HOLMIUM LASER LITHOTRIPSY;  Surgeon: Cleon Gustin, MD;  [facility];  Service: Urology;  Laterality: Left;   CYSTOSCOPY WITH RETROGRADE PYELOGRAM, URETEROSCOPY AND STENT PLACEMENT Left 07/05/2017   Procedure: CYSTOSCOPY WITH RETROGRADE PYELOGRAM, URETEROSCOPY AND STENT PLACEMENT;  Surgeon: Cleon Gustin, MD;  [facility];  Service: Urology;  Laterality: Left;   DIAGNOSTIC LAPAROSCOPY     DILATION AND CURETTAGE OF UTERUS     DILITATION & CURRETTAGE/HYSTROSCOPY WITH THERMACHOICE ABLATION N/A 09/07/2012   Procedure: DILATATION &  CURETTAGE/HYSTEROSCOPY WITH THERMACHOICE ABLATION;  Surgeon: Florian Buff, MD;  [facility];  Service: Gynecology;  Laterality: N/A;   ESOPHAGOGASTRODUODENOSCOPY  10/14/10   ESOPHAGOGASTRODUODENOSCOPY N/A 01/04/2019   Procedure: ESOPHAGOGASTRODUODENOSCOPY (EGD);  Surgeon: Rogene Houston, MD;  [facility];  Service: Endoscopy;  Laterality: N/A;  9:30   EXTRACORPOREAL SHOCK WAVE LITHOTRIPSY  multiple   HOLMIUM LASER APPLICATION Left [id number]   Procedure: HOLMIUM LASER APPLICATION;  Surgeon: Cleon Gustin, MD;  [facility];  Service: Urology;  Laterality: Left;   PERCUTANEOUS NEPHROSTOLITHOTOMY  2003   PLANTAR FASCIA SURGERY Bilateral right 2008//  left 2010   POLYPECTOMY  01/04/2019   Procedure: POLYPECTOMY;  Surgeon: Rogene Houston, MD;  [facility];  Service: Endoscopy;;  colon   STONE EXTRACTION WITH BASKET Left 04/15/2016   Procedure: STONE EXTRACTION WITH BASKET;  Surgeon: Cleon Gustin, MD;  [facility];  Service: Urology;  Laterality: Left;    OB History     Gravida  0   Para      Term      Preterm      AB      Living  0  SAB      IAB      Ectopic      Multiple      Live Births               Home Medications    Prior to Admission medications   Medication Sig Start Date End Date Taking? Authorizing Provider  nitrofurantoin, macrocrystal-monohydrate, (MACROBID) 100 MG capsule Take 1 capsule (100 mg total) by mouth 2 (two) times daily for 7 days. 01/26/21 02/02/21 Yes Scot Jun, FNP  ALPRAZolam Duanne Moron) 0.5 MG tablet Take 0.5 mg by mouth 3 (three) times daily as needed for anxiety.  11/10/19   [provider]  amoxicillin-clavulanate (AUGMENTIN) 875-125 MG tablet Take 1 tablet by mouth 2 (two) times daily. X 7 days 11/24/20   Melynda Ripple, MD  conjugated estrogens (PREMARIN) vaginal cream Use 0.5 gm daily for 2 weeks then 2-3 x a week 02/27/20   Derrek Monaco A, NP  COSENTYX, 300 MG  DOSE, 150 MG/ML SOSY Inject 300 mg into the skin every 30 (thirty) days. 12/11/19   [provider]  DULoxetine (CYMBALTA) 60 MG capsule Take 60 mg by mouth daily.    [provider]  ELMIRON 100 MG capsule TAKE 1 CAPSULE(100 MG) BY MOUTH THREE TIMES DAILY 12/02/20   McKenzie, Candee Furbish, MD  estradiol (ESTRACE) 1 MG tablet TAKE 1 TABLET(1 MG) BY MOUTH DAILY 10/23/20   Estill Dooms, NP  HYDROcodone-acetaminophen (NORCO) 10-325 MG tablet Take 1 tablet by mouth 4 (four) times daily as needed for moderate pain. 01/15/20   McKenzie, Candee Furbish, MD  hydrOXYzine (ATARAX/VISTARIL) 10 MG tablet Take 1 tablet (10 mg total) by mouth at bedtime. 09/09/20   McKenzie, Candee Furbish, MD  ibuprofen (ADVIL) 200 MG tablet Take 200 mg by mouth in the morning and at bedtime.     [provider]  potassium chloride SA (K-DUR,KLOR-CON) 20 MEQ tablet Take 40 mEq by mouth 2 (two) times daily.     [provider]  progesterone (PROMETRIUM) 200 MG capsule TAKE 1 CAPSULE(200 MG) BY MOUTH AT BEDTIME 06/17/20   Estill Dooms, NP  tiZANidine (ZANAFLEX) 4 MG tablet Take by mouth. 04/02/20   [provider]  triamterene-hydrochlorothiazide (DYAZIDE) 37.5-25 MG capsule Take 1 capsule by mouth daily.  01/09/18   [provider]  Vibegron (GEMTESA) 75 MG TABS Take 1 tablet by mouth daily. 09/09/20   McKenzie, Candee Furbish, MD  Adalimumab (HUMIRA PEN) 40 MG/0.4ML PNKT Inject 40 mg into the skin every 14 (fourteen) days. Twice a month   08/09/19  [provider]  pantoprazole (PROTONIX) 40 MG tablet Take 1 tablet (40 mg total) by mouth daily before breakfast. 01/04/19 08/09/19  Rogene Houston, MD    Family History Family History  Problem Relation Age of Onset   Irritable bowel syndrome Mother    Scleroderma Mother    Rheum arthritis Mother    Asthma Mother    Melanoma Father    Cancer Father        Melanoma   Colon cancer Cousin 50       second cousin Sharl Ma)    Cancer Maternal Grandmother        Ovarian   Diabetes Maternal Grandmother    Thyroid disease Maternal Aunt     Social History Social History   Tobacco Use   Smoking status: Never   Smokeless tobacco: Never  Vaping Use   Vaping Use: Never used  Substance Use  Topics   Alcohol use: No   Drug use: No     Allergies   Hydromorphone, Prednisone, Tramadol hcl, and Sulfa antibiotics   Review of Systems Review of Systems Pertinent negatives listed in HPI   Physical Exam Triage Vital Signs ED Triage Vitals [01/26/21 1131]  Enc Vitals Group     BP      Pulse      Resp      Temp      Temp src      SpO2      Weight      Height      Head Circumference      Peak Flow      Pain Score 7     Pain Loc      Pain Edu?      Excl. in [place]?    No data found.  Updated Vital Signs There were no vitals taken for this visit.  Visual Acuity Right Eye Distance:   Left Eye Distance:   Bilateral Distance:    Right Eye Near:   Left Eye Near:    Bilateral Near:     Physical Exam General appearance: Alert, well developed, well nourished, cooperative  Head: Normocephalic, without obvious abnormality, atraumatic Respiratory: Respirations even and unlabored, normal respiratory rate Heart: Rate and rhythm normal. No gallop or murmurs noted on exam  Abdomen: No distention, no rebound tenderness, or no mass +LCVA tenderness  Extremities: No gross deformities Skin: Skin color, texture, turgor normal. No rashes seen  Psych: Appropriate mood and affect. Neurologic: GCS 15, normal coordination, normal gait   UC Treatments / Results  Labs (all labs ordered are listed, but only abnormal results are displayed) Labs Reviewed  POCT URINALYSIS DIP (MANUAL ENTRY) - Abnormal; Notable for the following components:      Result Value   Blood, UA trace-intact (*)    Leukocytes, UA Moderate (2+) (*)    All other components within normal limits  URINE CULTURE    EKG   Radiology No  results found.  Procedures Procedures (including critical care time)  Medications Ordered in UC Medications - No data to display  Initial Impression / Assessment and Plan / UC Course  I have reviewed the triage vital signs and the nursing notes.  Pertinent labs & imaging results that were available during my care of the patient were reviewed by me and considered in my medical decision making (see chart for details).    Acute urinary tract infection.  UA abnormal and findings consistent with UTI. Empiric antibiotic treatment initiated.  Encouraged increase intake of water.  Urine culture pending.  ER if symptoms become severe. Follow-up with PCP if symptoms do not completely resolve.  Final Clinical Impressions(s) / UC Diagnoses   Final diagnoses:  Acute urinary tract infection   Discharge Instructions   None    ED Prescriptions     Medication Sig Dispense Auth. Provider   nitrofurantoin, macrocrystal-monohydrate, (MACROBID) 100 MG capsule Take 1 capsule (100 mg total) by mouth 2 (two) times daily for 7 days. 14 capsule Scot Jun, FNP      PDMP not reviewed this encounter.   Scot Jun, FNP 01/26/21 1224

## 2021-01-26 NOTE — ED Triage Notes (Signed)
Urinary burning, frequency and LT flank pain x 5 days.

## 2021-01-27 LAB — URINE CULTURE
Culture: NO GROWTH
Special Requests: NORMAL

## 2021-01-28 ENCOUNTER — Ambulatory Visit (INDEPENDENT_AMBULATORY_CARE_PROVIDER_SITE_OTHER): Payer: Medicare HMO | Admitting: Urology

## 2021-01-28 ENCOUNTER — Other Ambulatory Visit: Payer: Self-pay

## 2021-01-28 ENCOUNTER — Encounter: Payer: Self-pay | Admitting: Urology

## 2021-01-28 ENCOUNTER — Ambulatory Visit (HOSPITAL_COMMUNITY)
Admission: RE | Admit: 2021-01-28 | Discharge: 2021-01-28 | Disposition: A | Discharge status: Home / Self Care | Payer: Medicare HMO | Source: Ambulatory Visit | Attending: Urology | Admitting: Urology

## 2021-01-28 VITALS — BP 149/85 | HR 79

## 2021-01-28 DIAGNOSIS — R109 Unspecified abdominal pain: Secondary | ICD-10-CM | POA: Diagnosis not present

## 2021-01-28 DIAGNOSIS — N200 Calculus of kidney: Secondary | ICD-10-CM | POA: Diagnosis not present

## 2021-01-28 LAB — URINALYSIS, ROUTINE W REFLEX MICROSCOPIC
Bilirubin, UA: NEGATIVE
Glucose, UA: NEGATIVE
Ketones, UA: NEGATIVE
Nitrite, UA: NEGATIVE
Protein,UA: NEGATIVE
Specific Gravity, UA: 1.01 (ref 1.005–1.030)
Urobilinogen, Ur: 0.2 mg/dL (ref 0.2–1.0)
pH, UA: 6 (ref 5.0–7.5)

## 2021-01-28 LAB — MICROSCOPIC EXAMINATION
Epithelial Cells (non renal): >10 /hpf — AB (ref 0–10)
Renal Epithel, UA: NONE SEEN /hpf
WBC, UA: >30 /hpf — AB (ref 0–5)

## 2021-01-28 MED ORDER — HYDROCODONE-ACETAMINOPHEN 5-325 MG PO TABS: 1.0000 | ORAL_TABLET | Freq: Four times a day (QID) | ORAL | 0 refills | Status: DC | PRN

## 2021-01-28 NOTE — Progress Notes (Signed)
Urological Symptom Review  Patient is experiencing the following symptoms: Frequent urination Hard to postpone urination Burning/pain with urination Get up at night to urinate Leakage of urine Stream starts and stops Blood in urine   Review of Systems  Gastrointestinal (upper)  : Negative for upper GI symptoms  Gastrointestinal (lower) : Negative for lower GI symptoms  Constitutional : Negative for symptoms  Skin: Negative for skin symptoms  Eyes: Negative for eye symptoms  Ear/Nose/Throat : Negative for Ear/Nose/Throat symptoms  Hematologic/Lymphatic: Negative for Hematologic/Lymphatic symptoms  Cardiovascular : Negative for cardiovascular symptoms  Respiratory : Negative for respiratory symptoms  Endocrine: Negative for endocrine symptoms  Musculoskeletal: Negative for musculoskeletal symptoms  Neurological: Negative for neurological symptoms  Psychologic: Negative for psychiatric symptoms

## 2021-01-28 NOTE — Progress Notes (Signed)
H&P  Chief Complaint: Possible kidney stone  History of Present Illness: Bethany King is a 50 y.o. year old female with a history of interstitial cystitis as well as urolithiasis.  She presents today with a several day history of intermittent left flank pain.  No fever, no chills, no gross hematuria.  No nausea or vomiting.  She does have new lower urinary tract symptoms.  She had a KUB last summer which revealed bilateral renal calculi.  She has not had imaging since that time.  Past Medical History:  Diagnosis Date   Allergic rhinitis    Anxiety    Arthritis    Bladder pain    Complication of anesthesia    Costochondritis    hx   DDD (degenerative disc disease), lumbar    Depression    GERD (gastroesophageal reflux disease)    Hepatic hemangioma 2006   stable   History of adenomatous polyp of colon    History of colitis    History of gastritis    History of kidney stones    HTN (hypertension)    Irritable bowel syndrome (IBS)    Medullary sponge kidney    left   Migraine    Nephrolithiasis    bilateral-- nonobstructive per CT   PONV (postoperative nausea and vomiting)    Psoriatic arthritis (HCC)    Psoriatic arthritis ([place])    Spinal stenosis    Urgency of urination     Past Surgical History:  Procedure Laterality Date   ANTERIOR CERVICAL DECOMP/DISCECTOMY FUSION  02/26/2014   C5 - C6; fusion with plate   BIOPSY  [id number]   Procedure: BIOPSY;  Surgeon: Rogene Houston, MD;  [facility];  Service: Endoscopy;;  gastric polyps   CARPAL TUNNEL RELEASE Bilateral right 04-01-2010/  left 05-30-2010   CHOLECYSTECTOMY N/A 07/14/2013   Procedure: LAPAROSCOPIC CHOLECYSTECTOMY;  Surgeon: Jamesetta So, MD;  Location: [facility];  Service: General;  Laterality: N/A;   COLONOSCOPY  10-13-2010   COLONOSCOPY N/A 01/04/2019   Procedure: COLONOSCOPY;  Surgeon: Rogene Houston, MD;  [facility];  Service: Endoscopy;  Laterality: N/A;   CYSTO WITH  HYDRODISTENSION N/A 04/02/2015   Procedure: CYSTOSCOPY/HYDRODISTENSION, BLADDER BIOPSY WITH FULGURATION;  Surgeon: Irine Seal, MD;  Location: [facility];  Service: Urology;  Laterality: N/A;   CYSTO WITH HYDRODISTENSION N/A 03/25/2018   Procedure: CYSTOSCOPY/HYDRODISTENSION, INSTILL MARCAIN AND PYRIDIUM;  Surgeon: Irine Seal, MD;  Location: [facility];  Service: Urology;  Laterality: N/A;   CYSTO WITH HYDRODISTENSION N/A 01/15/2020   Procedure: CYSTOSCOPY/HYDRODISTENSION;  Surgeon: Cleon Gustin, MD;  Location: [facility];  Service: Urology;  Laterality: N/A;   CYSTOSCOPY W/ URETERAL STENT PLACEMENT Bilateral 04/08/2016   Procedure: CYSTOSCOPY WITH BILATERAL RETROGRADE PYELOGRAM, BILATERAL URETERAL STENT PLACEMENT;  Surgeon: Cleon Gustin, MD;  Location: [facility];  Service: Urology;  Laterality: Bilateral;   CYSTOSCOPY W/ URETERAL STENT PLACEMENT Left 04/15/2016   Procedure: CYSTOSCOPY WITH RETROGRADE PYELOGRAM/URETERAL STENT PLACEMENT;  Surgeon: Cleon Gustin, MD;  Location: [facility];  Service: Urology;  Laterality: Left;   CYSTOSCOPY WITH BIOPSY N/A 01/15/2020   Procedure: CYSTOSCOPY WITH BLADDER BIOPSY AND FULGERATION;  Surgeon: Cleon Gustin, MD;  Location: [facility];  Service: Urology;  Laterality: N/A;   CYSTOSCOPY WITH HOLMIUM LASER LITHOTRIPSY Right 04/08/2016   Procedure: CYSTOSCOPY WITH RIGHT RENAL STONE EXTRACTION WITH HOLMIUM LASER LITHOTRIPSY;  Surgeon: Cleon Gustin, MD;  Location: [facility];  Service: Urology;  Laterality: Right;  CYSTOSCOPY WITH HOLMIUM LASER LITHOTRIPSY Left 04/15/2016   Procedure: CYSTOSCOPY WITH HOLMIUM LASER LITHOTRIPSY;  Surgeon: Cleon Gustin, MD;  Location: [facility];  Service: Urology;  Laterality: Left;   CYSTOSCOPY WITH RETROGRADE PYELOGRAM, URETEROSCOPY AND STENT PLACEMENT Left 07/05/2017   Procedure: CYSTOSCOPY WITH RETROGRADE PYELOGRAM, URETEROSCOPY AND STENT PLACEMENT;  Surgeon: Cleon Gustin, MD;  Location: [facility];  Service:  Urology;  Laterality: Left;   DIAGNOSTIC LAPAROSCOPY     DILATION AND CURETTAGE OF UTERUS     DILITATION & CURRETTAGE/HYSTROSCOPY WITH THERMACHOICE ABLATION N/A 09/07/2012   Procedure: DILATATION & CURETTAGE/HYSTEROSCOPY WITH THERMACHOICE ABLATION;  Surgeon: Florian Buff, MD;  Location: [facility];  Service: Gynecology;  Laterality: N/A;   ESOPHAGOGASTRODUODENOSCOPY  10/14/10   ESOPHAGOGASTRODUODENOSCOPY N/A 01/04/2019   Procedure: ESOPHAGOGASTRODUODENOSCOPY (EGD);  Surgeon: Rogene Houston, MD;  [facility];  Service: Endoscopy;  Laterality: N/A;  9:30   EXTRACORPOREAL SHOCK WAVE LITHOTRIPSY  multiple   HOLMIUM LASER APPLICATION Left [id number]   Procedure: HOLMIUM LASER APPLICATION;  Surgeon: Cleon Gustin, MD;  Location: [facility];  Service: Urology;  Laterality: Left;   PERCUTANEOUS NEPHROSTOLITHOTOMY  2003   PLANTAR FASCIA SURGERY Bilateral right 2008//  left 2010   POLYPECTOMY  01/04/2019   Procedure: POLYPECTOMY;  Surgeon: Rogene Houston, MD;  [facility];  Service: Endoscopy;;  colon   STONE EXTRACTION WITH BASKET Left 04/15/2016   Procedure: STONE EXTRACTION WITH BASKET;  Surgeon: Cleon Gustin, MD;  Location: [facility];  Service: Urology;  Laterality: Left;    Home Medications:  (Not in a hospital admission)   Allergies:  Allergies  Allergen Reactions   Hydromorphone Itching   Prednisone Other (See Comments)    Increase anxiety symptoms, insomnia   Tramadol Hcl Other (See Comments)    dizziness   Sulfa Antibiotics Rash    Family History  Problem Relation Age of Onset   Irritable bowel syndrome Mother    Scleroderma Mother    Rheum arthritis Mother    Asthma Mother    Melanoma Father    Cancer Father        Melanoma   Colon cancer Cousin 89       second cousin Sunday Spillers Lovelace)   Cancer Maternal Grandmother        Ovarian   Diabetes Maternal Grandmother    Thyroid disease Maternal Aunt     Social History:  reports that she has never  smoked. She has never used smokeless tobacco. She reports that she does not drink alcohol and does not use drugs.  ROS: A complete review of systems was performed.  All systems are negative except for pertinent findings as noted.  Physical Exam:  Vital signs in last 24 hours: '@VSRANGES'$ @ General:  Alert and oriented, No acute distress HEENT: Normocephalic, atraumatic Neck: No JVD or lymphadenopathy Cardiovascular: Regular rate  Lungs: Normal inspiratory/expiratory excursion Abdomen: Soft, mild left lower quadrant tenderness Back: Left CVA tenderness Extremities: No edema Neurologic: Grossly intact  I have reviewed prior pt notes  I have reviewed notes from referring/previous physicians  I have reviewed urinalysis results  I have independently reviewed prior imaging--KUB reviewed    Impression/Assessment:  Probable left ureteral calculus based on patient's symptoms  Plan:  1.  She was prescribed Norco.  She is on long-term narcotics and she was given 10 tablets  2.  We will get KUB today and call with results and follow-up  Jorja Loa 01/28/2021, 1:38 PM  Annie Main  Faythe Casa MD

## 2021-01-30 ENCOUNTER — Telehealth: Payer: Self-pay

## 2021-01-30 NOTE — Telephone Encounter (Signed)
Patient called and aware.

## 2021-01-30 NOTE — Telephone Encounter (Signed)
-----   Message from Dorisann Frames, RN sent at 01/30/2021  4:26 PM EDT -----  ----- Message ----- From: Franchot Gallo, MD Sent: 01/30/2021  10:03 AM EDT To: Dorisann Frames, RN  Notify patient that it does not seem she has a stone passing on her x-ray. ----- Message ----- From: Dorisann Frames, RN Sent: 01/29/2021   9:52 AM EDT To: Franchot Gallo, MD  Please review

## 2021-02-03 DIAGNOSIS — G894 Chronic pain syndrome: Secondary | ICD-10-CM | POA: Diagnosis not present

## 2021-02-03 DIAGNOSIS — L4050 Arthropathic psoriasis, unspecified: Secondary | ICD-10-CM | POA: Diagnosis not present

## 2021-02-03 DIAGNOSIS — M47814 Spondylosis without myelopathy or radiculopathy, thoracic region: Secondary | ICD-10-CM | POA: Diagnosis not present

## 2021-02-03 DIAGNOSIS — R69 Illness, unspecified: Secondary | ICD-10-CM | POA: Diagnosis not present

## 2021-02-03 DIAGNOSIS — N3010 Interstitial cystitis (chronic) without hematuria: Secondary | ICD-10-CM | POA: Diagnosis not present

## 2021-02-03 DIAGNOSIS — E6609 Other obesity due to excess calories: Secondary | ICD-10-CM | POA: Diagnosis not present

## 2021-02-03 DIAGNOSIS — Z6831 Body mass index (BMI) 31.0-31.9, adult: Secondary | ICD-10-CM | POA: Diagnosis not present

## 2021-02-03 DIAGNOSIS — M5134 Other intervertebral disc degeneration, thoracic region: Secondary | ICD-10-CM | POA: Diagnosis not present

## 2021-02-12 ENCOUNTER — Other Ambulatory Visit (HOSPITAL_COMMUNITY): Payer: Self-pay | Admitting: Internal Medicine

## 2021-02-12 ENCOUNTER — Other Ambulatory Visit: Payer: Self-pay | Admitting: Internal Medicine

## 2021-02-12 DIAGNOSIS — M5414 Radiculopathy, thoracic region: Secondary | ICD-10-CM

## 2021-02-16 ENCOUNTER — Other Ambulatory Visit: Payer: Self-pay | Admitting: Adult Health

## 2021-02-19 DIAGNOSIS — L4050 Arthropathic psoriasis, unspecified: Secondary | ICD-10-CM | POA: Diagnosis not present

## 2021-02-19 DIAGNOSIS — M25579 Pain in unspecified ankle and joints of unspecified foot: Secondary | ICD-10-CM | POA: Diagnosis not present

## 2021-02-19 DIAGNOSIS — M79643 Pain in unspecified hand: Secondary | ICD-10-CM | POA: Diagnosis not present

## 2021-02-19 DIAGNOSIS — L409 Psoriasis, unspecified: Secondary | ICD-10-CM | POA: Diagnosis not present

## 2021-02-24 ENCOUNTER — Ambulatory Visit (HOSPITAL_COMMUNITY): Payer: Medicare HMO

## 2021-02-24 ENCOUNTER — Encounter (HOSPITAL_COMMUNITY): Payer: Self-pay

## 2021-03-05 ENCOUNTER — Other Ambulatory Visit: Payer: Self-pay | Admitting: Adult Health

## 2021-03-10 ENCOUNTER — Encounter (HOSPITAL_COMMUNITY): Payer: Self-pay

## 2021-03-10 ENCOUNTER — Ambulatory Visit (HOSPITAL_COMMUNITY): Payer: Medicare HMO

## 2021-03-12 ENCOUNTER — Encounter: Payer: Self-pay | Admitting: Urology

## 2021-03-12 ENCOUNTER — Ambulatory Visit (INDEPENDENT_AMBULATORY_CARE_PROVIDER_SITE_OTHER): Payer: Medicare HMO | Admitting: Urology

## 2021-03-12 ENCOUNTER — Other Ambulatory Visit: Payer: Self-pay

## 2021-03-12 VITALS — BP 135/75 | HR 70

## 2021-03-12 DIAGNOSIS — N3010 Interstitial cystitis (chronic) without hematuria: Secondary | ICD-10-CM

## 2021-03-12 DIAGNOSIS — R102 Pelvic and perineal pain: Secondary | ICD-10-CM

## 2021-03-12 DIAGNOSIS — N3281 Overactive bladder: Secondary | ICD-10-CM | POA: Diagnosis not present

## 2021-03-12 DIAGNOSIS — N200 Calculus of kidney: Secondary | ICD-10-CM

## 2021-03-12 LAB — MICROSCOPIC EXAMINATION
Renal Epithel, UA: NONE SEEN /hpf
WBC, UA: >30 /hpf — AB (ref 0–5)

## 2021-03-12 LAB — URINALYSIS, ROUTINE W REFLEX MICROSCOPIC
Bilirubin, UA: NEGATIVE
Glucose, UA: NEGATIVE
Ketones, UA: NEGATIVE
Nitrite, UA: NEGATIVE
Specific Gravity, UA: >1.03 — ABNORMAL HIGH (ref 1.005–1.030)
Urobilinogen, Ur: 0.2 mg/dL (ref 0.2–1.0)
pH, UA: 5.5 (ref 5.0–7.5)

## 2021-03-12 MED ORDER — NITROFURANTOIN MACROCRYSTAL 50 MG PO CAPS: 50.0000 mg | ORAL_CAPSULE | Freq: Every day | ORAL | 11 refills | Status: DC

## 2021-03-12 MED ORDER — ELMIRON 100 MG PO CAPS: ORAL_CAPSULE | ORAL | 11 refills | Status: DC

## 2021-03-12 MED ORDER — HYDROXYZINE HCL 10 MG PO TABS: 10.0000 mg | ORAL_TABLET | Freq: Every day | ORAL | 11 refills | Status: DC

## 2021-03-12 NOTE — Patient Instructions (Signed)
Dietary Guidelines to Help Prevent Kidney Stones Kidney stones are deposits of minerals and salts that form inside your kidneys. Your risk of developing kidney stones may be greater depending on your diet, your lifestyle, the medicines you take, and whether you have certain medical conditions. Most people can lower their chances of developing kidney stones by following the instructions below. Your dietitian may give you more specific instructions depending on your overall health and the type of kidney stones you tend to develop. What are tips for following this plan? Reading food labels  Choose foods with "no salt added" or "low-salt" labels. Limit your salt (sodium) intake to less than 1,500 mg a day. Choose foods with calcium for each meal and snack. Try to eat about 300 mg of calcium at each meal. Foods that contain 200-500 mg of calcium a serving include: 8 oz (237 mL) of milk, calcium-fortifiednon-dairy milk, and calcium-fortifiedfruit juice. Calcium-fortified means that calcium has been added to these drinks. 8 oz (237 mL) of kefir, yogurt, and soy yogurt. 4 oz (114 g) of tofu. 1 oz (28 g) of cheese. 1 cup (150 g) of dried figs. 1 cup (91 g) of cooked broccoli. One 3 oz (85 g) can of sardines or mackerel. Most people need 1,000-1,500 mg of calcium a day. Talk to your dietitian about how much calcium is recommended for you. Shopping Buy plenty of fresh fruits and vegetables. Most people do not need to avoid fruits and vegetables, even if these foods contain nutrients that may contribute to kidney stones. When shopping for convenience foods, choose: Whole pieces of fruit. Pre-made salads with dressing on the side. Low-fat fruit and yogurt smoothies. Avoid buying frozen meals or prepared deli foods. These can be high in sodium. Look for foods with live cultures, such as yogurt and kefir. Choose high-fiber grains, such as whole-wheat breads, oat bran, and wheat cereals. Cooking Do not add  salt to food when cooking. Place a salt shaker on the table and allow each person to add his or her own salt to taste. Use vegetable protein, such as beans, textured vegetable protein (TVP), or tofu, instead of meat in pasta, casseroles, and soups. Meal planning Eat less salt, if told by your dietitian. To do this: Avoid eating processed or pre-made food. Avoid eating fast food. Eat less animal protein, including cheese, meat, poultry, or fish, if told by your dietitian. To do this: Limit the number of times you have meat, poultry, fish, or cheese each week. Eat a diet free of meat at least 2 days a week. Eat only one serving each day of meat, poultry, fish, or seafood. When you prepare animal protein, cut pieces into small portion sizes. For most meat and fish, one serving is about the size of the palm of your hand. Eat at least five servings of fresh fruits and vegetables each day. To do this: Keep fruits and vegetables on hand for snacks. Eat one piece of fruit or a handful of berries with breakfast. Have a salad and fruit at lunch. Have two kinds of vegetables at dinner. Limit foods that are high in a substance called oxalate. These include: Spinach (cooked), rhubarb, beets, sweet potatoes, and Swiss chard. Peanuts. Potato chips, french fries, and baked potatoes with skin on. Nuts and nut products. Chocolate. If you regularly take a diuretic medicine, make sure to eat at least 1 or 2 servings of fruits or vegetables that are high in potassium each day. These include: Avocado. Banana. Orange, prune,   carrot, or tomato juice. Baked potato. Cabbage. Beans and split peas. Lifestyle  Drink enough fluid to keep your urine pale yellow. This is the most important thing you can do. Spread your fluid intake throughout the day. If you drink alcohol: Limit how much you use to: 0-1 drink a day for women who are not pregnant. 0-2 drinks a day for men. Be aware of how much alcohol is in your  drink. In the [place] one drink equals one 12 oz bottle of beer (355 mL), one 5 oz glass of wine (148 mL), or one 1 oz glass of hard liquor (44 mL). Lose weight if told by your health care provider. Work with your dietitian to find an eating plan and weight loss strategies that work best for you. General information Talk to your health care provider and dietitian about taking daily supplements. You may be told the following depending on your health and the cause of your kidney stones: Not to take supplements with vitamin C. To take a calcium supplement. To take a daily probiotic supplement. To take other supplements such as magnesium, fish oil, or vitamin B6. Take over-the-counter and prescription medicines only as told by your health care provider. These include supplements. What foods should I limit? Limit your intake of the following foods, or eat them as told by your dietitian. Vegetables Spinach. Rhubarb. Beets. Canned vegetables. Pickles. Olives. Baked potatoes with skin. Grains Wheat bran. Baked goods. Salted crackers. Cereals high in sugar. Meats and other proteins Nuts. Nut butters. Large portions of meat, poultry, or fish. Salted, precooked, or cured meats, such as sausages, meat loaves, and hot dogs. Dairy Cheese. Beverages Regular soft drinks. Regular vegetable juice. Seasonings and condiments Seasoning blends with salt. Salad dressings. Soy sauce. Ketchup. Barbecue sauce. Other foods Canned soups. Canned pasta sauce. Casseroles. Pizza. Lasagna. Frozen meals. Potato chips. French fries. The items listed above may not be a complete list of foods and beverages you should limit. Contact a dietitian for more information. What foods should I avoid? Talk to your dietitian about specific foods you should avoid based on the type of kidney stones you have and your overall health. Fruits Grapefruit. The item listed above may not be a complete list of foods and beverages you should  avoid. Contact a dietitian for more information. Summary Kidney stones are deposits of minerals and salts that form inside your kidneys. You can lower your risk of kidney stones by making changes to your diet. The most important thing you can do is drink enough fluid. Drink enough fluid to keep your urine pale yellow. Talk to your dietitian about how much calcium you should have each day, and eat less salt and animal protein as told by your dietitian. This information is not intended to replace advice given to you by your health care provider. Make sure you discuss any questions you have with your health care provider. Document Revised: 05/25/2019 Document Reviewed: 05/25/2019 Elsevier Patient Education  2022 [employer]  

## 2021-03-12 NOTE — Progress Notes (Signed)
post void residual=0 Urological Symptom Review  Patient is experiencing the following symptoms: Frequent urination Hard to postpone urination Burning/pain with urination Get up at night to urinate Leakage of urine Kidney stones  Review of Systems  Gastrointestinal (upper)  : Negative for upper GI symptoms  Gastrointestinal (lower) : Negative for lower GI symptoms  Constitutional : Negative for symptoms  Skin: Negative for skin symptoms  Eyes: Negative for eye symptoms  Ear/Nose/Throat : Negative for Ear/Nose/Throat symptoms  Hematologic/Lymphatic: Negative for Hematologic/Lymphatic symptoms  Cardiovascular : Negative for cardiovascular symptoms  Respiratory : Negative for respiratory symptoms  Endocrine: Negative for endocrine symptoms  Musculoskeletal: Back pain Joint pain  Neurological: Negative for neurological symptoms  Psychologic: Depression Anxiety

## 2021-03-12 NOTE — Progress Notes (Signed)
03/12/2021 2:09 PM   Bethany King 12-31-1970 [id number]  Referring provider: Elfredia Nevins, MD [address] [place],  [place] [address]  Followup interstitial cystitis   HPI: Ms Tessmann is a 50yo here for followup for interstitial cystitis and OAB. Her urine frequency has improved to every 2 hours. She is off gemtesa. She has intermittent dysuria and takes elmiron BID. She is on hydroxyzine. UA today shows WBCs, RBC and CaOx crystals. KUB from 8/16 showed bilateral nephrolithiasis. No flank pain currently.    PMH: Past Medical History:  Diagnosis Date   Allergic rhinitis    Anxiety    Arthritis    Bladder pain    Complication of anesthesia    Costochondritis    hx   DDD (degenerative disc disease), lumbar    Depression    GERD (gastroesophageal reflux disease)    Hepatic hemangioma 2006   stable   History of adenomatous polyp of colon    History of colitis    History of gastritis    History of kidney stones    HTN (hypertension)    Irritable bowel syndrome (IBS)    Medullary sponge kidney    left   Migraine    Nephrolithiasis    bilateral-- nonobstructive per CT   PONV (postoperative nausea and vomiting)    Psoriatic arthritis (HCC)    Psoriatic arthritis (HCC)    Spinal stenosis    Urgency of urination     Surgical History: Past Surgical History:  Procedure Laterality Date   ANTERIOR CERVICAL DECOMP/DISCECTOMY FUSION  02/26/2014   C5 - C6; fusion with plate   BIOPSY  01/04/2019   Procedure: BIOPSY;  Surgeon: Malissa Hippo, MD;  [facility]: [facility];  Service: Endoscopy;;  gastric polyps   CARPAL TUNNEL RELEASE Bilateral right 04-01-2010/  left 05-30-2010   CHOLECYSTECTOMY N/A 07/14/2013   Procedure: LAPAROSCOPIC CHOLECYSTECTOMY;  Surgeon: Dalia Heading, MD;  Location: [facility];  Service: General;  Laterality: N/A;   COLONOSCOPY  10-13-2010   COLONOSCOPY N/A 01/04/2019   Procedure: COLONOSCOPY;  Surgeon: Malissa Hippo, MD;  [facility];  Service: Endoscopy;  Laterality: N/A;   CYSTO WITH HYDRODISTENSION N/A 04/02/2015   Procedure: CYSTOSCOPY/HYDRODISTENSION, BLADDER BIOPSY WITH FULGURATION;  Surgeon: Bjorn Pippin, MD;  Location: [facility];  Service: Urology;  Laterality: N/A;   CYSTO WITH HYDRODISTENSION N/A 03/25/2018   Procedure: CYSTOSCOPY/HYDRODISTENSION, INSTILL MARCAIN AND PYRIDIUM;  Surgeon: Bjorn Pippin, MD;  Location: [facility];  Service: Urology;  Laterality: N/A;   CYSTO WITH HYDRODISTENSION N/A 01/15/2020   Procedure: CYSTOSCOPY/HYDRODISTENSION;  Surgeon: Malen Gauze, MD;  Location: [facility];  Service: Urology;  Laterality: N/A;   CYSTOSCOPY W/ URETERAL STENT PLACEMENT Bilateral 04/08/2016   Procedure: CYSTOSCOPY WITH BILATERAL RETROGRADE PYELOGRAM, BILATERAL URETERAL STENT PLACEMENT;  Surgeon: Malen Gauze, MD;  Location: [facility];  Service: Urology;  Laterality: Bilateral;   CYSTOSCOPY W/ URETERAL STENT PLACEMENT Left 04/15/2016   Procedure: CYSTOSCOPY WITH RETROGRADE PYELOGRAM/URETERAL STENT PLACEMENT;  Surgeon: Malen Gauze, MD;  Location: [facility];  Service: Urology;  Laterality: Left;   CYSTOSCOPY WITH BIOPSY N/A 01/15/2020   Procedure: CYSTOSCOPY WITH BLADDER BIOPSY AND FULGERATION;  Surgeon: Malen Gauze, MD;  Location: [facility];  Service: Urology;  Laterality: N/A;   CYSTOSCOPY WITH HOLMIUM LASER LITHOTRIPSY Right 04/08/2016   Procedure: CYSTOSCOPY WITH RIGHT RENAL STONE EXTRACTION WITH HOLMIUM LASER LITHOTRIPSY;  Surgeon: Malen Gauze, MD;  Location: [facility];  Service: Urology;  Laterality: Right;   CYSTOSCOPY WITH HOLMIUM LASER LITHOTRIPSY Left 04/15/2016   Procedure: CYSTOSCOPY WITH HOLMIUM LASER LITHOTRIPSY;  Surgeon: Malen Gauze, MD;  Location: [facility];  Service: Urology;  Laterality: Left;   CYSTOSCOPY WITH RETROGRADE PYELOGRAM, URETEROSCOPY AND STENT PLACEMENT Left 07/05/2017   Procedure: CYSTOSCOPY WITH RETROGRADE PYELOGRAM, URETEROSCOPY AND STENT  PLACEMENT;  Surgeon: Malen Gauze, MD;  Location: [facility];  Service: Urology;  Laterality: Left;   DIAGNOSTIC LAPAROSCOPY     DILATION AND CURETTAGE OF UTERUS     DILITATION & CURRETTAGE/HYSTROSCOPY WITH THERMACHOICE ABLATION N/A 09/07/2012   Procedure: DILATATION & CURETTAGE/HYSTEROSCOPY WITH THERMACHOICE ABLATION;  Surgeon: Lazaro Arms, MD;  Location: [facility];  Service: Gynecology;  Laterality: N/A;   ESOPHAGOGASTRODUODENOSCOPY  10/14/10   ESOPHAGOGASTRODUODENOSCOPY N/A 01/04/2019   Procedure: ESOPHAGOGASTRODUODENOSCOPY (EGD);  Surgeon: Malissa Hippo, MD;  [facility];  Service: Endoscopy;  Laterality: N/A;  9:30   EXTRACORPOREAL SHOCK WAVE LITHOTRIPSY  multiple   HOLMIUM LASER APPLICATION Left 07/05/2017   Procedure: HOLMIUM LASER APPLICATION;  Surgeon: Malen Gauze, MD;  Location: [facility];  Service: Urology;  Laterality: Left;   PERCUTANEOUS NEPHROSTOLITHOTOMY  2003   PLANTAR FASCIA SURGERY Bilateral right 2008//  left 2010   POLYPECTOMY  01/04/2019   Procedure: POLYPECTOMY;  Surgeon: Malissa Hippo, MD;  [facility];  Service: Endoscopy;;  colon   STONE EXTRACTION WITH BASKET Left 04/15/2016   Procedure: STONE EXTRACTION WITH BASKET;  Surgeon: Malen Gauze, MD;  Location: [facility];  Service: Urology;  Laterality: Left;    Home Medications:  Allergies as of 03/12/2021       Reactions   Hydromorphone Itching   Prednisone Other (See Comments)   Increase anxiety symptoms, insomnia   Tramadol Hcl Other (See Comments)   dizziness   Sulfa Antibiotics Rash        Medication List        Accurate as of March 12, 2021  2:09 PM. If you have any questions, ask your nurse or doctor.          ALPRAZolam 0.5 MG tablet Commonly known as: XANAX Take 0.5 mg by mouth 3 (three) times daily as needed for anxiety.   Cosentyx (300 MG Dose) 150 MG/ML Sosy Generic drug: Secukinumab (300 MG Dose) Inject 300 mg into the skin every 30 (thirty)  days.   DULoxetine 60 MG capsule Commonly known as: CYMBALTA Take 60 mg by mouth daily.   Elmiron 100 MG capsule Generic drug: pentosan polysulfate TAKE 1 CAPSULE(100 MG) BY MOUTH THREE TIMES DAILY   estradiol 1 MG tablet Commonly known as: ESTRACE TAKE 1 TABLET(1 MG) BY MOUTH DAILY   fluticasone 50 MCG/ACT nasal spray Commonly known as: FLONASE Place into both nostrils.   Gemtesa 75 MG Tabs Generic drug: Vibegron Take 1 tablet by mouth daily.   HYDROcodone-acetaminophen 10-325 MG tablet Commonly known as: NORCO Take 1 tablet by mouth 4 (four) times daily as needed for moderate pain.   HYDROcodone-acetaminophen 5-325 MG tablet Commonly known as: Norco Take 1 tablet by mouth every 6 (six) hours as needed for moderate pain.   hydrOXYzine 10 MG tablet Commonly known as: ATARAX/VISTARIL Take 1 tablet (10 mg total) by mouth at bedtime.   ibuprofen 200 MG tablet Commonly known as: ADVIL Take 200 mg by mouth in the morning and at bedtime.   potassium chloride SA 20 MEQ tablet Commonly known as: KLOR-CON Take 40 mEq by mouth 2 (two) times daily.  Premarin vaginal cream Generic drug: conjugated estrogens Use 0.5 gm daily for 2 weeks then 2-3 x a week   progesterone 200 MG capsule Commonly known as: PROMETRIUM TAKE 1 CAPSULE(200 MG) BY MOUTH AT BEDTIME   tiZANidine 4 MG tablet Commonly known as: ZANAFLEX Take by mouth.   Tremfya 100 MG/ML Sosy Generic drug: Guselkumab Inject into the skin.   triamterene-hydrochlorothiazide 37.5-25 MG capsule Commonly known as: DYAZIDE Take 1 capsule by mouth daily.        Allergies:  Allergies  Allergen Reactions   Hydromorphone Itching   Prednisone Other (See Comments)    Increase anxiety symptoms, insomnia   Tramadol Hcl Other (See Comments)    dizziness   Sulfa Antibiotics Rash    Family History: Family History  Problem Relation Age of Onset   Irritable bowel syndrome Mother    Scleroderma Mother    Rheum  arthritis Mother    Asthma Mother    Melanoma Father    Cancer Father        Melanoma   Colon cancer Cousin 64       second cousin Nettie Elm Lovelace)   Cancer Maternal Grandmother        Ovarian   Diabetes Maternal Grandmother    Thyroid disease Maternal Aunt     Social History:  reports that she has never smoked. She has never used smokeless tobacco. She reports that she does not drink alcohol and does not use drugs.  ROS: All other review of systems were reviewed and are negative except what is noted above in HPI  Physical Exam: BP 135/75   Pulse 70   Constitutional:  Alert and oriented, No acute distress. HEENT: [place] AT, moist mucus membranes.  Trachea midline, no masses. Cardiovascular: No clubbing, cyanosis, or edema. Respiratory: Normal respiratory effort, no increased work of breathing. GI: Abdomen is soft, nontender, nondistended, no abdominal masses GU: No CVA tenderness.  Lymph: No cervical or inguinal lymphadenopathy. Skin: No rashes, bruises or suspicious lesions. Neurologic: Grossly intact, no focal deficits, moving all 4 extremities. Psychiatric: Normal mood and affect.  Laboratory Data: Lab Results  Component Value Date   WBC 7.0 03/21/2018   HGB 10.9 (L) 03/21/2018   HCT 35.3 (L) 03/21/2018   MCV 96.7 03/21/2018   PLT 330 03/21/2018    Lab Results  Component Value Date   CREATININE 0.76 01/12/2020    No results found for: PSA  No results found for: TESTOSTERONE  Lab Results  Component Value Date   HGBA1C 5.7 (H) 09/15/2011    Urinalysis    Component Value Date/Time   COLORURINE AMBER (A) 11/17/2019 1600   APPEARANCEUR Cloudy (A) 01/28/2021 1358   LABSPEC 1.004 (L) 11/17/2019 1600   PHURINE 6.0 11/17/2019 1600   GLUCOSEU Negative 01/28/2021 1358   HGBUR SMALL (A) 11/17/2019 1600   BILIRUBINUR Negative 01/28/2021 1358   KETONESUR negative 01/26/2021 1125   KETONESUR NEGATIVE 11/17/2019 1600   PROTEINUR Negative 01/28/2021 1358    PROTEINUR NEGATIVE 11/17/2019 1600   UROBILINOGEN 0.2 01/26/2021 1125   UROBILINOGEN 0.2 02/02/2013 1620   NITRITE Negative 01/28/2021 1358   NITRITE POSITIVE (A) 11/17/2019 1600   LEUKOCYTESUR 3+ (A) 01/28/2021 1358   LEUKOCYTESUR LARGE (A) 11/17/2019 1600    Lab Results  Component Value Date   LABMICR See below: 01/28/2021   WBCUA >30 (A) 01/28/2021   LABEPIT >10 (A) 01/28/2021   BACTERIA Few (A) 01/28/2021    Pertinent Imaging: KUB 01/28/2021 Results for orders placed in  visit on 01/28/21  DG Abd 1 View  Narrative CLINICAL DATA:  Kidney stones.  Left-sided flank pain.  EXAM: ABDOMEN - 1 VIEW  COMPARISON:  11/17/2019.  FINDINGS: Surgical clips right upper quadrant. Multiple bilateral calcific densities are noted both kidneys consistent with bilateral nephrolithiasis. Similar findings noted on prior exam. No evidence of ureteral stone. Stool noted throughout the colon. No bowel distention. Degenerative changes lumbar spine and both hips.  IMPRESSION: Bilateral nephrolithiasis, stable from prior exam. No evidence of urolithiasis.   Electronically Signed By: Maisie Fus  Register M.D. On: 01/29/2021 09:45  No results found for this or any previous visit.  No results found for this or any previous visit.  No results found for this or any previous visit.  Results for orders placed during the hospital encounter of 09/01/17  US RENAL  Narrative CLINICAL DATA:  Kidney stones  EXAM: RENAL / URINARY TRACT ULTRASOUND COMPLETE  COMPARISON:  Abdominal ultrasound of August 03, 2017  FINDINGS: Right Kidney:  Length: 10.7 cm. There is cortical thinning diffusely. The cortical echotexture remains lower than that of the adjacent liver. There is a nonobstructing mid/upper pole stone measuring approximately 7 mm in diameter. There is no hydronephrosis.  Left Kidney:  Length: 11 cm. There is diffuse cortical thinning similar to that on the right. There is a mid to  upper pole stone measuring 6 mm in diameter. There is no hydronephrosis.  Bladder:  Appears normal for degree of bladder distention. Bilateral ureteral jets are observed.  IMPRESSION: Bilateral nonobstructing kidney stones. Diffuse renal cortical thinning. No hydronephrosis. Normal appearing urinary bladder.   Electronically Signed By: David  [place] M.D. On: 09/01/2017 10:23  No results found for this or any previous visit.  No results found for this or any previous visit.  No results found for this or any previous visit.   Assessment & Plan:    1. Chronic interstitial cystitis -Continue elmiron and hydroxyzine, continue macrobid 50mg  qhs  2. Overactive bladder -Patient defers therapy at this time  3. Pelvic pain in female -Continue elmiron and hydroxyzine  RTC 6 months with KUB   No follow-ups on file.  Wilkie Aye, MD  [facility] [place]

## 2021-03-12 NOTE — Addendum Note (Signed)
Addended byIris Pert on: 03/12/2021 03:39 PM   Modules accepted: Orders

## 2021-03-14 LAB — URINE CULTURE

## 2021-03-17 NOTE — Progress Notes (Signed)
Results sent via my chart 

## 2021-03-27 DIAGNOSIS — Z20822 Contact with and (suspected) exposure to covid-19: Secondary | ICD-10-CM | POA: Diagnosis not present

## 2021-03-30 ENCOUNTER — Encounter (HOSPITAL_COMMUNITY): Payer: Self-pay

## 2021-03-30 ENCOUNTER — Other Ambulatory Visit: Payer: Self-pay

## 2021-03-30 ENCOUNTER — Emergency Department (HOSPITAL_COMMUNITY)
Admission: EM | Admit: 2021-03-30 | Discharge: 2021-03-31 | Disposition: A | Discharge status: Home / Self Care | Payer: Medicare HMO | Attending: Emergency Medicine | Admitting: Emergency Medicine

## 2021-03-30 DIAGNOSIS — W5501XA Bitten by cat, initial encounter: Secondary | ICD-10-CM | POA: Insufficient documentation

## 2021-03-30 DIAGNOSIS — Z203 Contact with and (suspected) exposure to rabies: Secondary | ICD-10-CM | POA: Diagnosis not present

## 2021-03-30 DIAGNOSIS — M7989 Other specified soft tissue disorders: Secondary | ICD-10-CM | POA: Diagnosis not present

## 2021-03-30 DIAGNOSIS — Z23 Encounter for immunization: Secondary | ICD-10-CM | POA: Diagnosis not present

## 2021-03-30 DIAGNOSIS — I10 Essential (primary) hypertension: Secondary | ICD-10-CM | POA: Diagnosis not present

## 2021-03-30 DIAGNOSIS — M795 Residual foreign body in soft tissue: Secondary | ICD-10-CM | POA: Diagnosis not present

## 2021-03-30 DIAGNOSIS — Z79899 Other long term (current) drug therapy: Secondary | ICD-10-CM | POA: Diagnosis not present

## 2021-03-30 DIAGNOSIS — Z2914 Encounter for prophylactic rabies immune globin: Secondary | ICD-10-CM | POA: Diagnosis not present

## 2021-03-30 DIAGNOSIS — S61250A Open bite of right index finger without damage to nail, initial encounter: Secondary | ICD-10-CM | POA: Diagnosis not present

## 2021-03-30 DIAGNOSIS — S61230A Puncture wound without foreign body of right index finger without damage to nail, initial encounter: Secondary | ICD-10-CM | POA: Diagnosis present

## 2021-03-30 MED ORDER — TETANUS-DIPHTH-ACELL PERTUSSIS 5-2.5-18.5 LF-MCG/0.5 IM SUSY
0.5000 mL | PREFILLED_SYRINGE | Freq: Once | INTRAMUSCULAR | Status: AC
  Administered 2021-03-31: 0.5 mL via INTRAMUSCULAR
  Filled 2021-03-30: qty 0.5

## 2021-03-30 MED ORDER — AMOXICILLIN-POT CLAVULANATE 875-125 MG PO TABS
1.0000 | ORAL_TABLET | Freq: Once | ORAL | Status: AC
  Administered 2021-03-31: 1 via ORAL
  Filled 2021-03-30: qty 1

## 2021-03-30 MED ORDER — RABIES VACCINE, PCEC IM SUSR
1.0000 mL | Freq: Once | INTRAMUSCULAR | Status: AC
  Administered 2021-03-31: 1 mL via INTRAMUSCULAR
  Filled 2021-03-30: qty 1

## 2021-03-30 MED ORDER — RABIES IMMUNE GLOBULIN 150 UNIT/ML IM INJ
20.0000 [IU]/kg | INJECTION | Freq: Once | INTRAMUSCULAR | Status: AC
  Administered 2021-03-31: 1575 [IU] via INTRAMUSCULAR
  Filled 2021-03-30: qty 20

## 2021-03-30 NOTE — ED Triage Notes (Signed)
Cat bite to right second finger at 1600. Cat is stray and pt is unable to quarantine. Vaccination of cat is unknown. Pt is on Tremphya for psoriatic arthritis.

## 2021-03-31 ENCOUNTER — Emergency Department (HOSPITAL_COMMUNITY): Payer: Medicare HMO

## 2021-03-31 ENCOUNTER — Ambulatory Visit: Payer: Self-pay

## 2021-03-31 DIAGNOSIS — M7989 Other specified soft tissue disorders: Secondary | ICD-10-CM | POA: Diagnosis not present

## 2021-03-31 DIAGNOSIS — S61250A Open bite of right index finger without damage to nail, initial encounter: Secondary | ICD-10-CM | POA: Diagnosis not present

## 2021-03-31 DIAGNOSIS — M795 Residual foreign body in soft tissue: Secondary | ICD-10-CM | POA: Diagnosis not present

## 2021-03-31 MED ORDER — AMOXICILLIN-POT CLAVULANATE 875-125 MG PO TABS: 1.0000 | ORAL_TABLET | Freq: Two times a day (BID) | ORAL | 0 refills | Status: DC

## 2021-03-31 MED ORDER — RABIES IMMUNE GLOBULIN 150 UNIT/ML IM INJ
INJECTION | INTRAMUSCULAR | Status: AC
  Filled 2021-03-31: qty 12

## 2021-03-31 NOTE — Discharge Instructions (Addendum)
Take the antibiotics as prescribed.  Follow-up with urgent care for further doses or rabies vaccine on October 20, 24th, 31st and November 7.  (Days 2,8,20,60). Return to the ED with new or worsening symptoms.

## 2021-03-31 NOTE — ED Provider Notes (Signed)
[facility] EMERGENCY DEPARTMENT Provider Note   CSN: [id number] Arrival date & time: 03/30/21  2311     History Chief Complaint  Patient presents with   Animal Bite    Bethany King is a 50 y.o. female.  Patient has history of psoriatic arthritis, depression, GERD, hypertension, kidney stones here with cat bite.  States she was bitten by a stray cat to her right index finger about 4 PM.  Bite x2 with puncture wounds to the proximal phalanx.  Washed at home with saline as well as peroxide.  No fevers, chills, nausea or vomiting.  Tetanus about 10 years ago.  No chest pain or shortness of breath.  No abdominal pain.  No leg pain or leg swelling. Unknown vaccination status of cat.  The history is provided by the patient.  Animal Bite Associated symptoms: no fever       Past Medical History:  Diagnosis Date   Allergic rhinitis    Anxiety    Arthritis    Bladder pain    Complication of anesthesia    Costochondritis    hx   DDD (degenerative disc disease), lumbar    Depression    GERD (gastroesophageal reflux disease)    Hepatic hemangioma 2006   stable   History of adenomatous polyp of colon    History of colitis    History of gastritis    History of kidney stones    HTN (hypertension)    Irritable bowel syndrome (IBS)    Medullary sponge kidney    left   Migraine    Nephrolithiasis    bilateral-- nonobstructive per CT   PONV (postoperative nausea and vomiting)    Psoriatic arthritis (HCC)    Psoriatic arthritis ([place])    Spinal stenosis    Urgency of urination     Patient Active Problem List   Diagnosis Date Noted   Overactive bladder 09/09/2020   Encounter for screening fecal occult blood testing 04/09/2020   Encounter for well woman exam with routine gynecological exam 04/09/2020   Hormone replacement therapy (HRT) 04/09/2020   Pelvic pain in female 04/05/2020   Urinary urgency 04/05/2020   Vaginal dryness 02/27/2020   Burning with urination  02/27/2020   Vaginal burning 02/27/2020   Chronic interstitial cystitis 02/09/2020   Menopause 04/04/2019   Screening for colorectal cancer 04/04/2019   Encounter for gynecological examination with Papanicolaou smear of cervix 04/04/2019   Routine cervical smear 04/04/2019   Vaginal dryness, menopausal 04/04/2019   Hot flashes 04/04/2019   Psoriatic arthritis ([place]) 12/26/2018   History of colonic polyps 12/26/2018   Pain of upper abdomen 12/26/2018   Abdominal pain 06/23/2014   Acute gastroenteritis 06/23/2014   Back pain with radiation 10/24/2012   Breast mass 10/09/2012   Seasonal allergies 09/22/2012   Hypokalemia 09/22/2012   Dysmenorrhea 08/30/2012   Sciatica 05/29/2012   Sinusitis 03/02/2012   Back pain 02/10/2012   Menorrhagia 12/30/2011   Shoulder pain, left 12/03/2011   Costochondritis 11/17/2011   Shoulder bursitis 11/17/2011   Recurrent UTI 10/09/2011   Migraine headache 10/08/2011   Obesity 09/04/2011   HTN (hypertension) 07/14/2011   Anxiety 07/14/2011   Diarrhea 05/19/2011   Chest pain 04/30/2011   IBS (irritable bowel syndrome) 09/23/2010   Epigastric pain 09/23/2010   CONSTIPATION 01/29/2010   GERD 01/24/2010    Past Surgical History:  Procedure Laterality Date   ANTERIOR CERVICAL DECOMP/DISCECTOMY FUSION  02/26/2014   C5 - C6; fusion with plate  BIOPSY  01/04/2019   Procedure: BIOPSY;  Surgeon: Rogene Houston, MD;  [facility];  Service: Endoscopy;;  gastric polyps   CARPAL TUNNEL RELEASE Bilateral right 04-01-2010/  left 05-30-2010   CHOLECYSTECTOMY N/A 07/14/2013   Procedure: LAPAROSCOPIC CHOLECYSTECTOMY;  Surgeon: Jamesetta So, MD;  Location: [facility];  Service: General;  Laterality: N/A;   COLONOSCOPY  10-13-2010   COLONOSCOPY N/A 01/04/2019   Procedure: COLONOSCOPY;  Surgeon: Rogene Houston, MD;  [facility]: [facility];  Service: Endoscopy;  Laterality: N/A;   CYSTO WITH HYDRODISTENSION N/A 04/02/2015   Procedure:  CYSTOSCOPY/HYDRODISTENSION, BLADDER BIOPSY WITH FULGURATION;  Surgeon: Irine Seal, MD;  Location: [facility];  Service: Urology;  Laterality: N/A;   CYSTO WITH HYDRODISTENSION N/A 03/25/2018   Procedure: CYSTOSCOPY/HYDRODISTENSION, INSTILL MARCAIN AND PYRIDIUM;  Surgeon: Irine Seal, MD;  Location: [facility];  Service: Urology;  Laterality: N/A;   CYSTO WITH HYDRODISTENSION N/A 01/15/2020   Procedure: CYSTOSCOPY/HYDRODISTENSION;  Surgeon: Cleon Gustin, MD;  Location: [facility];  Service: Urology;  Laterality: N/A;   CYSTOSCOPY W/ URETERAL STENT PLACEMENT Bilateral 04/08/2016   Procedure: CYSTOSCOPY WITH BILATERAL RETROGRADE PYELOGRAM, BILATERAL URETERAL STENT PLACEMENT;  Surgeon: Cleon Gustin, MD;  Location: [facility];  Service: Urology;  Laterality: Bilateral;   CYSTOSCOPY W/ URETERAL STENT PLACEMENT Left 04/15/2016   Procedure: CYSTOSCOPY WITH RETROGRADE PYELOGRAM/URETERAL STENT PLACEMENT;  Surgeon: Cleon Gustin, MD;  Location: [facility];  Service: Urology;  Laterality: Left;   CYSTOSCOPY WITH BIOPSY N/A 01/15/2020   Procedure: CYSTOSCOPY WITH BLADDER BIOPSY AND FULGERATION;  Surgeon: Cleon Gustin, MD;  Location: [facility];  Service: Urology;  Laterality: N/A;   CYSTOSCOPY WITH HOLMIUM LASER LITHOTRIPSY Right 04/08/2016   Procedure: CYSTOSCOPY WITH RIGHT RENAL STONE EXTRACTION WITH HOLMIUM LASER LITHOTRIPSY;  Surgeon: Cleon Gustin, MD;  Location: [facility];  Service: Urology;  Laterality: Right;   CYSTOSCOPY WITH HOLMIUM LASER LITHOTRIPSY Left 04/15/2016   Procedure: CYSTOSCOPY WITH HOLMIUM LASER LITHOTRIPSY;  Surgeon: Cleon Gustin, MD;  Location: [facility];  Service: Urology;  Laterality: Left;   CYSTOSCOPY WITH RETROGRADE PYELOGRAM, URETEROSCOPY AND STENT PLACEMENT Left 07/05/2017   Procedure: CYSTOSCOPY WITH RETROGRADE PYELOGRAM, URETEROSCOPY AND STENT PLACEMENT;  Surgeon: Cleon Gustin, MD;  Location: [facility];  Service: Urology;  Laterality: Left;   DIAGNOSTIC  LAPAROSCOPY     DILATION AND CURETTAGE OF UTERUS     DILITATION & CURRETTAGE/HYSTROSCOPY WITH THERMACHOICE ABLATION N/A 09/07/2012   Procedure: DILATATION & CURETTAGE/HYSTEROSCOPY WITH THERMACHOICE ABLATION;  Surgeon: Florian Buff, MD;  Location: [facility];  Service: Gynecology;  Laterality: N/A;   ESOPHAGOGASTRODUODENOSCOPY  10/14/10   ESOPHAGOGASTRODUODENOSCOPY N/A 01/04/2019   Procedure: ESOPHAGOGASTRODUODENOSCOPY (EGD);  Surgeon: Rogene Houston, MD;  [facility];  Service: Endoscopy;  Laterality: N/A;  9:30   EXTRACORPOREAL SHOCK WAVE LITHOTRIPSY  multiple   HOLMIUM LASER APPLICATION Left 09/22/7351   Procedure: HOLMIUM LASER APPLICATION;  Surgeon: Cleon Gustin, MD;  Location: [facility];  Service: Urology;  Laterality: Left;   PERCUTANEOUS NEPHROSTOLITHOTOMY  2003   PLANTAR FASCIA SURGERY Bilateral right 2008//  left 2010   POLYPECTOMY  01/04/2019   Procedure: POLYPECTOMY;  Surgeon: Rogene Houston, MD;  [facility];  Service: Endoscopy;;  colon   STONE EXTRACTION WITH BASKET Left 04/15/2016   Procedure: STONE EXTRACTION WITH BASKET;  Surgeon: Cleon Gustin, MD;  Location: [facility];  Service: Urology;  Laterality: Left;     OB History  Gravida  0   Para      Term      Preterm      AB      Living  0      SAB      IAB      Ectopic      Multiple      Live Births              Family History  Problem Relation Age of Onset   Irritable bowel syndrome Mother    Scleroderma Mother    Rheum arthritis Mother    Asthma Mother    Melanoma Father    Cancer Father        Melanoma   Colon cancer Cousin 70       second cousin Sunday Spillers Lovelace)   Cancer Maternal Grandmother        Ovarian   Diabetes Maternal Grandmother    Thyroid disease Maternal Aunt     Social History   Tobacco Use   Smoking status: Never   Smokeless tobacco: Never  Vaping Use   Vaping Use: Never used  Substance Use Topics   Alcohol use: No   Drug use: No     Home Medications Prior to Admission medications   Medication Sig Start Date End Date Taking? Authorizing Provider  ALPRAZolam Duanne Moron) 0.5 MG tablet Take 0.5 mg by mouth 3 (three) times daily as needed for anxiety.  11/10/19   [provider]  conjugated estrogens (PREMARIN) vaginal cream Use 0.5 gm daily for 2 weeks then 2-3 x a week 02/27/20   Derrek Monaco A, NP  COSENTYX, 300 MG DOSE, 150 MG/ML SOSY Inject 300 mg into the skin every 30 (thirty) days. 12/11/19   [provider]  DULoxetine (CYMBALTA) 60 MG capsule Take 60 mg by mouth daily.    [provider]  estradiol (ESTRACE) 1 MG tablet TAKE 1 TABLET(1 MG) BY MOUTH DAILY 03/05/21   Derrek Monaco A, NP  fluticasone (FLONASE) 50 MCG/ACT nasal spray Place into both nostrils. 03/04/21   [provider]  HYDROcodone-acetaminophen (NORCO) 10-325 MG tablet Take 1 tablet by mouth 4 (four) times daily as needed for moderate pain. 01/15/20   McKenzie, Candee Furbish, MD  HYDROcodone-acetaminophen (NORCO) 5-325 MG tablet Take 1 tablet by mouth every 6 (six) hours as needed for moderate pain. 01/28/21   Franchot Gallo, MD  hydrOXYzine (ATARAX/VISTARIL) 10 MG tablet Take 1 tablet (10 mg total) by mouth at bedtime. 03/12/21   McKenzie, Candee Furbish, MD  ibuprofen (ADVIL) 200 MG tablet Take 200 mg by mouth in the morning and at bedtime.     [provider]  nitrofurantoin (MACRODANTIN) 50 MG capsule Take 1 capsule (50 mg total) by mouth at bedtime. 03/12/21   McKenzie, Candee Furbish, MD  pentosan polysulfate (ELMIRON) 100 MG capsule TAKE 1 CAPSULE(100 MG) BY MOUTH Two TIMES DAILY 03/12/21   McKenzie, Candee Furbish, MD  potassium chloride SA (K-DUR,KLOR-CON) 20 MEQ tablet Take 40 mEq by mouth 2 (two) times daily.     [provider]  progesterone (PROMETRIUM) 200 MG capsule TAKE 1 CAPSULE(200 MG) BY MOUTH AT BEDTIME 02/18/21   Estill Dooms, NP  tiZANidine (ZANAFLEX) 4 MG tablet Take by mouth. 04/02/20    [provider]  TREMFYA 100 MG/ML SOSY Inject into the skin. 02/17/21   [provider]  triamterene-hydrochlorothiazide (DYAZIDE) 37.5-25 MG capsule Take 1 capsule by mouth daily.  01/09/18   [provider]  Vibegron (GEMTESA) 75 MG TABS Take 1 tablet by mouth daily. 09/09/20   McKenzie, Candee Furbish, MD  Adalimumab (HUMIRA PEN) 40 MG/0.4ML PNKT Inject 40 mg into the skin every 14 (fourteen) days. Twice a month   08/09/19  [provider]  pantoprazole (PROTONIX) 40 MG tablet Take 1 tablet (40 mg total) by mouth daily before breakfast. 01/04/19 08/09/19  Rogene Houston, MD    Allergies    Hydromorphone, Hydromorphone hcl, Prednisone, Tramadol, Tramadol hcl, and Sulfa antibiotics  Review of Systems   Review of Systems  Constitutional:  Negative for activity change, appetite change and fever.  HENT:  Negative for congestion.   Respiratory:  Negative for cough, chest tightness and shortness of breath.   Cardiovascular:  Negative for chest pain.  Gastrointestinal:  Negative for abdominal pain, nausea and vomiting.  Genitourinary:  Negative for dysuria and hematuria.  Musculoskeletal:  Negative for arthralgias, back pain and myalgias.  Skin:  Positive for wound.  Neurological:  Negative for dizziness, weakness and headaches.   all other systems are negative except as noted in the HPI and PMH.   Physical Exam Updated Vital Signs BP 130/64 (BP Location: Left Arm)   Pulse 80   Temp 98.4 F (36.9 C) (Oral)   Resp 16   Ht 5\' 2"  (1.575 m)   Wt 77.1 kg   SpO2 97%   BMI 31.09 kg/m   Physical Exam Vitals and nursing note reviewed.  Constitutional:      General: She is not in acute distress.    Appearance: She is well-developed.  HENT:     Head: Normocephalic and atraumatic.     Mouth/Throat:     Pharynx: No oropharyngeal exudate.  Eyes:     Conjunctiva/sclera: Conjunctivae normal.     Pupils: Pupils are equal, round, and reactive to light.  Neck:      Comments: No meningismus. Cardiovascular:     Rate and Rhythm: Normal rate and regular rhythm.     Heart sounds: Normal heart sounds. No murmur heard. Pulmonary:     Effort: Pulmonary effort is normal. No respiratory distress.     Breath sounds: Normal breath sounds.  Abdominal:     Palpations: Abdomen is soft.     Tenderness: There is no abdominal tenderness. There is no guarding or rebound.  Musculoskeletal:        General: Swelling and tenderness present. Normal range of motion.     Cervical back: Normal range of motion and neck supple.     Comments: 2 puncture wounds to palmar surface of proximal phalanx of index finger on right.  Small puncture wound to dorsal surface.  Minimal surrounding erythema.  Full range of motion of DIP, PIP and MCP joints No tenderness along flexor tendon sheath  Skin:    General: Skin is warm.  Neurological:     Mental Status: She is alert and oriented to person, place, and time.     Cranial Nerves: No cranial nerve deficit.     Motor: No abnormal muscle tone.     Coordination: Coordination normal.     Comments:  5/5 strength throughout. CN 2-12 intact.Equal grip strength.   Psychiatric:        Behavior: Behavior normal.    ED Results / Procedures / Treatments   Labs (all labs ordered are listed, but only abnormal results are displayed) Labs Reviewed - No data to display  EKG None  Radiology DG Finger Index Right  Result Date: 03/31/2021  CLINICAL DATA:  Cat bite to the second digit with pain and swelling, initial encounter EXAM: RIGHT INDEX FINGER 2+V COMPARISON:  None. FINDINGS: Generalized soft tissue swelling in the second digit is seen. No acute fracture is noted. Tiny radiopaque densities are noted along the proximal phalanx which may represent small foreign bodies. Correlation with the site of injury is recommended. IMPRESSION: No acute bony abnormality is noted. Soft tissue swelling is noted with tiny radiopaque foreign bodies adjacent to  the proximal phalanx. Correlate with the site of actual wound. Electronically Signed   By: Inez Catalina M.D.   On: 03/31/2021 00:58    Procedures Procedures   Medications Ordered in ED Medications  Tdap (BOOSTRIX) injection 0.5 mL (has no administration in time range)  amoxicillin-clavulanate (AUGMENTIN) 875-125 MG per tablet 1 tablet (has no administration in time range)  rabies immune globulin (HYPERAB/KEDRAB) injection 1,575 Units (has no administration in time range)  rabies vaccine (RABAVERT) injection 1 mL (has no administration in time range)    ED Course  I have reviewed the triage vital signs and the nursing notes.  Pertinent labs & imaging results that were available during my care of the patient were reviewed by me and considered in my medical decision making (see chart for details).    MDM Rules/Calculators/A&P                          Cat bite, unknown vaccination status.  Neurovascularly intact.  Update tetanus, clean wounds, antibiotics, rabies prophylaxis.  X-ray negative.  Questionable tiny foreign body is not an area of wound.  Wound irrigated.  Antibiotics, tetanus, rabies prophylaxis.  Patient to follow-up with urgent care for further doses of rabies vaccines.   Discussed NSAIDs, ice, elevation, antibiotics, urgent care follow-up for further rabies vaccination course.  Return precautions discussed. Final Clinical Impression(s) / ED Diagnoses Final diagnoses:  Cat bite, initial encounter  Need for post exposure prophylaxis for rabies    Rx / DC Orders ED Discharge Orders     None        Rain Friedt, Annie Main, MD 03/31/21 [phone]

## 2021-04-03 ENCOUNTER — Ambulatory Visit
Admission: RE | Admit: 2021-04-03 | Discharge: 2021-04-03 | Disposition: A | Discharge status: Home / Self Care | Payer: Medicare HMO | Source: Ambulatory Visit | Attending: Emergency Medicine | Admitting: Emergency Medicine

## 2021-04-03 ENCOUNTER — Other Ambulatory Visit: Payer: Self-pay

## 2021-04-03 DIAGNOSIS — Z203 Contact with and (suspected) exposure to rabies: Secondary | ICD-10-CM | POA: Diagnosis not present

## 2021-04-03 MED ORDER — RABIES VACCINE, PCEC IM SUSR
1.0000 mL | Freq: Once | INTRAMUSCULAR | Status: AC
  Administered 2021-04-03: 1 mL via INTRAMUSCULAR

## 2021-04-03 NOTE — ED Notes (Signed)
Rabies vaccine administered, tolerated well. Instructed patient to return for 3rd dose on day 7. Pt verbalized understanding.

## 2021-04-03 NOTE — ED Triage Notes (Signed)
Patient presents to Urgent Care for 2nd dose rabies vaccine. Pt initial rabies vaccine completed at the ED 10/16. Pt states vaccine was tolerated.

## 2021-04-07 ENCOUNTER — Ambulatory Visit
Admission: RE | Admit: 2021-04-07 | Discharge: 2021-04-07 | Disposition: A | Discharge status: Home / Self Care | Payer: Medicare HMO | Source: Ambulatory Visit | Attending: Internal Medicine | Admitting: Internal Medicine

## 2021-04-07 ENCOUNTER — Other Ambulatory Visit: Payer: Self-pay

## 2021-04-07 DIAGNOSIS — Z203 Contact with and (suspected) exposure to rabies: Secondary | ICD-10-CM

## 2021-04-07 MED ORDER — RABIES VACCINE, PCEC IM SUSR
1.0000 mL | Freq: Once | INTRAMUSCULAR | Status: AC
  Administered 2021-04-07: 1 mL via INTRAMUSCULAR

## 2021-04-07 NOTE — ED Triage Notes (Signed)
Pt is present today for 3rd vaccine

## 2021-04-10 ENCOUNTER — Other Ambulatory Visit: Payer: Self-pay

## 2021-04-10 ENCOUNTER — Ambulatory Visit (INDEPENDENT_AMBULATORY_CARE_PROVIDER_SITE_OTHER): Payer: Medicare HMO | Admitting: Adult Health

## 2021-04-10 ENCOUNTER — Encounter: Payer: Self-pay | Admitting: Adult Health

## 2021-04-10 VITALS — BP 123/79 | HR 80 | Ht 63.0 in | Wt 177.0 lb

## 2021-04-10 DIAGNOSIS — Z79890 Hormone replacement therapy (postmenopausal): Secondary | ICD-10-CM | POA: Diagnosis not present

## 2021-04-10 DIAGNOSIS — Z1211 Encounter for screening for malignant neoplasm of colon: Secondary | ICD-10-CM | POA: Diagnosis not present

## 2021-04-10 DIAGNOSIS — Z1231 Encounter for screening mammogram for malignant neoplasm of breast: Secondary | ICD-10-CM | POA: Diagnosis not present

## 2021-04-10 DIAGNOSIS — Z01419 Encounter for gynecological examination (general) (routine) without abnormal findings: Secondary | ICD-10-CM

## 2021-04-10 LAB — HEMOCCULT GUIAC POC 1CARD (OFFICE): Fecal Occult Blood, POC: NEGATIVE

## 2021-04-10 MED ORDER — PROGESTERONE 200 MG PO CAPS: ORAL_CAPSULE | ORAL | 12 refills | Status: DC

## 2021-04-10 MED ORDER — ESTRADIOL 1 MG PO TABS: ORAL_TABLET | ORAL | 12 refills | Status: DC

## 2021-04-10 MED ORDER — PREMARIN 0.625 MG/GM VA CREA: TOPICAL_CREAM | VAGINAL | 3 refills | Status: DC

## 2021-04-10 NOTE — Progress Notes (Signed)
Patient ID: Bethany King, female   DOB: June 21, 1970, 50 y.o.   MRN: [id number] History of Present Illness: Bethany King is a 50 year old white female,divorced, G0P0, in for a well woman gyn exam. PCP is Dr Sherwood Gambler.  Lab Results  Component Value Date   DIAGPAP  04/04/2019    - Negative for intraepithelial lesion or malignancy (NILM)   HPVHIGH Negative 04/04/2019    Current Medications, Allergies, Past Medical History, Past Surgical History, Family History and Social History were reviewed in [employer] record.     Review of Systems: Patient denies any headaches, hearing loss, fatigue, blurred vision, shortness of breath, chest pain, abdominal pain, problems with bowel movements, or intercourse. No joint pain or mood swings. Has body aches, due to arthritis.  She has OAB and IC and sees urologist regularly, but it is better than it was. She is sp ablation but having regular periods, may have BTB if used PVC.  Physical Exam:BP 123/79 (BP Location: Right Arm, Patient Position: Sitting, Cuff Size: Normal)   Pulse 80   Ht 5\' 3"  (1.6 m)   Wt 177 lb (80.3 kg)   LMP 03/16/2021 (Approximate) Comment: had ablation  BMI 31.35 kg/m   General:  Well developed, well nourished, no acute distress Skin:  Warm and dry Neck:  Midline trachea, normal thyroid, good ROM, no lymphadenopathy Lungs; Clear to auscultation bilaterally Breast:  No dominant palpable mass, retraction, or nipple discharge Cardiovascular: Regular rate and rhythm Abdomen:  Soft, non tender, no hepatosplenomegaly Pelvic:  External genitalia is normal in appearance, no lesions.  The vagina is normal in appearance. Urethra has no lesions or masses. The cervix is smooth.  Uterus is felt to be normal size, shape, and contour.  No adnexal masses or tenderness noted.Bladder is non tender, no masses felt. Rectal: Good sphincter tone, no polyps, or hemorrhoids felt.  Hemoccult negative. Extremities/musculoskeletal:  No  swelling or varicosities noted, no clubbing or cyanosis Psych:  No mood changes, alert and cooperative,seems happy AA is 0  Fall risk is low Depression screen [facility] 2/9 04/10/2021 04/09/2020 04/04/2019  Decreased Interest 0 0 1  Down, Depressed, Hopeless 0 1 2  PHQ - 2 Score 0 1 3  Altered sleeping 2 1 3   Tired, decreased energy 0 1 2  Change in appetite 0 0 2  Feeling bad or failure about yourself  0 0 0  Trouble concentrating 0 1 2  Moving slowly or fidgety/restless 0 0 2  Suicidal thoughts 0 0 0  PHQ-9 Score 2 4 14   Some recent data might be hidden    GAD 7 : Generalized Anxiety Score 04/10/2021 04/09/2020  Nervous, Anxious, on Edge 1 1  Control/stop worrying 1 1  Worry too much - different things 1 1  Trouble relaxing 1 -  Restless 1 0  Easily annoyed or irritable 1 1  Afraid - awful might happen 1 0  Total GAD 7 Score 7 -      Upstream - 04/10/21 1339       Pregnancy Intention Screening   Does the patient want to become pregnant in the next year? No    Does the patient's partner want to become pregnant in the next year? No    Would the patient like to discuss contraceptive options today? No      Contraception Wrap Up   Current Method No Method - Other Reason   ablation   End Method No Method - Other Reason  ablation   Contraception Counseling Provided No            Examination chaperoned by Faith Rogue LPN  Impression and Plan: 1. Encounter for well woman exam with routine gynecological exam Pap and physical in 1 year Labs with PCP Colonoscopy per GI  2. Encounter for screening fecal occult blood testing  3. Hormone replacement therapy (HRT) Will continue HRT Meds ordered this encounter  Medications   estradiol (ESTRACE) 1 MG tablet    Sig: TAKE 1 TABLET(1 MG) BY MOUTH DAILY    Dispense:  30 tablet    Refill:  12    ZERO refills remain on this prescription. Your patient is requesting advance approval of refills for this medication to PREVENT ANY  MISSED DOSES    Order Specific Question:   Supervising Provider    Answer:   Duane Lope H [[id number]]   progesterone (PROMETRIUM) 200 MG capsule    Sig: TAKE 1 CAPSULE(200 MG) BY MOUTH AT BEDTIME    Dispense:  30 capsule    Refill:  12    ZERO refills remain on this prescription. Your patient is requesting advance approval of refills for this medication to PREVENT ANY MISSED DOSES    Order Specific Question:   Supervising Provider    Answer:   Duane Lope H [[id number]]   conjugated estrogens (PREMARIN) vaginal cream    Sig: Use 0.5 gm daily for 2 weeks then 2-3 x a week    Dispense:  42.5 g    Refill:  3    Order Specific Question:   Supervising Provider    Answer:   EURE, LUTHER H [[id number]]     4. Screening mammogram for breast cancer Pt will call for appt

## 2021-04-15 ENCOUNTER — Ambulatory Visit
Admission: RE | Admit: 2021-04-15 | Discharge: 2021-04-15 | Disposition: A | Discharge status: Home / Self Care | Payer: Medicare HMO | Source: Ambulatory Visit | Attending: Family Medicine | Admitting: Family Medicine

## 2021-04-15 ENCOUNTER — Other Ambulatory Visit: Payer: Self-pay

## 2021-04-15 DIAGNOSIS — Z203 Contact with and (suspected) exposure to rabies: Secondary | ICD-10-CM

## 2021-04-15 DIAGNOSIS — Z20822 Contact with and (suspected) exposure to covid-19: Secondary | ICD-10-CM | POA: Diagnosis not present

## 2021-04-15 MED ORDER — RABIES VACCINE, PCEC IM SUSR
1.0000 mL | Freq: Once | INTRAMUSCULAR | Status: AC
  Administered 2021-04-15: 1 mL via INTRAMUSCULAR

## 2021-04-15 NOTE — ED Triage Notes (Signed)
Patient presents to Urgent Care for 4th dose of rabies vaccine. Pt states she tolerated previous vaccines well.

## 2021-04-15 NOTE — ED Notes (Signed)
Pt received final rabies vaccine. Vaccine administered in Right deltoid. Tolerated well. EDU given to pt. She voiced understanding.

## 2021-04-16 DIAGNOSIS — Z20822 Contact with and (suspected) exposure to covid-19: Secondary | ICD-10-CM | POA: Diagnosis not present

## 2021-04-17 DIAGNOSIS — Z20822 Contact with and (suspected) exposure to covid-19: Secondary | ICD-10-CM | POA: Diagnosis not present

## 2021-05-02 ENCOUNTER — Ambulatory Visit
Admission: RE | Admit: 2021-05-02 | Discharge: 2021-05-02 | Disposition: A | Discharge status: Home / Self Care | Payer: Medicare HMO | Source: Ambulatory Visit | Attending: Family Medicine | Admitting: Family Medicine

## 2021-05-02 ENCOUNTER — Other Ambulatory Visit: Payer: Self-pay

## 2021-05-02 VITALS — BP 125/76 | HR 69 | Temp 98.7°F | Resp 14

## 2021-05-02 DIAGNOSIS — J0100 Acute maxillary sinusitis, unspecified: Secondary | ICD-10-CM | POA: Diagnosis not present

## 2021-05-02 MED ORDER — AMOXICILLIN-POT CLAVULANATE 875-125 MG PO TABS: 1.0000 | ORAL_TABLET | Freq: Two times a day (BID) | ORAL | 0 refills | Status: DC

## 2021-05-02 NOTE — ED Triage Notes (Signed)
Pt presents with concern for sinus infection. Pt states she has had joint pain, headache, congestion, and facial pain x6 days

## 2021-05-02 NOTE — ED Provider Notes (Signed)
[facility]    CSN: [id number] Arrival date & time: 05/02/21  1035      History   Chief Complaint Chief Complaint  Patient presents with   Headache    APPT 11AM    sinus pressure   APPT 11AM    HPI Bethany King is a 50 y.o. female.   Patient presenting today with over a week of initially fever, chills, body aches, congestion, cough, fatigue but now mainly feels that it is settling into her sinuses.  Having sinus headaches, congestion, facial pain and pressure.  Denies fever the past few days, difficulty breathing, abdominal pain, nausea vomiting diarrhea.  Has been taking Mucinex and Tylenol with minimal temporary relief.  History of seasonal allergies on hydroxyzine and Flonase.  Also history of psoriatic arthritis but states her current joint pains are above baseline.  No known sick contacts recently.   Past Medical History:  Diagnosis Date   Allergic rhinitis    Anxiety    Arthritis    Bladder pain    Complication of anesthesia    Costochondritis    hx   DDD (degenerative disc disease), lumbar    Depression    GERD (gastroesophageal reflux disease)    Hepatic hemangioma 2006   stable   History of adenomatous polyp of colon    History of colitis    History of gastritis    History of kidney stones    HTN (hypertension)    Irritable bowel syndrome (IBS)    Medullary sponge kidney    left   Migraine    Nephrolithiasis    bilateral-- nonobstructive per CT   PONV (postoperative nausea and vomiting)    Psoriatic arthritis ([place])    Psoriatic arthritis ([place])    Spinal stenosis    Urgency of urination     Patient Active Problem List   Diagnosis Date Noted   Overactive bladder 09/09/2020   Encounter for screening fecal occult blood testing 04/09/2020   Encounter for well woman exam with routine gynecological exam 04/09/2020   Hormone replacement therapy (HRT) 04/09/2020   Pelvic pain in female 04/05/2020   Urinary urgency 04/05/2020    Vaginal dryness 02/27/2020   Burning with urination 02/27/2020   Vaginal burning 02/27/2020   Chronic interstitial cystitis 02/09/2020   Menopause 04/04/2019   Screening for colorectal cancer 04/04/2019   Encounter for gynecological examination with Papanicolaou smear of cervix 04/04/2019   Routine cervical smear 04/04/2019   Vaginal dryness, menopausal 04/04/2019   Hot flashes 04/04/2019   Psoriatic arthritis ([place]) 12/26/2018   History of colonic polyps 12/26/2018   Pain of upper abdomen 12/26/2018   Abdominal pain 06/23/2014   Acute gastroenteritis 06/23/2014   Back pain with radiation 10/24/2012   Breast mass 10/09/2012   Seasonal allergies 09/22/2012   Hypokalemia 09/22/2012   Dysmenorrhea 08/30/2012   Sciatica 05/29/2012   Sinusitis 03/02/2012   Back pain 02/10/2012   Menorrhagia 12/30/2011   Shoulder pain, left 12/03/2011   Costochondritis 11/17/2011   Shoulder bursitis 11/17/2011   Recurrent UTI 10/09/2011   Migraine headache 10/08/2011   Obesity 09/04/2011   HTN (hypertension) 07/14/2011   Anxiety 07/14/2011   Diarrhea 05/19/2011   Chest pain 04/30/2011   IBS (irritable bowel syndrome) 09/23/2010   Epigastric pain 09/23/2010   CONSTIPATION 01/29/2010   GERD 01/24/2010    Past Surgical History:  Procedure Laterality Date   ANTERIOR CERVICAL DECOMP/DISCECTOMY FUSION  02/26/2014   C5 - C6; fusion with plate  BIOPSY  01/04/2019   Procedure: BIOPSY;  Surgeon: Rogene Houston, MD;  [facility];  Service: Endoscopy;;  gastric polyps   CARPAL TUNNEL RELEASE Bilateral right 04-01-2010/  left 05-30-2010   CHOLECYSTECTOMY N/A 07/14/2013   Procedure: LAPAROSCOPIC CHOLECYSTECTOMY;  Surgeon: Jamesetta So, MD;  Location: [facility];  Service: General;  Laterality: N/A;   COLONOSCOPY  10-13-2010   COLONOSCOPY N/A 01/04/2019   Procedure: COLONOSCOPY;  Surgeon: Rogene Houston, MD;  [facility];  Service: Endoscopy;  Laterality: N/A;   CYSTO WITH  HYDRODISTENSION N/A 04/02/2015   Procedure: CYSTOSCOPY/HYDRODISTENSION, BLADDER BIOPSY WITH FULGURATION;  Surgeon: Irine Seal, MD;  Location[facility];  Service: Urology;  Laterality: N/A;   CYSTO WITH HYDRODISTENSION N/A 03/25/2018   Procedure: CYSTOSCOPY/HYDRODISTENSION, INSTILL MARCAIN AND PYRIDIUM;  Surgeon: Irine Seal, MD;  [facility]: [facility];  Service: Urology;  Laterality: N/A;   CYSTO WITH HYDRODISTENSION N/A 01/15/2020   Procedure: CYSTOSCOPY/HYDRODISTENSION;  Surgeon: Cleon Gustin, MD;  Location: [facility];  Service: Urology;  Laterality: N/A;   CYSTOSCOPY W/ URETERAL STENT PLACEMENT Bilateral 04/08/2016   Procedure: CYSTOSCOPY WITH BILATERAL RETROGRADE PYELOGRAM, BILATERAL URETERAL STENT PLACEMENT;  Surgeon: Cleon Gustin, MD;  Location: [facility];  Service: Urology;  Laterality: Bilateral;   CYSTOSCOPY W/ URETERAL STENT PLACEMENT Left 04/15/2016   Procedure: CYSTOSCOPY WITH RETROGRADE PYELOGRAM/URETERAL STENT PLACEMENT;  Surgeon: Cleon Gustin, MD;  [facility];  Service: Urology;  Laterality: Left;   CYSTOSCOPY WITH BIOPSY N/A 01/15/2020   Procedure: CYSTOSCOPY WITH BLADDER BIOPSY AND FULGERATION;  Surgeon: Cleon Gustin, MD;  [facility];  Service: Urology;  Laterality: N/A;   CYSTOSCOPY WITH HOLMIUM LASER LITHOTRIPSY Right 04/08/2016   Procedure: CYSTOSCOPY WITH RIGHT RENAL STONE EXTRACTION WITH HOLMIUM LASER LITHOTRIPSY;  Surgeon: Cleon Gustin, MD;  [facility];  Service: Urology;  Laterality: Right;   CYSTOSCOPY WITH HOLMIUM LASER LITHOTRIPSY Left 04/15/2016   Procedure: CYSTOSCOPY WITH HOLMIUM LASER LITHOTRIPSY;  Surgeon: Cleon Gustin, MD;  [facility];  Service: Urology;  Laterality: Left;   CYSTOSCOPY WITH RETROGRADE PYELOGRAM, URETEROSCOPY AND STENT PLACEMENT Left 07/05/2017   Procedure: CYSTOSCOPY WITH RETROGRADE PYELOGRAM, URETEROSCOPY AND STENT PLACEMENT;  Surgeon: Cleon Gustin, MD;  [facility];  Service:  Urology;  Laterality: Left;   DIAGNOSTIC LAPAROSCOPY     DILATION AND CURETTAGE OF UTERUS     DILITATION & CURRETTAGE/HYSTROSCOPY WITH THERMACHOICE ABLATION N/A 09/07/2012   Procedure: DILATATION & CURETTAGE/HYSTEROSCOPY WITH THERMACHOICE ABLATION;  Surgeon: Florian Buff, MD;  [facility];  Service: Gynecology;  Laterality: N/A;   ESOPHAGOGASTRODUODENOSCOPY  10/14/10   ESOPHAGOGASTRODUODENOSCOPY N/A 01/04/2019   Procedure: ESOPHAGOGASTRODUODENOSCOPY (EGD);  Surgeon: Rogene Houston, MD;  [facility];  Service: Endoscopy;  Laterality: N/A;  9:30   EXTRACORPOREAL SHOCK WAVE LITHOTRIPSY  multiple   HOLMIUM LASER APPLICATION Left 11/26/4313   Procedure: HOLMIUM LASER APPLICATION;  Surgeon: Cleon Gustin, MD;  Location: [facility];  Service: Urology;  Laterality: Left;   PERCUTANEOUS NEPHROSTOLITHOTOMY  2003   PLANTAR FASCIA SURGERY Bilateral right 2008//  left 2010   POLYPECTOMY  01/04/2019   Procedure: POLYPECTOMY;  Surgeon: Rogene Houston, MD;  [facility];  Service: Endoscopy;;  colon   STONE EXTRACTION WITH BASKET Left 04/15/2016   Procedure: STONE EXTRACTION WITH BASKET;  Surgeon: Cleon Gustin, MD;  Location: [facility];  Service: Urology;  Laterality: Left;    OB History     Gravida  0   Para      Term      Preterm      AB      Living  0      SAB      IAB      Ectopic      Multiple      Live Births               Home Medications    Prior to Admission medications   Medication Sig Start Date End Date Taking? Authorizing Provider  amoxicillin-clavulanate (AUGMENTIN) 875-125 MG tablet Take 1 tablet by mouth every 12 (twelve) hours. 05/02/21  Yes Volney American, PA-C  ALPRAZolam Duanne Moron) 0.5 MG tablet Take 0.5 mg by mouth 3 (three) times daily as needed for anxiety.  11/10/19   [provider]  conjugated estrogens (PREMARIN) vaginal cream Use 0.5 gm daily for 2 weeks then 2-3 x a week 04/10/21   Estill Dooms,  NP  DULoxetine (CYMBALTA) 60 MG capsule Take 60 mg by mouth daily.    [provider]  estradiol (ESTRACE) 1 MG tablet TAKE 1 TABLET(1 MG) BY MOUTH DAILY 04/10/21   Derrek Monaco A, NP  fluticasone (FLONASE) 50 MCG/ACT nasal spray Place into both nostrils. 03/04/21   [provider]  HYDROcodone-acetaminophen (NORCO) 10-325 MG tablet Take 1 tablet by mouth 4 (four) times daily as needed for moderate pain. 01/15/20   McKenzie, Candee Furbish, MD  hydrOXYzine (ATARAX/VISTARIL) 10 MG tablet Take 1 tablet (10 mg total) by mouth at bedtime. 03/12/21   McKenzie, Candee Furbish, MD  nitrofurantoin (MACRODANTIN) 50 MG capsule Take 1 capsule (50 mg total) by mouth at bedtime. Patient not taking: Reported on 05/02/2021 03/12/21   Cleon Gustin, MD  pentosan polysulfate (ELMIRON) 100 MG capsule TAKE 1 CAPSULE(100 MG) BY MOUTH Two TIMES DAILY 03/12/21   McKenzie, Candee Furbish, MD  potassium chloride SA (K-DUR,KLOR-CON) 20 MEQ tablet Take 40 mEq by mouth 2 (two) times daily.     [provider]  progesterone (PROMETRIUM) 200 MG capsule TAKE 1 CAPSULE(200 MG) BY MOUTH AT BEDTIME 04/10/21   Estill Dooms, NP  TREMFYA 100 MG/ML SOSY Inject into the skin. 02/17/21   [provider]  triamterene-hydrochlorothiazide (DYAZIDE) 37.5-25 MG capsule Take 1 capsule by mouth daily.  01/09/18   [provider]  Adalimumab (HUMIRA PEN) 40 MG/0.4ML PNKT Inject 40 mg into the skin every 14 (fourteen) days. Twice a month   08/09/19  [provider]  pantoprazole (PROTONIX) 40 MG tablet Take 1 tablet (40 mg total) by mouth daily before breakfast. 01/04/19 08/09/19  Rogene Houston, MD    Family History Family History  Problem Relation Age of Onset   Irritable bowel syndrome Mother    Scleroderma Mother    Rheum arthritis Mother    Asthma Mother    Melanoma Father    Cancer Father        Melanoma   Colon cancer Cousin 9       second cousin Sharl Ma)   Cancer  Maternal Grandmother        Ovarian   Diabetes Maternal Grandmother    Thyroid disease Maternal Aunt     Social History Social History   Tobacco Use   Smoking status: Never   Smokeless tobacco: Never  Vaping Use   Vaping Use: Never used  Substance Use Topics   Alcohol use: No   Drug use: No  Allergies   Hydromorphone, Prednisone, Tramadol, and Sulfa antibiotics   Review of Systems Review of Systems Per HPI  Physical Exam Triage Vital Signs ED Triage Vitals  Enc Vitals Group     BP 05/02/21 1122 125/76     Pulse Rate 05/02/21 1122 69     Resp 05/02/21 1122 14     Temp 05/02/21 1122 98.7 F (37.1 C)     Temp Source 05/02/21 1122 Oral     SpO2 05/02/21 1122 98 %     Weight --      Height --      Head Circumference --      Peak Flow --      Pain Score 05/02/21 1123 7     Pain Loc --      Pain Edu? --      Excl. in [place]? --    No data found.  Updated Vital Signs BP 125/76 (BP Location: Right Arm)   Pulse 69   Temp 98.7 F (37.1 C) (Oral)   Resp 14   SpO2 98%   Visual Acuity Right Eye Distance:   Left Eye Distance:   Bilateral Distance:    Right Eye Near:   Left Eye Near:    Bilateral Near:     Physical Exam Vitals and nursing note reviewed.  Constitutional:      Appearance: Normal appearance. She is not ill-appearing.  HENT:     Head: Atraumatic.     Comments: Maxillary sinuses tender to palpation bilaterally    Right Ear: Tympanic membrane normal.     Left Ear: Tympanic membrane normal.     Nose: Congestion present.     Mouth/Throat:     Mouth: Mucous membranes are moist.     Pharynx: Oropharynx is clear. Posterior oropharyngeal erythema present.  Eyes:     Extraocular Movements: Extraocular movements intact.     Conjunctiva/sclera: Conjunctivae normal.  Cardiovascular:     Rate and Rhythm: Normal rate and regular rhythm.     Heart sounds: Normal heart sounds.  Pulmonary:     Effort: Pulmonary effort is normal.     Breath sounds:  Normal breath sounds.  Musculoskeletal:        General: Normal range of motion.     Cervical back: Normal range of motion and neck supple.  Skin:    General: Skin is warm and dry.  Neurological:     Mental Status: She is alert and oriented to person, place, and time.     Motor: No weakness.     Gait: Gait normal.  Psychiatric:        Mood and Affect: Mood normal.        Thought Content: Thought content normal.        Judgment: Judgment normal.     UC Treatments / Results  Labs (all labs ordered are listed, but only abnormal results are displayed) Labs Reviewed - No data to display  EKG   Radiology No results found.  Procedures Procedures (including critical care time)  Medications Ordered in UC Medications - No data to display  Initial Impression / Assessment and Plan / UC Course  I have reviewed the triage vital signs and the nursing notes.  Pertinent labs & imaging results that were available during my care of the patient were reviewed by me and considered in my medical decision making (see chart for details).     Suspect initially viral illness now progressing into a sinus infection.  Continue allergy regimen, sinus rinses, Mucinex and will add Augmentin additionally.  Follow-up for worsening symptoms.  Final Clinical Impressions(s) / UC Diagnoses   Final diagnoses:  Acute non-recurrent maxillary sinusitis   Discharge Instructions   None    ED Prescriptions     Medication Sig Dispense Auth. Provider   amoxicillin-clavulanate (AUGMENTIN) 875-125 MG tablet Take 1 tablet by mouth every 12 (twelve) hours. 14 tablet Volney American, [place]      PDMP not reviewed this encounter.   Volney American, [place] 05/02/21 1217

## 2021-05-12 ENCOUNTER — Other Ambulatory Visit: Payer: Self-pay | Admitting: Urology

## 2021-05-12 DIAGNOSIS — Z6831 Body mass index (BMI) 31.0-31.9, adult: Secondary | ICD-10-CM | POA: Diagnosis not present

## 2021-05-12 DIAGNOSIS — N3281 Overactive bladder: Secondary | ICD-10-CM

## 2021-05-12 DIAGNOSIS — E6609 Other obesity due to excess calories: Secondary | ICD-10-CM | POA: Diagnosis not present

## 2021-05-12 DIAGNOSIS — I10 Essential (primary) hypertension: Secondary | ICD-10-CM | POA: Diagnosis not present

## 2021-05-12 DIAGNOSIS — J9801 Acute bronchospasm: Secondary | ICD-10-CM | POA: Diagnosis not present

## 2021-05-15 DIAGNOSIS — Z20822 Contact with and (suspected) exposure to covid-19: Secondary | ICD-10-CM | POA: Diagnosis not present

## 2021-05-16 DIAGNOSIS — Z20822 Contact with and (suspected) exposure to covid-19: Secondary | ICD-10-CM | POA: Diagnosis not present

## 2021-05-17 DIAGNOSIS — Z20822 Contact with and (suspected) exposure to covid-19: Secondary | ICD-10-CM | POA: Diagnosis not present

## 2021-05-21 DIAGNOSIS — Z79899 Other long term (current) drug therapy: Secondary | ICD-10-CM | POA: Diagnosis not present

## 2021-05-21 DIAGNOSIS — M2550 Pain in unspecified joint: Secondary | ICD-10-CM | POA: Diagnosis not present

## 2021-05-21 DIAGNOSIS — M1990 Unspecified osteoarthritis, unspecified site: Secondary | ICD-10-CM | POA: Diagnosis not present

## 2021-05-21 DIAGNOSIS — N3010 Interstitial cystitis (chronic) without hematuria: Secondary | ICD-10-CM | POA: Diagnosis not present

## 2021-05-21 DIAGNOSIS — L409 Psoriasis, unspecified: Secondary | ICD-10-CM | POA: Diagnosis not present

## 2021-05-21 DIAGNOSIS — L4050 Arthropathic psoriasis, unspecified: Secondary | ICD-10-CM | POA: Diagnosis not present

## 2021-05-21 DIAGNOSIS — M79643 Pain in unspecified hand: Secondary | ICD-10-CM | POA: Diagnosis not present

## 2021-05-21 DIAGNOSIS — M25579 Pain in unspecified ankle and joints of unspecified foot: Secondary | ICD-10-CM | POA: Diagnosis not present

## 2021-05-30 DIAGNOSIS — Z23 Encounter for immunization: Secondary | ICD-10-CM | POA: Diagnosis not present

## 2021-06-04 DIAGNOSIS — U071 COVID-19: Secondary | ICD-10-CM | POA: Diagnosis not present

## 2021-06-16 DIAGNOSIS — Z20822 Contact with and (suspected) exposure to covid-19: Secondary | ICD-10-CM | POA: Diagnosis not present

## 2021-06-17 DIAGNOSIS — Z20822 Contact with and (suspected) exposure to covid-19: Secondary | ICD-10-CM | POA: Diagnosis not present

## 2021-06-18 DIAGNOSIS — Z20822 Contact with and (suspected) exposure to covid-19: Secondary | ICD-10-CM | POA: Diagnosis not present

## 2021-07-07 ENCOUNTER — Ambulatory Visit (HOSPITAL_COMMUNITY): Payer: Medicare HMO

## 2021-07-23 ENCOUNTER — Other Ambulatory Visit: Payer: Self-pay

## 2021-07-23 ENCOUNTER — Ambulatory Visit (HOSPITAL_COMMUNITY)
Admission: RE | Admit: 2021-07-23 | Discharge: 2021-07-23 | Disposition: A | Discharge status: Home / Self Care | Payer: Medicare HMO | Source: Ambulatory Visit | Attending: Adult Health | Admitting: Adult Health

## 2021-07-23 DIAGNOSIS — Z1231 Encounter for screening mammogram for malignant neoplasm of breast: Secondary | ICD-10-CM | POA: Diagnosis not present

## 2021-07-31 DIAGNOSIS — Z1331 Encounter for screening for depression: Secondary | ICD-10-CM | POA: Diagnosis not present

## 2021-07-31 DIAGNOSIS — M5126 Other intervertebral disc displacement, lumbar region: Secondary | ICD-10-CM | POA: Diagnosis not present

## 2021-07-31 DIAGNOSIS — M5010 Cervical disc disorder with radiculopathy, unspecified cervical region: Secondary | ICD-10-CM | POA: Diagnosis not present

## 2021-07-31 DIAGNOSIS — Z0001 Encounter for general adult medical examination with abnormal findings: Secondary | ICD-10-CM | POA: Diagnosis not present

## 2021-07-31 DIAGNOSIS — Z6831 Body mass index (BMI) 31.0-31.9, adult: Secondary | ICD-10-CM | POA: Diagnosis not present

## 2021-07-31 DIAGNOSIS — E559 Vitamin D deficiency, unspecified: Secondary | ICD-10-CM | POA: Diagnosis not present

## 2021-07-31 DIAGNOSIS — E6609 Other obesity due to excess calories: Secondary | ICD-10-CM | POA: Diagnosis not present

## 2021-08-07 DIAGNOSIS — E782 Mixed hyperlipidemia: Secondary | ICD-10-CM | POA: Diagnosis not present

## 2021-08-07 DIAGNOSIS — Z0001 Encounter for general adult medical examination with abnormal findings: Secondary | ICD-10-CM | POA: Diagnosis not present

## 2021-08-07 DIAGNOSIS — E039 Hypothyroidism, unspecified: Secondary | ICD-10-CM | POA: Diagnosis not present

## 2021-08-20 ENCOUNTER — Ambulatory Visit
Admission: RE | Admit: 2021-08-20 | Discharge: 2021-08-20 | Disposition: A | Discharge status: Home / Self Care | Payer: Medicare HMO | Source: Ambulatory Visit | Attending: Urgent Care | Admitting: Urgent Care

## 2021-08-20 ENCOUNTER — Other Ambulatory Visit: Payer: Self-pay

## 2021-08-20 VITALS — BP 128/71 | HR 92 | Temp 98.6°F | Resp 18

## 2021-08-20 DIAGNOSIS — R070 Pain in throat: Secondary | ICD-10-CM | POA: Diagnosis not present

## 2021-08-20 DIAGNOSIS — J069 Acute upper respiratory infection, unspecified: Secondary | ICD-10-CM | POA: Insufficient documentation

## 2021-08-20 DIAGNOSIS — R052 Subacute cough: Secondary | ICD-10-CM | POA: Diagnosis not present

## 2021-08-20 DIAGNOSIS — J3489 Other specified disorders of nose and nasal sinuses: Secondary | ICD-10-CM | POA: Diagnosis not present

## 2021-08-20 DIAGNOSIS — L4050 Arthropathic psoriasis, unspecified: Secondary | ICD-10-CM | POA: Diagnosis not present

## 2021-08-20 LAB — POCT RAPID STREP A (OFFICE): Rapid Strep A Screen: NEGATIVE

## 2021-08-20 MED ORDER — PROMETHAZINE-DM 6.25-15 MG/5ML PO SYRP: 5.0000 mL | ORAL_SOLUTION | Freq: Every evening | ORAL | 0 refills | Status: DC | PRN

## 2021-08-20 MED ORDER — PSEUDOEPHEDRINE HCL 60 MG PO TABS: 60.0000 mg | ORAL_TABLET | Freq: Three times a day (TID) | ORAL | 0 refills | Status: DC | PRN

## 2021-08-20 MED ORDER — LEVOCETIRIZINE DIHYDROCHLORIDE 5 MG PO TABS: 5.0000 mg | ORAL_TABLET | Freq: Every evening | ORAL | 0 refills | Status: DC

## 2021-08-20 MED ORDER — BENZONATATE 100 MG PO CAPS: 100.0000 mg | ORAL_CAPSULE | Freq: Three times a day (TID) | ORAL | 0 refills | Status: DC | PRN

## 2021-08-20 NOTE — ED Provider Notes (Signed)
[facility]   MRN: [id number] DOB: 1971-01-12  Subjective:   Bethany King is a 51 y.o. female presenting for 3-day history of acute onset malaise and fatigue, sinus congestion, runny nose, coughing in the past day, throat pain.  Had exposure to strep about 2 weeks ago.  Would like a COVID test that she is immunocompromise from having psoriatic arthritis.  No chest pain, shortness of breath or wheezing.  Patient is non-smoker.  No history of respiratory disorders.  She does have a history of allergic rhinitis, takes Flonase daily.  Does not tolerate prednisone okay.  No current facility-administered medications for this encounter.  Current Outpatient Medications:    ALPRAZolam (XANAX) 0.5 MG tablet, Take 0.5 mg by mouth 3 (three) times daily as needed for anxiety. , Disp: , Rfl:    amoxicillin-clavulanate (AUGMENTIN) 875-125 MG tablet, Take 1 tablet by mouth every 12 (twelve) hours., Disp: 14 tablet, Rfl: 0   conjugated estrogens (PREMARIN) vaginal cream, Use 0.5 gm daily for 2 weeks then 2-3 x a week, Disp: 42.5 g, Rfl: 3   DULoxetine (CYMBALTA) 60 MG capsule, Take 60 mg by mouth daily., Disp: , Rfl:    estradiol (ESTRACE) 1 MG tablet, TAKE 1 TABLET(1 MG) BY MOUTH DAILY, Disp: 30 tablet, Rfl: 12   fluticasone (FLONASE) 50 MCG/ACT nasal spray, Place into both nostrils., Disp: , Rfl:    HYDROcodone-acetaminophen (NORCO) 10-325 MG tablet, Take 1 tablet by mouth 4 (four) times daily as needed for moderate pain., Disp: 30 tablet, Rfl: 0   hydrOXYzine (ATARAX/VISTARIL) 10 MG tablet, Take 1 tablet (10 mg total) by mouth at bedtime., Disp: 30 tablet, Rfl: 11   nitrofurantoin (MACRODANTIN) 50 MG capsule, Take 1 capsule (50 mg total) by mouth at bedtime. (Patient not taking: Reported on 05/02/2021), Disp: 30 capsule, Rfl: 11   pentosan polysulfate (ELMIRON) 100 MG capsule, TAKE 1 CAPSULE(100 MG) BY MOUTH THREE TIMES DAILY, Disp: 90 capsule, Rfl: 3   potassium chloride SA  (K-DUR,KLOR-CON) 20 MEQ tablet, Take 40 mEq by mouth 2 (two) times daily. , Disp: , Rfl:    progesterone (PROMETRIUM) 200 MG capsule, TAKE 1 CAPSULE(200 MG) BY MOUTH AT BEDTIME, Disp: 30 capsule, Rfl: 12   TREMFYA 100 MG/ML SOSY, Inject into the skin., Disp: , Rfl:    triamterene-hydrochlorothiazide (DYAZIDE) 37.5-25 MG capsule, Take 1 capsule by mouth daily. , Disp: , Rfl: 11   Allergies  Allergen Reactions   Hydromorphone Itching   Prednisone Other (See Comments)    Increase anxiety symptoms, insomnia   Tramadol Other (See Comments)    Causes dizziness Other reaction(s): Severe Dizziness   Sulfa Antibiotics Rash    Past Medical History:  Diagnosis Date   Allergic rhinitis    Anxiety    Arthritis    Bladder pain    Complication of anesthesia    Costochondritis    hx   DDD (degenerative disc disease), lumbar    Depression    GERD (gastroesophageal reflux disease)    Hepatic hemangioma 2006   stable   History of adenomatous polyp of colon    History of colitis    History of gastritis    History of kidney stones    HTN (hypertension)    Irritable bowel syndrome (IBS)    Medullary sponge kidney    left   Migraine    Nephrolithiasis    bilateral-- nonobstructive per CT   PONV (postoperative nausea and vomiting)    Psoriatic arthritis (HCC)  Psoriatic arthritis (HCC)    Spinal stenosis    Urgency of urination      Past Surgical History:  Procedure Laterality Date   ANTERIOR CERVICAL DECOMP/DISCECTOMY FUSION  02/26/2014   C5 - C6; fusion with plate   BIOPSY  01/04/2019   Procedure: BIOPSY;  Surgeon: Malissa Hippo, MD;  [facility];  Service: Endoscopy;;  gastric polyps   CARPAL TUNNEL RELEASE Bilateral right 04-01-2010/  left 05-30-2010   CHOLECYSTECTOMY N/A 07/14/2013   Procedure: LAPAROSCOPIC CHOLECYSTECTOMY;  Surgeon: Dalia Heading, MD;  [facility];  Service: General;  Laterality: N/A;   COLONOSCOPY  10-13-2010   COLONOSCOPY N/A 01/04/2019    Procedure: COLONOSCOPY;  Surgeon: Malissa Hippo, MD;  [facility];  Service: Endoscopy;  Laterality: N/A;   CYSTO WITH HYDRODISTENSION N/A 04/02/2015   Procedure: CYSTOSCOPY/HYDRODISTENSION, BLADDER BIOPSY WITH FULGURATION;  Surgeon: Bjorn Pippin, MD;  Location: [facility];  Service: Urology;  Laterality: N/A;   CYSTO WITH HYDRODISTENSION N/A 03/25/2018   Procedure: CYSTOSCOPY/HYDRODISTENSION, INSTILL MARCAIN AND PYRIDIUM;  Surgeon: Bjorn Pippin, MD;  [facility];  Service: Urology;  Laterality: N/A;   CYSTO WITH HYDRODISTENSION N/A 01/15/2020   Procedure: CYSTOSCOPY/HYDRODISTENSION;  Surgeon: Malen Gauze, MD;  [facility];  Service: Urology;  Laterality: N/A;   CYSTOSCOPY W/ URETERAL STENT PLACEMENT Bilateral 04/08/2016   Procedure: CYSTOSCOPY WITH BILATERAL RETROGRADE PYELOGRAM, BILATERAL URETERAL STENT PLACEMENT;  Surgeon: Malen Gauze, MD;  [facility];  Service: Urology;  Laterality: Bilateral;   CYSTOSCOPY W/ URETERAL STENT PLACEMENT Left 04/15/2016   Procedure: CYSTOSCOPY WITH RETROGRADE PYELOGRAM/URETERAL STENT PLACEMENT;  Surgeon: Malen Gauze, MD;  [facility];  Service: Urology;  Laterality: Left;   CYSTOSCOPY WITH BIOPSY N/A 01/15/2020   Procedure: CYSTOSCOPY WITH BLADDER BIOPSY AND FULGERATION;  Surgeon: Malen Gauze, MD;  [facility];  Service: Urology;  Laterality: N/A;   CYSTOSCOPY WITH HOLMIUM LASER LITHOTRIPSY Right 04/08/2016   Procedure: CYSTOSCOPY WITH RIGHT RENAL STONE EXTRACTION WITH HOLMIUM LASER LITHOTRIPSY;  Surgeon: Malen Gauze, MD;  [facility];  Service: Urology;  Laterality: Right;   CYSTOSCOPY WITH HOLMIUM LASER LITHOTRIPSY Left 04/15/2016   Procedure: CYSTOSCOPY WITH HOLMIUM LASER LITHOTRIPSY;  Surgeon: Malen Gauze, MD;  [facility];  Service: Urology;  Laterality: Left;   CYSTOSCOPY WITH RETROGRADE PYELOGRAM, URETEROSCOPY AND STENT PLACEMENT Left 07/05/2017    Procedure: CYSTOSCOPY WITH RETROGRADE PYELOGRAM, URETEROSCOPY AND STENT PLACEMENT;  Surgeon: Malen Gauze, MD;  [facility];  Service: Urology;  Laterality: Left;   DIAGNOSTIC LAPAROSCOPY     DILATION AND CURETTAGE OF UTERUS     DILITATION & CURRETTAGE/HYSTROSCOPY WITH THERMACHOICE ABLATION N/A 09/07/2012   Procedure: DILATATION & CURETTAGE/HYSTEROSCOPY WITH THERMACHOICE ABLATION;  Surgeon: Lazaro Arms, MD;  [facility];  Service: Gynecology;  Laterality: N/A;   ESOPHAGOGASTRODUODENOSCOPY  10/14/10   ESOPHAGOGASTRODUODENOSCOPY N/A 01/04/2019   Procedure: ESOPHAGOGASTRODUODENOSCOPY (EGD);  Surgeon: Malissa Hippo, MD;  [facility];  Service: Endoscopy;  Laterality: N/A;  9:30   EXTRACORPOREAL SHOCK WAVE LITHOTRIPSY  multiple   HOLMIUM LASER APPLICATION Left 07/05/2017   Procedure: HOLMIUM LASER APPLICATION;  Surgeon: Malen Gauze, MD;  [facility];  Service: Urology;  Laterality: Left;   PERCUTANEOUS NEPHROSTOLITHOTOMY  2003   PLANTAR FASCIA SURGERY Bilateral right 2008//  left 2010   POLYPECTOMY  01/04/2019   Procedure: POLYPECTOMY;  Surgeon: Malissa Hippo, MD;  [facility];  Service:  Endoscopy;;  colon   STONE EXTRACTION WITH BASKET Left 04/15/2016   Procedure: STONE EXTRACTION WITH BASKET;  Surgeon: Malen Gauze, MD;  [facility];  Service: Urology;  Laterality: Left;    Family History  Problem Relation Age of Onset   Irritable bowel syndrome Mother    Scleroderma Mother    Rheum arthritis Mother    Asthma Mother    Melanoma Father    Cancer Father        Melanoma   Colon cancer Cousin 43       second cousin Nettie Elm Lovelace)   Cancer Maternal Grandmother        Ovarian   Diabetes Maternal Grandmother    Thyroid disease Maternal Aunt     Social History   Tobacco Use   Smoking status: Never   Smokeless tobacco: Never  Vaping Use   Vaping Use: Never used  Substance Use Topics   Alcohol use: No   Drug use: No     ROS   Objective:   Vitals: BP 128/71 (BP Location: Right Arm)    Pulse 92    Temp 98.6 F (37 C) (Oral)    Resp 18    LMP  (Approximate)    SpO2 98%   Physical Exam Constitutional:      General: She is not in acute distress.    Appearance: Normal appearance. She is well-developed and normal weight. She is not ill-appearing, toxic-appearing or diaphoretic.  HENT:     Head: Normocephalic and atraumatic.     Right Ear: Tympanic membrane, ear canal and external ear normal. No drainage or tenderness. No middle ear effusion. There is no impacted cerumen. Tympanic membrane is not erythematous.     Left Ear: Tympanic membrane, ear canal and external ear normal. No drainage or tenderness.  No middle ear effusion. There is no impacted cerumen. Tympanic membrane is not erythematous.     Nose: Congestion and rhinorrhea present.     Mouth/Throat:     Mouth: Mucous membranes are moist. No oral lesions.     Pharynx: No pharyngeal swelling, oropharyngeal exudate, posterior oropharyngeal erythema or uvula swelling.     Tonsils: No tonsillar exudate or tonsillar abscesses.     Comments: 2 tonsilliths over right side. Eyes:     General: No scleral icterus.       Right eye: No discharge.        Left eye: No discharge.     Extraocular Movements: Extraocular movements intact.     Right eye: Normal extraocular motion.     Left eye: Normal extraocular motion.     Conjunctiva/sclera: Conjunctivae normal.  Cardiovascular:     Rate and Rhythm: Normal rate.  Pulmonary:     Effort: Pulmonary effort is normal.  Musculoskeletal:     Cervical back: Normal range of motion and neck supple.  Lymphadenopathy:     Cervical: No cervical adenopathy.  Skin:    General: Skin is warm and dry.  Neurological:     General: No focal deficit present.     Mental Status: She is alert and oriented to person, place, and time.  Psychiatric:        Mood and Affect: Mood normal.        Behavior: Behavior normal.     Results for orders placed or performed during the hospital encounter of 08/20/21 (from the past 24 hour(s))  POCT rapid strep A     Status: None   Collection Time: 08/20/21  1:43 PM  Result Value Ref Range   Rapid Strep A Screen Negative Negative    Assessment and Plan :   PDMP not reviewed this encounter.  1. Viral upper respiratory infection   2. Throat pain   3. Stuffy and runny nose   4. Psoriatic arthritis (HCC)   5. Subacute cough    Deferred imaging given clear cardiopulmonary exam, hemodynamically stable vital signs.  Strep, COVID and flu tests are pending.  Recommended supportive care for a viral syndrome, viral upper respiratory infection.  Deferred use of prednisone even though patient would likely benefit from this due to her history of difficult allergies.  Unfortunately she cannot tolerate it well. Counseled patient on potential for adverse effects with medications prescribed/recommended today, ER and return-to-clinic precautions discussed, patient verbalized understanding.    Wallis Bamberg, [place] 08/20/21 1354

## 2021-08-20 NOTE — Discharge Instructions (Addendum)

## 2021-08-20 NOTE — ED Triage Notes (Signed)
Pt reports, headache, body aches, fever, sore throat white patches in throat x 3 days. Tylenol gives some relief.  ? ?Pt requested COVID test.  ?

## 2021-08-21 LAB — COVID-19, FLU A+B NAA
Influenza A, NAA: NOT DETECTED
Influenza B, NAA: NOT DETECTED
SARS-CoV-2, NAA: NOT DETECTED

## 2021-08-23 LAB — CULTURE, GROUP A STREP (THRC)

## 2021-08-25 ENCOUNTER — Ambulatory Visit
Admission: RE | Admit: 2021-08-25 | Discharge: 2021-08-25 | Disposition: A | Discharge status: Home / Self Care | Payer: Medicare HMO | Source: Ambulatory Visit | Attending: Urgent Care | Admitting: Urgent Care

## 2021-08-25 ENCOUNTER — Ambulatory Visit: Payer: Self-pay

## 2021-08-25 ENCOUNTER — Other Ambulatory Visit: Payer: Self-pay

## 2021-08-25 VITALS — BP 145/74 | HR 85 | Temp 98.5°F | Resp 18

## 2021-08-25 DIAGNOSIS — H6592 Unspecified nonsuppurative otitis media, left ear: Secondary | ICD-10-CM

## 2021-08-25 DIAGNOSIS — H9202 Otalgia, left ear: Secondary | ICD-10-CM

## 2021-08-25 DIAGNOSIS — J309 Allergic rhinitis, unspecified: Secondary | ICD-10-CM | POA: Diagnosis not present

## 2021-08-25 MED ORDER — FLUTICASONE PROPIONATE 50 MCG/ACT NA SUSP: 2.0000 | Freq: Every day | NASAL | 0 refills | Status: DC

## 2021-08-25 MED ORDER — CEFDINIR 300 MG PO CAPS: 300.0000 mg | ORAL_CAPSULE | Freq: Two times a day (BID) | ORAL | 0 refills | Status: DC

## 2021-08-25 NOTE — ED Provider Notes (Signed)
[facility]   MRN: [id number] DOB: 04-Sep-1970  Subjective:   Bethany King is a 50 y.o. female presenting for 2 day history of acute onset moderate to severe left ear pain, decreased hearing, dizziness.  Has been using Mucinex without any relief.  She did start taking Xyzal.  She is unable to tolerate pseudoephedrine.  She was seen last week 08/20/2021, had a negative flu test, COVID test, strep test and strep culture.  No current facility-administered medications for this encounter.  Current Outpatient Medications:    ALPRAZolam (XANAX) 0.5 MG tablet, Take 0.5 mg by mouth 3 (three) times daily as needed for anxiety. , Disp: , Rfl:    amoxicillin-clavulanate (AUGMENTIN) 875-125 MG tablet, Take 1 tablet by mouth every 12 (twelve) hours., Disp: 14 tablet, Rfl: 0   benzonatate (TESSALON) 100 MG capsule, Take 1-2 capsules (100-200 mg total) by mouth 3 (three) times daily as needed for cough., Disp: 60 capsule, Rfl: 0   conjugated estrogens (PREMARIN) vaginal cream, Use 0.5 gm daily for 2 weeks then 2-3 x a week, Disp: 42.5 g, Rfl: 3   DULoxetine (CYMBALTA) 60 MG capsule, Take 60 mg by mouth daily., Disp: , Rfl:    estradiol (ESTRACE) 1 MG tablet, TAKE 1 TABLET(1 MG) BY MOUTH DAILY, Disp: 30 tablet, Rfl: 12   fluticasone (FLONASE) 50 MCG/ACT nasal spray, Place into both nostrils., Disp: , Rfl:    HYDROcodone-acetaminophen (NORCO) 10-325 MG tablet, Take 1 tablet by mouth 4 (four) times daily as needed for moderate pain., Disp: 30 tablet, Rfl: 0   hydrOXYzine (ATARAX/VISTARIL) 10 MG tablet, Take 1 tablet (10 mg total) by mouth at bedtime., Disp: 30 tablet, Rfl: 11   levocetirizine (XYZAL) 5 MG tablet, Take 1 tablet (5 mg total) by mouth every evening., Disp: 90 tablet, Rfl: 0   nitrofurantoin (MACRODANTIN) 50 MG capsule, Take 1 capsule (50 mg total) by mouth at bedtime. (Patient not taking: Reported on 05/02/2021), Disp: 30 capsule, Rfl: 11   pentosan polysulfate (ELMIRON) 100 MG  capsule, TAKE 1 CAPSULE(100 MG) BY MOUTH THREE TIMES DAILY, Disp: 90 capsule, Rfl: 3   potassium chloride SA (K-DUR,KLOR-CON) 20 MEQ tablet, Take 40 mEq by mouth 2 (two) times daily. , Disp: , Rfl:    progesterone (PROMETRIUM) 200 MG capsule, TAKE 1 CAPSULE(200 MG) BY MOUTH AT BEDTIME, Disp: 30 capsule, Rfl: 12   promethazine-dextromethorphan (PROMETHAZINE-DM) 6.25-15 MG/5ML syrup, Take 5 mLs by mouth at bedtime as needed for cough., Disp: 100 mL, Rfl: 0   pseudoephedrine (SUDAFED) 60 MG tablet, Take 1 tablet (60 mg total) by mouth every 8 (eight) hours as needed for congestion., Disp: 30 tablet, Rfl: 0   TREMFYA 100 MG/ML SOSY, Inject into the skin., Disp: , Rfl:    triamterene-hydrochlorothiazide (DYAZIDE) 37.5-25 MG capsule, Take 1 capsule by mouth daily. , Disp: , Rfl: 11   Allergies  Allergen Reactions   Hydromorphone Itching   Prednisone Other (See Comments)    Increase anxiety symptoms, insomnia   Tramadol Other (See Comments)    Causes dizziness Other reaction(s): Severe Dizziness   Sulfa Antibiotics Rash    Past Medical History:  Diagnosis Date   Allergic rhinitis    Anxiety    Arthritis    Bladder pain    Complication of anesthesia    Costochondritis    hx   DDD (degenerative disc disease), lumbar    Depression    GERD (gastroesophageal reflux disease)    Hepatic hemangioma 2006   stable  History of adenomatous polyp of colon    History of colitis    History of gastritis    History of kidney stones    HTN (hypertension)    Irritable bowel syndrome (IBS)    Medullary sponge kidney    left   Migraine    Nephrolithiasis    bilateral-- nonobstructive per CT   PONV (postoperative nausea and vomiting)    Psoriatic arthritis (HCC)    Psoriatic arthritis (HCC)    Spinal stenosis    Urgency of urination      Past Surgical History:  Procedure Laterality Date   ANTERIOR CERVICAL DECOMP/DISCECTOMY FUSION  02/26/2014   C5 - C6; fusion with plate   BIOPSY   01/04/2019   Procedure: BIOPSY;  Surgeon: Malissa Hippo, MD;  Location: [facility];  Service: Endoscopy;;  gastric polyps   CARPAL TUNNEL RELEASE Bilateral right 04-01-2010/  left 05-30-2010   CHOLECYSTECTOMY N/A 07/14/2013   Procedure: LAPAROSCOPIC CHOLECYSTECTOMY;  Surgeon: Dalia Heading, MD;  Location: [facility];  Service: General;  Laterality: N/A;   COLONOSCOPY  10-13-2010   COLONOSCOPY N/A 01/04/2019   Procedure: COLONOSCOPY;  Surgeon: Malissa Hippo, MD;  [facility]: [facility];  Service: Endoscopy;  Laterality: N/A;   CYSTO WITH HYDRODISTENSION N/A 04/02/2015   Procedure: CYSTOSCOPY/HYDRODISTENSION, BLADDER BIOPSY WITH FULGURATION;  Surgeon: Bjorn Pippin, MD;  [facility];  Service: Urology;  Laterality: N/A;   CYSTO WITH HYDRODISTENSION N/A 03/25/2018   Procedure: CYSTOSCOPY/HYDRODISTENSION, INSTILL MARCAIN AND PYRIDIUM;  Surgeon: Bjorn Pippin, MD;  Location: [facility];  Service: Urology;  Laterality: N/A;   CYSTO WITH HYDRODISTENSION N/A 01/15/2020   Procedure: CYSTOSCOPY/HYDRODISTENSION;  Surgeon: Malen Gauze, MD;  Location: [facility];  Service: Urology;  Laterality: N/A;   CYSTOSCOPY W/ URETERAL STENT PLACEMENT Bilateral 04/08/2016   Procedure: CYSTOSCOPY WITH BILATERAL RETROGRADE PYELOGRAM, BILATERAL URETERAL STENT PLACEMENT;  Surgeon: Malen Gauze, MD;  Location: [facility];  Service: Urology;  Laterality: Bilateral;   CYSTOSCOPY W/ URETERAL STENT PLACEMENT Left 04/15/2016   Procedure: CYSTOSCOPY WITH RETROGRADE PYELOGRAM/URETERAL STENT PLACEMENT;  Surgeon: Malen Gauze, MD;  Location: [facility];  Service: Urology;  Laterality: Left;   CYSTOSCOPY WITH BIOPSY N/A 01/15/2020   Procedure: CYSTOSCOPY WITH BLADDER BIOPSY AND FULGERATION;  Surgeon: Malen Gauze, MD;  Location: [facility];  Service: Urology;  Laterality: N/A;   CYSTOSCOPY WITH HOLMIUM LASER LITHOTRIPSY Right 04/08/2016   Procedure: CYSTOSCOPY WITH RIGHT RENAL STONE EXTRACTION WITH HOLMIUM  LASER LITHOTRIPSY;  Surgeon: Malen Gauze, MD;  Location: [facility];  Service: Urology;  Laterality: Right;   CYSTOSCOPY WITH HOLMIUM LASER LITHOTRIPSY Left 04/15/2016   Procedure: CYSTOSCOPY WITH HOLMIUM LASER LITHOTRIPSY;  Surgeon: Malen Gauze, MD;  Location: [facility];  Service: Urology;  Laterality: Left;   CYSTOSCOPY WITH RETROGRADE PYELOGRAM, URETEROSCOPY AND STENT PLACEMENT Left 07/05/2017   Procedure: CYSTOSCOPY WITH RETROGRADE PYELOGRAM, URETEROSCOPY AND STENT PLACEMENT;  Surgeon: Malen Gauze, MD;  Location: [facility];  Service: Urology;  Laterality: Left;   DIAGNOSTIC LAPAROSCOPY     DILATION AND CURETTAGE OF UTERUS     DILITATION & CURRETTAGE/HYSTROSCOPY WITH THERMACHOICE ABLATION N/A 09/07/2012   Procedure: DILATATION & CURETTAGE/HYSTEROSCOPY WITH THERMACHOICE ABLATION;  Surgeon: Lazaro Arms, MD;  Location: [facility];  Service: Gynecology;  Laterality: N/A;   ESOPHAGOGASTRODUODENOSCOPY  10/14/10   ESOPHAGOGASTRODUODENOSCOPY N/A 01/04/2019   Procedure: ESOPHAGOGASTRODUODENOSCOPY (EGD);  Surgeon: Malissa Hippo, MD;  [facility];  Service: Endoscopy;  Laterality: N/A;  9:30   EXTRACORPOREAL SHOCK WAVE LITHOTRIPSY  multiple   HOLMIUM LASER APPLICATION Left 07/05/2017   Procedure: HOLMIUM LASER APPLICATION;  Surgeon: Malen Gauze, MD;  Location: [facility];  Service: Urology;  Laterality: Left;   PERCUTANEOUS NEPHROSTOLITHOTOMY  2003   PLANTAR FASCIA SURGERY Bilateral right 2008//  left 2010   POLYPECTOMY  01/04/2019   Procedure: POLYPECTOMY;  Surgeon: Malissa Hippo, MD;  [facility];  Service: Endoscopy;;  colon   STONE EXTRACTION WITH BASKET Left 04/15/2016   Procedure: STONE EXTRACTION WITH BASKET;  Surgeon: Malen Gauze, MD;  Location: [facility];  Service: Urology;  Laterality: Left;    Family History  Problem Relation Age of Onset   Irritable bowel syndrome Mother    Scleroderma Mother    Rheum arthritis Mother    Asthma Mother     Melanoma Father    Cancer Father        Melanoma   Colon cancer Cousin 77       second cousin Nettie Elm Lovelace)   Cancer Maternal Grandmother        Ovarian   Diabetes Maternal Grandmother    Thyroid disease Maternal Aunt     Social History   Tobacco Use   Smoking status: Never   Smokeless tobacco: Never  Vaping Use   Vaping Use: Never used  Substance Use Topics   Alcohol use: No   Drug use: No    ROS   Objective:   Vitals: BP (!) 145/74 (BP Location: Right Arm)    Pulse 85    Temp 98.5 F (36.9 C) (Oral)    Resp 18    SpO2 97%   Physical Exam Constitutional:      General: She is not in acute distress.    Appearance: Normal appearance. She is well-developed. She is not ill-appearing, toxic-appearing or diaphoretic.  HENT:     Head: Normocephalic and atraumatic.     Right Ear: Tympanic membrane, ear canal and external ear normal. There is no impacted cerumen. Tympanic membrane is not injected, perforated, erythematous or bulging.     Left Ear: Ear canal and external ear normal. There is no impacted cerumen. Tympanic membrane is erythematous and bulging. Tympanic membrane is not injected or perforated.     Nose: Nose normal.     Mouth/Throat:     Mouth: Mucous membranes are moist.     Pharynx: No pharyngeal swelling, oropharyngeal exudate, posterior oropharyngeal erythema or uvula swelling.     Tonsils: No tonsillar exudate or tonsillar abscesses. 0 on the right. 0 on the left.  Eyes:     General: No scleral icterus.       Right eye: No discharge.        Left eye: No discharge.     Extraocular Movements: Extraocular movements intact.     Conjunctiva/sclera: Conjunctivae normal.  Cardiovascular:     Rate and Rhythm: Normal rate.  Pulmonary:     Effort: Pulmonary effort is normal.  Skin:    General: Skin is warm and dry.  Neurological:     General: No focal deficit present.     Mental Status: She is alert and oriented to person, place, and time.  Psychiatric:         Mood and Affect: Mood normal.        Behavior: Behavior normal.        Thought Content: Thought content normal.        Judgment: Judgment normal.  Assessment and Plan :   PDMP not reviewed this encounter.  1. Left non-suppurative otitis media   2. Left ear pain   3. Allergic rhinitis, unspecified seasonality, unspecified trigger    Start cefdinir to cover for otitis media. Use supportive care otherwise.  Recommended the continued use of Xyzal.  Also would like her to add Flonase.  Counseled patient on potential for adverse effects with medications prescribed/recommended today, ER and return-to-clinic precautions discussed, patient verbalized understanding.     Wallis Bamberg, PA-C 08/25/21 1430

## 2021-08-25 NOTE — ED Triage Notes (Signed)
Pt reports left ear pain, decreased hearing decrease x 3 days; feeling dizziness when moving around x 2 days. Mucinex gives no relief.  ? ?Pt reports she can not take Sudafed.  ?

## 2021-09-02 DIAGNOSIS — E6609 Other obesity due to excess calories: Secondary | ICD-10-CM | POA: Diagnosis not present

## 2021-09-02 DIAGNOSIS — H9202 Otalgia, left ear: Secondary | ICD-10-CM | POA: Diagnosis not present

## 2021-09-02 DIAGNOSIS — Z6832 Body mass index (BMI) 32.0-32.9, adult: Secondary | ICD-10-CM | POA: Diagnosis not present

## 2021-09-10 ENCOUNTER — Ambulatory Visit: Payer: Medicare HMO | Admitting: Urology

## 2021-09-16 DIAGNOSIS — N3010 Interstitial cystitis (chronic) without hematuria: Secondary | ICD-10-CM | POA: Diagnosis not present

## 2021-09-16 DIAGNOSIS — M25579 Pain in unspecified ankle and joints of unspecified foot: Secondary | ICD-10-CM | POA: Diagnosis not present

## 2021-09-16 DIAGNOSIS — L4050 Arthropathic psoriasis, unspecified: Secondary | ICD-10-CM | POA: Diagnosis not present

## 2021-09-16 DIAGNOSIS — Z79899 Other long term (current) drug therapy: Secondary | ICD-10-CM | POA: Diagnosis not present

## 2021-09-16 DIAGNOSIS — L409 Psoriasis, unspecified: Secondary | ICD-10-CM | POA: Diagnosis not present

## 2021-09-16 DIAGNOSIS — M2550 Pain in unspecified joint: Secondary | ICD-10-CM | POA: Diagnosis not present

## 2021-09-16 DIAGNOSIS — M79643 Pain in unspecified hand: Secondary | ICD-10-CM | POA: Diagnosis not present

## 2021-09-16 DIAGNOSIS — M1990 Unspecified osteoarthritis, unspecified site: Secondary | ICD-10-CM | POA: Diagnosis not present

## 2021-09-29 DIAGNOSIS — L298 Other pruritus: Secondary | ICD-10-CM | POA: Diagnosis not present

## 2021-09-29 DIAGNOSIS — D225 Melanocytic nevi of trunk: Secondary | ICD-10-CM | POA: Diagnosis not present

## 2021-09-29 DIAGNOSIS — Z1283 Encounter for screening for malignant neoplasm of skin: Secondary | ICD-10-CM | POA: Diagnosis not present

## 2021-09-29 DIAGNOSIS — D485 Neoplasm of uncertain behavior of skin: Secondary | ICD-10-CM | POA: Diagnosis not present

## 2021-10-01 ENCOUNTER — Ambulatory Visit (HOSPITAL_COMMUNITY)
Admission: RE | Admit: 2021-10-01 | Discharge: 2021-10-01 | Disposition: A | Discharge status: Home / Self Care | Payer: Medicare HMO | Source: Ambulatory Visit | Attending: Urology | Admitting: Urology

## 2021-10-01 ENCOUNTER — Ambulatory Visit: Payer: Medicare HMO | Admitting: Urology

## 2021-10-01 ENCOUNTER — Encounter: Payer: Self-pay | Admitting: Urology

## 2021-10-01 VITALS — BP 126/71 | HR 93

## 2021-10-01 DIAGNOSIS — N3010 Interstitial cystitis (chronic) without hematuria: Secondary | ICD-10-CM | POA: Diagnosis not present

## 2021-10-01 DIAGNOSIS — N200 Calculus of kidney: Secondary | ICD-10-CM

## 2021-10-01 DIAGNOSIS — N3281 Overactive bladder: Secondary | ICD-10-CM

## 2021-10-01 LAB — URINALYSIS, ROUTINE W REFLEX MICROSCOPIC
Bilirubin, UA: NEGATIVE
Glucose, UA: NEGATIVE
Nitrite, UA: NEGATIVE
Specific Gravity, UA: >1.03 — ABNORMAL HIGH (ref 1.005–1.030)
Urobilinogen, Ur: 0.2 mg/dL (ref 0.2–1.0)
pH, UA: 6 (ref 5.0–7.5)

## 2021-10-01 LAB — MICROSCOPIC EXAMINATION: Renal Epithel, UA: NONE SEEN /hpf

## 2021-10-01 MED ORDER — NITROFURANTOIN MACROCRYSTAL 50 MG PO CAPS: 50.0000 mg | ORAL_CAPSULE | Freq: Every day | ORAL | 11 refills | Status: DC

## 2021-10-01 MED ORDER — ELMIRON 100 MG PO CAPS: ORAL_CAPSULE | ORAL | 3 refills | Status: DC

## 2021-10-01 MED ORDER — HYDROXYZINE HCL 10 MG PO TABS: 10.0000 mg | ORAL_TABLET | Freq: Every day | ORAL | 11 refills | Status: DC

## 2021-10-01 NOTE — Patient Instructions (Signed)
Dietary Guidelines to Help Prevent Kidney Stones Kidney stones are deposits of minerals and salts that form inside your kidneys. Your risk of developing kidney stones may be greater depending on your diet, your lifestyle, the medicines you take, and whether you have certain medical conditions. Most people can lower their chances of developing kidney stones by following the instructions below. Your dietitian may give you more specific instructions depending on your overall health and the type of kidney stones you tend to develop. What are tips for following this plan? Reading food labels  Choose foods with "no salt added" or "low-salt" labels. Limit your salt (sodium) intake to less than 1,500 mg a day. Choose foods with calcium for each meal and snack. Try to eat about 300 mg of calcium at each meal. Foods that contain 200-500 mg of calcium a serving include: 8 oz (237 mL) of milk, calcium-fortifiednon-dairy milk, and calcium-fortifiedfruit juice. Calcium-fortified means that calcium has been added to these drinks. 8 oz (237 mL) of kefir, yogurt, and soy yogurt. 4 oz (114 g) of tofu. 1 oz (28 g) of cheese. 1 cup (150 g) of dried figs. 1 cup (91 g) of cooked broccoli. One 3 oz (85 g) can of sardines or mackerel. Most people need 1,000-1,500 mg of calcium a day. Talk to your dietitian about how much calcium is recommended for you. Shopping Buy plenty of fresh fruits and vegetables. Most people do not need to avoid fruits and vegetables, even if these foods contain nutrients that may contribute to kidney stones. When shopping for convenience foods, choose: Whole pieces of fruit. Pre-made salads with dressing on the side. Low-fat fruit and yogurt smoothies. Avoid buying frozen meals or prepared deli foods. These can be high in sodium. Look for foods with live cultures, such as yogurt and kefir. Choose high-fiber grains, such as whole-wheat breads, oat bran, and wheat cereals. Cooking Do not add  salt to food when cooking. Place a salt shaker on the table and allow each person to add his or her own salt to taste. Use vegetable protein, such as beans, textured vegetable protein (TVP), or tofu, instead of meat in pasta, casseroles, and soups. Meal planning Eat less salt, if told by your dietitian. To do this: Avoid eating processed or pre-made food. Avoid eating fast food. Eat less animal protein, including cheese, meat, poultry, or fish, if told by your dietitian. To do this: Limit the number of times you have meat, poultry, fish, or cheese each week. Eat a diet free of meat at least 2 days a week. Eat only one serving each day of meat, poultry, fish, or seafood. When you prepare animal protein, cut pieces into small portion sizes. For most meat and fish, one serving is about the size of the palm of your hand. Eat at least five servings of fresh fruits and vegetables each day. To do this: Keep fruits and vegetables on hand for snacks. Eat one piece of fruit or a handful of berries with breakfast. Have a salad and fruit at lunch. Have two kinds of vegetables at dinner. Limit foods that are high in a substance called oxalate. These include: Spinach (cooked), rhubarb, beets, sweet potatoes, and Swiss chard. Peanuts. Potato chips, french fries, and baked potatoes with skin on. Nuts and nut products. Chocolate. If you regularly take a diuretic medicine, make sure to eat at least 1 or 2 servings of fruits or vegetables that are high in potassium each day. These include: Avocado. Banana. Orange, prune,   carrot, or tomato juice. Baked potato. Cabbage. Beans and split peas. Lifestyle  Drink enough fluid to keep your urine pale yellow. This is the most important thing you can do. Spread your fluid intake throughout the day. If you drink alcohol: Limit how much you use to: 0-1 drink a day for women who are not pregnant. 0-2 drinks a day for men. Be aware of how much alcohol is in your  drink. In the [place] one drink equals one 12 oz bottle of beer (355 mL), one 5 oz glass of wine (148 mL), or one 1 oz glass of hard liquor (44 mL). Lose weight if told by your health care provider. Work with your dietitian to find an eating plan and weight loss strategies that work best for you. General information Talk to your health care provider and dietitian about taking daily supplements. You may be told the following depending on your health and the cause of your kidney stones: Not to take supplements with vitamin C. To take a calcium supplement. To take a daily probiotic supplement. To take other supplements such as magnesium, fish oil, or vitamin B6. Take over-the-counter and prescription medicines only as told by your health care provider. These include supplements. What foods should I limit? Limit your intake of the following foods, or eat them as told by your dietitian. Vegetables Spinach. Rhubarb. Beets. Canned vegetables. Pickles. Olives. Baked potatoes with skin. Grains Wheat bran. Baked goods. Salted crackers. Cereals high in sugar. Meats and other proteins Nuts. Nut butters. Large portions of meat, poultry, or fish. Salted, precooked, or cured meats, such as sausages, meat loaves, and hot dogs. Dairy Cheese. Beverages Regular soft drinks. Regular vegetable juice. Seasonings and condiments Seasoning blends with salt. Salad dressings. Soy sauce. Ketchup. Barbecue sauce. Other foods Canned soups. Canned pasta sauce. Casseroles. Pizza. Lasagna. Frozen meals. Potato chips. French fries. The items listed above may not be a complete list of foods and beverages you should limit. Contact a dietitian for more information. What foods should I avoid? Talk to your dietitian about specific foods you should avoid based on the type of kidney stones you have and your overall health. Fruits Grapefruit. The item listed above may not be a complete list of foods and beverages you should  avoid. Contact a dietitian for more information. Summary Kidney stones are deposits of minerals and salts that form inside your kidneys. You can lower your risk of kidney stones by making changes to your diet. The most important thing you can do is drink enough fluid. Drink enough fluid to keep your urine pale yellow. Talk to your dietitian about how much calcium you should have each day, and eat less salt and animal protein as told by your dietitian. This information is not intended to replace advice given to you by your health care provider. Make sure you discuss any questions you have with your health care provider. Document Revised: 02/10/2021 Document Reviewed: 02/10/2021 Elsevier Patient Education  2023 [employer]  

## 2021-10-01 NOTE — Progress Notes (Signed)
? ?10/01/2021 ?3:51 PM  ? ?Marcine A Belleville ?05/15/1971 ?[id number] ? ?Referring provider: Elfredia Nevins, MD ?[address] ?[place],  [place] [address] ? ?Followup nephrolithiasis, IC and recurrent UTI ? ? ?HPI: ?[name] is a 51yo here for followup for  nephrolithiasis, IC and recurrent UTI. No UTIs since last visit. She remains on macrobid 50mg  QHS. No stone events since last visit. KUB from today shows bilateral stable renal calculi. She denies flank pain. Her IC has been well under control with elmiron and hydroxyzine. No other complaints today ? ? ?PMH: ?Past Medical History:  ?Diagnosis Date  ? Allergic rhinitis   ? Anxiety   ? Arthritis   ? Bladder pain   ? Complication of anesthesia   ? Costochondritis   ? hx  ? DDD (degenerative disc disease), lumbar   ? Depression   ? GERD (gastroesophageal reflux disease)   ? Hepatic hemangioma 2006  ? stable  ? History of adenomatous polyp of colon   ? History of colitis   ? History of gastritis   ? History of kidney stones   ? HTN (hypertension)   ? Irritable bowel syndrome (IBS)   ? Medullary sponge kidney   ? left  ? Migraine   ? Nephrolithiasis   ? bilateral-- nonobstructive per CT  ? PONV (postoperative nausea and vomiting)   ? Psoriatic arthritis (HCC)   ? Psoriatic arthritis (HCC)   ? Spinal stenosis   ? Urgency of urination   ? ? ?Surgical History: ?Past Surgical History:  ?Procedure Laterality Date  ? ANTERIOR CERVICAL DECOMP/DISCECTOMY FUSION  02/26/2014  ? C5 - C6; fusion with plate  ? BIOPSY  01/04/2019  ? Procedure: BIOPSY;  Surgeon: Malissa Hippo, MD;  [facility];  Service: Endoscopy;;  gastric polyps  ? CARPAL TUNNEL RELEASE Bilateral right 04-01-2010/  left 05-30-2010  ? CHOLECYSTECTOMY N/A 07/14/2013  ? Procedure: LAPAROSCOPIC CHOLECYSTECTOMY;  Surgeon: Dalia Heading, MD;  [facility]: [facility];  Service: General;  Laterality: N/A;  ? COLONOSCOPY  10-13-2010  ? COLONOSCOPY N/A 01/04/2019  ? Procedure: COLONOSCOPY;  Surgeon: Malissa Hippo, MD;  [facility];  Service: Endoscopy;  Laterality: N/A;  ? CYSTO WITH HYDRODISTENSION N/A 04/02/2015  ? Procedure: CYSTOSCOPY/HYDRODISTENSION, BLADDER BIOPSY WITH FULGURATION;  Surgeon: Bjorn Pippin, MD;  Location: [facility];  Service: Urology;  Laterality: N/A;  ? CYSTO WITH HYDRODISTENSION N/A 03/25/2018  ? Procedure: CYSTOSCOPY/HYDRODISTENSION, INSTILL MARCAIN AND PYRIDIUM;  Surgeon: Bjorn Pippin, MD;  [facility];  Service: Urology;  Laterality: N/A;  ? CYSTO WITH HYDRODISTENSION N/A 01/15/2020  ? Procedure: CYSTOSCOPY/HYDRODISTENSION;  Surgeon: Malen Gauze, MD;  [facility]: [facility];  Service: Urology;  Laterality: N/A;  ? CYSTOSCOPY W/ URETERAL STENT PLACEMENT Bilateral 04/08/2016  ? Procedure: CYSTOSCOPY WITH BILATERAL RETROGRADE PYELOGRAM, BILATERAL URETERAL STENT PLACEMENT;  Surgeon: Malen Gauze, MD;  [facility];  Service: Urology;  Laterality: Bilateral;  ? CYSTOSCOPY W/ URETERAL STENT PLACEMENT Left 04/15/2016  ? Procedure: CYSTOSCOPY WITH RETROGRADE PYELOGRAM/URETERAL STENT PLACEMENT;  Surgeon: Malen Gauze, MD;  [facility];  Service: Urology;  Laterality: Left;  ? CYSTOSCOPY WITH BIOPSY N/A 01/15/2020  ? Procedure: CYSTOSCOPY WITH BLADDER BIOPSY AND FULGERATION;  Surgeon: Malen Gauze, MD;  [facility];  Service: Urology;  Laterality: N/A;  ? CYSTOSCOPY WITH HOLMIUM LASER LITHOTRIPSY Right 04/08/2016  ? Procedure: CYSTOSCOPY WITH RIGHT RENAL STONE EXTRACTION WITH HOLMIUM LASER LITHOTRIPSY;  Surgeon: Malen Gauze, MD;  [facility];  Service: Urology;  Laterality: Right;  ? CYSTOSCOPY WITH HOLMIUM LASER LITHOTRIPSY Left 04/15/2016  ? Procedure: CYSTOSCOPY WITH HOLMIUM LASER LITHOTRIPSY;  Surgeon: Malen Gauze, MD;  [facility];  Service: Urology;  Laterality: Left;  ? CYSTOSCOPY WITH RETROGRADE PYELOGRAM, URETEROSCOPY AND STENT PLACEMENT Left 07/05/2017  ? Procedure: CYSTOSCOPY WITH RETROGRADE PYELOGRAM,  URETEROSCOPY AND STENT PLACEMENT;  Surgeon: Malen Gauze, MD;  [facility];  Service: Urology;  Laterality: Left;  ? DIAGNOSTIC LAPAROSCOPY    ? DILATION AND CURETTAGE OF UTERUS    ? DILITATION & CURRETTAGE/HYSTROSCOPY WITH THERMACHOICE ABLATION N/A 09/07/2012  ? Procedure: DILATATION & CURETTAGE/HYSTEROSCOPY WITH THERMACHOICE ABLATION;  Surgeon: Lazaro Arms, MD;  [facility];  Service: Gynecology;  Laterality: N/A;  ? ESOPHAGOGASTRODUODENOSCOPY  10/14/10  ? ESOPHAGOGASTRODUODENOSCOPY N/A 01/04/2019  ? Procedure: ESOPHAGOGASTRODUODENOSCOPY (EGD);  Surgeon: Malissa Hippo, MD;  [facility];  Service: Endoscopy;  Laterality: N/A;  9:30  ? EXTRACORPOREAL SHOCK WAVE LITHOTRIPSY  multiple  ? HOLMIUM LASER APPLICATION Left 07/05/2017  ? Procedure: HOLMIUM LASER APPLICATION;  Surgeon: Malen Gauze, MD;  [facility];  Service: Urology;  Laterality: Left;  ? PERCUTANEOUS NEPHROSTOLITHOTOMY  2003  ? PLANTAR FASCIA SURGERY Bilateral right 2008//  left 2010  ? POLYPECTOMY  01/04/2019  ? Procedure: POLYPECTOMY;  Surgeon: Malissa Hippo, MD;  [facility];  Service: Endoscopy;;  colon  ? STONE EXTRACTION WITH BASKET Left 04/15/2016  ? Procedure: STONE EXTRACTION WITH BASKET;  Surgeon: Malen Gauze, MD;  [facility];  Service: Urology;  Laterality: Left;  ? ? ?Home Medications:  ?Allergies as of 10/01/2021   ? ?   Reactions  ? Hydromorphone Itching  ? Prednisone Other (See Comments)  ? Increase anxiety symptoms, insomnia  ? Tramadol Other (See Comments)  ? Causes dizziness ?Other reaction(s): Severe Dizziness  ? Sulfa Antibiotics Rash  ? ?  ? ?  ?Medication List  ?  ? ?  ? Accurate as of October 01, 2021  3:51 PM. If you have any questions, ask your nurse or doctor.  ?  ?  ? ?  ? ?ALPRAZolam 0.5 MG tablet ?Commonly known as: Prudy Feeler ?Take 0.5 mg by mouth 3 (three) times daily as needed for anxiety. ?  ?amoxicillin-clavulanate 875-125 MG tablet ?Commonly known as:  AUGMENTIN ?Take 1 tablet by mouth every 12 (twelve) hours. ?  ?benzonatate 100 MG capsule ?Commonly known as: TESSALON ?Take 1-2 capsules (100-200 mg total) by mouth 3 (three) times daily as needed for cough. ?  ?cefdinir 300 MG capsule ?Commonly known as: OMNICEF ?Take 1 capsule (300 mg total) by mouth 2 (two) times daily. ?  ?cyclobenzaprine 10 MG tablet ?Commonly known as: FLEXERIL ?Take 10 mg by mouth 3 (three) times daily as needed. ?  ?DULoxetine 60 MG capsule ?Commonly known as: CYMBALTA ?Take 60 mg by mouth daily. ?  ?Elmiron 100 MG capsule ?Generic drug: pentosan polysulfate ?TAKE 1 CAPSULE(100 MG) BY MOUTH THREE TIMES DAILY ?  ?estradiol 1 MG tablet ?Commonly known as: ESTRACE ?TAKE 1 TABLET(1 MG) BY MOUTH DAILY ?  ?ezetimibe 10 MG tablet ?Commonly known as: ZETIA ?Take 10 mg by mouth daily. ?  ?fluticasone 50 MCG/ACT nasal spray ?Commonly known as: FLONASE ?Place 2 sprays into both nostrils daily. ?  ?HYDROcodone-acetaminophen 10-325 MG tablet ?Commonly known as: NORCO ?Take 1 tablet by mouth 4 (four) times daily as needed for moderate pain. ?  ?hydrOXYzine 10 MG tablet ?Commonly known as: ATARAX ?  Take 1 tablet (10 mg total) by mouth at bedtime. ?  ?levocetirizine 5 MG tablet ?Commonly known as: XYZAL ?Take 1 tablet (5 mg total) by mouth every evening. ?  ?nitrofurantoin 50 MG capsule ?Commonly known as: MACRODANTIN ?Take 1 capsule (50 mg total) by mouth at bedtime. ?  ?potassium chloride SA 20 MEQ tablet ?Commonly known as: KLOR-CON M ?Take 40 mEq by mouth 2 (two) times daily. ?  ?Premarin vaginal cream ?Generic drug: conjugated estrogens ?Use 0.5 gm daily for 2 weeks then 2-3 x a week ?  ?progesterone 200 MG capsule ?Commonly known as: PROMETRIUM ?TAKE 1 CAPSULE(200 MG) BY MOUTH AT BEDTIME ?  ?promethazine-dextromethorphan 6.25-15 MG/5ML syrup ?Commonly known as: PROMETHAZINE-DM ?Take 5 mLs by mouth at bedtime as needed for cough. ?  ?pseudoephedrine 60 MG tablet ?Commonly known as: SUDAFED ?Take 1  tablet (60 mg total) by mouth every 8 (eight) hours as needed for congestion. ?  ?rosuvastatin 10 MG tablet ?Commonly known as: CRESTOR ?Take 10 mg by mouth daily. ?  ?Tremfya 100 MG/ML Sosy ?Generic drug: Guselkumab ?Inject i

## 2021-10-03 LAB — URINE CULTURE

## 2021-11-19 ENCOUNTER — Ambulatory Visit
Admission: EM | Admit: 2021-11-19 | Discharge: 2021-11-19 | Disposition: A | Discharge status: Home / Self Care | Payer: Medicare HMO | Attending: Nurse Practitioner | Admitting: Nurse Practitioner

## 2021-11-19 ENCOUNTER — Ambulatory Visit: Payer: Medicare HMO

## 2021-11-19 DIAGNOSIS — R300 Dysuria: Secondary | ICD-10-CM | POA: Insufficient documentation

## 2021-11-19 LAB — POCT URINALYSIS DIP (MANUAL ENTRY)
Bilirubin, UA: NEGATIVE
Glucose, UA: NEGATIVE mg/dL
Ketones, POC UA: NEGATIVE mg/dL
Nitrite, UA: NEGATIVE
Protein Ur, POC: NEGATIVE mg/dL
Spec Grav, UA: 1.02 (ref 1.010–1.025)
Urobilinogen, UA: 0.2 E.U./dL
pH, UA: 7.5 (ref 5.0–8.0)

## 2021-11-19 MED ORDER — CEPHALEXIN 500 MG PO CAPS: 500.0000 mg | ORAL_CAPSULE | Freq: Four times a day (QID) | ORAL | 0 refills | Status: AC

## 2021-11-19 NOTE — ED Triage Notes (Signed)
Pt states that about 8 days ago she started having symptoms of a UTI  Pt states she had some nitrofurantion left over at home and she used about 8 doses for the past 4 days  Pt states she has a lot of frequent urination, lots of burning and some pain in her abdomin

## 2021-11-19 NOTE — Discharge Instructions (Signed)
-   The urinalysis today shows a small amount of blood, large amount of white blood cells -We are sending the urine for culture; please start the antibiotic (Keflex) and take it 4 times daily for 5 days in the meantime -Please seek care if your symptoms worsen despite treatment especially if you develop fever, severe back pain, or nausea/vomiting and are unable to keep fluids down

## 2021-11-19 NOTE — ED Provider Notes (Signed)
[facility]    CSN: [id number] Arrival date & time: 11/19/21  1451      History   Chief Complaint Chief Complaint  Patient presents with   UTI symptoms    HPI Bethany King is a 51 y.o. female.   Patient presents with 8 days of UTI symptoms including dysuria, urinary urgency, urinary frequency, suprapubic pressure, odorous urine and mid low back pain.  She denies fevers, body aches, chills, nausea/vomiting.  Also denies vaginal discharge and abdominal pain.  Patient reports a medical history significant for interstitial cystitis and frequent UTI.  She has a prescription for Macrobid 50 mg that she is supposed to be taking nightly, however she stopped this medication because she was doing well.  Reports she has been taking Macrobid 100 mg twice daily for the past 4 days without any improvement in her symptoms.   Past Medical History:  Diagnosis Date   Allergic rhinitis    Anxiety    Arthritis    Bladder pain    Complication of anesthesia    Costochondritis    hx   DDD (degenerative disc disease), lumbar    Depression    GERD (gastroesophageal reflux disease)    Hepatic hemangioma 2006   stable   History of adenomatous polyp of colon    History of colitis    History of gastritis    History of kidney stones    HTN (hypertension)    Irritable bowel syndrome (IBS)    Medullary sponge kidney    left   Migraine    Nephrolithiasis    bilateral-- nonobstructive per CT   PONV (postoperative nausea and vomiting)    Psoriatic arthritis ([place])    Psoriatic arthritis ([place])    Spinal stenosis    Urgency of urination     Patient Active Problem List   Diagnosis Date Noted   Overactive bladder 09/09/2020   Encounter for screening fecal occult blood testing 04/09/2020   Encounter for well woman exam with routine gynecological exam 04/09/2020   Hormone replacement therapy (HRT) 04/09/2020   Pelvic pain in female 04/05/2020   Urinary urgency 04/05/2020    Vaginal dryness 02/27/2020   Burning with urination 02/27/2020   Vaginal burning 02/27/2020   Chronic interstitial cystitis 02/09/2020   Menopause 04/04/2019   Screening for colorectal cancer 04/04/2019   Encounter for gynecological examination with Papanicolaou smear of cervix 04/04/2019   Routine cervical smear 04/04/2019   Vaginal dryness, menopausal 04/04/2019   Hot flashes 04/04/2019   Psoriatic arthritis ([place]) 12/26/2018   History of colonic polyps 12/26/2018   Pain of upper abdomen 12/26/2018   Abdominal pain 06/23/2014   Acute gastroenteritis 06/23/2014   Back pain with radiation 10/24/2012   Breast mass 10/09/2012   Seasonal allergies 09/22/2012   Hypokalemia 09/22/2012   Dysmenorrhea 08/30/2012   Sciatica 05/29/2012   Sinusitis 03/02/2012   Back pain 02/10/2012   Menorrhagia 12/30/2011   Shoulder pain, left 12/03/2011   Costochondritis 11/17/2011   Shoulder bursitis 11/17/2011   Recurrent UTI 10/09/2011   Migraine headache 10/08/2011   Obesity 09/04/2011   HTN (hypertension) 07/14/2011   Anxiety 07/14/2011   Diarrhea 05/19/2011   Chest pain 04/30/2011   IBS (irritable bowel syndrome) 09/23/2010   Epigastric pain 09/23/2010   CONSTIPATION 01/29/2010   GERD 01/24/2010    Past Surgical History:  Procedure Laterality Date   ANTERIOR CERVICAL DECOMP/DISCECTOMY FUSION  02/26/2014   C5 - C6; fusion with plate   BIOPSY  01/04/2019   Procedure: BIOPSY;  Surgeon: Rogene Houston, MD;  [facility];  Service: Endoscopy;;  gastric polyps   CARPAL TUNNEL RELEASE Bilateral right 04-01-2010/  left 05-30-2010   CHOLECYSTECTOMY N/A 07/14/2013   Procedure: LAPAROSCOPIC CHOLECYSTECTOMY;  Surgeon: Jamesetta So, MD;  [facility];  Service: General;  Laterality: N/A;   COLONOSCOPY  10-13-2010   COLONOSCOPY N/A 01/04/2019   Procedure: COLONOSCOPY;  Surgeon: Rogene Houston, MD;  [facility];  Service: Endoscopy;  Laterality: N/A;   CYSTO WITH  HYDRODISTENSION N/A 04/02/2015   Procedure: CYSTOSCOPY/HYDRODISTENSION, BLADDER BIOPSY WITH FULGURATION;  Surgeon: Irine Seal, MD;  Location: [facility];  Service: Urology;  Laterality: N/A;   CYSTO WITH HYDRODISTENSION N/A 03/25/2018   Procedure: CYSTOSCOPY/HYDRODISTENSION, INSTILL MARCAIN AND PYRIDIUM;  Surgeon: Irine Seal, MD;  [facility];  Service: Urology;  Laterality: N/A;   CYSTO WITH HYDRODISTENSION N/A 01/15/2020   Procedure: CYSTOSCOPY/HYDRODISTENSION;  Surgeon: Cleon Gustin, MD;  [facility];  Service: Urology;  Laterality: N/A;   CYSTOSCOPY W/ URETERAL STENT PLACEMENT Bilateral 04/08/2016   Procedure: CYSTOSCOPY WITH BILATERAL RETROGRADE PYELOGRAM, BILATERAL URETERAL STENT PLACEMENT;  Surgeon: Cleon Gustin, MD;  [facility];  Service: Urology;  Laterality: Bilateral;   CYSTOSCOPY W/ URETERAL STENT PLACEMENT Left 04/15/2016   Procedure: CYSTOSCOPY WITH RETROGRADE PYELOGRAM/URETERAL STENT PLACEMENT;  Surgeon: Cleon Gustin, MD;  [facility];  Service: Urology;  Laterality: Left;   CYSTOSCOPY WITH BIOPSY N/A 01/15/2020   Procedure: CYSTOSCOPY WITH BLADDER BIOPSY AND FULGERATION;  Surgeon: Cleon Gustin, MD;  [facility];  Service: Urology;  Laterality: N/A;   CYSTOSCOPY WITH HOLMIUM LASER LITHOTRIPSY Right 04/08/2016   Procedure: CYSTOSCOPY WITH RIGHT RENAL STONE EXTRACTION WITH HOLMIUM LASER LITHOTRIPSY;  Surgeon: Cleon Gustin, MD;  [facility];  Service: Urology;  Laterality: Right;   CYSTOSCOPY WITH HOLMIUM LASER LITHOTRIPSY Left 04/15/2016   Procedure: CYSTOSCOPY WITH HOLMIUM LASER LITHOTRIPSY;  Surgeon: Cleon Gustin, MD;  [facility];  Service: Urology;  Laterality: Left;   CYSTOSCOPY WITH RETROGRADE PYELOGRAM, URETEROSCOPY AND STENT PLACEMENT Left 07/05/2017   Procedure: CYSTOSCOPY WITH RETROGRADE PYELOGRAM, URETEROSCOPY AND STENT PLACEMENT;  Surgeon: Cleon Gustin, MD;  [facility];  Service:  Urology;  Laterality: Left;   DIAGNOSTIC LAPAROSCOPY     DILATION AND CURETTAGE OF UTERUS     DILITATION & CURRETTAGE/HYSTROSCOPY WITH THERMACHOICE ABLATION N/A 09/07/2012   Procedure: DILATATION & CURETTAGE/HYSTEROSCOPY WITH THERMACHOICE ABLATION;  Surgeon: Florian Buff, MD;  [facility];  Service: Gynecology;  Laterality: N/A;   ESOPHAGOGASTRODUODENOSCOPY  10/14/10   ESOPHAGOGASTRODUODENOSCOPY N/A 01/04/2019   Procedure: ESOPHAGOGASTRODUODENOSCOPY (EGD);  Surgeon: Rogene Houston, MD;  [facility];  Service: Endoscopy;  Laterality: N/A;  9:30   EXTRACORPOREAL SHOCK WAVE LITHOTRIPSY  multiple   HOLMIUM LASER APPLICATION Left 8/84/1660   Procedure: HOLMIUM LASER APPLICATION;  Surgeon: Cleon Gustin, MD;  [facility];  Service: Urology;  Laterality: Left;   PERCUTANEOUS NEPHROSTOLITHOTOMY  2003   PLANTAR FASCIA SURGERY Bilateral right 2008//  left 2010   POLYPECTOMY  01/04/2019   Procedure: POLYPECTOMY;  Surgeon: Rogene Houston, MD;  [facility];  Service: Endoscopy;;  colon   STONE EXTRACTION WITH BASKET Left 04/15/2016   Procedure: STONE EXTRACTION WITH BASKET;  Surgeon: Cleon Gustin, MD;  [facility];  Service: Urology;  Laterality: Left;    OB History     Gravida  0  Para      Term      Preterm      AB      Living  0      SAB      IAB      Ectopic      Multiple      Live Births               Home Medications    Prior to Admission medications   Medication Sig Start Date End Date Taking? Authorizing Provider  cephALEXin (KEFLEX) 500 MG capsule Take 1 capsule (500 mg total) by mouth 4 (four) times daily for 5 days. 11/19/21 11/24/21 Yes Eulogio Bear, NP  ALPRAZolam Duanne Moron) 0.5 MG tablet Take 0.5 mg by mouth 3 (three) times daily as needed for anxiety.  11/10/19   [provider]  conjugated estrogens (PREMARIN) vaginal cream Use 0.5 gm daily for 2 weeks then 2-3 x a week 04/10/21   Estill Dooms, NP  cyclobenzaprine (FLEXERIL) 10 MG tablet Take 10 mg by mouth 3 (three) times daily as needed. 07/31/21   [provider]  DULoxetine (CYMBALTA) 60 MG capsule Take 60 mg by mouth daily.    [provider]  estradiol (ESTRACE) 1 MG tablet TAKE 1 TABLET(1 MG) BY MOUTH DAILY 04/10/21   Derrek Monaco A, NP  ezetimibe (ZETIA) 10 MG tablet Take 10 mg by mouth daily. 08/16/21   [provider]  fluticasone (FLONASE) 50 MCG/ACT nasal spray Place 2 sprays into both nostrils daily. 08/25/21   Jaynee Eagles, PA-C  HYDROcodone-acetaminophen (NORCO) 10-325 MG tablet Take 1 tablet by mouth 4 (four) times daily as needed for moderate pain. 01/15/20   McKenzie, Candee Furbish, MD  hydrOXYzine (ATARAX) 10 MG tablet Take 1 tablet (10 mg total) by mouth at bedtime. 10/01/21   McKenzie, Candee Furbish, MD  levocetirizine (XYZAL) 5 MG tablet Take 1 tablet (5 mg total) by mouth every evening. 08/20/21   Jaynee Eagles, PA-C  nitrofurantoin (MACRODANTIN) 50 MG capsule Take 1 capsule (50 mg total) by mouth at bedtime. 10/01/21   McKenzie, Candee Furbish, MD  pentosan polysulfate (ELMIRON) 100 MG capsule TAKE 1 CAPSULE(100 MG) BY MOUTH THREE TIMES DAILY 10/01/21   McKenzie, Candee Furbish, MD  potassium chloride SA (K-DUR,KLOR-CON) 20 MEQ tablet Take 40 mEq by mouth 2 (two) times daily.     [provider]  progesterone (PROMETRIUM) 200 MG capsule TAKE 1 CAPSULE(200 MG) BY MOUTH AT BEDTIME 04/10/21   Estill Dooms, NP  pseudoephedrine (SUDAFED) 60 MG tablet Take 1 tablet (60 mg total) by mouth every 8 (eight) hours as needed for congestion. Patient not taking: Reported on 10/01/2021 08/20/21   Jaynee Eagles, PA-C  rosuvastatin (CRESTOR) 10 MG tablet Take 10 mg by mouth daily. 08/11/21   [provider]  TREMFYA 100 MG/ML SOSY Inject into the skin. 02/17/21   [provider]  triamterene-hydrochlorothiazide (DYAZIDE) 37.5-25 MG capsule Take 1 capsule by mouth daily.  01/09/18   [provider]  Adalimumab (HUMIRA PEN) 40 MG/0.4ML PNKT Inject 40 mg into the skin every 14 (fourteen) days. Twice a month   08/09/19  [provider]  pantoprazole (PROTONIX) 40 MG tablet Take 1 tablet (40 mg total) by mouth daily before breakfast. 01/04/19 08/09/19  Rogene Houston, MD    Family History Family History  Problem Relation Age of Onset   Irritable bowel syndrome Mother    Scleroderma Mother    Rheum  arthritis Mother    Asthma Mother    Melanoma Father    Cancer Father        Melanoma   Colon cancer Cousin 71       second cousin Sunday Spillers Lovelace)   Cancer Maternal Grandmother        Ovarian   Diabetes Maternal Grandmother    Thyroid disease Maternal Aunt     Social History Social History   Tobacco Use   Smoking status: Never   Smokeless tobacco: Never  Vaping Use   Vaping Use: Never used  Substance Use Topics   Alcohol use: No   Drug use: No     Allergies   Hydromorphone, Prednisone, Tramadol, and Sulfa antibiotics   Review of Systems Review of Systems Per HPI  Physical Exam Triage Vital Signs ED Triage Vitals  Enc Vitals Group     BP 11/19/21 1505 (!) 145/78     Pulse Rate 11/19/21 1505 81     Resp 11/19/21 1505 18     Temp 11/19/21 1505 98.8 F (37.1 C)     Temp Source 11/19/21 1505 Oral     SpO2 11/19/21 1505 98 %     Weight --      Height --      Head Circumference --      Peak Flow --      Pain Score 11/19/21 1506 10     Pain Loc --      Pain Edu? --      Excl. in [place]? --    No data found.  Updated Vital Signs BP (!) 145/78 (BP Location: Right Arm)   Pulse 81   Temp 98.8 F (37.1 C) (Oral)   Resp 18   SpO2 98%   Visual Acuity Right Eye Distance:   Left Eye Distance:   Bilateral Distance:    Right Eye Near:   Left Eye Near:    Bilateral Near:     Physical Exam Vitals and nursing note reviewed.  Constitutional:      General: She is not in acute distress.    Appearance: Normal appearance. She is not toxic-appearing.   Pulmonary:     Effort: Pulmonary effort is normal. No respiratory distress.     Breath sounds: Normal breath sounds. No wheezing, rhonchi or rales.  Abdominal:     General: Abdomen is flat. Bowel sounds are normal. There is no distension.     Palpations: Abdomen is soft. There is no mass.     Tenderness: There is no abdominal tenderness. There is no right CVA tenderness, left CVA tenderness or guarding.  Skin:    General: Skin is warm and dry.     Coloration: Skin is not jaundiced or pale.     Findings: No erythema.  Neurological:     Mental Status: She is alert and oriented to person, place, and time.     Motor: No weakness.     Gait: Gait normal.  Psychiatric:        Behavior: Behavior is cooperative.     UC Treatments / Results  Labs (all labs ordered are listed, but only abnormal results are displayed) Labs Reviewed  POCT URINALYSIS DIP (MANUAL ENTRY) - Abnormal; Notable for the following components:      Result Value   Blood, UA trace-intact (*)    Leukocytes, UA Large (3+) (*)    All other components within normal limits  URINE CULTURE    EKG   Radiology No  results found.  Procedures Procedures (including critical care time)  Medications Ordered in UC Medications - No data to display  Initial Impression / Assessment and Plan / UC Course  I have reviewed the triage vital signs and the nursing notes.  Pertinent labs & imaging results that were available during my care of the patient were reviewed by me and considered in my medical decision making (see chart for details).   1. Dysuria - Urine Culture; Standing - Urine Culture  Urinalysis today is concerning for urinary tract infection.  Will send urine off for culture, however this may be altered since the patient has been on Macrobid for the past few days.  Treat with Keflex 4 times daily for 5 days.  Follow-up with primary care provider or urologist if symptoms persist despite treatment.  If symptoms worsen  and she develops fever/chills, nausea/vomiting and is unable to keep fluids down, she is go emergency room.  The patient was given the opportunity to ask questions.  All questions answered to their satisfaction.  The patient is in agreement to this plan.   Final Clinical Impressions(s) / UC Diagnoses   Final diagnoses:  Dysuria     Discharge Instructions      - The urinalysis today shows a small amount of blood, large amount of white blood cells -We are sending the urine for culture; please start the antibiotic (Keflex) and take it 4 times daily for 5 days in the meantime -Please seek care if your symptoms worsen despite treatment especially if you develop fever, severe back pain, or nausea/vomiting and are unable to keep fluids down   ED Prescriptions     Medication Sig Dispense Auth. Provider   cephALEXin (KEFLEX) 500 MG capsule Take 1 capsule (500 mg total) by mouth 4 (four) times daily for 5 days. 20 capsule Eulogio Bear, NP      PDMP not reviewed this encounter.   Eulogio Bear, NP 11/19/21 [phone]

## 2021-11-21 LAB — URINE CULTURE: Culture: 80000 — AB

## 2021-11-22 ENCOUNTER — Ambulatory Visit
Admission: RE | Admit: 2021-11-22 | Discharge: 2021-11-22 | Disposition: A | Discharge status: Home / Self Care | Payer: Medicare HMO | Source: Ambulatory Visit | Attending: Nurse Practitioner | Admitting: Nurse Practitioner

## 2021-11-22 VITALS — BP 127/75 | HR 78 | Temp 98.7°F | Resp 18

## 2021-11-22 DIAGNOSIS — N760 Acute vaginitis: Secondary | ICD-10-CM

## 2021-11-22 DIAGNOSIS — R102 Pelvic and perineal pain: Secondary | ICD-10-CM

## 2021-11-22 DIAGNOSIS — G8929 Other chronic pain: Secondary | ICD-10-CM

## 2021-11-22 DIAGNOSIS — R109 Unspecified abdominal pain: Secondary | ICD-10-CM | POA: Diagnosis present

## 2021-11-22 LAB — POCT URINALYSIS DIP (MANUAL ENTRY)
Bilirubin, UA: NEGATIVE
Glucose, UA: NEGATIVE mg/dL
Ketones, POC UA: NEGATIVE mg/dL
Nitrite, UA: NEGATIVE
Spec Grav, UA: 1.02 (ref 1.010–1.025)
Urobilinogen, UA: 0.2 E.U./dL
pH, UA: 7.5 (ref 5.0–8.0)

## 2021-11-22 MED ORDER — FLUCONAZOLE 150 MG PO TABS: 150.0000 mg | ORAL_TABLET | Freq: Every day | ORAL | 0 refills | Status: DC

## 2021-11-22 NOTE — Discharge Instructions (Signed)
Take medication as prescribed. As discussed, you will be contacted if your cytology results are positive to provide treatment. Please contact Dr. Noland Fordyce office on 11/24/2021 for an appointment for further evaluations for your continued symptoms. Follow-up as needed.

## 2021-11-22 NOTE — ED Provider Notes (Signed)
[facility]    CSN: [id number] Arrival date & time: 11/22/21  1233      History   Chief Complaint Chief Complaint  Patient presents with   Vaginal Itching    Pelvic pain,  burning  urination, stomach pain, bloating,  nausea. - Entered by patient    HPI Bethany King is a 51 y.o. female.    Vaginal Itching   Patient presents for continued urinary tract infection symptoms.  Patient states symptoms started approximately 2 weeks ago after she had sexual intercourse.  She was seen on 11/19/2021 for the same or similar symptoms.  A urine culture was done, which showed that she should stop the antibiotic for lactobacillus.  Patient states today she is now having pelvic pain and lower back pain with vaginal itching.  She was on antibiotics for about 2 to 3 days before stopping.  Patient has a significant history of interstitial cystitis along with nephrolithiasis.  Patient states that her pelvic pain is located in the left lower pelvic region, and states that she normally has menstrual symptoms approximately every 4 to 5 months as she is currently in perimenopause.  With regard to her back pain, she states that the pain is in the left lower portion of her back around her kidneys.  She has taken Gemtesa in the past for urinary frequency as she states this is also been increased over the past 2 weeks. Past Medical History:  Diagnosis Date   Allergic rhinitis    Anxiety    Arthritis    Bladder pain    Complication of anesthesia    Costochondritis    hx   DDD (degenerative disc disease), lumbar    Depression    GERD (gastroesophageal reflux disease)    Hepatic hemangioma 2006   stable   History of adenomatous polyp of colon    History of colitis    History of gastritis    History of kidney stones    HTN (hypertension)    Irritable bowel syndrome (IBS)    Medullary sponge kidney    left   Migraine    Nephrolithiasis    bilateral-- nonobstructive per CT   PONV  (postoperative nausea and vomiting)    Psoriatic arthritis ([place])    Psoriatic arthritis ([place])    Spinal stenosis    Urgency of urination     Patient Active Problem List   Diagnosis Date Noted   Overactive bladder 09/09/2020   Encounter for screening fecal occult blood testing 04/09/2020   Encounter for well woman exam with routine gynecological exam 04/09/2020   Hormone replacement therapy (HRT) 04/09/2020   Pelvic pain in female 04/05/2020   Urinary urgency 04/05/2020   Vaginal dryness 02/27/2020   Burning with urination 02/27/2020   Vaginal burning 02/27/2020   Chronic interstitial cystitis 02/09/2020   Menopause 04/04/2019   Screening for colorectal cancer 04/04/2019   Encounter for gynecological examination with Papanicolaou smear of cervix 04/04/2019   Routine cervical smear 04/04/2019   Vaginal dryness, menopausal 04/04/2019   Hot flashes 04/04/2019   Psoriatic arthritis ([place]) 12/26/2018   History of colonic polyps 12/26/2018   Pain of upper abdomen 12/26/2018   Abdominal pain 06/23/2014   Acute gastroenteritis 06/23/2014   Back pain with radiation 10/24/2012   Breast mass 10/09/2012   Seasonal allergies 09/22/2012   Hypokalemia 09/22/2012   Dysmenorrhea 08/30/2012   Sciatica 05/29/2012   Sinusitis 03/02/2012   Back pain 02/10/2012   Menorrhagia 12/30/2011  Shoulder pain, left 12/03/2011   Costochondritis 11/17/2011   Shoulder bursitis 11/17/2011   Recurrent UTI 10/09/2011   Migraine headache 10/08/2011   Obesity 09/04/2011   HTN (hypertension) 07/14/2011   Anxiety 07/14/2011   Diarrhea 05/19/2011   Chest pain 04/30/2011   IBS (irritable bowel syndrome) 09/23/2010   Epigastric pain 09/23/2010   CONSTIPATION 01/29/2010   GERD 01/24/2010    Past Surgical History:  Procedure Laterality Date   ANTERIOR CERVICAL DECOMP/DISCECTOMY FUSION  02/26/2014   C5 - C6; fusion with plate   BIOPSY  2/42/6834   Procedure: BIOPSY;  Surgeon: Rogene Houston, MD;   [facility];  Service: Endoscopy;;  gastric polyps   CARPAL TUNNEL RELEASE Bilateral right 04-01-2010/  left 05-30-2010   CHOLECYSTECTOMY N/A 07/14/2013   Procedure: LAPAROSCOPIC CHOLECYSTECTOMY;  Surgeon: Jamesetta So, MD;  Location: [facility];  Service: General;  Laterality: N/A;   COLONOSCOPY  10-13-2010   COLONOSCOPY N/A 01/04/2019   Procedure: COLONOSCOPY;  Surgeon: Rogene Houston, MD;  [facility];  Service: Endoscopy;  Laterality: N/A;   CYSTO WITH HYDRODISTENSION N/A 04/02/2015   Procedure: CYSTOSCOPY/HYDRODISTENSION, BLADDER BIOPSY WITH FULGURATION;  Surgeon: Irine Seal, MD;  Location[facility];  Service: Urology;  Laterality: N/A;   CYSTO WITH HYDRODISTENSION N/A 03/25/2018   Procedure: CYSTOSCOPY/HYDRODISTENSION, INSTILL MARCAIN AND PYRIDIUM;  Surgeon: Irine Seal, MD;  Location: [facility];  Service: Urology;  Laterality: N/A;   CYSTO WITH HYDRODISTENSION N/A 01/15/2020   Procedure: CYSTOSCOPY/HYDRODISTENSION;  Surgeon: Cleon Gustin, MD;  Location: [facility];  Service: Urology;  Laterality: N/A;   CYSTOSCOPY W/ URETERAL STENT PLACEMENT Bilateral 04/08/2016   Procedure: CYSTOSCOPY WITH BILATERAL RETROGRADE PYELOGRAM, BILATERAL URETERAL STENT PLACEMENT;  Surgeon: Cleon Gustin, MD;  Location: [facility];  Service: Urology;  Laterality: Bilateral;   CYSTOSCOPY W/ URETERAL STENT PLACEMENT Left 04/15/2016   Procedure: CYSTOSCOPY WITH RETROGRADE PYELOGRAM/URETERAL STENT PLACEMENT;  Surgeon: Cleon Gustin, MD;  Location: [facility];  Service: Urology;  Laterality: Left;   CYSTOSCOPY WITH BIOPSY N/A 01/15/2020   Procedure: CYSTOSCOPY WITH BLADDER BIOPSY AND FULGERATION;  Surgeon: Cleon Gustin, MD;  Location: [facility];  Service: Urology;  Laterality: N/A;   CYSTOSCOPY WITH HOLMIUM LASER LITHOTRIPSY Right 04/08/2016   Procedure: CYSTOSCOPY WITH RIGHT RENAL STONE EXTRACTION WITH HOLMIUM LASER LITHOTRIPSY;  Surgeon: Cleon Gustin, MD;  [facility]: [facility];  Service: Urology;  Laterality: Right;   CYSTOSCOPY WITH HOLMIUM LASER LITHOTRIPSY Left 04/15/2016   Procedure: CYSTOSCOPY WITH HOLMIUM LASER LITHOTRIPSY;  Surgeon: Cleon Gustin, MD;  [facility];  Service: Urology;  Laterality: Left;   CYSTOSCOPY WITH RETROGRADE PYELOGRAM, URETEROSCOPY AND STENT PLACEMENT Left 07/05/2017   Procedure: CYSTOSCOPY WITH RETROGRADE PYELOGRAM, URETEROSCOPY AND STENT PLACEMENT;  Surgeon: Cleon Gustin, MD;  Location: [facility];  Service: Urology;  Laterality: Left;   DIAGNOSTIC LAPAROSCOPY     DILATION AND CURETTAGE OF UTERUS     DILITATION & CURRETTAGE/HYSTROSCOPY WITH THERMACHOICE ABLATION N/A 09/07/2012   Procedure: DILATATION & CURETTAGE/HYSTEROSCOPY WITH THERMACHOICE ABLATION;  Surgeon: Florian Buff, MD;  Location: [facility];  Service: Gynecology;  Laterality: N/A;   ESOPHAGOGASTRODUODENOSCOPY  10/14/10   ESOPHAGOGASTRODUODENOSCOPY N/A 01/04/2019   Procedure: ESOPHAGOGASTRODUODENOSCOPY (EGD);  Surgeon: Rogene Houston, MD;  [facility];  Service: Endoscopy;  Laterality: N/A;  9:30   EXTRACORPOREAL SHOCK WAVE LITHOTRIPSY  multiple   HOLMIUM LASER APPLICATION Left 1/96/2229   Procedure: HOLMIUM LASER APPLICATION;  Surgeon: Cleon Gustin, MD;  Location: [facility];  Service: Urology;  Laterality: Left;   PERCUTANEOUS NEPHROSTOLITHOTOMY  2003   PLANTAR FASCIA SURGERY Bilateral right 2008//  left 2010   POLYPECTOMY  01/04/2019   Procedure: POLYPECTOMY;  Surgeon: Rogene Houston, MD;  [facility];  Service: Endoscopy;;  colon   STONE EXTRACTION WITH BASKET Left 04/15/2016   Procedure: STONE EXTRACTION WITH BASKET;  Surgeon: Cleon Gustin, MD;  Location: [facility];  Service: Urology;  Laterality: Left;    OB History     Gravida  0   Para      Term      Preterm      AB      Living  0      SAB      IAB      Ectopic      Multiple      Live Births               Home Medications    Prior to Admission  medications   Medication Sig Start Date End Date Taking? Authorizing Provider  fluconazole (DIFLUCAN) 150 MG tablet Take 1 tablet (150 mg total) by mouth daily. Take 1 tablet today, may repeat every 72 hours for up to 2 additional doses if symptoms persist. 11/22/21  Yes Morad Tal-Warren, Alda Lea, NP  ALPRAZolam Duanne [place]) 0.5 MG tablet Take 0.5 mg by mouth 3 (three) times daily as needed for anxiety.  11/10/19   [provider]  cephALEXin (KEFLEX) 500 MG capsule Take 1 capsule (500 mg total) by mouth 4 (four) times daily for 5 days. 11/19/21 11/24/21  Eulogio Bear, NP  conjugated estrogens (PREMARIN) vaginal cream Use 0.5 gm daily for 2 weeks then 2-3 x a week 04/10/21   Estill Dooms, NP  cyclobenzaprine (FLEXERIL) 10 MG tablet Take 10 mg by mouth 3 (three) times daily as needed. 07/31/21   [provider]  DULoxetine (CYMBALTA) 60 MG capsule Take 60 mg by mouth daily.    [provider]  estradiol (ESTRACE) 1 MG tablet TAKE 1 TABLET(1 MG) BY MOUTH DAILY 04/10/21   Derrek Monaco A, NP  ezetimibe (ZETIA) 10 MG tablet Take 10 mg by mouth daily. 08/16/21   [provider]  fluticasone (FLONASE) 50 MCG/ACT nasal spray Place 2 sprays into both nostrils daily. 08/25/21   Jaynee Eagles, PA-C  HYDROcodone-acetaminophen (NORCO) 10-325 MG tablet Take 1 tablet by mouth 4 (four) times daily as needed for moderate pain. 01/15/20   McKenzie, Candee Furbish, MD  hydrOXYzine (ATARAX) 10 MG tablet Take 1 tablet (10 mg total) by mouth at bedtime. 10/01/21   McKenzie, Candee Furbish, MD  levocetirizine (XYZAL) 5 MG tablet Take 1 tablet (5 mg total) by mouth every evening. 08/20/21   Jaynee Eagles, PA-C  nitrofurantoin (MACRODANTIN) 50 MG capsule Take 1 capsule (50 mg total) by mouth at bedtime. 10/01/21   McKenzie, Candee Furbish, MD  pentosan polysulfate (ELMIRON) 100 MG capsule TAKE 1 CAPSULE(100 MG) BY MOUTH THREE TIMES DAILY 10/01/21   McKenzie, Candee Furbish, MD  potassium chloride SA (K-DUR,KLOR-CON)  20 MEQ tablet Take 40 mEq by mouth 2 (two) times daily.     [provider]  progesterone (PROMETRIUM) 200 MG capsule TAKE 1 CAPSULE(200 MG) BY MOUTH AT BEDTIME 04/10/21   Estill Dooms, NP  pseudoephedrine (SUDAFED) 60 MG tablet Take 1 tablet (60 mg total) by mouth every 8 (eight) hours as needed for congestion. Patient not taking: Reported on 10/01/2021 08/20/21  Jaynee Eagles, PA-C  rosuvastatin (CRESTOR) 10 MG tablet Take 10 mg by mouth daily. 08/11/21   [provider]  TREMFYA 100 MG/ML SOSY Inject into the skin. 02/17/21   [provider]  triamterene-hydrochlorothiazide (DYAZIDE) 37.5-25 MG capsule Take 1 capsule by mouth daily.  01/09/18   [provider]  Adalimumab (HUMIRA PEN) 40 MG/0.4ML PNKT Inject 40 mg into the skin every 14 (fourteen) days. Twice a month   08/09/19  [provider]  pantoprazole (PROTONIX) 40 MG tablet Take 1 tablet (40 mg total) by mouth daily before breakfast. 01/04/19 08/09/19  Rogene Houston, MD    Family History Family History  Problem Relation Age of Onset   Irritable bowel syndrome Mother    Scleroderma Mother    Rheum arthritis Mother    Asthma Mother    Melanoma Father    Cancer Father        Melanoma   Colon cancer Cousin 70       second cousin Sunday Spillers Lovelace)   Cancer Maternal Grandmother        Ovarian   Diabetes Maternal Grandmother    Thyroid disease Maternal Aunt     Social History Social History   Tobacco Use   Smoking status: Never   Smokeless tobacco: Never  Vaping Use   Vaping Use: Never used  Substance Use Topics   Alcohol use: No   Drug use: No     Allergies   Hydromorphone, Prednisone, Tramadol, and Sulfa antibiotics   Review of Systems Review of Systems   Physical Exam Triage Vital Signs ED Triage Vitals  Enc Vitals Group     BP 11/22/21 1307 127/75     Pulse Rate 11/22/21 1307 78     Resp 11/22/21 1307 18     Temp 11/22/21 1307 98.7 F (37.1 C)     Temp  Source 11/22/21 1307 Oral     SpO2 11/22/21 1307 95 %     Weight --      Height --      Head Circumference --      Peak Flow --      Pain Score 11/22/21 1310 5     Pain Loc --      Pain Edu? --      Excl. in [place]? --    No data found.  Updated Vital Signs BP 127/75 (BP Location: Right Arm)   Pulse 78   Temp 98.7 F (37.1 C) (Oral)   Resp 18   SpO2 95%   Visual Acuity Right Eye Distance:   Left Eye Distance:   Bilateral Distance:    Right Eye Near:   Left Eye Near:    Bilateral Near:     Physical Exam Vitals reviewed.  Constitutional:      General: She is not in acute distress.    Appearance: She is well-developed.  Eyes:     Extraocular Movements: Extraocular movements intact.     Conjunctiva/sclera: Conjunctivae normal.     Pupils: Pupils are equal, round, and reactive to light.  Cardiovascular:     Rate and Rhythm: Normal rate and regular rhythm.     Heart sounds: Normal heart sounds.  Pulmonary:     Effort: Pulmonary effort is normal.     Breath sounds: Normal breath sounds.  Abdominal:     General: Bowel sounds are normal. There is no distension.     Palpations: Abdomen is soft.     Tenderness: There is no  abdominal tenderness. There is left CVA tenderness. There is no guarding or rebound.  Genitourinary:    Vagina: Normal. No vaginal discharge.  Musculoskeletal:     Cervical back: Normal range of motion.  Skin:    General: Skin is warm and dry.     Findings: No erythema or rash.  Neurological:     General: No focal deficit present.     Mental Status: She is alert and oriented to person, place, and time.     Cranial Nerves: No cranial nerve deficit.  Psychiatric:        Mood and Affect: Mood normal.        Behavior: Behavior normal.      UC Treatments / Results  Labs (all labs ordered are listed, but only abnormal results are displayed) Labs Reviewed  POCT URINALYSIS DIP (MANUAL ENTRY) - Abnormal; Notable for the following components:       Result Value   Blood, UA trace-intact (*)    Protein Ur, POC trace (*)    Leukocytes, UA Large (3+) (*)    All other components within normal limits  CERVICOVAGINAL ANCILLARY ONLY    EKG   Radiology No results found.  Procedures Procedures (including critical care time)  Medications Ordered in UC Medications - No data to display  Initial Impression / Assessment and Plan / UC Course  I have reviewed the triage vital signs and the nursing notes.  Pertinent labs & imaging results that were available during my care of the patient were reviewed by me and considered in my medical decision making (see chart for details).  Patient presents for follow-up after she was contacted and asked to return to our clinic to check for BV and yeast.  She was seen on 11/19/2021 and treated for urinary tract infection.  Her culture returned showing like she low bacillus.  She did stop the antibiotic.  After starting the antibiotic.  Patient developed vaginal itching and irritation.  Since she was seen on 11/19/2021, she has since developed pelvic pain.  After review the patient's chart, patient has a history of interstitial cystitis, urinary frequency treated by Dr. Alyson Ingles.  Patient attributes her current pelvic pain to possible ovulation symptoms, which she reports that she has every 4 to 5 months.  Patient also has a history of nephrolithiasis.  Lengthy discussion with the patient regarding best next steps for her.  Cytology was collected today to rule out STI/STD, do not feel this may be contributing to her symptoms.  Recommend patient follow-up with Dr. Alyson Ingles as this appears to be similar to symptoms that she experienced when she saw him in 2021.  Patient was advised to restart her Gemtesa as well.  We will also give the patient a prescription for Diflucan to help with her vulvovaginitis.  Patient verbalizes understanding and is in agreement with this plan of care.  Patient advised to follow-up as  needed. Final Clinical Impressions(s) / UC Diagnoses   Final diagnoses:  Vaginitis and vulvovaginitis  Pelvic pain  Left flank pain, chronic     Discharge Instructions      Take medication as prescribed. As discussed, you will be contacted if your cytology results are positive to provide treatment. Please contact Dr. Noland Fordyce office on 11/24/2021 for an appointment for further evaluations for your continued symptoms. Follow-up as needed.     ED Prescriptions     Medication Sig Dispense Auth. Provider   fluconazole (DIFLUCAN) 150 MG tablet Take 1 tablet (150 mg  total) by mouth daily. Take 1 tablet today, may repeat every 72 hours for up to 2 additional doses if symptoms persist. 3 tablet Esraa Seres-Warren, Alda Lea, NP      PDMP not reviewed this encounter.   Tish Men, NP 11/22/21 1437

## 2021-11-22 NOTE — ED Triage Notes (Signed)
Here for UTI symptoms on Wed.  Was given antibiotics.  Was called and told she may need to be swabbed for BV and told to stop antibiotic  States she is having pelvic pain and lower back pain and vaginal itching now.   Off and on low grade fever since last night.

## 2021-11-24 LAB — CERVICOVAGINAL ANCILLARY ONLY
Bacterial Vaginitis (gardnerella): POSITIVE — AB
Candida Glabrata: NEGATIVE
Candida Vaginitis: NEGATIVE
Chlamydia: NEGATIVE
Comment: NEGATIVE
Comment: NEGATIVE
Comment: NEGATIVE
Comment: NEGATIVE
Comment: NEGATIVE
Comment: NORMAL
Neisseria Gonorrhea: NEGATIVE
Trichomonas: NEGATIVE

## 2021-11-25 ENCOUNTER — Telehealth (HOSPITAL_COMMUNITY): Payer: Self-pay | Admitting: Emergency Medicine

## 2021-11-25 MED ORDER — METRONIDAZOLE 0.75 % VA GEL
1.0000 | Freq: Every day | VAGINAL | 0 refills | Status: AC
Start: 2021-11-25 — End: 2021-11-30

## 2021-12-09 ENCOUNTER — Ambulatory Visit: Payer: Medicare HMO | Admitting: Adult Health

## 2021-12-09 ENCOUNTER — Encounter: Payer: Self-pay | Admitting: Adult Health

## 2021-12-09 VITALS — BP 132/77 | HR 71 | Ht 62.5 in | Wt 179.0 lb

## 2021-12-09 DIAGNOSIS — K649 Unspecified hemorrhoids: Secondary | ICD-10-CM | POA: Diagnosis not present

## 2021-12-09 DIAGNOSIS — L290 Pruritus ani: Secondary | ICD-10-CM | POA: Diagnosis not present

## 2021-12-09 DIAGNOSIS — Z1211 Encounter for screening for malignant neoplasm of colon: Secondary | ICD-10-CM | POA: Diagnosis not present

## 2021-12-09 LAB — HEMOCCULT GUIAC POC 1CARD (OFFICE): Fecal Occult Blood, POC: NEGATIVE

## 2021-12-17 DIAGNOSIS — R109 Unspecified abdominal pain: Secondary | ICD-10-CM | POA: Diagnosis not present

## 2021-12-17 DIAGNOSIS — M5010 Cervical disc disorder with radiculopathy, unspecified cervical region: Secondary | ICD-10-CM | POA: Diagnosis not present

## 2021-12-17 DIAGNOSIS — K219 Gastro-esophageal reflux disease without esophagitis: Secondary | ICD-10-CM | POA: Diagnosis not present

## 2021-12-17 DIAGNOSIS — E6609 Other obesity due to excess calories: Secondary | ICD-10-CM | POA: Diagnosis not present

## 2021-12-17 DIAGNOSIS — I10 Essential (primary) hypertension: Secondary | ICD-10-CM | POA: Diagnosis not present

## 2021-12-17 DIAGNOSIS — Z6831 Body mass index (BMI) 31.0-31.9, adult: Secondary | ICD-10-CM | POA: Diagnosis not present

## 2021-12-17 DIAGNOSIS — K589 Irritable bowel syndrome without diarrhea: Secondary | ICD-10-CM | POA: Diagnosis not present

## 2021-12-19 DIAGNOSIS — R109 Unspecified abdominal pain: Secondary | ICD-10-CM | POA: Diagnosis not present

## 2021-12-19 DIAGNOSIS — E782 Mixed hyperlipidemia: Secondary | ICD-10-CM | POA: Diagnosis not present

## 2021-12-22 ENCOUNTER — Encounter (INDEPENDENT_AMBULATORY_CARE_PROVIDER_SITE_OTHER): Payer: Self-pay | Admitting: *Deleted

## 2022-01-05 ENCOUNTER — Ambulatory Visit (INDEPENDENT_AMBULATORY_CARE_PROVIDER_SITE_OTHER): Payer: Medicare HMO | Admitting: Gastroenterology

## 2022-01-05 ENCOUNTER — Other Ambulatory Visit (INDEPENDENT_AMBULATORY_CARE_PROVIDER_SITE_OTHER): Payer: Self-pay

## 2022-01-05 ENCOUNTER — Encounter (INDEPENDENT_AMBULATORY_CARE_PROVIDER_SITE_OTHER): Payer: Self-pay | Admitting: Gastroenterology

## 2022-01-05 ENCOUNTER — Encounter (INDEPENDENT_AMBULATORY_CARE_PROVIDER_SITE_OTHER): Payer: Self-pay

## 2022-01-05 VITALS — BP 126/82 | HR 78 | Temp 98.6°F | Ht 62.5 in | Wt 179.8 lb

## 2022-01-05 DIAGNOSIS — K921 Melena: Secondary | ICD-10-CM

## 2022-01-05 DIAGNOSIS — R140 Abdominal distension (gaseous): Secondary | ICD-10-CM | POA: Diagnosis not present

## 2022-01-05 DIAGNOSIS — R1013 Epigastric pain: Secondary | ICD-10-CM

## 2022-01-05 DIAGNOSIS — K219 Gastro-esophageal reflux disease without esophagitis: Secondary | ICD-10-CM | POA: Diagnosis not present

## 2022-01-05 DIAGNOSIS — R6881 Early satiety: Secondary | ICD-10-CM

## 2022-01-05 MED ORDER — ESOMEPRAZOLE MAGNESIUM 20 MG PO CPDR: 20.0000 mg | DELAYED_RELEASE_CAPSULE | Freq: Two times a day (BID) | ORAL | 3 refills | Status: DC

## 2022-01-05 NOTE — Progress Notes (Unsigned)
Referring Provider: Redmond School, MD Primary Care Physician:  Redmond School, MD Primary GI Physician: new  Chief Complaint  Patient presents with   Abdominal Pain    New patient. Referred for abdominal pain. States Epi gastric pain is always there and has started having lower abdominal pain since referral has been made. Having indigestion, bloating, heartburn, nausea. Abdominal pain radiates to back. Does not have much of an appetite. Pain is worse after eating.    HPI:   Bethany King is a 51 y.o. female with past medical history of anxiety, DDD, depression, GERD, hepatic hemangioma, HTN, psoriatic arthritis   Patient presenting today for abdominal pain.   She reports for the past few months she has been having epigastric pain, bloating, belching. She has heartburn and some acid regurgitation 2-3x/week. She is taking Nexium '20mg'$  daily, feels that this helps until dinner time, has added alka seltzer chewable at dinner when pain kind of flares up again. PCP gave her protonix, she took this for one week and had nausea and headache. She feels that appetite is low and she has early satiety. She denies any weight loss. Denies vomiting.   She has a BM daily but sometimes feels that she does not eliminate her bowels that well. She tends to have some constipation on occasion, drinks some apple juice which tends to provide good relief. About 1 month ago she had some bad constipation and straining which got her hemorrhoids flared up with some pain, bleeding and soreness when sitting down. She notes rectal bleeding maybe once per month during times of worsening constipation with a very hard stool. She will see blood in toilet and with wiping, bright red in color. Denies any melena. She does not feel that her hemorrhoids prolapse.   She is maintained on hydrocodone BID.  NSAID use:no NSAIDs  Social hx: no etoh or tobacco  Fam hx: no CRC or liver disease  Last Colonoscopy:01/04/19 - Two small  polyps in the cecum-tubular adenomas  - Diverticulosis at the hepatic flexure. - Internal hemorrhoids. - No specimens collected. Last Endoscopy:-01/04/19 Normal esophagus.- Normal esophagus. Outpatient - Z-line irregular, 38 cm from the incisors. - A few gastric polyps. Biopsied-fundic gland polyps - Normal duodenal bulb and second portion of the duodenum  Recommendations:    Past Medical History:  Diagnosis Date   Allergic rhinitis    Anxiety    Arthritis    Bladder pain    Complication of anesthesia    Costochondritis    hx   DDD (degenerative disc disease), lumbar    Depression    GERD (gastroesophageal reflux disease)    Hepatic hemangioma 2006   stable   History of adenomatous polyp of colon    History of colitis    History of gastritis    History of kidney stones    HTN (hypertension)    Irritable bowel syndrome (IBS)    Medullary sponge kidney    left   Migraine    Nephrolithiasis    bilateral-- nonobstructive per CT   PONV (postoperative nausea and vomiting)    Psoriatic arthritis (HCC)    Psoriatic arthritis (HCC)    Spinal stenosis    Urgency of urination     Past Surgical History:  Procedure Laterality Date   ANTERIOR CERVICAL DECOMP/DISCECTOMY FUSION  02/26/2014   C5 - C6; fusion with plate   BIOPSY  0/97/3532   Procedure: BIOPSY;  Surgeon: Rogene Houston, MD;  [facility]: [facility];  Service: Endoscopy;;  gastric polyps   CARPAL TUNNEL RELEASE Bilateral right 04-01-2010/  left 05-30-2010   CHOLECYSTECTOMY N/A 07/14/2013   Procedure: LAPAROSCOPIC CHOLECYSTECTOMY;  Surgeon: Jamesetta So, MD;  Location: [facility];  Service: General;  Laterality: N/A;   COLONOSCOPY  10-13-2010   COLONOSCOPY N/A 01/04/2019   Procedure: COLONOSCOPY;  Surgeon: Rogene Houston, MD;  [facility]: [facility];  Service: Endoscopy;  Laterality: N/A;   CYSTO WITH HYDRODISTENSION N/A 04/02/2015   Procedure: CYSTOSCOPY/HYDRODISTENSION, BLADDER BIOPSY WITH FULGURATION;  Surgeon:  Irine Seal, MD;  Location: [facility];  Service: Urology;  Laterality: N/A;   CYSTO WITH HYDRODISTENSION N/A 03/25/2018   Procedure: CYSTOSCOPY/HYDRODISTENSION, INSTILL MARCAIN AND PYRIDIUM;  Surgeon: Irine Seal, MD;  Location: [facility];  Service: Urology;  Laterality: N/A;   CYSTO WITH HYDRODISTENSION N/A 01/15/2020   Procedure: CYSTOSCOPY/HYDRODISTENSION;  Surgeon: Cleon Gustin, MD;  Location: [facility];  Service: Urology;  Laterality: N/A;   CYSTOSCOPY W/ URETERAL STENT PLACEMENT Bilateral 04/08/2016   Procedure: CYSTOSCOPY WITH BILATERAL RETROGRADE PYELOGRAM, BILATERAL URETERAL STENT PLACEMENT;  Surgeon: Cleon Gustin, MD;  Location: [facility];  Service: Urology;  Laterality: Bilateral;   CYSTOSCOPY W/ URETERAL STENT PLACEMENT Left 04/15/2016   Procedure: CYSTOSCOPY WITH RETROGRADE PYELOGRAM/URETERAL STENT PLACEMENT;  Surgeon: Cleon Gustin, MD;  Location: [facility];  Service: Urology;  Laterality: Left;   CYSTOSCOPY WITH BIOPSY N/A 01/15/2020   Procedure: CYSTOSCOPY WITH BLADDER BIOPSY AND FULGERATION;  Surgeon: Cleon Gustin, MD;  Location: [facility];  Service: Urology;  Laterality: N/A;   CYSTOSCOPY WITH HOLMIUM LASER LITHOTRIPSY Right 04/08/2016   Procedure: CYSTOSCOPY WITH RIGHT RENAL STONE EXTRACTION WITH HOLMIUM LASER LITHOTRIPSY;  Surgeon: Cleon Gustin, MD;  Location: [facility];  Service: Urology;  Laterality: Right;   CYSTOSCOPY WITH HOLMIUM LASER LITHOTRIPSY Left 04/15/2016   Procedure: CYSTOSCOPY WITH HOLMIUM LASER LITHOTRIPSY;  Surgeon: Cleon Gustin, MD;  Location: [facility];  Service: Urology;  Laterality: Left;   CYSTOSCOPY WITH RETROGRADE PYELOGRAM, URETEROSCOPY AND STENT PLACEMENT Left 07/05/2017   Procedure: CYSTOSCOPY WITH RETROGRADE PYELOGRAM, URETEROSCOPY AND STENT PLACEMENT;  Surgeon: Cleon Gustin, MD;  Location: [facility];  Service: Urology;  Laterality: Left;   DIAGNOSTIC LAPAROSCOPY     DILATION AND CURETTAGE OF UTERUS     DILITATION &  CURRETTAGE/HYSTROSCOPY WITH THERMACHOICE ABLATION N/A 09/07/2012   Procedure: DILATATION & CURETTAGE/HYSTEROSCOPY WITH THERMACHOICE ABLATION;  Surgeon: Florian Buff, MD;  Location: [facility];  Service: Gynecology;  Laterality: N/A;   ESOPHAGOGASTRODUODENOSCOPY  10/14/10   ESOPHAGOGASTRODUODENOSCOPY N/A 01/04/2019   Procedure: ESOPHAGOGASTRODUODENOSCOPY (EGD);  Surgeon: Rogene Houston, MD;  [facility];  Service: Endoscopy;  Laterality: N/A;  9:30   EXTRACORPOREAL SHOCK WAVE LITHOTRIPSY  multiple   HOLMIUM LASER APPLICATION Left 3/61/4431   Procedure: HOLMIUM LASER APPLICATION;  Surgeon: Cleon Gustin, MD;  Location: [facility];  Service: Urology;  Laterality: Left;   PERCUTANEOUS NEPHROSTOLITHOTOMY  2003   PLANTAR FASCIA SURGERY Bilateral right 2008//  left 2010   POLYPECTOMY  01/04/2019   Procedure: POLYPECTOMY;  Surgeon: Rogene Houston, MD;  [facility];  Service: Endoscopy;;  colon   STONE EXTRACTION WITH BASKET Left 04/15/2016   Procedure: STONE EXTRACTION WITH BASKET;  Surgeon: Cleon Gustin, MD;  Location: [facility];  Service: Urology;  Laterality: Left;    Current Outpatient Medications  Medication Sig Dispense Refill   ALPRAZolam (XANAX) 0.5 MG tablet Take 0.5 mg by mouth 3 (three) times daily as needed  for anxiety. One qid prn     Calcium Carbonate Antacid (ALKA-SELTZER ANTACID PO) Take by mouth. Takes about 2 per day after dinner     Cholecalciferol (VITAMIN D3) 125 MCG (5000 UT) CAPS Take by mouth daily.     cyclobenzaprine (FLEXERIL) 10 MG tablet Take 10 mg by mouth 3 (three) times daily as needed.     dicyclomine (BENTYL) 20 MG tablet Take 20 mg by mouth. One four times daily as needed     DULoxetine (CYMBALTA) 60 MG capsule Take 60 mg by mouth daily.     esomeprazole (NEXIUM) 20 MG capsule Take 20 mg by mouth. One in the mornings     estradiol (ESTRACE) 1 MG tablet TAKE 1 TABLET(1 MG) BY MOUTH DAILY 30 tablet 12   HYDROcodone-acetaminophen (NORCO)  10-325 MG tablet Take 1 tablet by mouth 4 (four) times daily as needed for moderate pain. 30 tablet 0   hydrOXYzine (ATARAX) 10 MG tablet Take 1 tablet (10 mg total) by mouth at bedtime. 30 tablet 11   pentosan polysulfate (ELMIRON) 100 MG capsule TAKE 1 CAPSULE(100 MG) BY MOUTH THREE TIMES DAILY 90 capsule 3   potassium chloride SA (K-DUR,KLOR-CON) 20 MEQ tablet Take 40 mEq by mouth daily.     progesterone (PROMETRIUM) 200 MG capsule TAKE 1 CAPSULE(200 MG) BY MOUTH AT BEDTIME 30 capsule 12   shark liver oil-cocoa butter (PREPARATION H) 0.25-3-85.5 % suppository Place 1 suppository rectally as needed for hemorrhoids.     TREMFYA 100 MG/ML SOSY Inject into the skin. One injection every 2 months     triamterene-hydrochlorothiazide (DYAZIDE) 37.5-25 MG capsule Take 1 capsule by mouth daily.   11   No current facility-administered medications for this visit.    Allergies as of 01/05/2022 - Review Complete 01/05/2022  Allergen Reaction Noted   Hydromorphone Itching 04/29/2011   Prednisone Other (See Comments) 07/08/2012   Tramadol Other (See Comments) 02/19/2021   Sulfa antibiotics Rash 03/26/2015    Family History  Problem Relation Age of Onset   Irritable bowel syndrome Mother    Scleroderma Mother    Rheum arthritis Mother    Asthma Mother    Melanoma Father    Cancer Father        Melanoma   Colon cancer Cousin 37       second cousin Sunday Spillers Lovelace)   Cancer Maternal Grandmother        Ovarian   Diabetes Maternal Grandmother    Thyroid disease Maternal Aunt     Social History   Socioeconomic History   Marital status: Divorced    Spouse name: Not on file   Number of children: 0   Years of education: Not on file   Highest education level: Not on file  Occupational History   Occupation: ORDER PROCESSER    Employer: [employer]  Tobacco Use   Smoking status: Never    Passive exposure: Never   Smokeless tobacco: Never  Vaping Use   Vaping Use: Never used   Substance and Sexual Activity   Alcohol use: No   Drug use: No   Sexual activity: Yes    Birth control/protection: Surgical, Post-menopausal    Comment: ablation  Other Topics Concern   Not on file  Social History Narrative   Not on file   Social Determinants of Health   Financial Resource Strain: Low Risk  (04/10/2021)   Overall Financial Resource Strain (CARDIA)    Difficulty of Paying Living Expenses: Not very hard  Food Insecurity: No Food Insecurity (04/10/2021)   Hunger Vital Sign    Worried About Running Out of Food in the Last Year: Never true    Ran Out of Food in the Last Year: Never true  Transportation Needs: No Transportation Needs (04/10/2021)   PRAPARE - Hydrologist (Medical): No    Lack of Transportation (Non-Medical): No  Physical Activity: Insufficiently Active (04/10/2021)   Exercise Vital Sign    Days of Exercise per Week: 2 days    Minutes of Exercise per Session: 30 min  Stress: Stress Concern Present (04/10/2021)   [place]    Feeling of Stress : To some extent  Social Connections: Moderately Integrated (04/10/2021)   Social Connection and Isolation Panel [NHANES]    Frequency of Communication with Friends and Family: Three times a week    Frequency of Social Gatherings with Friends and Family: Twice a week    Attends Religious Services: More than 4 times per year    Active Member of [employer] or Organizations: Yes    Attends Archivist Meetings: 1 to 4 times per year    Marital Status: Divorced    Review of systems General: negative for malaise, night sweats, fever, chills, weight los Neck: Negative for lumps, goiter, pain and significant neck swelling Resp: Negative for cough, wheezing, dyspnea at rest CV: Negative for chest pain, leg swelling, palpitations, orthopnea GI: denies melena, hematochezia, nausea, vomiting, diarrhea, constipation,  dysphagia, odyonophagia, early satiety or unintentional weight loss.  MSK: Negative for joint pain or swelling, back pain, and muscle pain. Derm: Negative for itching or rash Psych: Denies depression, anxiety, memory loss, confusion. No homicidal or suicidal ideation.  Heme: Negative for prolonged bleeding, bruising easily, and swollen nodes. Endocrine: Negative for cold or heat intolerance, polyuria, polydipsia and goiter. Neuro: negative for tremor, gait imbalance, syncope and seizures. The remainder of the review of systems is noncontributory.  Physical Exam: BP 126/82 (BP Location: Left Arm, Patient Position: Sitting, Cuff Size: Large)   Pulse 78   Temp 98.6 F (37 C) (Oral)   Ht 5' 2.5" (1.588 m)   Wt 179 lb 12.8 oz (81.6 kg)   LMP 11/25/2021   BMI 32.36 kg/m  General:   Alert and oriented. No distress noted. Pleasant and cooperative.  Head:  Normocephalic and atraumatic. Eyes:  Conjuctiva clear without scleral icterus. Mouth:  Oral mucosa pink and moist. Good dentition. No lesions. Heart: Normal rate and rhythm, s1 and s2 heart sounds present.  Lungs: Clear lung sounds in all lobes. Respirations equal and unlabored. Abdomen:  +BS, soft, non-tender and non-distended. No rebound or guarding. No HSM or masses noted. Derm: No palmar erythema or jaundice Msk:  Symmetrical without gross deformities. Normal posture. Extremities:  Without edema. Neurologic:  Alert and  oriented x4 Psych:  Alert and cooperative. Normal mood and affect.  Invalid input(s): "6 MONTHS"   ASSESSMENT: PURA PICINICH is a 51 y.o. female presenting today    PLAN:  Rx nexium '20mg'$  BID  2. Schedule EGD and Colonoscopy  3. Consider hemorrhoid banding 4. Benefiber 1T TID with meals 5. Continue high water intake   Follow Up: 3 months   Jonahtan Manseau L. Alver Sorrow, MSN, APRN, AGNP-C Adult-Gerontology Nurse Practitioner [facility]

## 2022-01-05 NOTE — Patient Instructions (Signed)
I have sent nexium '20mg'$  twice daily to your pharmacy  Please take this 30 minutes prior to breakfast and again about 30 minutes prior to dinner Avoid greasy, spicy, fried, citrus foods, and be mindful that caffeine, carbonated drinks, chocolate and alcohol can increase reflux symptoms Stay upright 2-3 hours after eating, prior to lying down and avoid eating late in the evenings.  We will get you scheduled for upper endoscopy and colonoscopy for further evaluation of your symptoms Please continue with plenty of water, you can start benefiber 1T three times daily with meals to help soften stools, please avoid straining and limit toilet time to <5 minutes as this can put added pressure on hemorrhoids  We can consider setting you up for hemorrhoid banding if colonoscopy reveals hemorrhoids are the cause of your ongoing rectal bleeding

## 2022-01-06 ENCOUNTER — Encounter (INDEPENDENT_AMBULATORY_CARE_PROVIDER_SITE_OTHER): Payer: Self-pay

## 2022-01-06 DIAGNOSIS — R6881 Early satiety: Secondary | ICD-10-CM | POA: Insufficient documentation

## 2022-01-06 DIAGNOSIS — R140 Abdominal distension (gaseous): Secondary | ICD-10-CM | POA: Insufficient documentation

## 2022-01-06 DIAGNOSIS — K921 Melena: Secondary | ICD-10-CM | POA: Insufficient documentation

## 2022-01-07 ENCOUNTER — Telehealth (INDEPENDENT_AMBULATORY_CARE_PROVIDER_SITE_OTHER): Payer: Self-pay

## 2022-01-07 NOTE — Telephone Encounter (Signed)
Good Morning Bethany King, Bethany King is calling stating that she was seen yesterday and was supposed to get a Rx for Nexium called in to her pharmacy and they dont have it , please advise?

## 2022-01-09 ENCOUNTER — Other Ambulatory Visit (INDEPENDENT_AMBULATORY_CARE_PROVIDER_SITE_OTHER): Payer: Self-pay | Admitting: *Deleted

## 2022-01-09 ENCOUNTER — Telehealth (INDEPENDENT_AMBULATORY_CARE_PROVIDER_SITE_OTHER): Payer: Self-pay | Admitting: *Deleted

## 2022-01-09 MED ORDER — ESOMEPRAZOLE MAGNESIUM 20 MG PO CPDR: 20.0000 mg | DELAYED_RELEASE_CAPSULE | Freq: Two times a day (BID) | ORAL | 3 refills | Status: DC

## 2022-01-09 NOTE — Telephone Encounter (Signed)
Pt called and states [facility] [address] did not receive rx for nexium. I resent rx to pharm. Pt was notified.

## 2022-01-19 ENCOUNTER — Telehealth (INDEPENDENT_AMBULATORY_CARE_PROVIDER_SITE_OTHER): Payer: Self-pay

## 2022-01-19 DIAGNOSIS — M1990 Unspecified osteoarthritis, unspecified site: Secondary | ICD-10-CM | POA: Diagnosis not present

## 2022-01-19 DIAGNOSIS — Z79899 Other long term (current) drug therapy: Secondary | ICD-10-CM | POA: Diagnosis not present

## 2022-01-19 DIAGNOSIS — M79643 Pain in unspecified hand: Secondary | ICD-10-CM | POA: Diagnosis not present

## 2022-01-19 DIAGNOSIS — M25579 Pain in unspecified ankle and joints of unspecified foot: Secondary | ICD-10-CM | POA: Diagnosis not present

## 2022-01-19 DIAGNOSIS — M2550 Pain in unspecified joint: Secondary | ICD-10-CM | POA: Diagnosis not present

## 2022-01-19 DIAGNOSIS — L409 Psoriasis, unspecified: Secondary | ICD-10-CM | POA: Diagnosis not present

## 2022-01-19 DIAGNOSIS — L4050 Arthropathic psoriasis, unspecified: Secondary | ICD-10-CM | POA: Diagnosis not present

## 2022-01-19 DIAGNOSIS — N3010 Interstitial cystitis (chronic) without hematuria: Secondary | ICD-10-CM | POA: Diagnosis not present

## 2022-01-19 NOTE — Telephone Encounter (Signed)
Wants prescription for esomeprazole 20 mg increased to 40 mg as insurance will not pay for the 20 mg since available over the counter.

## 2022-01-20 ENCOUNTER — Other Ambulatory Visit (INDEPENDENT_AMBULATORY_CARE_PROVIDER_SITE_OTHER): Payer: Self-pay | Admitting: Gastroenterology

## 2022-01-20 MED ORDER — ESOMEPRAZOLE MAGNESIUM 40 MG PO CPDR: 40.0000 mg | DELAYED_RELEASE_CAPSULE | Freq: Every day | ORAL | 3 refills | Status: DC

## 2022-01-20 NOTE — Telephone Encounter (Signed)
Patient aware of all.

## 2022-02-04 NOTE — Patient Instructions (Signed)
Bethany King  02/04/2022     '@PREFPERIOPPHARMACY'$ @   Your procedure is scheduled on  02/10/2022.   Report to [facility] at  [facility]   Call this number if you have problems the morning of surgery:  [phone]   Remember:  Follow the diet and prep instructions given to you by the office.     Take these medicines the morning of surgery with A SIP OF WATER              Xanax (if needed), cymbalta, nexium, norco (if needed), elmiron.     Do not wear jewelry, make-up or nail polish.  Do not wear lotions, powders, or perfumes, or deodorant.  Do not shave 48 hours prior to surgery.  Men may shave face and neck.  Do not bring valuables to the hospital.  [facility] is not responsible for any belongings or valuables.  Contacts, dentures or bridgework may not be worn into surgery.  Leave your suitcase in the car.  After surgery it may be brought to your room.  For patients admitted to the hospital, discharge time will be determined by your treatment team.  Patients discharged the day of surgery will not be allowed to drive home and must have someone with them for 24 hours.    Special instructions:   DO NOT smoke tobacco or vape for 24 hours before your procedure.  Please read over the following fact sheets that you were given. Anesthesia Post-op Instructions and Care and Recovery After Surgery      Upper Endoscopy, Adult, Care After After the procedure, it is common to have a sore throat. It is also common to have: Mild stomach pain or discomfort. Bloating. Nausea. Follow these instructions at home: The instructions below may help you care for yourself at home. Your health care provider may give you more instructions. If you have questions, ask your health care provider. If you were given a sedative during the procedure, it can affect you for several hours. Do not drive or operate machinery until your health care provider says that it is safe. If you will  be going home right after the procedure, plan to have a responsible adult: Take you home from the hospital or clinic. You will not be allowed to drive. Care for you for the time you are told. Follow instructions from your health care provider about what you may eat and drink. Return to your normal activities as told by your health care provider. Ask your health care provider what activities are safe for you. Take over-the-counter and prescription medicines only as told by your health care provider. Contact a health care provider if you: Have a sore throat that lasts longer than one day. Have trouble swallowing. Have a fever. Get help right away if you: Vomit blood or your vomit looks like coffee grounds. Have bloody, black, or tarry stools. Have a very bad sore throat or you cannot swallow. Have difficulty breathing or very bad pain in your chest or abdomen. These symptoms may be an emergency. Get help right away. Call 911. Do not wait to see if the symptoms will go away. Do not drive yourself to the hospital. Summary After the procedure, it is common to have a sore throat, mild stomach discomfort, bloating, and nausea. If you were given a sedative during the procedure, it can affect you for several hours. Do not drive until your health care provider  says that it is safe. Follow instructions from your health care provider about what you may eat and drink. Return to your normal activities as told by your health care provider. This information is not intended to replace advice given to you by your health care provider. Make sure you discuss any questions you have with your health care provider. Document Revised: 09/10/2021 Document Reviewed: 09/10/2021 Elsevier Patient Education  [place]. Colonoscopy, Adult, Care After The following information offers guidance on how to care for yourself after your procedure. Your health care provider may also give you more specific instructions.  If you have problems or questions, contact your health care provider. What can I expect after the procedure? After the procedure, it is common to have: A small amount of blood in your stool for 24 hours after the procedure. Some gas. Mild cramping or bloating of your abdomen. Follow these instructions at home: Eating and drinking  Drink enough fluid to keep your urine pale yellow. Follow instructions from your health care provider about eating or drinking restrictions. Resume your normal diet as told by your health care provider. Avoid heavy or fried foods that are hard to digest. Activity Rest as told by your health care provider. Avoid sitting for a long time without moving. Get up to take short walks every 1-2 hours. This is important to improve blood flow and breathing. Ask for help if you feel weak or unsteady. Return to your normal activities as told by your health care provider. Ask your health care provider what activities are safe for you. Managing cramping and bloating  Try walking around when you have cramps or feel bloated. If directed, apply heat to your abdomen as told by your health care provider. Use the heat source that your health care provider recommends, such as a moist heat pack or a heating pad. Place a towel between your skin and the heat source. Leave the heat on for 20-30 minutes. Remove the heat if your skin turns bright red. This is especially important if you are unable to feel pain, heat, or cold. You have a greater risk of getting burned. General instructions If you were given a sedative during the procedure, it can affect you for several hours. Do not drive or operate machinery until your health care provider says that it is safe. For the first 24 hours after the procedure: Do not sign important documents. Do not drink alcohol. Do your regular daily activities at a slower pace than normal. Eat soft foods that are easy to digest. Take over-the-counter and  prescription medicines only as told by your health care provider. Keep all follow-up visits. This is important. Contact a health care provider if: You have blood in your stool 2-3 days after the procedure. Get help right away if: You have more than a small spotting of blood in your stool. You have large blood clots in your stool. You have swelling of your abdomen. You have nausea or vomiting. You have a fever. You have increasing pain in your abdomen that is not relieved with medicine. These symptoms may be an emergency. Get help right away. Call 911. Do not wait to see if the symptoms will go away. Do not drive yourself to the hospital. Summary After the procedure, it is common to have a small amount of blood in your stool. You may also have mild cramping and bloating of your abdomen. If you were given a sedative during the procedure, it can affect you for  several hours. Do not drive or operate machinery until your health care provider says that it is safe. Get help right away if you have a lot of blood in your stool, nausea or vomiting, a fever, or increased pain in your abdomen. This information is not intended to replace advice given to you by your health care provider. Make sure you discuss any questions you have with your health care provider. Document Revised: 01/22/2021 Document Reviewed: 01/22/2021 Elsevier Patient Education  [place] After This sheet gives you information about how to care for yourself after your procedure. Your health care provider may also give you more specific instructions. If you have problems or questions, contact your health care provider. What can I expect after the procedure? After the procedure, it is common to have: Tiredness. Forgetfulness about what happened after the procedure. Impaired judgment for important decisions. Nausea or vomiting. Some difficulty with balance. Follow these instructions at  home: For the time period you were told by your health care provider:     Rest as needed. Do not participate in activities where you could fall or become injured. Do not drive or use machinery. Do not drink alcohol. Do not take sleeping pills or medicines that cause drowsiness. Do not make important decisions or sign legal documents. Do not take care of children on your own. Eating and drinking Follow the diet that is recommended by your health care provider. Drink enough fluid to keep your urine pale yellow. If you vomit: Drink water, juice, or soup when you can drink without vomiting. Make sure you have little or no nausea before eating solid foods. General instructions Have a responsible adult stay with you for the time you are told. It is important to have someone help care for you until you are awake and alert. Take over-the-counter and prescription medicines only as told by your health care provider. If you have sleep apnea, surgery and certain medicines can increase your risk for breathing problems. Follow instructions from your health care provider about wearing your sleep device: Anytime you are sleeping, including during daytime naps. While taking prescription pain medicines, sleeping medicines, or medicines that make you drowsy. Avoid smoking. Keep all follow-up visits as told by your health care provider. This is important. Contact a health care provider if: You keep feeling nauseous or you keep vomiting. You feel light-headed. You are still sleepy or having trouble with balance after 24 hours. You develop a rash. You have a fever. You have redness or swelling around the IV site. Get help right away if: You have trouble breathing. You have new-onset confusion at home. Summary For several hours after your procedure, you may feel tired. You may also be forgetful and have poor judgment. Have a responsible adult stay with you for the time you are told. It is important to  have someone help care for you until you are awake and alert. Rest as told. Do not drive or operate machinery. Do not drink alcohol or take sleeping pills. Get help right away if you have trouble breathing, or if you suddenly become confused. This information is not intended to replace advice given to you by your health care provider. Make sure you discuss any questions you have with your health care provider. Document Revised: 05/06/2021 Document Reviewed: 05/04/2019 Elsevier Patient Education  [place].

## 2022-02-05 ENCOUNTER — Other Ambulatory Visit (HOSPITAL_COMMUNITY): Payer: Medicare HMO

## 2022-02-05 ENCOUNTER — Other Ambulatory Visit (INDEPENDENT_AMBULATORY_CARE_PROVIDER_SITE_OTHER): Payer: Self-pay | Admitting: Gastroenterology

## 2022-02-05 ENCOUNTER — Encounter (HOSPITAL_COMMUNITY)
Admission: RE | Admit: 2022-02-05 | Discharge: 2022-02-05 | Disposition: A | Discharge status: Home / Self Care | Payer: Medicare HMO | Source: Ambulatory Visit | Attending: Gastroenterology | Admitting: Gastroenterology

## 2022-02-05 VITALS — BP 128/75 | HR 77 | Temp 98.5°F | Resp 18 | Ht 62.5 in | Wt 179.9 lb

## 2022-02-05 DIAGNOSIS — R1013 Epigastric pain: Secondary | ICD-10-CM | POA: Diagnosis not present

## 2022-02-05 DIAGNOSIS — K921 Melena: Secondary | ICD-10-CM | POA: Insufficient documentation

## 2022-02-05 DIAGNOSIS — R6881 Early satiety: Secondary | ICD-10-CM | POA: Diagnosis not present

## 2022-02-05 DIAGNOSIS — Z01818 Encounter for other preprocedural examination: Secondary | ICD-10-CM | POA: Insufficient documentation

## 2022-02-05 DIAGNOSIS — I10 Essential (primary) hypertension: Secondary | ICD-10-CM | POA: Insufficient documentation

## 2022-02-05 LAB — BASIC METABOLIC PANEL
Anion gap: 9 (ref 5–15)
BUN: 15 mg/dL (ref 6–20)
CO2: 24 mmol/L (ref 22–32)
Calcium: 9.1 mg/dL (ref 8.9–10.3)
Chloride: 106 mmol/L (ref 98–111)
Creatinine, Ser: 0.73 mg/dL (ref 0.44–1.00)
GFR, Estimated: >60 mL/min (ref >60)
Glucose, Bld: 102 mg/dL — ABNORMAL HIGH (ref 70–99)
Potassium: 3.3 mmol/L — ABNORMAL LOW (ref 3.5–5.1)
Sodium: 139 mmol/L (ref 135–145)

## 2022-02-05 LAB — POCT PREGNANCY, URINE: Preg Test, Ur: NEGATIVE

## 2022-02-05 NOTE — Progress Notes (Signed)
error 

## 2022-02-10 ENCOUNTER — Ambulatory Visit (HOSPITAL_BASED_OUTPATIENT_CLINIC_OR_DEPARTMENT_OTHER): Payer: Medicare HMO | Admitting: Anesthesiology

## 2022-02-10 ENCOUNTER — Other Ambulatory Visit: Payer: Self-pay

## 2022-02-10 ENCOUNTER — Encounter (HOSPITAL_COMMUNITY): Payer: Self-pay | Admitting: Gastroenterology

## 2022-02-10 ENCOUNTER — Ambulatory Visit (HOSPITAL_COMMUNITY)
Admission: RE | Admit: 2022-02-10 | Discharge: 2022-02-10 | Disposition: A | Discharge status: Home / Self Care | Payer: Medicare HMO | Attending: Gastroenterology | Admitting: Gastroenterology

## 2022-02-10 ENCOUNTER — Encounter (HOSPITAL_COMMUNITY)
Admission: RE | Disposition: A | Discharge status: Home / Self Care | Payer: Self-pay | Source: Home / Self Care | Attending: Gastroenterology

## 2022-02-10 ENCOUNTER — Ambulatory Visit (HOSPITAL_COMMUNITY): Payer: Medicare HMO | Admitting: Anesthesiology

## 2022-02-10 DIAGNOSIS — K317 Polyp of stomach and duodenum: Secondary | ICD-10-CM | POA: Diagnosis not present

## 2022-02-10 DIAGNOSIS — K648 Other hemorrhoids: Secondary | ICD-10-CM | POA: Diagnosis not present

## 2022-02-10 DIAGNOSIS — K649 Unspecified hemorrhoids: Secondary | ICD-10-CM | POA: Diagnosis not present

## 2022-02-10 DIAGNOSIS — K219 Gastro-esophageal reflux disease without esophagitis: Secondary | ICD-10-CM | POA: Diagnosis not present

## 2022-02-10 DIAGNOSIS — K319 Disease of stomach and duodenum, unspecified: Secondary | ICD-10-CM | POA: Insufficient documentation

## 2022-02-10 DIAGNOSIS — G709 Myoneural disorder, unspecified: Secondary | ICD-10-CM | POA: Diagnosis not present

## 2022-02-10 DIAGNOSIS — K921 Melena: Secondary | ICD-10-CM

## 2022-02-10 DIAGNOSIS — R1013 Epigastric pain: Secondary | ICD-10-CM | POA: Diagnosis not present

## 2022-02-10 DIAGNOSIS — K625 Hemorrhage of anus and rectum: Secondary | ICD-10-CM | POA: Insufficient documentation

## 2022-02-10 DIAGNOSIS — K589 Irritable bowel syndrome without diarrhea: Secondary | ICD-10-CM | POA: Insufficient documentation

## 2022-02-10 DIAGNOSIS — I10 Essential (primary) hypertension: Secondary | ICD-10-CM | POA: Diagnosis not present

## 2022-02-10 DIAGNOSIS — F418 Other specified anxiety disorders: Secondary | ICD-10-CM | POA: Insufficient documentation

## 2022-02-10 DIAGNOSIS — K644 Residual hemorrhoidal skin tags: Secondary | ICD-10-CM | POA: Insufficient documentation

## 2022-02-10 DIAGNOSIS — R6881 Early satiety: Secondary | ICD-10-CM

## 2022-02-10 DIAGNOSIS — Z01818 Encounter for other preprocedural examination: Secondary | ICD-10-CM

## 2022-02-10 DIAGNOSIS — R69 Illness, unspecified: Secondary | ICD-10-CM | POA: Diagnosis not present

## 2022-02-10 HISTORY — PX: COLONOSCOPY WITH PROPOFOL: SHX5780

## 2022-02-10 HISTORY — PX: BIOPSY: SHX5522

## 2022-02-10 HISTORY — PX: ESOPHAGOGASTRODUODENOSCOPY (EGD) WITH PROPOFOL: SHX5813

## 2022-02-10 LAB — HM COLONOSCOPY

## 2022-02-10 SURGERY — COLONOSCOPY WITH PROPOFOL
Anesthesia: General

## 2022-02-10 MED ORDER — PROPOFOL 500 MG/50ML IV EMUL
INTRAVENOUS | Status: AC
  Filled 2022-02-10: qty 350

## 2022-02-10 MED ORDER — PROPOFOL 500 MG/50ML IV EMUL
INTRAVENOUS | Status: AC
  Filled 2022-02-10: qty 50

## 2022-02-10 MED ORDER — LIDOCAINE HCL (PF) 2 % IJ SOLN
INTRAMUSCULAR | Status: AC
  Filled 2022-02-10: qty 10

## 2022-02-10 MED ORDER — PHENYLEPHRINE 80 MCG/ML (10ML) SYRINGE FOR IV PUSH (FOR BLOOD PRESSURE SUPPORT)
PREFILLED_SYRINGE | INTRAVENOUS | Status: AC
  Filled 2022-02-10: qty 10

## 2022-02-10 MED ORDER — LACTATED RINGERS IV SOLN: INTRAVENOUS | Status: DC

## 2022-02-10 MED ORDER — LIDOCAINE 2% (20 MG/ML) 5 ML SYRINGE
INTRAMUSCULAR | Status: DC | PRN
  Administered 2022-02-10: 50 mg via INTRAVENOUS

## 2022-02-10 MED ORDER — PROPOFOL 10 MG/ML IV BOLUS
INTRAVENOUS | Status: DC | PRN
  Administered 2022-02-10: 30 mg via INTRAVENOUS

## 2022-02-10 MED ORDER — PROPOFOL 500 MG/50ML IV EMUL
INTRAVENOUS | Status: DC | PRN
  Administered 2022-02-10: 200 ug/kg/min via INTRAVENOUS

## 2022-02-10 NOTE — H&P (Signed)
Bethany King is an 51 y.o. female.   Chief Complaint: Abdominal pain and rectal bleeding HPI: 51 year old female with past medical history of IBS, hypertension, depression, GERD, costochondritis, psoriatic arthritis, interstitial cystitis, who comes to the hospital for evaluation of upper abdominal pain and rectal bleeding.  Patient states that despite taking Nexium twice a day she has not felt any improvement in her upper abdominal pain.  She states that this happens usually after having a meal and resolved on its own.  Also reports having frequent rectal bleeding when she has to strain to have a bowel movement if her stools are very hard for which she is taking high-fiber.  Past Medical History:  Diagnosis Date   Allergic rhinitis    Anxiety    Arthritis    Bladder pain    Complication of anesthesia    Costochondritis    hx   DDD (degenerative disc disease), lumbar    Depression    GERD (gastroesophageal reflux disease)    Hepatic hemangioma 2006   stable   History of adenomatous polyp of colon    History of colitis    History of gastritis    History of kidney stones    HTN (hypertension)    Irritable bowel syndrome (IBS)    Medullary sponge kidney    left   Migraine    Nephrolithiasis    bilateral-- nonobstructive per CT   PONV (postoperative nausea and vomiting)    Psoriatic arthritis (HCC)    Psoriatic arthritis ([place])    Spinal stenosis    Urgency of urination     Past Surgical History:  Procedure Laterality Date   ANTERIOR CERVICAL DECOMP/DISCECTOMY FUSION  02/26/2014   C5 - C6; fusion with plate   BIOPSY  4/94/4967   Procedure: BIOPSY;  Surgeon: Rogene Houston, MD;  [facility];  Service: Endoscopy;;  gastric polyps   CARPAL TUNNEL RELEASE Bilateral right 04-01-2010/  left 05-30-2010   CHOLECYSTECTOMY N/A 07/14/2013   Procedure: LAPAROSCOPIC CHOLECYSTECTOMY;  Surgeon: Jamesetta So, MD;  Location: [facility];  Service: General;  Laterality: N/A;    COLONOSCOPY  10-13-2010   COLONOSCOPY N/A 01/04/2019   Procedure: COLONOSCOPY;  Surgeon: Rogene Houston, MD;  [facility];  Service: Endoscopy;  Laterality: N/A;   CYSTO WITH HYDRODISTENSION N/A 04/02/2015   Procedure: CYSTOSCOPY/HYDRODISTENSION, BLADDER BIOPSY WITH FULGURATION;  Surgeon: Irine Seal, MD;  [facility];  Service: Urology;  Laterality: N/A;   CYSTO WITH HYDRODISTENSION N/A 03/25/2018   Procedure: CYSTOSCOPY/HYDRODISTENSION, INSTILL MARCAIN AND PYRIDIUM;  Surgeon: Irine Seal, MD;  Location: [facility];  Service: Urology;  Laterality: N/A;   CYSTO WITH HYDRODISTENSION N/A 01/15/2020   Procedure: CYSTOSCOPY/HYDRODISTENSION;  Surgeon: Cleon Gustin, MD;  Location: [facility];  Service: Urology;  Laterality: N/A;   CYSTOSCOPY W/ URETERAL STENT PLACEMENT Bilateral 04/08/2016   Procedure: CYSTOSCOPY WITH BILATERAL RETROGRADE PYELOGRAM, BILATERAL URETERAL STENT PLACEMENT;  Surgeon: Cleon Gustin, MD;  Location[facility];  Service: Urology;  Laterality: Bilateral;   CYSTOSCOPY W/ URETERAL STENT PLACEMENT Left 04/15/2016   Procedure: CYSTOSCOPY WITH RETROGRADE PYELOGRAM/URETERAL STENT PLACEMENT;  Surgeon: Cleon Gustin, MD;  [facility];  Service: Urology;  Laterality: Left;   CYSTOSCOPY WITH BIOPSY N/A 01/15/2020   Procedure: CYSTOSCOPY WITH BLADDER BIOPSY AND FULGERATION;  Surgeon: Cleon Gustin, MD;  [facility];  Service: Urology;  Laterality: N/A;   CYSTOSCOPY WITH HOLMIUM LASER LITHOTRIPSY Right 04/08/2016   Procedure: CYSTOSCOPY WITH  RIGHT RENAL STONE EXTRACTION WITH HOLMIUM LASER LITHOTRIPSY;  Surgeon: Cleon Gustin, MD;  [facility];  Service: Urology;  Laterality: Right;   CYSTOSCOPY WITH HOLMIUM LASER LITHOTRIPSY Left 04/15/2016   Procedure: CYSTOSCOPY WITH HOLMIUM LASER LITHOTRIPSY;  Surgeon: Cleon Gustin, MD;  [facility];  Service: Urology;  Laterality: Left;   CYSTOSCOPY WITH RETROGRADE PYELOGRAM,  URETEROSCOPY AND STENT PLACEMENT Left 07/05/2017   Procedure: CYSTOSCOPY WITH RETROGRADE PYELOGRAM, URETEROSCOPY AND STENT PLACEMENT;  Surgeon: Cleon Gustin, MD;  Location[facility];  Service: Urology;  Laterality: Left;   DIAGNOSTIC LAPAROSCOPY     DILATION AND CURETTAGE OF UTERUS     DILITATION & CURRETTAGE/HYSTROSCOPY WITH THERMACHOICE ABLATION N/A 09/07/2012   Procedure: DILATATION & CURETTAGE/HYSTEROSCOPY WITH THERMACHOICE ABLATION;  Surgeon: Florian Buff, MD;  Location: [facility];  Service: Gynecology;  Laterality: N/A;   ESOPHAGOGASTRODUODENOSCOPY  10/14/10   ESOPHAGOGASTRODUODENOSCOPY N/A 01/04/2019   Procedure: ESOPHAGOGASTRODUODENOSCOPY (EGD);  Surgeon: Rogene Houston, MD;  [facility];  Service: Endoscopy;  Laterality: N/A;  9:30   EXTRACORPOREAL SHOCK WAVE LITHOTRIPSY  multiple   HOLMIUM LASER APPLICATION Left 09/11/5186   Procedure: HOLMIUM LASER APPLICATION;  Surgeon: Cleon Gustin, MD;  Location[facility];  Service: Urology;  Laterality: Left;   PERCUTANEOUS NEPHROSTOLITHOTOMY  2003   PLANTAR FASCIA SURGERY Bilateral right 2008//  left 2010   POLYPECTOMY  01/04/2019   Procedure: POLYPECTOMY;  Surgeon: Rogene Houston, MD;  [facility];  Service: Endoscopy;;  colon   STONE EXTRACTION WITH BASKET Left 04/15/2016   Procedure: STONE EXTRACTION WITH BASKET;  Surgeon: Cleon Gustin, MD;  Location[facility];  Service: Urology;  Laterality: Left;    Family History  Problem Relation Age of Onset   Irritable bowel syndrome Mother    Scleroderma Mother    Rheum arthritis Mother    Asthma Mother    Melanoma Father    Cancer Father        Melanoma   Colon cancer Cousin 82       second cousin Sunday Spillers Lovelace)   Cancer Maternal Grandmother        Ovarian   Diabetes Maternal Grandmother    Thyroid disease Maternal Aunt    Social History:  reports that she has never smoked. She has never been exposed to tobacco smoke. She has never used smokeless  tobacco. She reports that she does not drink alcohol and does not use drugs.  Allergies:  Allergies  Allergen Reactions   Hydromorphone Itching   Prednisone Other (See Comments)    Increase anxiety symptoms, insomnia   Tramadol Other (See Comments)    Causes dizziness    Sulfa Antibiotics Rash    Medications Prior to Admission  Medication Sig Dispense Refill   ALPRAZolam (XANAX) 0.5 MG tablet Take 0.5 mg by mouth 2 (two) times daily as needed for anxiety.     aluminum hydroxide-magnesium carbonate (GAVISCON) 95-358 MG/15ML SUSP Take 15 mLs by mouth at bedtime.     Cholecalciferol (VITAMIN D3) 125 MCG (5000 UT) CAPS Take 5,000 Units by mouth daily.     DULoxetine (CYMBALTA) 60 MG capsule Take 60 mg by mouth daily.     esomeprazole (NEXIUM) 20 MG capsule Take 40 mg by mouth every morning.     estradiol (ESTRACE) 1 MG tablet TAKE 1 TABLET(1 MG) BY MOUTH DAILY 30 tablet 12   fluticasone (FLONASE) 50 MCG/ACT nasal spray Place 1 spray into both nostrils daily as needed for  allergies or rhinitis.     HYDROcodone-acetaminophen (NORCO) 10-325 MG tablet Take 1 tablet by mouth 4 (four) times daily as needed for moderate pain. (Patient taking differently: Take 1-2 tablets by mouth every 6 (six) hours as needed for moderate pain.) 30 tablet 0   hydrOXYzine (ATARAX) 10 MG tablet Take 1 tablet (10 mg total) by mouth at bedtime. 30 tablet 11   pentosan polysulfate (ELMIRON) 100 MG capsule TAKE 1 CAPSULE(100 MG) BY MOUTH THREE TIMES DAILY (Patient taking differently: Take 100 mg by mouth 2 (two) times daily.) 90 capsule 3   potassium chloride SA (K-DUR,KLOR-CON) 20 MEQ tablet Take 20 mEq by mouth 2 (two) times daily.     progesterone (PROMETRIUM) 200 MG capsule TAKE 1 CAPSULE(200 MG) BY MOUTH AT BEDTIME 30 capsule 12   triamterene-hydrochlorothiazide (DYAZIDE) 37.5-25 MG capsule Take 1 capsule by mouth daily.   11   TREMFYA 100 MG/ML SOSY Inject 100 mg into the skin every 8 (eight) weeks.      No  results found for this or any previous visit (from the past 48 hour(s)). No results found.  Review of Systems  Constitutional: Negative.   HENT: Negative.    Eyes: Negative.   Respiratory: Negative.    Cardiovascular: Negative.   Gastrointestinal:  Positive for abdominal pain and blood in stool.  Endocrine: Negative.   Genitourinary: Negative.   Musculoskeletal: Negative.   Skin: Negative.   Allergic/Immunologic: Negative.   Neurological: Negative.   Hematological: Negative.   Psychiatric/Behavioral: Negative.      Blood pressure 119/70, pulse 67, temperature 98.5 F (36.9 C), temperature source Oral, resp. rate 19, height 5' 2.5" (1.588 m), weight 79.4 kg, SpO2 99 %. Physical Exam  GENERAL: The patient is AO x3, in no acute distress. HEENT: Head is normocephalic and atraumatic. EOMI are intact. Mouth is well hydrated and without lesions. NECK: Supple. No masses LUNGS: Clear to auscultation. No presence of rhonchi/wheezing/rales. Adequate chest expansion HEART: RRR, normal s1 and s2. ABDOMEN: Soft, nontender, no guarding, no peritoneal signs, and nondistended. BS +. No masses. EXTREMITIES: Without any cyanosis, clubbing, rash, lesions or edema. NEUROLOGIC: AOx3, no focal motor deficit. SKIN: no jaundice, no rashes  Assessment/Plan  51 year old female with past medical history of IBS, hypertension, depression, GERD, costochondritis, psoriatic arthritis, interstitial cystitis, who comes to the hospital for evaluation of upper abdominal pain and rectal bleeding.  We will proceed with EGD and colonoscopy.  Harvel Quale, MD 02/10/2022, 9:19 AM

## 2022-02-10 NOTE — Op Note (Addendum)
[facility] Patient Name: Bethany King Procedure Date: 02/10/2022 9:08 AM MRN: [id number] Date of Birth: 1971/02/11 Attending MD: Katrinka Blazing ,  CSN: [id number] Age: 51 Admit Type: Outpatient Procedure:                Upper GI endoscopy Indications:              Epigastric abdominal pain Providers:                Katrinka Blazing, Edrick Kins, RN, Crystal Page Referring MD:              Medicines:                Monitored Anesthesia Care Complications:            No immediate complications. Estimated Blood Loss:     Estimated blood loss: none. Procedure:                Pre-Anesthesia Assessment:                           - Prior to the procedure, a History and Physical                            was performed, and patient medications, allergies                            and sensitivities were reviewed. The patient's                            tolerance of previous anesthesia was reviewed.                           - The risks and benefits of the procedure and the                            sedation options and risks were discussed with the                            patient. All questions were answered and informed                            consent was obtained.                           - ASA Grade Assessment: II - A patient with mild                            systemic disease.                           After obtaining informed consent, the endoscope was                            passed under direct vision. Throughout the                            procedure, the patient's blood pressure,  pulse, and                            oxygen saturations were monitored continuously. The                            [id number]-H190 ([id number]) scope was introduced through the                            mouth, and advanced to the second part of duodenum.                            The upper GI endoscopy was accomplished without                            difficulty. The patient tolerated  the procedure                            well. Scope In: 9:26:11 AM Scope Out: 9:30:50 AM Total Procedure Duration: 0 hours 4 minutes 39 seconds  Findings:      The examined esophagus was normal.      The Z-line was regular and was found 42 cm from the incisors.      The gastroesophageal flap valve was visualized endoscopically and       classified as Hill Grade II (fold present, opens with respiration).      A few pedunculated and sessile fundic gland polyps with no bleeding and       no stigmata of recent bleeding were found in the gastric fundus.      The rest of the examined stomach was normal. Biopsies were taken with a       cold forceps for Helicobacter pylori testing.      The examined duodenum was normal. Impression:               - Normal esophagus.                           - Z-line regular, 42 cm from the incisors.                           - A few fundic gland polyps.                           - Normal stomach otherwise. Biopsied.                           - Normal examined duodenum. Moderate Sedation:      Per Anesthesia Care Recommendation:           - Discharge patient to home (ambulatory).                           - Resume previous diet.                           - Await pathology results.                           -  Continue present medications.                           - Schedule gastric emptying study only if able to                            come off opiates for 1 week. Will discuss in clinic Procedure Code(s):        --- Professional ---                           [phone], Esophagogastroduodenoscopy, flexible,                            transoral; with biopsy, single or multiple Diagnosis Code(s):        --- Professional ---                           K31.7, Polyp of stomach and duodenum                           R10.13, Epigastric pain CPT copyright 2019 American [employer] Association. All rights reserved. The codes documented in this report are preliminary and  upon coder review may  be revised to meet current compliance requirements. Katrinka Blazing, MD Katrinka Blazing,  02/10/2022 9:35:27 AM This report has been signed electronically. Number of Addenda: 0

## 2022-02-10 NOTE — Transfer of Care (Signed)
Immediate Anesthesia Transfer of Care Note  Patient: Bethany King  Procedure(s) Performed: COLONOSCOPY WITH PROPOFOL ESOPHAGOGASTRODUODENOSCOPY (EGD) WITH PROPOFOL BIOPSY  Patient Location: Endoscopy Unit  Anesthesia Type:MAC  Level of Consciousness: awake, alert  and oriented  Airway & Oxygen Therapy: Patient Spontanous Breathing and Patient connected to face mask oxygen  Post-op Assessment: Report given to RN and Post -op Vital signs reviewed and stable  Post vital signs: Reviewed and stable  Last Vitals:  Vitals Value Taken Time  BP 125/74   Temp    Pulse 71   Resp 14   SpO2 100%     Last Pain:  Vitals:   02/10/22 0922  TempSrc:   PainSc: 5       Patients Stated Pain Goal: 6 (52/08/02 2336)  Complications: No notable events documented.

## 2022-02-10 NOTE — Discharge Instructions (Addendum)
You are being discharged to home.  We are waiting for your pathology results.  Continue your present medications.  Schedule gastric emptying study Eat a high fiber diet.  Your physician has recommended a repeat colonoscopy in five years for screening purposes.

## 2022-02-10 NOTE — Anesthesia Preprocedure Evaluation (Signed)
Anesthesia Evaluation  Patient identified by MRN, date of birth, ID band Patient awake    Reviewed: Allergy & Precautions, H&P , NPO status , Patient's Chart, lab work & pertinent test results, reviewed documented beta blocker date and time   History of Anesthesia Complications (+) PONV and history of anesthetic complications  Airway Mallampati: II  TM Distance: >3 FB Neck ROM: full    Dental no notable dental hx.    Pulmonary neg pulmonary ROS,    Pulmonary exam normal breath sounds clear to auscultation       Cardiovascular Exercise Tolerance: Good hypertension, negative cardio ROS   Rhythm:regular Rate:Normal     Neuro/Psych  Headaches, PSYCHIATRIC DISORDERS Anxiety Depression  Neuromuscular disease    GI/Hepatic Neg liver ROS, GERD  Medicated,  Endo/Other  negative endocrine ROS  Renal/GU negative Renal ROS  negative genitourinary   Musculoskeletal   Abdominal   Peds  Hematology negative hematology ROS (+)   Anesthesia Other Findings   Reproductive/Obstetrics negative OB ROS                             Anesthesia Physical Anesthesia Plan  ASA: 2  Anesthesia Plan: General   Post-op Pain Management:    Induction:   PONV Risk Score and Plan: Propofol infusion  Airway Management Planned:   Additional Equipment:   Intra-op Plan:   Post-operative Plan:   Informed Consent: I have reviewed the patients History and Physical, chart, labs and discussed the procedure including the risks, benefits and alternatives for the proposed anesthesia with the patient or authorized representative who has indicated his/her understanding and acceptance.     Dental Advisory Given  Plan Discussed with: CRNA  Anesthesia Plan Comments:         Anesthesia Quick Evaluation

## 2022-02-10 NOTE — Op Note (Signed)
[facility] Patient Name: Bethany King Procedure Date: 02/10/2022 9:08 AM MRN: [id number] Date of Birth: 11-21-1970 Attending MD: Katrinka Blazing ,  CSN: [id number] Age: 51 Admit Type: Outpatient Procedure:                Colonoscopy Indications:              Rectal bleeding Providers:                Katrinka Blazing, Edrick Kins, RN, Crystal Page Referring MD:              Medicines:                Monitored Anesthesia Care Complications:            No immediate complications. Estimated Blood Loss:     Estimated blood loss: none. Procedure:                Pre-Anesthesia Assessment:                           - Prior to the procedure, a History and Physical                            was performed, and patient medications, allergies                            and sensitivities were reviewed. The patient's                            tolerance of previous anesthesia was reviewed.                           - The risks and benefits of the procedure and the                            sedation options and risks were discussed with the                            patient. All questions were answered and informed                            consent was obtained.                           - ASA Grade Assessment: II - A patient with mild                            systemic disease.                           After obtaining informed consent, the colonoscope                            was passed under direct vision. Throughout the                            procedure, the patient's blood pressure, pulse, and  oxygen saturations were monitored continuously. The                            [id number]-[id number] ([id number]) scope was introduced through                            the anus and advanced to the the terminal ileum.                            The colonoscopy was performed without difficulty.                            The patient tolerated the procedure well. The                             quality of the bowel preparation was good. Scope In: 9:36:51 AM Scope Out: 9:57:44 AM Scope Withdrawal Time: 0 hours 16 minutes 39 seconds  Total Procedure Duration: 0 hours 20 minutes 53 seconds  Findings:      Hemorrhoids were found on perianal exam.      The terminal ileum appeared normal.      The colon (entire examined portion) appeared normal.      Non-bleeding external and internal hemorrhoids were found during       retroflexion and during perianal exam. The hemorrhoids were small. Impression:               - Hemorrhoids found on perianal exam.                           - The examined portion of the ileum was normal.                           - The entire examined colon is normal.                           - Non-bleeding external and internal hemorrhoids.                           - No specimens collected. Moderate Sedation:      Per Anesthesia Care Recommendation:           - Discharge patient to home (ambulatory).                           - High fiber diet.                           - Repeat colonoscopy in 5 years for screening                            purposes. Procedure Code(s):        --- Professional ---                           [phone], LT, Colonoscopy, flexible; diagnostic,  including collection of specimen(s) by brushing or                            washing, when performed (separate procedure) Diagnosis Code(s):        --- Professional ---                           K64.8, Other hemorrhoids                           K62.5, Hemorrhage of anus and rectum CPT copyright 2019 [employer]. All rights reserved. The codes documented in this report are preliminary and upon coder review may  be revised to meet current compliance requirements. Katrinka Blazing, MD Katrinka Blazing,  02/10/2022 10:06:08 AM This report has been signed electronically. Number of Addenda: 0

## 2022-02-11 ENCOUNTER — Encounter (INDEPENDENT_AMBULATORY_CARE_PROVIDER_SITE_OTHER): Payer: Self-pay | Admitting: *Deleted

## 2022-02-11 LAB — SURGICAL PATHOLOGY

## 2022-02-12 NOTE — Anesthesia Postprocedure Evaluation (Signed)
Anesthesia Post Note  Patient: Bethany King  Procedure(s) Performed: COLONOSCOPY WITH PROPOFOL ESOPHAGOGASTRODUODENOSCOPY (EGD) WITH PROPOFOL BIOPSY  Patient location during evaluation: Phase II Anesthesia Type: General Level of consciousness: awake Pain management: pain level controlled Vital Signs Assessment: post-procedure vital signs reviewed and stable Respiratory status: spontaneous breathing and respiratory function stable Cardiovascular status: blood pressure returned to baseline and stable Postop Assessment: no headache and no apparent nausea or vomiting Anesthetic complications: no Comments: Late entry   No notable events documented.   Last Vitals:  Vitals:   02/10/22 0819 02/10/22 1003  BP: 119/70 125/73  Pulse:  68  Resp:  17  Temp:  (!) 36.4 C  SpO2:  100%    Last Pain:  Vitals:   02/10/22 1003  TempSrc: Oral  PainSc: 0-No pain                 Louann Sjogren

## 2022-02-13 ENCOUNTER — Encounter (HOSPITAL_COMMUNITY): Payer: Self-pay | Admitting: Gastroenterology

## 2022-02-17 DIAGNOSIS — I10 Essential (primary) hypertension: Secondary | ICD-10-CM | POA: Diagnosis not present

## 2022-02-17 DIAGNOSIS — T50905A Adverse effect of unspecified drugs, medicaments and biological substances, initial encounter: Secondary | ICD-10-CM | POA: Diagnosis not present

## 2022-02-17 DIAGNOSIS — E876 Hypokalemia: Secondary | ICD-10-CM | POA: Diagnosis not present

## 2022-02-23 ENCOUNTER — Ambulatory Visit (INDEPENDENT_AMBULATORY_CARE_PROVIDER_SITE_OTHER): Payer: Medicare HMO | Admitting: Gastroenterology

## 2022-02-26 ENCOUNTER — Other Ambulatory Visit: Payer: Self-pay

## 2022-02-26 ENCOUNTER — Ambulatory Visit: Payer: Self-pay

## 2022-02-26 ENCOUNTER — Encounter: Payer: Self-pay | Admitting: Emergency Medicine

## 2022-02-26 ENCOUNTER — Ambulatory Visit
Admission: EM | Admit: 2022-02-26 | Discharge: 2022-02-26 | Disposition: A | Discharge status: Home / Self Care | Payer: Medicare HMO

## 2022-02-26 DIAGNOSIS — J0100 Acute maxillary sinusitis, unspecified: Secondary | ICD-10-CM | POA: Diagnosis not present

## 2022-02-26 MED ORDER — AMOXICILLIN-POT CLAVULANATE 875-125 MG PO TABS: 1.0000 | ORAL_TABLET | Freq: Two times a day (BID) | ORAL | 0 refills | Status: DC

## 2022-02-26 NOTE — Discharge Instructions (Signed)
You may take Coricidin HBP, Flonase nasal spray, antihistamines, plain Mucinex (not Mucinex DM) and use sinus rinses anytime you are sick.  These medications are blood pressure safe

## 2022-02-26 NOTE — ED Provider Notes (Signed)
[facility]    CSN: [id number] Arrival date & time: 02/26/22  1557      History   Chief Complaint Chief Complaint  Patient presents with   Cough    HPI Tamieka A Odonohue is a 51 y.o. female.   Patient presenting today with several week history of progressively worsening sinus congestion, pressure, sinus headache, low-grade fevers, fatigue, chills, sweats.  States initially had cold-like symptoms but now mainly of the sinus issues and the low-grade fevers.  Symptoms have been progressively worsening over the last several days.  States she is having to take Tylenol around-the-clock to keep control of her symptoms and has been taking Flonase nasal spray as well.  Denies known past pertinent chronic medical problems.    Past Medical History:  Diagnosis Date   Allergic rhinitis    Anxiety    Arthritis    Bladder pain    Complication of anesthesia    Costochondritis    hx   DDD (degenerative disc disease), lumbar    Depression    GERD (gastroesophageal reflux disease)    Hepatic hemangioma 2006   stable   History of adenomatous polyp of colon    History of colitis    History of gastritis    History of kidney stones    HTN (hypertension)    Irritable bowel syndrome (IBS)    Medullary sponge kidney    left   Migraine    Nephrolithiasis    bilateral-- nonobstructive per CT   PONV (postoperative nausea and vomiting)    Psoriatic arthritis (HCC)    Psoriatic arthritis ([place])    Spinal stenosis    Urgency of urination     Patient Active Problem List   Diagnosis Date Noted   Early satiety 01/06/2022   Hematochezia 01/06/2022   Bloating 01/06/2022   Hemorrhoids 12/09/2021   Rectal itching 12/09/2021   Overactive bladder 09/09/2020   Encounter for screening fecal occult blood testing 04/09/2020   Encounter for well woman exam with routine gynecological exam 04/09/2020   Hormone replacement therapy (HRT) 04/09/2020   Pelvic pain in female 04/05/2020    Urinary urgency 04/05/2020   Vaginal dryness 02/27/2020   Burning with urination 02/27/2020   Vaginal burning 02/27/2020   Chronic interstitial cystitis 02/09/2020   Menopause 04/04/2019   Screening for colorectal cancer 04/04/2019   Encounter for gynecological examination with Papanicolaou smear of cervix 04/04/2019   Routine cervical smear 04/04/2019   Vaginal dryness, menopausal 04/04/2019   Hot flashes 04/04/2019   Psoriatic arthritis ([place]) 12/26/2018   History of colonic polyps 12/26/2018   Pain of upper abdomen 12/26/2018   Abdominal pain 06/23/2014   Acute gastroenteritis 06/23/2014   Back pain with radiation 10/24/2012   Breast mass 10/09/2012   Seasonal allergies 09/22/2012   Hypokalemia 09/22/2012   Dysmenorrhea 08/30/2012   Sciatica 05/29/2012   Sinusitis 03/02/2012   Back pain 02/10/2012   Menorrhagia 12/30/2011   Shoulder pain, left 12/03/2011   Costochondritis 11/17/2011   Shoulder bursitis 11/17/2011   Recurrent UTI 10/09/2011   Migraine headache 10/08/2011   Obesity 09/04/2011   HTN (hypertension) 07/14/2011   Anxiety 07/14/2011   Diarrhea 05/19/2011   Chest pain 04/30/2011   IBS (irritable bowel syndrome) 09/23/2010   Epigastric pain 09/23/2010   CONSTIPATION 01/29/2010   GERD 01/24/2010    Past Surgical History:  Procedure Laterality Date   ANTERIOR CERVICAL DECOMP/DISCECTOMY FUSION  02/26/2014   C5 - C6; fusion with plate   BIOPSY  01/04/2019   Procedure: BIOPSY;  Surgeon: Rogene Houston, MD;  [facility];  Service: Endoscopy;;  gastric polyps   BIOPSY  02/10/2022   Procedure: BIOPSY;  Surgeon: Harvel Quale, MD;  [facility]: [facility];  Service: Gastroenterology;;   CARPAL TUNNEL RELEASE Bilateral right 04-01-2010/  left 05-30-2010   CHOLECYSTECTOMY N/A 07/14/2013   Procedure: LAPAROSCOPIC CHOLECYSTECTOMY;  Surgeon: Jamesetta So, MD;  Location: [facility];  Service: General;  Laterality: N/A;   COLONOSCOPY  10-13-2010    COLONOSCOPY N/A 01/04/2019   Procedure: COLONOSCOPY;  Surgeon: Rogene Houston, MD;  [facility];  Service: Endoscopy;  Laterality: N/A;   COLONOSCOPY WITH PROPOFOL N/A 02/10/2022   Procedure: COLONOSCOPY WITH PROPOFOL;  Surgeon: Harvel Quale, MD;  [facility]: [facility];  Service: Gastroenterology;  Laterality: N/A;  915 ASA 3   CYSTO WITH HYDRODISTENSION N/A 04/02/2015   Procedure: CYSTOSCOPY/HYDRODISTENSION, BLADDER BIOPSY WITH FULGURATION;  Surgeon: Irine Seal, MD;  Location[facility];  Service: Urology;  Laterality: N/A;   CYSTO WITH HYDRODISTENSION N/A 03/25/2018   Procedure: CYSTOSCOPY/HYDRODISTENSION, INSTILL MARCAIN AND PYRIDIUM;  Surgeon: Irine Seal, MD;  Location: [facility];  Service: Urology;  Laterality: N/A;   CYSTO WITH HYDRODISTENSION N/A 01/15/2020   Procedure: CYSTOSCOPY/HYDRODISTENSION;  Surgeon: Cleon Gustin, MD;  Location: [facility];  Service: Urology;  Laterality: N/A;   CYSTOSCOPY W/ URETERAL STENT PLACEMENT Bilateral 04/08/2016   Procedure: CYSTOSCOPY WITH BILATERAL RETROGRADE PYELOGRAM, BILATERAL URETERAL STENT PLACEMENT;  Surgeon: Cleon Gustin, MD;  Location: [facility];  Service: Urology;  Laterality: Bilateral;   CYSTOSCOPY W/ URETERAL STENT PLACEMENT Left 04/15/2016   Procedure: CYSTOSCOPY WITH RETROGRADE PYELOGRAM/URETERAL STENT PLACEMENT;  Surgeon: Cleon Gustin, MD;  Location: [facility];  Service: Urology;  Laterality: Left;   CYSTOSCOPY WITH BIOPSY N/A 01/15/2020   Procedure: CYSTOSCOPY WITH BLADDER BIOPSY AND FULGERATION;  Surgeon: Cleon Gustin, MD;  Location: [facility];  Service: Urology;  Laterality: N/A;   CYSTOSCOPY WITH HOLMIUM LASER LITHOTRIPSY Right 04/08/2016   Procedure: CYSTOSCOPY WITH RIGHT RENAL STONE EXTRACTION WITH HOLMIUM LASER LITHOTRIPSY;  Surgeon: Cleon Gustin, MD;  Location: [facility];  Service: Urology;  Laterality: Right;   CYSTOSCOPY WITH HOLMIUM LASER LITHOTRIPSY Left 04/15/2016   Procedure:  CYSTOSCOPY WITH HOLMIUM LASER LITHOTRIPSY;  Surgeon: Cleon Gustin, MD;  Location: [facility];  Service: Urology;  Laterality: Left;   CYSTOSCOPY WITH RETROGRADE PYELOGRAM, URETEROSCOPY AND STENT PLACEMENT Left 07/05/2017   Procedure: CYSTOSCOPY WITH RETROGRADE PYELOGRAM, URETEROSCOPY AND STENT PLACEMENT;  Surgeon: Cleon Gustin, MD;  Location: [facility];  Service: Urology;  Laterality: Left;   DIAGNOSTIC LAPAROSCOPY     DILATION AND CURETTAGE OF UTERUS     DILITATION & CURRETTAGE/HYSTROSCOPY WITH THERMACHOICE ABLATION N/A 09/07/2012   Procedure: DILATATION & CURETTAGE/HYSTEROSCOPY WITH THERMACHOICE ABLATION;  Surgeon: Florian Buff, MD;  Location: [facility];  Service: Gynecology;  Laterality: N/A;   ESOPHAGOGASTRODUODENOSCOPY  10/14/10   ESOPHAGOGASTRODUODENOSCOPY N/A 01/04/2019   Procedure: ESOPHAGOGASTRODUODENOSCOPY (EGD);  Surgeon: Rogene Houston, MD;  [facility];  Service: Endoscopy;  Laterality: N/A;  9:30   ESOPHAGOGASTRODUODENOSCOPY (EGD) WITH PROPOFOL N/A 02/10/2022   Procedure: ESOPHAGOGASTRODUODENOSCOPY (EGD) WITH PROPOFOL;  Surgeon: Harvel Quale, MD;  [facility];  Service: Gastroenterology;  Laterality: N/A;   EXTRACORPOREAL SHOCK WAVE LITHOTRIPSY  multiple   HOLMIUM LASER APPLICATION Left 2/54/2706   Procedure: HOLMIUM LASER APPLICATION;  Surgeon: Cleon Gustin, MD;  Location: [facility];  Service:  Urology;  Laterality: Left;   PERCUTANEOUS NEPHROSTOLITHOTOMY  2003   PLANTAR FASCIA SURGERY Bilateral right 2008//  left 2010   POLYPECTOMY  01/04/2019   Procedure: POLYPECTOMY;  Surgeon: Rogene Houston, MD;  [facility];  Service: Endoscopy;;  colon   STONE EXTRACTION WITH BASKET Left 04/15/2016   Procedure: STONE EXTRACTION WITH BASKET;  Surgeon: Cleon Gustin, MD;  Location: [facility];  Service: Urology;  Laterality: Left;    OB History     Gravida  0   Para      Term      Preterm      AB      Living  0      SAB       IAB      Ectopic      Multiple      Live Births               Home Medications    Prior to Admission medications   Medication Sig Start Date End Date Taking? Authorizing Provider  amoxicillin-clavulanate (AUGMENTIN) 875-125 MG tablet Take 1 tablet by mouth every 12 (twelve) hours. 02/26/22  Yes Volney American, PA-C  enalapril (VASOTEC) 10 MG tablet Take 10 mg by mouth daily.   Yes [provider]  HYDROcodone-acetaminophen (NORCO) 10-325 MG tablet Take 1 tablet by mouth 4 (four) times daily as needed for moderate pain. Patient taking differently: Take 1-2 tablets by mouth every 6 (six) hours as needed for moderate pain. 01/15/20  Yes McKenzie, Candee Furbish, MD  hydrOXYzine (ATARAX) 10 MG tablet Take 1 tablet (10 mg total) by mouth at bedtime. 10/01/21  Yes McKenzie, Candee Furbish, MD  pentosan polysulfate (ELMIRON) 100 MG capsule TAKE 1 CAPSULE(100 MG) BY MOUTH THREE TIMES DAILY Patient taking differently: Take 100 mg by mouth 2 (two) times daily. 10/01/21  Yes McKenzie, Candee Furbish, MD  ALPRAZolam Duanne Moron) 0.5 MG tablet Take 0.5 mg by mouth 2 (two) times daily as needed for anxiety. 11/10/19   [provider]  aluminum hydroxide-magnesium carbonate (GAVISCON) 95-358 MG/15ML SUSP Take 15 mLs by mouth at bedtime.    [provider]  Cholecalciferol (VITAMIN D3) 125 MCG (5000 UT) CAPS Take 5,000 Units by mouth daily.    [provider]  DULoxetine (CYMBALTA) 60 MG capsule Take 60 mg by mouth daily.    [provider]  esomeprazole (NEXIUM) 20 MG capsule Take 40 mg by mouth every morning.    [provider]  estradiol (ESTRACE) 1 MG tablet TAKE 1 TABLET(1 MG) BY MOUTH DAILY 04/10/21   Estill Dooms, NP  fluticasone (FLONASE) 50 MCG/ACT nasal spray Place 1 spray into both nostrils daily as needed for allergies or rhinitis.    [provider]  potassium chloride SA (K-DUR,KLOR-CON) 20 MEQ tablet Take 20 mEq by mouth 2 (two)  times daily.    [provider]  progesterone (PROMETRIUM) 200 MG capsule TAKE 1 CAPSULE(200 MG) BY MOUTH AT BEDTIME 04/10/21   Derrek Monaco A, NP  TREMFYA 100 MG/ML SOSY Inject 100 mg into the skin every 8 (eight) weeks. 02/17/21   [provider]  triamterene-hydrochlorothiazide (DYAZIDE) 37.5-25 MG capsule Take 1 capsule by mouth daily.  01/09/18   [provider]  Adalimumab (HUMIRA PEN) 40 MG/0.4ML PNKT Inject 40 mg into the skin every 14 (fourteen) days. Twice a month   08/09/19  [provider]  pantoprazole (PROTONIX) 40 MG tablet Take 1 tablet (40 mg total)  by mouth daily before breakfast. 01/04/19 08/09/19  Rogene Houston, MD    Family History Family History  Problem Relation Age of Onset   Irritable bowel syndrome Mother    Scleroderma Mother    Rheum arthritis Mother    Asthma Mother    Melanoma Father    Cancer Father        Melanoma   Colon cancer Cousin 40       second cousin Sunday Spillers Lovelace)   Cancer Maternal Grandmother        Ovarian   Diabetes Maternal Grandmother    Thyroid disease Maternal Aunt     Social History Social History   Tobacco Use   Smoking status: Never    Passive exposure: Never   Smokeless tobacco: Never  Vaping Use   Vaping Use: Never used  Substance Use Topics   Alcohol use: No   Drug use: No     Allergies   Hydromorphone, Prednisone, Tramadol, and Sulfa antibiotics   Review of Systems Review of Systems Per HPI  Physical Exam Triage Vital Signs ED Triage Vitals [02/26/22 1743]  Enc Vitals Group     BP (!) 163/78     Pulse Rate 68     Resp 18     Temp 98.9 F (37.2 C)     Temp Source Oral     SpO2 98 %     Weight      Height      Head Circumference      Peak Flow      Pain Score 6     Pain Loc      Pain Edu?      Excl. in [place]?    No data found.  Updated Vital Signs BP (!) 163/78 (BP Location: Right Arm)   Pulse 68   Temp 98.9 F (37.2 C) (Oral)   Resp 18   SpO2 98%    Visual Acuity Right Eye Distance:   Left Eye Distance:   Bilateral Distance:    Right Eye Near:   Left Eye Near:    Bilateral Near:     Physical Exam Vitals and nursing note reviewed.  Constitutional:      Appearance: Normal appearance.  HENT:     Head: Atraumatic.     Right Ear: Tympanic membrane and external ear normal.     Left Ear: Tympanic membrane and external ear normal.     Nose: Congestion present.     Comments: Bilateral maxillary sinuses tender to palpation    Mouth/Throat:     Mouth: Mucous membranes are moist.     Pharynx: Posterior oropharyngeal erythema present.  Eyes:     Extraocular Movements: Extraocular movements intact.     Conjunctiva/sclera: Conjunctivae normal.  Cardiovascular:     Rate and Rhythm: Normal rate and regular rhythm.     Heart sounds: Normal heart sounds.  Pulmonary:     Effort: Pulmonary effort is normal.     Breath sounds: Normal breath sounds. No wheezing.  Musculoskeletal:        General: Normal range of motion.     Cervical back: Normal range of motion and neck supple.  Skin:    General: Skin is warm and dry.  Neurological:     Mental Status: She is alert and oriented to person, place, and time.  Psychiatric:        Mood and Affect: Mood normal.        Thought Content: Thought content normal.  UC Treatments / Results  Labs (all labs ordered are listed, but only abnormal results are displayed) Labs Reviewed - No data to display  EKG   Radiology No results found.  Procedures Procedures (including critical care time)  Medications Ordered in UC Medications - No data to display  Initial Impression / Assessment and Plan / UC Course  I have reviewed the triage vital signs and the nursing notes.  Pertinent labs & imaging results that were available during my care of the patient were reviewed by me and considered in my medical decision making (see chart for details).     Treat for sinusitis with Augmentin,  Flonase, Mucinex, sinus rinses.  Ibuprofen and Tylenol as needed.  Return for worsening symptoms.  Final Clinical Impressions(s) / UC Diagnoses   Final diagnoses:  Acute non-recurrent maxillary sinusitis     Discharge Instructions      You may take Coricidin HBP, Flonase nasal spray, antihistamines, plain Mucinex (not Mucinex DM) and use sinus rinses anytime you are sick.  These medications are blood pressure safe    ED Prescriptions     Medication Sig Dispense Auth. Provider   amoxicillin-clavulanate (AUGMENTIN) 875-125 MG tablet Take 1 tablet by mouth every 12 (twelve) hours. 14 tablet Volney American, [place]      PDMP not reviewed this encounter.   Volney American, [place] 02/26/22 1814

## 2022-02-26 NOTE — ED Triage Notes (Signed)
Pt reports cough, head congestion, fever, nausea for last several weeks. Home covid test was negative. Pt reports some symptoms have improved but facial pressure and fever remains. Pt reports has been taking tylenol every four hours with last dose approx 1445.

## 2022-03-02 DIAGNOSIS — R69 Illness, unspecified: Secondary | ICD-10-CM | POA: Diagnosis not present

## 2022-03-02 DIAGNOSIS — M5010 Cervical disc disorder with radiculopathy, unspecified cervical region: Secondary | ICD-10-CM | POA: Diagnosis not present

## 2022-03-02 DIAGNOSIS — I10 Essential (primary) hypertension: Secondary | ICD-10-CM | POA: Diagnosis not present

## 2022-03-02 DIAGNOSIS — E876 Hypokalemia: Secondary | ICD-10-CM | POA: Diagnosis not present

## 2022-03-02 DIAGNOSIS — Z6830 Body mass index (BMI) 30.0-30.9, adult: Secondary | ICD-10-CM | POA: Diagnosis not present

## 2022-03-02 DIAGNOSIS — T50905A Adverse effect of unspecified drugs, medicaments and biological substances, initial encounter: Secondary | ICD-10-CM | POA: Diagnosis not present

## 2022-03-09 DIAGNOSIS — E876 Hypokalemia: Secondary | ICD-10-CM | POA: Diagnosis not present

## 2022-03-20 ENCOUNTER — Other Ambulatory Visit: Payer: Self-pay | Admitting: Adult Health

## 2022-03-30 DIAGNOSIS — Z23 Encounter for immunization: Secondary | ICD-10-CM | POA: Diagnosis not present

## 2022-04-06 ENCOUNTER — Encounter: Payer: Self-pay | Admitting: Urology

## 2022-04-06 ENCOUNTER — Ambulatory Visit (HOSPITAL_COMMUNITY)
Admission: RE | Admit: 2022-04-06 | Discharge: 2022-04-06 | Disposition: A | Discharge status: Home / Self Care | Payer: Medicare HMO | Source: Ambulatory Visit | Attending: Urology | Admitting: Urology

## 2022-04-06 ENCOUNTER — Ambulatory Visit: Payer: Medicare HMO | Admitting: Urology

## 2022-04-06 VITALS — BP 133/74 | HR 83

## 2022-04-06 DIAGNOSIS — N3010 Interstitial cystitis (chronic) without hematuria: Secondary | ICD-10-CM

## 2022-04-06 DIAGNOSIS — N200 Calculus of kidney: Secondary | ICD-10-CM

## 2022-04-06 DIAGNOSIS — N3281 Overactive bladder: Secondary | ICD-10-CM | POA: Diagnosis not present

## 2022-04-06 DIAGNOSIS — Z9049 Acquired absence of other specified parts of digestive tract: Secondary | ICD-10-CM | POA: Diagnosis not present

## 2022-04-06 LAB — POCT URINALYSIS DIPSTICK
Bilirubin, UA: NEGATIVE
Blood, UA: NEGATIVE
Glucose, UA: NEGATIVE
Nitrite, UA: NEGATIVE
Protein, UA: NEGATIVE
Spec Grav, UA: 1.015 (ref 1.010–1.025)
Urobilinogen, UA: 0.2 E.U./dL
pH, UA: 7 (ref 5.0–8.0)

## 2022-04-06 MED ORDER — HYDROXYZINE HCL 10 MG PO TABS: 10.0000 mg | ORAL_TABLET | Freq: Every day | ORAL | 11 refills | Status: DC

## 2022-04-06 MED ORDER — ELMIRON 100 MG PO CAPS: 100.0000 mg | ORAL_CAPSULE | Freq: Two times a day (BID) | ORAL | 11 refills | Status: DC

## 2022-04-06 NOTE — Patient Instructions (Signed)

## 2022-04-06 NOTE — Progress Notes (Signed)
04/06/2022 2:01 PM   Bethany King [birth year] [id number]  Referring provider: Elfredia Nevins, MD [address] [place],  [place] [address]  Followup nephrolithiasis and IC   HPI: Bethany King is a 51yo here for followup for nephrolithiasis and IC. KUB today shows stable bilateral renal calculi. She denies any flank pain. No stone events since last visit. She is doing well on elmiron and hydroxyzine. No urinary urgency, frequency or pelvic pain. No other complaints today   PMH: Past Medical History:  Diagnosis Date   Allergic rhinitis    Anxiety    Arthritis    Bladder pain    Complication of anesthesia    Costochondritis    hx   DDD (degenerative disc disease), lumbar    Depression    GERD (gastroesophageal reflux disease)    Hepatic hemangioma 2006   stable   History of adenomatous polyp of colon    History of colitis    History of gastritis    History of kidney stones    HTN (hypertension)    Irritable bowel syndrome (IBS)    Medullary sponge kidney    left   Migraine    Nephrolithiasis    bilateral-- nonobstructive per CT   PONV (postoperative nausea and vomiting)    Psoriatic arthritis (HCC)    Psoriatic arthritis (HCC)    Spinal stenosis    Urgency of urination     Surgical History: Past Surgical History:  Procedure Laterality Date   ANTERIOR CERVICAL DECOMP/DISCECTOMY FUSION  02/26/2014   C5 - C6; fusion with plate   BIOPSY  01/04/2019   Procedure: BIOPSY;  Surgeon: Malissa Hippo, MD;  [facility];  Service: Endoscopy;;  gastric polyps   BIOPSY  02/10/2022   Procedure: BIOPSY;  Surgeon: Dolores Frame, MD;  [facility];  Service: Gastroenterology;;   Fidela Salisbury RELEASE Bilateral right 04-01-2010/  left 05-30-2010   CHOLECYSTECTOMY N/A 07/14/2013   Procedure: LAPAROSCOPIC CHOLECYSTECTOMY;  Surgeon: Dalia Heading, MD;  Location: [facility];  Service: General;  Laterality: N/A;   COLONOSCOPY  10-13-2010    COLONOSCOPY N/A 01/04/2019   Procedure: COLONOSCOPY;  Surgeon: Malissa Hippo, MD;  [facility];  Service: Endoscopy;  Laterality: N/A;   COLONOSCOPY WITH PROPOFOL N/A 02/10/2022   Procedure: COLONOSCOPY WITH PROPOFOL;  Surgeon: Dolores Frame, MD;  [facility];  Service: Gastroenterology;  Laterality: N/A;  915 ASA 3   CYSTO WITH HYDRODISTENSION N/A 04/02/2015   Procedure: CYSTOSCOPY/HYDRODISTENSION, BLADDER BIOPSY WITH FULGURATION;  Surgeon: Bjorn Pippin, MD;  Location: [facility];  Service: Urology;  Laterality: N/A;   CYSTO WITH HYDRODISTENSION N/A 03/25/2018   Procedure: CYSTOSCOPY/HYDRODISTENSION, INSTILL MARCAIN AND PYRIDIUM;  Surgeon: Bjorn Pippin, MD;  Location: [facility];  Service: Urology;  Laterality: N/A;   CYSTO WITH HYDRODISTENSION N/A 01/15/2020   Procedure: CYSTOSCOPY/HYDRODISTENSION;  Surgeon: Malen Gauze, MD;  Location: [facility];  Service: Urology;  Laterality: N/A;   CYSTOSCOPY W/ URETERAL STENT PLACEMENT Bilateral 04/08/2016   Procedure: CYSTOSCOPY WITH BILATERAL RETROGRADE PYELOGRAM, BILATERAL URETERAL STENT PLACEMENT;  Surgeon: Malen Gauze, MD;  Location: [facility];  Service: Urology;  Laterality: Bilateral;   CYSTOSCOPY W/ URETERAL STENT PLACEMENT Left 04/15/2016   Procedure: CYSTOSCOPY WITH RETROGRADE PYELOGRAM/URETERAL STENT PLACEMENT;  Surgeon: Malen Gauze, MD;  [facility]: [facility];  Service: Urology;  Laterality: Left;   CYSTOSCOPY WITH BIOPSY N/A 01/15/2020   Procedure: CYSTOSCOPY WITH BLADDER BIOPSY AND FULGERATION;  Surgeon:  Shantice Menger, Mardene Celeste, MD;  Location: [facility];  Service: Urology;  Laterality: N/A;   CYSTOSCOPY WITH HOLMIUM LASER LITHOTRIPSY Right 04/08/2016   Procedure: CYSTOSCOPY WITH RIGHT RENAL STONE EXTRACTION WITH HOLMIUM LASER LITHOTRIPSY;  Surgeon: Malen Gauze, MD;  [facility]: [facility];  Service: Urology;  Laterality: Right;   CYSTOSCOPY WITH HOLMIUM LASER LITHOTRIPSY Left 04/15/2016   Procedure:  CYSTOSCOPY WITH HOLMIUM LASER LITHOTRIPSY;  Surgeon: Malen Gauze, MD;  [facility];  Service: Urology;  Laterality: Left;   CYSTOSCOPY WITH RETROGRADE PYELOGRAM, URETEROSCOPY AND STENT PLACEMENT Left 07/05/2017   Procedure: CYSTOSCOPY WITH RETROGRADE PYELOGRAM, URETEROSCOPY AND STENT PLACEMENT;  Surgeon: Malen Gauze, MD;  [facility]: [facility];  Service: Urology;  Laterality: Left;   DIAGNOSTIC LAPAROSCOPY     DILATION AND CURETTAGE OF UTERUS     DILITATION & CURRETTAGE/HYSTROSCOPY WITH THERMACHOICE ABLATION N/A 09/07/2012   Procedure: DILATATION & CURETTAGE/HYSTEROSCOPY WITH THERMACHOICE ABLATION;  Surgeon: Lazaro Arms, MD;  Location: [facility];  Service: Gynecology;  Laterality: N/A;   ESOPHAGOGASTRODUODENOSCOPY  10/14/10   ESOPHAGOGASTRODUODENOSCOPY N/A 01/04/2019   Procedure: ESOPHAGOGASTRODUODENOSCOPY (EGD);  Surgeon: Malissa Hippo, MD;  [facility];  Service: Endoscopy;  Laterality: N/A;  9:30   ESOPHAGOGASTRODUODENOSCOPY (EGD) WITH PROPOFOL N/A 02/10/2022   Procedure: ESOPHAGOGASTRODUODENOSCOPY (EGD) WITH PROPOFOL;  Surgeon: Dolores Frame, MD;  [facility];  Service: Gastroenterology;  Laterality: N/A;   EXTRACORPOREAL SHOCK WAVE LITHOTRIPSY  multiple   HOLMIUM LASER APPLICATION Left 07/05/2017   Procedure: HOLMIUM LASER APPLICATION;  Surgeon: Malen Gauze, MD;  Location: [facility];  Service: Urology;  Laterality: Left;   PERCUTANEOUS NEPHROSTOLITHOTOMY  2003   PLANTAR FASCIA SURGERY Bilateral right 2008//  left 2010   POLYPECTOMY  01/04/2019   Procedure: POLYPECTOMY;  Surgeon: Malissa Hippo, MD;  [facility];  Service: Endoscopy;;  colon   STONE EXTRACTION WITH BASKET Left 04/15/2016   Procedure: STONE EXTRACTION WITH BASKET;  Surgeon: Malen Gauze, MD;  Location: [facility];  Service: Urology;  Laterality: Left;    Home Medications:  Allergies as of 04/06/2022       Reactions   Hydromorphone Itching   Prednisone  Other (See Comments)   Increase anxiety symptoms, insomnia   Tramadol Other (See Comments)   Causes dizziness   Sulfa Antibiotics Rash        Medication List        Accurate as of April 06, 2022  2:01 PM. If you have any questions, ask your nurse or doctor.          ALPRAZolam 0.5 MG tablet Commonly known as: XANAX Take 0.5 mg by mouth 2 (two) times daily as needed for anxiety.   aluminum hydroxide-magnesium carbonate 95-358 MG/15ML Susp Commonly known as: GAVISCON Take 15 mLs by mouth at bedtime.   amoxicillin-clavulanate 875-125 MG tablet Commonly known as: AUGMENTIN Take 1 tablet by mouth every 12 (twelve) hours.   DULoxetine 60 MG capsule Commonly known as: CYMBALTA Take 60 mg by mouth daily.   Elmiron 100 MG capsule Generic drug: pentosan polysulfate TAKE 1 CAPSULE(100 MG) BY MOUTH THREE TIMES DAILY What changed:  how much to take how to take this when to take this additional instructions   enalapril 10 MG tablet Commonly known as: VASOTEC Take 10 mg by mouth daily.   esomeprazole 20 MG capsule Commonly known as: NEXIUM Take 40 mg by mouth every morning.   estradiol 1 MG tablet Commonly known as: ESTRACE TAKE 1  TABLET(1 MG) BY MOUTH DAILY   fluticasone 50 MCG/ACT nasal spray Commonly known as: FLONASE Place 1 spray into both nostrils daily as needed for allergies or rhinitis.   HYDROcodone-acetaminophen 10-325 MG tablet Commonly known as: NORCO Take 1 tablet by mouth 4 (four) times daily as needed for moderate pain. What changed:  how much to take when to take this   hydrOXYzine 10 MG tablet Commonly known as: ATARAX Take 1 tablet (10 mg total) by mouth at bedtime.   potassium chloride SA 20 MEQ tablet Commonly known as: KLOR-CON M Take 20 mEq by mouth 2 (two) times daily.   progesterone 200 MG capsule Commonly known as: PROMETRIUM TAKE 1 CAPSULE(200 MG) BY MOUTH AT BEDTIME   Tremfya 100 MG/ML Sosy Generic drug: Guselkumab Inject  100 mg into the skin every 8 (eight) weeks.   triamterene-hydrochlorothiazide 37.5-25 MG capsule Commonly known as: DYAZIDE Take 1 capsule by mouth daily.   Vitamin D3 125 MCG (5000 UT) Caps Take 5,000 Units by mouth daily.        Allergies:  Allergies  Allergen Reactions   Hydromorphone Itching   Prednisone Other (See Comments)    Increase anxiety symptoms, insomnia   Tramadol Other (See Comments)    Causes dizziness    Sulfa Antibiotics Rash    Family History: Family History  Problem Relation Age of Onset   Irritable bowel syndrome Mother    Scleroderma Mother    Rheum arthritis Mother    Asthma Mother    Melanoma Father    Cancer Father        Melanoma   Colon cancer Cousin 59       second cousin Nettie Elm Lovelace)   Cancer Maternal Grandmother        Ovarian   Diabetes Maternal Grandmother    Thyroid disease Maternal Aunt     Social History:  reports that she has never smoked. She has never been exposed to tobacco smoke. She has never used smokeless tobacco. She reports that she does not drink alcohol and does not use drugs.  ROS: All other review of systems were reviewed and are negative except what is noted above in HPI  Physical Exam: BP 133/74   Pulse 83   Constitutional:  Alert and oriented, No acute distress. HEENT: [place] AT, moist mucus membranes.  Trachea midline, no masses. Cardiovascular: No clubbing, cyanosis, or edema. Respiratory: Normal respiratory effort, no increased work of breathing. GI: Abdomen is soft, nontender, nondistended, no abdominal masses GU: No CVA tenderness.  Lymph: No cervical or inguinal lymphadenopathy. Skin: No rashes, bruises or suspicious lesions. Neurologic: Grossly intact, no focal deficits, moving all 4 extremities. Psychiatric: Normal mood and affect.  Laboratory Data: Lab Results  Component Value Date   WBC 7.0 03/21/2018   HGB 10.9 (L) 03/21/2018   HCT 35.3 (L) 03/21/2018   MCV 96.7 03/21/2018   PLT 330  03/21/2018    Lab Results  Component Value Date   CREATININE 0.73 02/05/2022    No results found for: "PSA"  No results found for: "TESTOSTERONE"  Lab Results  Component Value Date   HGBA1C 5.7 (H) 09/15/2011    Urinalysis    Component Value Date/Time   COLORURINE AMBER (A) 11/17/2019 1600   APPEARANCEUR Clear 10/01/2021 1630   LABSPEC 1.004 (L) 11/17/2019 1600   PHURINE 6.0 11/17/2019 1600   GLUCOSEU Negative 10/01/2021 1630   HGBUR SMALL (A) 11/17/2019 1600   BILIRUBINUR neg 04/06/2022 1327   BILIRUBINUR Negative 10/01/2021  1630   KETONESUR negative 11/22/2021 1328   KETONESUR NEGATIVE 11/17/2019 1600   PROTEINUR Negative 04/06/2022 1327   PROTEINUR 1+ (A) 10/01/2021 1630   PROTEINUR NEGATIVE 11/17/2019 1600   UROBILINOGEN 0.2 04/06/2022 1327   UROBILINOGEN 0.2 02/02/2013 1620   NITRITE neg 04/06/2022 1327   NITRITE Negative 10/01/2021 1630   NITRITE POSITIVE (A) 11/17/2019 1600   LEUKOCYTESUR Large (3+) (A) 04/06/2022 1327   LEUKOCYTESUR 1+ (A) 10/01/2021 1630   LEUKOCYTESUR LARGE (A) 11/17/2019 1600    Lab Results  Component Value Date   LABMICR See below: 10/01/2021   WBCUA 6-10 (A) 10/01/2021   LABEPIT 0-10 10/01/2021   MUCUS Present 10/01/2021   BACTERIA Moderate (A) 10/01/2021    Pertinent Imaging: KUB today: Images reviewed and discussed with the patient Results for orders placed during the hospital encounter of 10/01/21  Abdomen 1 view (KUB)  Narrative CLINICAL DATA:  Renal stones  EXAM: ABDOMEN - 1 VIEW  COMPARISON:  Previous studies including the examination of 01/28/2021  FINDINGS: There are multiple calcific densities scattered throughout both kidneys. Largest of the stones is in the lower pole of left kidney measuring 8 mm. There are no calcific densities in the courses of the ureters. Bowel gas pattern is nonspecific. Small to moderate amount of stool is seen in the ascending and transverse colon. Surgical clips are seen in the  right upper quadrant.  IMPRESSION: Multiple bilateral renal stones with no significant interval change. Nonspecific bowel gas pattern.   Electronically Signed By: Ernie Avena M.D. On: 10/03/2021 16:50  No results found for this or any previous visit.  No results found for this or any previous visit.  No results found for this or any previous visit.  Results for orders placed during the hospital encounter of 09/01/17  US RENAL  Narrative CLINICAL DATA:  Kidney stones  EXAM: RENAL / URINARY TRACT ULTRASOUND COMPLETE  COMPARISON:  Abdominal ultrasound of August 03, 2017  FINDINGS: Right Kidney:  Length: 10.7 cm. There is cortical thinning diffusely. The cortical echotexture remains lower than that of the adjacent liver. There is a nonobstructing mid/upper pole stone measuring approximately 7 mm in diameter. There is no hydronephrosis.  Left Kidney:  Length: 11 cm. There is diffuse cortical thinning similar to that on the right. There is a mid to upper pole stone measuring 6 mm in diameter. There is no hydronephrosis.  Bladder:  Appears normal for degree of bladder distention. Bilateral ureteral jets are observed.  IMPRESSION: Bilateral nonobstructing kidney stones. Diffuse renal cortical thinning. No hydronephrosis. Normal appearing urinary bladder.   Electronically Signed By: David  [place] M.D. On: 09/01/2017 10:23  No valid procedures specified. No results found for this or any previous visit.  No results found for this or any previous visit.   Assessment & Plan:    1. Renal calculus -RTC 6 months with KUB - POCT urinalysis dipstick  2. Chronic interstitial cystitis Continue elmiron and hydroxyzine     No follow-ups on file.  Wilkie Aye, MD  [facility]

## 2022-04-08 LAB — URINE CULTURE

## 2022-04-08 NOTE — Progress Notes (Signed)
Please review

## 2022-04-09 ENCOUNTER — Encounter (INDEPENDENT_AMBULATORY_CARE_PROVIDER_SITE_OTHER): Payer: Self-pay | Admitting: Gastroenterology

## 2022-04-09 ENCOUNTER — Ambulatory Visit (INDEPENDENT_AMBULATORY_CARE_PROVIDER_SITE_OTHER): Payer: Medicare HMO | Admitting: Gastroenterology

## 2022-04-09 VITALS — BP 121/80 | HR 76 | Temp 98.7°F | Ht 62.5 in | Wt 175.0 lb

## 2022-04-09 DIAGNOSIS — K219 Gastro-esophageal reflux disease without esophagitis: Secondary | ICD-10-CM | POA: Diagnosis not present

## 2022-04-09 NOTE — Patient Instructions (Signed)
I'm so glad you are doing better! Let's continue with your current regimen of diet and exercise and protonix since this is working well for you Please continue to avoid straining and try to limit toilet time to avoid worsening of hemorrhoids, if these do become more of an issue and you wish to pursue hemorrhoid banding, you can let me know. We will plan to see you on as needed basis, please let me know if you have any new or worsening symptoms

## 2022-04-09 NOTE — Progress Notes (Addendum)
Referring Provider: Redmond School, MD Primary Care Physician:  Redmond School, MD Primary GI Physician: Jenetta Downer   Chief Complaint  Patient presents with   Gastroesophageal Reflux    3 month follow up. Reports abdominal pain is better since taking fiber and exercising. Reflux better since being on protonix for last 3 months.    HPI:   Bethany King is a 51 y.o. female with past medical history of anxiety, DDD, depression, GERD, hepatic hemangioma, HTN, psoriatic arthritis   Patient presenting today for follow up of GERD.  Last seen July 2023, at that time having epigastric pain, bloating, belching. Heartburn 2-3x/week. Taking nexium '20mg'$  daily, having more symptoms in the evenings. Start protonix by pcp but had nausea and headache. Intermittent rectal bleeding 4 months prior. Some constipation at times. On hydrocodone BID  Recommended to have EGD/colonoscopy, nexium '20mg'$  BID, benefiber 1TID with meals. Recommended GES but patient declined, would like to try gastroparesis diet instead.  Present: Patient states she stopped nexium and went back on protonix '40mg'$  daily a few months ago. She feels that this is controlling her symptoms well. No breakthrough GERD symptoms. Denies nausea or vomiting. No abdominal pain. Constipation is much improved with fiber and exercise. She is having two BMs per day. Rectal bleeding is minimal now as constipation is improved. Has some occasional rectal pain if she sits too long but has been much more active recently, therefore hemorrhoids are not really causing her issues. She is eating smaller meals now and feels that this is much more tolerated. She is trying to stay more active and make changes to help improve her symptoms and feels that GI symptoms are all very well controlled at this time. No red flag symptoms. Patient denies melena, hematochezia, nausea, vomiting, diarrhea, constipation, dysphagia, odyonophagia, early satiety or unintentional weight  loss.   Last Colonoscopy:02/10/22- Hemorrhoids found on perianal exam. - The examined portion of the ileum was normal. - The entire examined colon is normal - Non-bleeding external and internal hemorrhoids. - No specimens collected. Last Endoscopy:02/10/22- Normal esophagus. - Z-line regular, 42 cm from the incisors. - A few fundic gland polyps. - Normal stomach otherwise. Biopsied-reactive gastropathy, no h pylori - Normal examined duodenum.  Recommendations:  Repeat colonoscopy in 5 years   Past Medical History:  Diagnosis Date   Allergic rhinitis    Anxiety    Arthritis    Bladder pain    Complication of anesthesia    Costochondritis    hx   DDD (degenerative disc disease), lumbar    Depression    GERD (gastroesophageal reflux disease)    Hepatic hemangioma 2006   stable   History of adenomatous polyp of colon    History of colitis    History of gastritis    History of kidney stones    HTN (hypertension)    Irritable bowel syndrome (IBS)    Medullary sponge kidney    left   Migraine    Nephrolithiasis    bilateral-- nonobstructive per CT   PONV (postoperative nausea and vomiting)    Psoriatic arthritis (HCC)    Psoriatic arthritis (HCC)    Spinal stenosis    Urgency of urination     Past Surgical History:  Procedure Laterality Date   ANTERIOR CERVICAL DECOMP/DISCECTOMY FUSION  02/26/2014   C5 - C6; fusion with plate   BIOPSY  5/36/1443   Procedure: BIOPSY;  Surgeon: Rogene Houston, MD;  [facility];  Service: Endoscopy;;  gastric  polyps   BIOPSY  02/10/2022   Procedure: BIOPSY;  Surgeon: Harvel Quale, MD;  [facility];  Service: Gastroenterology;;   CARPAL TUNNEL RELEASE Bilateral right 04-01-2010/  left 05-30-2010   CHOLECYSTECTOMY N/A 07/14/2013   Procedure: LAPAROSCOPIC CHOLECYSTECTOMY;  Surgeon: Jamesetta So, MD;  Location: [facility];  Service: General;  Laterality: N/A;   COLONOSCOPY  10-13-2010   COLONOSCOPY N/A  01/04/2019   Procedure: COLONOSCOPY;  Surgeon: Rogene Houston, MD;  [facility];  Service: Endoscopy;  Laterality: N/A;   COLONOSCOPY WITH PROPOFOL N/A 02/10/2022   Procedure: COLONOSCOPY WITH PROPOFOL;  Surgeon: Harvel Quale, MD;  [facility];  Service: Gastroenterology;  Laterality: N/A;  915 ASA 3   CYSTO WITH HYDRODISTENSION N/A 04/02/2015   Procedure: CYSTOSCOPY/HYDRODISTENSION, BLADDER BIOPSY WITH FULGURATION;  Surgeon: Irine Seal, MD;  Location: [facility];  Service: Urology;  Laterality: N/A;   CYSTO WITH HYDRODISTENSION N/A 03/25/2018   Procedure: CYSTOSCOPY/HYDRODISTENSION, INSTILL MARCAIN AND PYRIDIUM;  Surgeon: Irine Seal, MD;  Location: [facility];  Service: Urology;  Laterality: N/A;   CYSTO WITH HYDRODISTENSION N/A 01/15/2020   Procedure: CYSTOSCOPY/HYDRODISTENSION;  Surgeon: Cleon Gustin, MD;  Location: [facility];  Service: Urology;  Laterality: N/A;   CYSTOSCOPY W/ URETERAL STENT PLACEMENT Bilateral 04/08/2016   Procedure: CYSTOSCOPY WITH BILATERAL RETROGRADE PYELOGRAM, BILATERAL URETERAL STENT PLACEMENT;  Surgeon: Cleon Gustin, MD;  Location: [facility];  Service: Urology;  Laterality: Bilateral;   CYSTOSCOPY W/ URETERAL STENT PLACEMENT Left 04/15/2016   Procedure: CYSTOSCOPY WITH RETROGRADE PYELOGRAM/URETERAL STENT PLACEMENT;  Surgeon: Cleon Gustin, MD;  Location: [facility];  Service: Urology;  Laterality: Left;   CYSTOSCOPY WITH BIOPSY N/A 01/15/2020   Procedure: CYSTOSCOPY WITH BLADDER BIOPSY AND FULGERATION;  Surgeon: Cleon Gustin, MD;  [facility];  Service: Urology;  Laterality: N/A;   CYSTOSCOPY WITH HOLMIUM LASER LITHOTRIPSY Right 04/08/2016   Procedure: CYSTOSCOPY WITH RIGHT RENAL STONE EXTRACTION WITH HOLMIUM LASER LITHOTRIPSY;  Surgeon: Cleon Gustin, MD;  [facility];  Service: Urology;  Laterality: Right;   CYSTOSCOPY WITH HOLMIUM LASER LITHOTRIPSY Left 04/15/2016   Procedure: CYSTOSCOPY WITH  HOLMIUM LASER LITHOTRIPSY;  Surgeon: Cleon Gustin, MD;  [facility];  Service: Urology;  Laterality: Left;   CYSTOSCOPY WITH RETROGRADE PYELOGRAM, URETEROSCOPY AND STENT PLACEMENT Left 07/05/2017   Procedure: CYSTOSCOPY WITH RETROGRADE PYELOGRAM, URETEROSCOPY AND STENT PLACEMENT;  Surgeon: Cleon Gustin, MD;  Location: [facility];  Service: Urology;  Laterality: Left;   DIAGNOSTIC LAPAROSCOPY     DILATION AND CURETTAGE OF UTERUS     DILITATION & CURRETTAGE/HYSTROSCOPY WITH THERMACHOICE ABLATION N/A 09/07/2012   Procedure: DILATATION & CURETTAGE/HYSTEROSCOPY WITH THERMACHOICE ABLATION;  Surgeon: Florian Buff, MD;  Location: [facility];  Service: Gynecology;  Laterality: N/A;   ESOPHAGOGASTRODUODENOSCOPY  10/14/10   ESOPHAGOGASTRODUODENOSCOPY N/A 01/04/2019   Procedure: ESOPHAGOGASTRODUODENOSCOPY (EGD);  Surgeon: Rogene Houston, MD;  [facility];  Service: Endoscopy;  Laterality: N/A;  9:30   ESOPHAGOGASTRODUODENOSCOPY (EGD) WITH PROPOFOL N/A 02/10/2022   Procedure: ESOPHAGOGASTRODUODENOSCOPY (EGD) WITH PROPOFOL;  Surgeon: Harvel Quale, MD;  [facility];  Service: Gastroenterology;  Laterality: N/A;   EXTRACORPOREAL SHOCK WAVE LITHOTRIPSY  multiple   HOLMIUM LASER APPLICATION Left 06/24/6043   Procedure: HOLMIUM LASER APPLICATION;  Surgeon: Cleon Gustin, MD;  Location: [facility];  Service: Urology;  Laterality: Left;   PERCUTANEOUS NEPHROSTOLITHOTOMY  2003   PLANTAR FASCIA SURGERY Bilateral right 2008//  left 2010  POLYPECTOMY  01/04/2019   Procedure: POLYPECTOMY;  Surgeon: Rogene Houston, MD;  [facility];  Service: Endoscopy;;  colon   STONE EXTRACTION WITH BASKET Left 04/15/2016   Procedure: STONE EXTRACTION WITH BASKET;  Surgeon: Cleon Gustin, MD;  Location: [facility];  Service: Urology;  Laterality: Left;    Current Outpatient Medications  Medication Sig Dispense Refill   ALPRAZolam (XANAX) 0.5 MG tablet Take 0.5 mg by mouth 2  (two) times daily as needed for anxiety.     Cholecalciferol (VITAMIN D3) 125 MCG (5000 UT) CAPS Take 5,000 Units by mouth daily.     DULoxetine (CYMBALTA) 60 MG capsule Take 60 mg by mouth daily.     estradiol (ESTRACE) 1 MG tablet TAKE 1 TABLET(1 MG) BY MOUTH DAILY 30 tablet 12   fluticasone (FLONASE) 50 MCG/ACT nasal spray Place 1 spray into both nostrils daily as needed for allergies or rhinitis.     HYDROcodone-acetaminophen (NORCO) 10-325 MG tablet Take 1 tablet by mouth 4 (four) times daily as needed for moderate pain. (Patient taking differently: Take 1-2 tablets by mouth every 6 (six) hours as needed for moderate pain.) 30 tablet 0   hydrOXYzine (ATARAX) 10 MG tablet Take 1 tablet (10 mg total) by mouth at bedtime. 30 tablet 11   pentosan polysulfate (ELMIRON) 100 MG capsule Take 1 capsule (100 mg total) by mouth 2 (two) times daily. 60 capsule 11   potassium chloride SA (K-DUR,KLOR-CON) 20 MEQ tablet Take 20 mEq by mouth 2 (two) times daily.     progesterone (PROMETRIUM) 200 MG capsule TAKE 1 CAPSULE(200 MG) BY MOUTH AT BEDTIME 30 capsule 12   TREMFYA 100 MG/ML SOSY Inject 100 mg into the skin every 8 (eight) weeks.     triamterene-hydrochlorothiazide (DYAZIDE) 37.5-25 MG capsule Take 1 capsule by mouth daily.   11   No current facility-administered medications for this visit.    Allergies as of 04/09/2022 - Review Complete 04/09/2022  Allergen Reaction Noted   Hydromorphone Itching 04/29/2011   Prednisone Other (See Comments) 07/08/2012   Tramadol Other (See Comments) 02/19/2021   Sulfa antibiotics Rash 03/26/2015    Family History  Problem Relation Age of Onset   Irritable bowel syndrome Mother    Scleroderma Mother    Rheum arthritis Mother    Asthma Mother    Melanoma Father    Cancer Father        Melanoma   Colon cancer Cousin 61       second cousin Sunday Spillers Lovelace)   Cancer Maternal Grandmother        Ovarian   Diabetes Maternal Grandmother    Thyroid disease  Maternal Aunt     Social History   Socioeconomic History   Marital status: Divorced    Spouse name: Not on file   Number of children: 0   Years of education: Not on file   Highest education level: Not on file  Occupational History   Occupation: ORDER PROCESSER    Employer: [employer]  Tobacco Use   Smoking status: Never    Passive exposure: Never   Smokeless tobacco: Never  Vaping Use   Vaping Use: Never used  Substance and Sexual Activity   Alcohol use: No   Drug use: No   Sexual activity: Yes    Birth control/protection: Surgical, Post-menopausal    Comment: ablation  Other Topics Concern   Not on file  Social History Narrative   Not on file   Social  Determinants of Health   Financial Resource Strain: Low Risk  (04/10/2021)   Overall Financial Resource Strain (CARDIA)    Difficulty of Paying Living Expenses: Not very hard  Food Insecurity: No Food Insecurity (04/10/2021)   Hunger Vital Sign    Worried About Running Out of Food in the Last Year: Never true    Ran Out of Food in the Last Year: Never true  Transportation Needs: No Transportation Needs (04/10/2021)   PRAPARE - Hydrologist (Medical): No    Lack of Transportation (Non-Medical): No  Physical Activity: Insufficiently Active (04/10/2021)   Exercise Vital Sign    Days of Exercise per Week: 2 days    Minutes of Exercise per Session: 30 min  Stress: Stress Concern Present (04/10/2021)   [place]    Feeling of Stress : To some extent  Social Connections: Moderately Integrated (04/10/2021)   Social Connection and Isolation Panel [NHANES]    Frequency of Communication with Friends and Family: Three times a week    Frequency of Social Gatherings with Friends and Family: Twice a week    Attends Religious Services: More than 4 times per year    Active Member of [employer] or Organizations: Yes    Attends Theatre manager Meetings: 1 to 4 times per year    Marital Status: Divorced   Review of systems General: negative for malaise, night sweats, fever, chills, weight loss Neck: Negative for lumps, goiter, pain and significant neck swelling Resp: Negative for cough, wheezing, dyspnea at rest CV: Negative for chest pain, leg swelling, palpitations, orthopnea GI: denies melena, hematochezia, nausea, vomiting, diarrhea, constipation, dysphagia, odyonophagia, early satiety or unintentional weight loss.  MSK: Negative for joint pain or swelling, back pain, and muscle pain. Derm: Negative for itching or rash Psych: Denies depression, anxiety, memory loss, confusion. No homicidal or suicidal ideation.  Heme: Negative for prolonged bleeding, bruising easily, and swollen nodes. Endocrine: Negative for cold or heat intolerance, polyuria, polydipsia and goiter. Neuro: negative for tremor, gait imbalance, syncope and seizures. The remainder of the review of systems is noncontributory.  Physical Exam: BP 121/80 (BP Location: Left Arm, Patient Position: Sitting, Cuff Size: Large)   Pulse 76   Temp 98.7 F (37.1 C) (Oral)   Ht 5' 2.5" (1.588 m)   Wt 175 lb (79.4 kg)   BMI 31.50 kg/m  General:   Alert and oriented. No distress noted. Pleasant and cooperative.  Head:  Normocephalic and atraumatic. Eyes:  Conjuctiva clear without scleral icterus. Mouth:  Oral mucosa pink and moist. Good dentition. No lesions. Heart: Normal rate and rhythm, s1 and s2 heart sounds present.  Lungs: Clear lung sounds in all lobes. Respirations equal and unlabored. Abdomen:  +BS, soft, non-tender and non-distended. No rebound or guarding. No HSM or masses noted. Derm: No palmar erythema or jaundice Msk:  Symmetrical without gross deformities. Normal posture. Extremities:  Without edema. Neurologic:  Alert and  oriented x4 Psych:  Alert and cooperative. Normal mood and affect.  Invalid input(s): "6 MONTHS"    ASSESSMENT: Bethany King is a 51 y.o. female presenting today for follow up.  GERD is well managed on protonix '40mg'$  once daily. No breakthrough symptoms, dysphagia or odynophagia. Can continue with current regimen. PCP manages this prescription for her.  Constipation is improved with recent increase in exercise and benefiber. She is not having any issues at this time. Hemorrhoid are also not causing  much issue since she has been more active, will have occasional pain if she sits too long but this is not really an issue currently. Has very rare rectal bleeding now. We discussed hemorrhoid banding, however, patient does not feel that she is interested in pursing this at current time. If she has more issues with her hemorrhoids she will let me know.   She is doing multiple small meals per day and feels that this is much more tolerated for her. Denies bloating, nausea, vomiting or abdominal pain.   No red flag symptoms. Patient denies melena, hematochezia, nausea, vomiting, diarrhea, constipation, dysphagia, odyonophagia, early satiety or weight loss. Will plan to see patient on PRN basis.    PLAN:  Continue protonix '40mg'$  daily, managed by PCP 2. Continue with benefiber  3. Continue with exercise  4. Continue multilpe small meals per day  All questions were answered, patient verbalized understanding and is in agreement with plan as outlined above.    Follow Up: PRN  Shahzaib Azevedo L. Alver Sorrow, MSN, APRN, AGNP-C Adult-Gerontology Nurse Practitioner [facility]  I have reviewed the note and agree with the APP's assessment as described in this progress note  Maylon Peppers, MD Gastroenterology and Hepatology [facility]

## 2022-04-13 ENCOUNTER — Other Ambulatory Visit (HOSPITAL_COMMUNITY)
Admission: RE | Admit: 2022-04-13 | Discharge: 2022-04-13 | Disposition: A | Discharge status: Home / Self Care | Payer: Medicare HMO | Source: Ambulatory Visit | Attending: Adult Health | Admitting: Adult Health

## 2022-04-13 ENCOUNTER — Encounter: Payer: Self-pay | Admitting: Adult Health

## 2022-04-13 ENCOUNTER — Ambulatory Visit (INDEPENDENT_AMBULATORY_CARE_PROVIDER_SITE_OTHER): Payer: Medicare HMO | Admitting: Adult Health

## 2022-04-13 VITALS — BP 122/79 | HR 76 | Ht 62.0 in | Wt 175.0 lb

## 2022-04-13 DIAGNOSIS — Z1211 Encounter for screening for malignant neoplasm of colon: Secondary | ICD-10-CM | POA: Diagnosis not present

## 2022-04-13 DIAGNOSIS — K649 Unspecified hemorrhoids: Secondary | ICD-10-CM | POA: Diagnosis not present

## 2022-04-13 DIAGNOSIS — Z01419 Encounter for gynecological examination (general) (routine) without abnormal findings: Secondary | ICD-10-CM | POA: Insufficient documentation

## 2022-04-13 DIAGNOSIS — Z1151 Encounter for screening for human papillomavirus (HPV): Secondary | ICD-10-CM | POA: Diagnosis not present

## 2022-04-13 DIAGNOSIS — Z79890 Hormone replacement therapy (postmenopausal): Secondary | ICD-10-CM | POA: Diagnosis not present

## 2022-04-13 DIAGNOSIS — R69 Illness, unspecified: Secondary | ICD-10-CM | POA: Diagnosis not present

## 2022-04-13 LAB — HEMOCCULT GUIAC POC 1CARD (OFFICE): Fecal Occult Blood, POC: NEGATIVE

## 2022-04-13 NOTE — Progress Notes (Signed)
Patient ID: Bethany King, female   DOB: August 07, 1970, 51 y.o.   MRN: [id number] History of Present Illness: Bethany King is a 51 year old white female, divorced,G0P0, in for a well woman gyn exam and pap.  PCP is Dr Sherwood Gambler.    Current Medications, Allergies, Past Medical History, Past Surgical History, Family History and Social History were reviewed in [employer] [facility] electronic medical record.     Review of Systems: Patient denies any headaches, hearing loss, fatigue, blurred vision, shortness of breath, chest pain, abdominal pain, problems with bowel movements, urination,(has IC), or intercourse. No joint pain or mood swings.  Has sex about once every 3 months and had last about 2 weeks ago, and feels like could be yeast or BV Still has some hot flashes even with HRT She has estrogen vaginal cream but forgets to use   Physical Exam:BP 122/79 (BP Location: Left Arm, Patient Position: Sitting, Cuff Size: Normal)   Pulse 76   Ht 5\' 2"  (1.575 m)   Wt 175 lb (79.4 kg)   LMP 03/23/2022 (Approximate)   BMI 32.01 kg/m   General:  Well developed, well nourished, no acute distress Skin:  Warm and dry Neck:  Midline trachea, normal thyroid, good ROM, no lymphadenopathy Lungs; Clear to auscultation bilaterally Breast:  No dominant palpable mass, retraction, or nipple discharge Cardiovascular: Regular rate and rhythm Abdomen:  Soft, non tender, no hepatosplenomegaly Pelvic:  External genitalia is normal in appearance, no lesions.  The vagina is pale. Urethra has no lesions or masses. The cervix is smooth and atrophic, pap with HR HPV genotyping performed,  Uterus is felt to be normal size, shape, and contour.  No adnexal masses or tenderness noted.Bladder is non tender, no masses felt. Rectal: Good sphincter tone, no polyps, + hemorrhoids felt.  Hemoccult negative. Extremities/musculoskeletal:  No swelling or varicosities noted, no clubbing or cyanosis Psych:  No mood changes, alert and  cooperative,seems happy AA is 0 Fall risk is low    04/13/2022    1:40 PM 04/10/2021    1:38 PM 04/09/2020    1:46 PM  Depression screen PHQ 2/9  Decreased Interest 0 0 0  Down, Depressed, Hopeless 0 0 1  PHQ - 2 Score 0 0 1  Altered sleeping 0 2 1  Tired, decreased energy 0 0 1  Change in appetite 0 0 0  Feeling bad or failure about yourself  0 0 0  Trouble concentrating 0 0 1  Moving slowly or fidgety/restless 0 0 0  Suicidal thoughts 0 0 0  PHQ-9 Score 0 2 4       04/13/2022    1:40 PM 04/10/2021    1:38 PM 04/09/2020    1:47 PM  GAD 7 : Generalized Anxiety Score  Nervous, Anxious, on Edge 1 1 1   Control/stop worrying 0 1 1  Worry too much - different things 1 1 1   Trouble relaxing 1 1   Restless 0 1 0  Easily annoyed or irritable 1 1 1   Afraid - awful might happen 0 1 0  Total GAD 7 Score 4 7       Upstream - 04/13/22 1338       Pregnancy Intention Screening   Does the patient want to become pregnant in the next year? N/A    Does the patient's partner want to become pregnant in the next year? N/A    Would the patient like to discuss contraceptive options today? N/A      Contraception  Wrap Up   Current Method No Method - Other Reason   ablation   End Method No Method - Other Reason   ablation   Contraception Counseling Provided No             Examination chaperoned by Malachy Mood LPN  Impression and Plan: 1. Encounter for gynecological examination with Papanicolaou smear of cervix Pap sent Pap in 3 years if normal Physical in 1 year Labs with PCP Mammogram was negative 07/23/21 Colonoscopy was 02/10/22 good for 5 years   2. Encounter for screening fecal occult blood testing Hemoccult was negative    3. Hemorrhoids, unspecified hemorrhoid type Better, has hemorrhoid cream   4. Hormone replacement therapy (HRT) On estrace 1 mg daily and Prometrium 200 mg daily, has refills

## 2022-04-14 ENCOUNTER — Other Ambulatory Visit: Payer: Self-pay | Admitting: Adult Health

## 2022-04-16 LAB — CYTOLOGY - PAP
Comment: NEGATIVE
Diagnosis: UNDETERMINED — AB
High risk HPV: NEGATIVE

## 2022-04-20 ENCOUNTER — Encounter: Payer: Self-pay | Admitting: Adult Health

## 2022-04-20 ENCOUNTER — Telehealth: Payer: Self-pay | Admitting: Adult Health

## 2022-04-20 DIAGNOSIS — R87610 Atypical squamous cells of undetermined significance on cytologic smear of cervix (ASC-US): Secondary | ICD-10-CM

## 2022-04-20 HISTORY — DX: Atypical squamous cells of undetermined significance on cytologic smear of cervix (ASC-US): R87.610

## 2022-04-20 NOTE — Telephone Encounter (Signed)
Patient called concerning her abnormal pap results and was wanting to know what the next steps were. Patient is okay with a message on mychart. Please advise.

## 2022-05-18 ENCOUNTER — Other Ambulatory Visit: Payer: Self-pay | Admitting: Adult Health

## 2022-05-25 DIAGNOSIS — L409 Psoriasis, unspecified: Secondary | ICD-10-CM | POA: Diagnosis not present

## 2022-05-25 DIAGNOSIS — M79643 Pain in unspecified hand: Secondary | ICD-10-CM | POA: Diagnosis not present

## 2022-05-25 DIAGNOSIS — N3010 Interstitial cystitis (chronic) without hematuria: Secondary | ICD-10-CM | POA: Diagnosis not present

## 2022-05-25 DIAGNOSIS — Z79899 Other long term (current) drug therapy: Secondary | ICD-10-CM | POA: Diagnosis not present

## 2022-05-25 DIAGNOSIS — M25579 Pain in unspecified ankle and joints of unspecified foot: Secondary | ICD-10-CM | POA: Diagnosis not present

## 2022-05-25 DIAGNOSIS — M1990 Unspecified osteoarthritis, unspecified site: Secondary | ICD-10-CM | POA: Diagnosis not present

## 2022-05-25 DIAGNOSIS — M2550 Pain in unspecified joint: Secondary | ICD-10-CM | POA: Diagnosis not present

## 2022-05-25 DIAGNOSIS — L4050 Arthropathic psoriasis, unspecified: Secondary | ICD-10-CM | POA: Diagnosis not present

## 2022-06-15 ENCOUNTER — Ambulatory Visit: Payer: Self-pay

## 2022-06-22 ENCOUNTER — Other Ambulatory Visit: Payer: Self-pay | Admitting: Adult Health

## 2022-06-23 ENCOUNTER — Other Ambulatory Visit (HOSPITAL_COMMUNITY): Payer: Self-pay | Admitting: Adult Health

## 2022-06-23 DIAGNOSIS — Z1231 Encounter for screening mammogram for malignant neoplasm of breast: Secondary | ICD-10-CM

## 2022-06-26 DIAGNOSIS — R69 Illness, unspecified: Secondary | ICD-10-CM | POA: Diagnosis not present

## 2022-07-09 DIAGNOSIS — R69 Illness, unspecified: Secondary | ICD-10-CM | POA: Diagnosis not present

## 2022-07-09 DIAGNOSIS — Z6829 Body mass index (BMI) 29.0-29.9, adult: Secondary | ICD-10-CM | POA: Diagnosis not present

## 2022-07-09 DIAGNOSIS — K219 Gastro-esophageal reflux disease without esophagitis: Secondary | ICD-10-CM | POA: Diagnosis not present

## 2022-07-09 DIAGNOSIS — L4050 Arthropathic psoriasis, unspecified: Secondary | ICD-10-CM | POA: Diagnosis not present

## 2022-07-09 DIAGNOSIS — M5126 Other intervertebral disc displacement, lumbar region: Secondary | ICD-10-CM | POA: Diagnosis not present

## 2022-07-09 DIAGNOSIS — M5010 Cervical disc disorder with radiculopathy, unspecified cervical region: Secondary | ICD-10-CM | POA: Diagnosis not present

## 2022-07-09 DIAGNOSIS — J0100 Acute maxillary sinusitis, unspecified: Secondary | ICD-10-CM | POA: Diagnosis not present

## 2022-07-09 DIAGNOSIS — E663 Overweight: Secondary | ICD-10-CM | POA: Diagnosis not present

## 2022-07-09 DIAGNOSIS — G894 Chronic pain syndrome: Secondary | ICD-10-CM | POA: Diagnosis not present

## 2022-07-09 DIAGNOSIS — I10 Essential (primary) hypertension: Secondary | ICD-10-CM | POA: Diagnosis not present

## 2022-07-27 ENCOUNTER — Ambulatory Visit (HOSPITAL_COMMUNITY): Payer: Medicare HMO

## 2022-07-27 ENCOUNTER — Encounter (HOSPITAL_COMMUNITY): Payer: Self-pay

## 2022-09-03 DIAGNOSIS — F330 Major depressive disorder, recurrent, mild: Secondary | ICD-10-CM | POA: Diagnosis not present

## 2022-09-03 DIAGNOSIS — Z0001 Encounter for general adult medical examination with abnormal findings: Secondary | ICD-10-CM | POA: Diagnosis not present

## 2022-09-03 DIAGNOSIS — K219 Gastro-esophageal reflux disease without esophagitis: Secondary | ICD-10-CM | POA: Diagnosis not present

## 2022-09-03 DIAGNOSIS — Z6829 Body mass index (BMI) 29.0-29.9, adult: Secondary | ICD-10-CM | POA: Diagnosis not present

## 2022-09-03 DIAGNOSIS — E663 Overweight: Secondary | ICD-10-CM | POA: Diagnosis not present

## 2022-09-03 DIAGNOSIS — M5126 Other intervertebral disc displacement, lumbar region: Secondary | ICD-10-CM | POA: Diagnosis not present

## 2022-09-03 DIAGNOSIS — R69 Illness, unspecified: Secondary | ICD-10-CM | POA: Diagnosis not present

## 2022-09-03 DIAGNOSIS — L4050 Arthropathic psoriasis, unspecified: Secondary | ICD-10-CM | POA: Diagnosis not present

## 2022-09-03 DIAGNOSIS — I10 Essential (primary) hypertension: Secondary | ICD-10-CM | POA: Diagnosis not present

## 2022-09-03 DIAGNOSIS — Z1331 Encounter for screening for depression: Secondary | ICD-10-CM | POA: Diagnosis not present

## 2022-09-07 DIAGNOSIS — D518 Other vitamin B12 deficiency anemias: Secondary | ICD-10-CM | POA: Diagnosis not present

## 2022-09-07 DIAGNOSIS — E782 Mixed hyperlipidemia: Secondary | ICD-10-CM | POA: Diagnosis not present

## 2022-09-07 DIAGNOSIS — G9332 Myalgic encephalomyelitis/chronic fatigue syndrome: Secondary | ICD-10-CM | POA: Diagnosis not present

## 2022-09-07 DIAGNOSIS — E559 Vitamin D deficiency, unspecified: Secondary | ICD-10-CM | POA: Diagnosis not present

## 2022-09-07 DIAGNOSIS — Z0001 Encounter for general adult medical examination with abnormal findings: Secondary | ICD-10-CM | POA: Diagnosis not present

## 2022-09-14 DIAGNOSIS — M79643 Pain in unspecified hand: Secondary | ICD-10-CM | POA: Diagnosis not present

## 2022-09-14 DIAGNOSIS — L4050 Arthropathic psoriasis, unspecified: Secondary | ICD-10-CM | POA: Diagnosis not present

## 2022-09-14 DIAGNOSIS — L409 Psoriasis, unspecified: Secondary | ICD-10-CM | POA: Diagnosis not present

## 2022-09-14 DIAGNOSIS — Z79899 Other long term (current) drug therapy: Secondary | ICD-10-CM | POA: Diagnosis not present

## 2022-09-14 DIAGNOSIS — M25579 Pain in unspecified ankle and joints of unspecified foot: Secondary | ICD-10-CM | POA: Diagnosis not present

## 2022-09-14 DIAGNOSIS — M2550 Pain in unspecified joint: Secondary | ICD-10-CM | POA: Diagnosis not present

## 2022-09-14 DIAGNOSIS — M1990 Unspecified osteoarthritis, unspecified site: Secondary | ICD-10-CM | POA: Diagnosis not present

## 2022-09-14 DIAGNOSIS — N3010 Interstitial cystitis (chronic) without hematuria: Secondary | ICD-10-CM | POA: Diagnosis not present

## 2022-09-21 ENCOUNTER — Ambulatory Visit (HOSPITAL_COMMUNITY)
Admission: RE | Admit: 2022-09-21 | Discharge: 2022-09-21 | Disposition: A | Discharge status: Home / Self Care | Payer: Medicare HMO | Source: Ambulatory Visit | Attending: Adult Health | Admitting: Adult Health

## 2022-09-21 ENCOUNTER — Encounter (HOSPITAL_COMMUNITY): Payer: Self-pay

## 2022-09-21 DIAGNOSIS — Z1231 Encounter for screening mammogram for malignant neoplasm of breast: Secondary | ICD-10-CM | POA: Insufficient documentation

## 2022-10-05 ENCOUNTER — Ambulatory Visit: Payer: Medicare HMO | Admitting: Urology

## 2022-10-05 ENCOUNTER — Ambulatory Visit (HOSPITAL_COMMUNITY)
Admission: RE | Admit: 2022-10-05 | Discharge: 2022-10-05 | Disposition: A | Discharge status: Home / Self Care | Payer: Medicare HMO | Source: Ambulatory Visit | Attending: Urology | Admitting: Urology

## 2022-10-05 VITALS — BP 111/70 | HR 76

## 2022-10-05 DIAGNOSIS — N3010 Interstitial cystitis (chronic) without hematuria: Secondary | ICD-10-CM | POA: Diagnosis not present

## 2022-10-05 DIAGNOSIS — N200 Calculus of kidney: Secondary | ICD-10-CM

## 2022-10-05 DIAGNOSIS — N3281 Overactive bladder: Secondary | ICD-10-CM

## 2022-10-05 LAB — URINALYSIS, ROUTINE W REFLEX MICROSCOPIC
Bilirubin, UA: NEGATIVE
Glucose, UA: NEGATIVE
Ketones, UA: NEGATIVE
Nitrite, UA: NEGATIVE
Protein,UA: NEGATIVE
Specific Gravity, UA: 1.01 (ref 1.005–1.030)
Urobilinogen, Ur: 0.2 mg/dL (ref 0.2–1.0)
pH, UA: 6.5 (ref 5.0–7.5)

## 2022-10-05 LAB — MICROSCOPIC EXAMINATION
Epithelial Cells (non renal): >10 /hpf — AB (ref 0–10)
WBC, UA: >30 /hpf — AB (ref 0–5)

## 2022-10-05 MED ORDER — NITROFURANTOIN MACROCRYSTAL 50 MG PO CAPS: 50.0000 mg | ORAL_CAPSULE | Freq: Every day | ORAL | 3 refills | Status: DC

## 2022-10-05 MED ORDER — ELMIRON 100 MG PO CAPS: 100.0000 mg | ORAL_CAPSULE | Freq: Every day | ORAL | 11 refills | Status: DC

## 2022-10-05 MED ORDER — HYDROXYZINE HCL 10 MG PO TABS: 10.0000 mg | ORAL_TABLET | Freq: Every day | ORAL | 11 refills | Status: DC

## 2022-10-05 NOTE — Patient Instructions (Signed)

## 2022-10-05 NOTE — Progress Notes (Unsigned)
10/05/2022 1:46 PM   Bethany King 1971-02-20 [id number]  Referring provider: Elfredia Nevins, MD [address] [place],  [place] [address]  No chief complaint on file.   HPI: KUB shows stable bilateral renal calculi. She has nocturia 2-3x. She is on hydroxyzine and elmiron. She has not seen optometrist in the past 2 years.    PMH: Past Medical History:  Diagnosis Date   Allergic rhinitis    Anxiety    Arthritis    ASCUS of cervix with negative high risk HPV 04/20/2022   04/20/22 repeat in 3 years per ASCCP guidelines, 5 year risk CIN 3+ is 0.27%    Bladder pain    Complication of anesthesia    Costochondritis    hx   DDD (degenerative disc disease), lumbar    Depression    GERD (gastroesophageal reflux disease)    Hepatic hemangioma 2006   stable   History of adenomatous polyp of colon    History of colitis    History of gastritis    History of kidney stones    HTN (hypertension)    Irritable bowel syndrome (IBS)    Medullary sponge kidney    left   Migraine    Nephrolithiasis    bilateral-- nonobstructive per CT   PONV (postoperative nausea and vomiting)    Psoriatic arthritis    Psoriatic arthritis    Spinal stenosis    Urgency of urination     Surgical History: Past Surgical History:  Procedure Laterality Date   ANTERIOR CERVICAL DECOMP/DISCECTOMY FUSION  02/26/2014   C5 - C6; fusion with plate   BIOPSY  01/04/2019   Procedure: BIOPSY;  Surgeon: Malissa Hippo, MD;  [facility];  Service: Endoscopy;;  gastric polyps   BIOPSY  02/10/2022   Procedure: BIOPSY;  Surgeon: Dolores Frame, MD;  [facility];  Service: Gastroenterology;;   Fidela Salisbury RELEASE Bilateral right 04-01-2010/  left 05-30-2010   CHOLECYSTECTOMY N/A 07/14/2013   Procedure: LAPAROSCOPIC CHOLECYSTECTOMY;  Surgeon: Dalia Heading, MD;  Location: [facility];  Service: General;  Laterality: N/A;   COLONOSCOPY  10-13-2010   COLONOSCOPY N/A 01/04/2019    Procedure: COLONOSCOPY;  Surgeon: Malissa Hippo, MD;  [facility];  Service: Endoscopy;  Laterality: N/A;   COLONOSCOPY WITH PROPOFOL N/A 02/10/2022   Procedure: COLONOSCOPY WITH PROPOFOL;  Surgeon: Dolores Frame, MD;  [facility];  Service: Gastroenterology;  Laterality: N/A;  915 ASA 3   CYSTO WITH HYDRODISTENSION N/A 04/02/2015   Procedure: CYSTOSCOPY/HYDRODISTENSION, BLADDER BIOPSY WITH FULGURATION;  Surgeon: Bjorn Pippin, MD;  [facility];  Service: Urology;  Laterality: N/A;   CYSTO WITH HYDRODISTENSION N/A 03/25/2018   Procedure: CYSTOSCOPY/HYDRODISTENSION, INSTILL MARCAIN AND PYRIDIUM;  Surgeon: Bjorn Pippin, MD;  Location: [facility];  Service: Urology;  Laterality: N/A;   CYSTO WITH HYDRODISTENSION N/A 01/15/2020   Procedure: CYSTOSCOPY/HYDRODISTENSION;  Surgeon: Malen Gauze, MD;  Location: [facility];  Service: Urology;  Laterality: N/A;   CYSTOSCOPY W/ URETERAL STENT PLACEMENT Bilateral 04/08/2016   Procedure: CYSTOSCOPY WITH BILATERAL RETROGRADE PYELOGRAM, BILATERAL URETERAL STENT PLACEMENT;  Surgeon: Malen Gauze, MD;  Location: [facility];  Service: Urology;  Laterality: Bilateral;   CYSTOSCOPY W/ URETERAL STENT PLACEMENT Left 04/15/2016   Procedure: CYSTOSCOPY WITH RETROGRADE PYELOGRAM/URETERAL STENT PLACEMENT;  Surgeon: Malen Gauze, MD;  Location: [facility];  Service: Urology;  Laterality: Left;   CYSTOSCOPY WITH BIOPSY N/A 01/15/2020   Procedure: CYSTOSCOPY WITH  BLADDER BIOPSY AND FULGERATION;  Surgeon: Malen Gauze, MD;  [facility];  Service: Urology;  Laterality: N/A;   CYSTOSCOPY WITH HOLMIUM LASER LITHOTRIPSY Right 04/08/2016   Procedure: CYSTOSCOPY WITH RIGHT RENAL STONE EXTRACTION WITH HOLMIUM LASER LITHOTRIPSY;  Surgeon: Malen Gauze, MD;  Location: [facility];  Service: Urology;  Laterality: Right;   CYSTOSCOPY WITH HOLMIUM LASER LITHOTRIPSY Left 04/15/2016   Procedure: CYSTOSCOPY WITH HOLMIUM  LASER LITHOTRIPSY;  Surgeon: Malen Gauze, MD;  Location: [facility];  Service: Urology;  Laterality: Left;   CYSTOSCOPY WITH RETROGRADE PYELOGRAM, URETEROSCOPY AND STENT PLACEMENT Left 07/05/2017   Procedure: CYSTOSCOPY WITH RETROGRADE PYELOGRAM, URETEROSCOPY AND STENT PLACEMENT;  Surgeon: Malen Gauze, MD;  Location: [facility];  Service: Urology;  Laterality: Left;   DIAGNOSTIC LAPAROSCOPY     DILATION AND CURETTAGE OF UTERUS     DILITATION & CURRETTAGE/HYSTROSCOPY WITH THERMACHOICE ABLATION N/A 09/07/2012   Procedure: DILATATION & CURETTAGE/HYSTEROSCOPY WITH THERMACHOICE ABLATION;  Surgeon: Lazaro Arms, MD;  Location: [facility];  Service: Gynecology;  Laterality: N/A;   ESOPHAGOGASTRODUODENOSCOPY  10/14/10   ESOPHAGOGASTRODUODENOSCOPY N/A 01/04/2019   Procedure: ESOPHAGOGASTRODUODENOSCOPY (EGD);  Surgeon: Malissa Hippo, MD;  [facility];  Service: Endoscopy;  Laterality: N/A;  9:30   ESOPHAGOGASTRODUODENOSCOPY (EGD) WITH PROPOFOL N/A 02/10/2022   Procedure: ESOPHAGOGASTRODUODENOSCOPY (EGD) WITH PROPOFOL;  Surgeon: Dolores Frame, MD;  [facility];  Service: Gastroenterology;  Laterality: N/A;   EXTRACORPOREAL SHOCK WAVE LITHOTRIPSY  multiple   HOLMIUM LASER APPLICATION Left 07/05/2017   Procedure: HOLMIUM LASER APPLICATION;  Surgeon: Malen Gauze, MD;  Location: [facility];  Service: Urology;  Laterality: Left;   PERCUTANEOUS NEPHROSTOLITHOTOMY  2003   PLANTAR FASCIA SURGERY Bilateral right 2008//  left 2010   POLYPECTOMY  01/04/2019   Procedure: POLYPECTOMY;  Surgeon: Malissa Hippo, MD;  [facility];  Service: Endoscopy;;  colon   STONE EXTRACTION WITH BASKET Left 04/15/2016   Procedure: STONE EXTRACTION WITH BASKET;  Surgeon: Malen Gauze, MD;  Location: [facility];  Service: Urology;  Laterality: Left;    Home Medications:  Allergies as of 10/05/2022       Reactions   Hydromorphone Itching   Prednisone Other (See Comments)    Increase anxiety symptoms, insomnia   Tramadol Other (See Comments)   Causes dizziness   Sulfa Antibiotics Rash        Medication List        Accurate as of October 05, 2022  1:46 PM. If you have any questions, ask your nurse or doctor.          ALPRAZolam 0.5 MG tablet Commonly known as: XANAX Take 0.5 mg by mouth 2 (two) times daily as needed for anxiety.   DULoxetine 60 MG capsule Commonly known as: CYMBALTA Take 60 mg by mouth daily.   Elmiron 100 MG capsule Generic drug: pentosan polysulfate Take 1 capsule (100 mg total) by mouth 2 (two) times daily.   estradiol 1 MG tablet Commonly known as: ESTRACE TAKE 1 TABLET(1 MG) BY MOUTH DAILY   fluticasone 50 MCG/ACT nasal spray Commonly known as: FLONASE Place 1 spray into both nostrils daily as needed for allergies or rhinitis.   HYDROcodone-acetaminophen 10-325 MG tablet Commonly known as: NORCO Take 1 tablet by mouth 4 (four) times daily as needed for moderate pain. What changed:  how much to take when to take this   hydrOXYzine 10 MG tablet Commonly known as: ATARAX Take 1 tablet (10 mg  total) by mouth at bedtime.   potassium chloride SA 20 MEQ tablet Commonly known as: KLOR-CON M Take 20 mEq by mouth 2 (two) times daily.   progesterone 200 MG capsule Commonly known as: PROMETRIUM TAKE 1 CAPSULE(200 MG) BY MOUTH AT BEDTIME   Tremfya 100 MG/ML Sosy Generic drug: Guselkumab Inject 100 mg into the skin every 8 (eight) weeks.   triamterene-hydrochlorothiazide 37.5-25 MG capsule Commonly known as: DYAZIDE Take 1 capsule by mouth daily.   Vitamin D3 125 MCG (5000 UT) Caps Take 5,000 Units by mouth daily.        Allergies:  Allergies  Allergen Reactions   Hydromorphone Itching   Prednisone Other (See Comments)    Increase anxiety symptoms, insomnia   Tramadol Other (See Comments)    Causes dizziness    Sulfa Antibiotics Rash    Family History: Family History  Problem Relation Age of Onset    Irritable bowel syndrome Mother    Scleroderma Mother    Rheum arthritis Mother    Asthma Mother    Melanoma Father    Cancer Father        Melanoma   Colon cancer Cousin 22       second cousin Nettie Elm Lovelace)   Cancer Maternal Grandmother        Ovarian   Diabetes Maternal Grandmother    Thyroid disease Maternal Aunt     Social History:  reports that she has never smoked. She has never been exposed to tobacco smoke. She has never used smokeless tobacco. She reports that she does not drink alcohol and does not use drugs.  ROS: All other review of systems were reviewed and are negative except what is noted above in HPI  Physical Exam: BP 111/70   Pulse 76   Constitutional:  Alert and oriented, No acute distress. HEENT: [place] AT, moist mucus membranes.  Trachea midline, no masses. Cardiovascular: No clubbing, cyanosis, or edema. Respiratory: Normal respiratory effort, no increased work of breathing. GI: Abdomen is soft, nontender, nondistended, no abdominal masses GU: No CVA tenderness.  Lymph: No cervical or inguinal lymphadenopathy. Skin: No rashes, bruises or suspicious lesions. Neurologic: Grossly intact, no focal deficits, moving all 4 extremities. Psychiatric: Normal mood and affect.  Laboratory Data: Lab Results  Component Value Date   WBC 7.0 03/21/2018   HGB 10.9 (L) 03/21/2018   HCT 35.3 (L) 03/21/2018   MCV 96.7 03/21/2018   PLT 330 03/21/2018    Lab Results  Component Value Date   CREATININE 0.73 02/05/2022    No results found for: "PSA"  No results found for: "TESTOSTERONE"  Lab Results  Component Value Date   HGBA1C 5.7 (H) 09/15/2011    Urinalysis    Component Value Date/Time   COLORURINE AMBER (A) 11/17/2019 1600   APPEARANCEUR Clear 10/01/2021 1630   LABSPEC 1.004 (L) 11/17/2019 1600   PHURINE 6.0 11/17/2019 1600   GLUCOSEU Negative 10/01/2021 1630   HGBUR SMALL (A) 11/17/2019 1600   BILIRUBINUR neg 04/06/2022 1327   BILIRUBINUR  Negative 10/01/2021 1630   KETONESUR negative 11/22/2021 1328   KETONESUR NEGATIVE 11/17/2019 1600   PROTEINUR Negative 04/06/2022 1327   PROTEINUR 1+ (A) 10/01/2021 1630   PROTEINUR NEGATIVE 11/17/2019 1600   UROBILINOGEN 0.2 04/06/2022 1327   UROBILINOGEN 0.2 02/02/2013 1620   NITRITE neg 04/06/2022 1327   NITRITE Negative 10/01/2021 1630   NITRITE POSITIVE (A) 11/17/2019 1600   LEUKOCYTESUR Large (3+) (A) 04/06/2022 1327   LEUKOCYTESUR 1+ (A) 10/01/2021 1630  LEUKOCYTESUR LARGE (A) 11/17/2019 1600    Lab Results  Component Value Date   LABMICR See below: 10/01/2021   WBCUA 6-10 (A) 10/01/2021   LABEPIT 0-10 10/01/2021   MUCUS Present 10/01/2021   BACTERIA Moderate (A) 10/01/2021    Pertinent Imaging: *** Results for orders placed during the hospital encounter of 04/06/22  Abdomen 1 view (KUB)  Narrative CLINICAL DATA:  History of kidney stones.  No current pain.  EXAM: ABDOMEN - 1 VIEW  COMPARISON:  September 28, 2021  FINDINGS: The bowel gas pattern is normal. Bilateral kidney stones are identified, stable compared prior exam, largest in the lower pole left kidney measuring 8 mm. Prior cholecystectomy clips are noted.  IMPRESSION: Bilateral kidney stones, stable compared prior exam.   Electronically Signed By: Sherian Rein M.D. On: 04/07/2022 13:09  No results found for this or any previous visit.  No results found for this or any previous visit.  No results found for this or any previous visit.  Results for orders placed during the hospital encounter of 09/01/17  US RENAL  Narrative CLINICAL DATA:  Kidney stones  EXAM: RENAL / URINARY TRACT ULTRASOUND COMPLETE  COMPARISON:  Abdominal ultrasound of August 03, 2017  FINDINGS: Right Kidney:  Length: 10.7 cm. There is cortical thinning diffusely. The cortical echotexture remains lower than that of the adjacent liver. There is a nonobstructing mid/upper pole stone measuring approximately 7  mm in diameter. There is no hydronephrosis.  Left Kidney:  Length: 11 cm. There is diffuse cortical thinning similar to that on the right. There is a mid to upper pole stone measuring 6 mm in diameter. There is no hydronephrosis.  Bladder:  Appears normal for degree of bladder distention. Bilateral ureteral jets are observed.  IMPRESSION: Bilateral nonobstructing kidney stones. Diffuse renal cortical thinning. No hydronephrosis. Normal appearing urinary bladder.   Electronically Signed By: David  [place] M.D. On: 09/01/2017 10:23  No valid procedures specified. No results found for this or any previous visit.  No results found for this or any previous visit.   Assessment & Plan:    1. Renal calculus *** - Urinalysis, Routine w reflex microscopic  2. Chronic interstitial cystitis ***   No follow-ups on file.  Wilkie Aye, MD  [facility] [place]

## 2022-10-06 ENCOUNTER — Encounter: Payer: Self-pay | Admitting: Urology

## 2022-10-06 MED ORDER — NITROFURANTOIN MACROCRYSTAL 50 MG PO CAPS: 50.0000 mg | ORAL_CAPSULE | Freq: Every day | ORAL | 3 refills | Status: DC

## 2022-10-15 DIAGNOSIS — Z1283 Encounter for screening for malignant neoplasm of skin: Secondary | ICD-10-CM | POA: Diagnosis not present

## 2022-10-15 DIAGNOSIS — D225 Melanocytic nevi of trunk: Secondary | ICD-10-CM | POA: Diagnosis not present

## 2022-10-28 DIAGNOSIS — E663 Overweight: Secondary | ICD-10-CM | POA: Diagnosis not present

## 2022-10-28 DIAGNOSIS — Z6829 Body mass index (BMI) 29.0-29.9, adult: Secondary | ICD-10-CM | POA: Diagnosis not present

## 2022-10-28 DIAGNOSIS — M5126 Other intervertebral disc displacement, lumbar region: Secondary | ICD-10-CM | POA: Diagnosis not present

## 2022-10-28 DIAGNOSIS — I10 Essential (primary) hypertension: Secondary | ICD-10-CM | POA: Diagnosis not present

## 2022-10-28 DIAGNOSIS — G894 Chronic pain syndrome: Secondary | ICD-10-CM | POA: Diagnosis not present

## 2022-10-28 DIAGNOSIS — M5010 Cervical disc disorder with radiculopathy, unspecified cervical region: Secondary | ICD-10-CM | POA: Diagnosis not present

## 2022-10-28 DIAGNOSIS — L4050 Arthropathic psoriasis, unspecified: Secondary | ICD-10-CM | POA: Diagnosis not present

## 2022-10-28 DIAGNOSIS — K219 Gastro-esophageal reflux disease without esophagitis: Secondary | ICD-10-CM | POA: Diagnosis not present

## 2022-11-13 DIAGNOSIS — K219 Gastro-esophageal reflux disease without esophagitis: Secondary | ICD-10-CM | POA: Diagnosis not present

## 2022-11-13 DIAGNOSIS — I10 Essential (primary) hypertension: Secondary | ICD-10-CM | POA: Diagnosis not present

## 2023-01-03 ENCOUNTER — Ambulatory Visit
Admission: RE | Admit: 2023-01-03 | Discharge: 2023-01-03 | Disposition: A | Discharge status: Home / Self Care | Payer: Medicare HMO | Source: Ambulatory Visit

## 2023-01-03 VITALS — BP 128/65 | HR 83 | Temp 97.8°F | Resp 16

## 2023-01-03 DIAGNOSIS — J0140 Acute pansinusitis, unspecified: Secondary | ICD-10-CM

## 2023-01-03 MED ORDER — AMOXICILLIN-POT CLAVULANATE 875-125 MG PO TABS: 1.0000 | ORAL_TABLET | Freq: Two times a day (BID) | ORAL | 0 refills | Status: DC

## 2023-01-03 NOTE — ED Triage Notes (Signed)
Nasal congestion with pain and pressure x 2 weeks.  Left ear pain and headache.  Has tried sudafed with some relief and flonase.

## 2023-01-03 NOTE — ED Provider Notes (Signed)
[facility]    CSN: [id number] Arrival date & time: 01/03/23  1242      History   Chief Complaint Chief Complaint  Patient presents with   Headache    I have had a bad sinus headache for over a wk now. Lots of pressure, congestion in sinus cavities. Also left ear pain / pressure. - Entered by patient    HPI Bethany King is a 52 y.o. female.   The history is provided by the patient.   The patient presents for complaints of headache, sinus pressure, nasal congestion, and bilateral ear fullness and pressure.  Symptoms have been present for the past 2 weeks.  Patient reports low-grade fever.  She denies sore throat, ear drainage, wheezing, shortness of breath, difficulty breathing, chest pain, abdominal pain, nausea, vomiting, or diarrhea.  Patient reports she has taken Sudafed, use Flonase, Tylenol, for symptoms with some relief.  Past Medical History:  Diagnosis Date   Allergic rhinitis    Anxiety    Arthritis    ASCUS of cervix with negative high risk HPV 04/20/2022   04/20/22 repeat in 3 years per ASCCP guidelines, 5 year risk CIN 3+ is 0.27%    Bladder pain    Complication of anesthesia    Costochondritis    hx   DDD (degenerative disc disease), lumbar    Depression    GERD (gastroesophageal reflux disease)    Hepatic hemangioma 2006   stable   History of adenomatous polyp of colon    History of colitis    History of gastritis    History of kidney stones    HTN (hypertension)    Irritable bowel syndrome (IBS)    Medullary sponge kidney    left   Migraine    Nephrolithiasis    bilateral-- nonobstructive per CT   PONV (postoperative nausea and vomiting)    Psoriatic arthritis (HCC)    Psoriatic arthritis (HCC)    Spinal stenosis    Urgency of urination     Patient Active Problem List   Diagnosis Date Noted   ASCUS of cervix with negative high risk HPV 04/20/2022   Early satiety 01/06/2022   Hematochezia 01/06/2022   Bloating 01/06/2022    Hemorrhoids 12/09/2021   Rectal itching 12/09/2021   Overactive bladder 09/09/2020   Encounter for screening fecal occult blood testing 04/09/2020   Encounter for well woman exam with routine gynecological exam 04/09/2020   Hormone replacement therapy (HRT) 04/09/2020   Pelvic pain in female 04/05/2020   Urinary urgency 04/05/2020   Vaginal dryness 02/27/2020   Burning with urination 02/27/2020   Vaginal burning 02/27/2020   Chronic interstitial cystitis 02/09/2020   Menopause 04/04/2019   Screening for colorectal cancer 04/04/2019   Encounter for gynecological examination with Papanicolaou smear of cervix 04/04/2019   Routine cervical smear 04/04/2019   Vaginal dryness, menopausal 04/04/2019   Hot flashes 04/04/2019   Psoriatic arthritis (HCC) 12/26/2018   History of colonic polyps 12/26/2018   Pain of upper abdomen 12/26/2018   Abdominal pain 06/23/2014   Acute gastroenteritis 06/23/2014   Back pain with radiation 10/24/2012   Breast mass 10/09/2012   Seasonal allergies 09/22/2012   Hypokalemia 09/22/2012   Dysmenorrhea 08/30/2012   Sciatica 05/29/2012   Sinusitis 03/02/2012   Back pain 02/10/2012   Menorrhagia 12/30/2011   Shoulder pain, left 12/03/2011   Costochondritis 11/17/2011   Shoulder bursitis 11/17/2011   Recurrent UTI 10/09/2011   Migraine headache 10/08/2011   Obesity 09/04/2011  HTN (hypertension) 07/14/2011   Anxiety 07/14/2011   Diarrhea 05/19/2011   Chest pain 04/30/2011   IBS (irritable bowel syndrome) 09/23/2010   Epigastric pain 09/23/2010   CONSTIPATION 01/29/2010   Gastroesophageal reflux disease 01/24/2010    Past Surgical History:  Procedure Laterality Date   ANTERIOR CERVICAL DECOMP/DISCECTOMY FUSION  02/26/2014   C5 - C6; fusion with plate   BIOPSY  01/04/2019   Procedure: BIOPSY;  Surgeon: Malissa Hippo, MD;  [facility]: [facility];  Service: Endoscopy;;  gastric polyps   BIOPSY  02/10/2022   Procedure: BIOPSY;  Surgeon: Dolores Frame, MD;  [facility]: [facility];  Service: Gastroenterology;;   Fidela Salisbury RELEASE Bilateral right 04-01-2010/  left 05-30-2010   CHOLECYSTECTOMY N/A 07/14/2013   Procedure: LAPAROSCOPIC CHOLECYSTECTOMY;  Surgeon: Dalia Heading, MD;  Location: [facility];  Service: General;  Laterality: N/A;   COLONOSCOPY  10-13-2010   COLONOSCOPY N/A 01/04/2019   Procedure: COLONOSCOPY;  Surgeon: Malissa Hippo, MD;  [facility]: [facility];  Service: Endoscopy;  Laterality: N/A;   COLONOSCOPY WITH PROPOFOL N/A 02/10/2022   Procedure: COLONOSCOPY WITH PROPOFOL;  Surgeon: Dolores Frame, MD;  [facility]: [facility];  Service: Gastroenterology;  Laterality: N/A;  915 ASA 3   CYSTO WITH HYDRODISTENSION N/A 04/02/2015   Procedure: CYSTOSCOPY/HYDRODISTENSION, BLADDER BIOPSY WITH FULGURATION;  Surgeon: Bjorn Pippin, MD;  Location: [facility];  Service: Urology;  Laterality: N/A;   CYSTO WITH HYDRODISTENSION N/A 03/25/2018   Procedure: CYSTOSCOPY/HYDRODISTENSION, INSTILL MARCAIN AND PYRIDIUM;  Surgeon: Bjorn Pippin, MD;  Location: [facility];  Service: Urology;  Laterality: N/A;   CYSTO WITH HYDRODISTENSION N/A 01/15/2020   Procedure: CYSTOSCOPY/HYDRODISTENSION;  Surgeon: Malen Gauze, MD;  Location: [facility];  Service: Urology;  Laterality: N/A;   CYSTOSCOPY W/ URETERAL STENT PLACEMENT Bilateral 04/08/2016   Procedure: CYSTOSCOPY WITH BILATERAL RETROGRADE PYELOGRAM, BILATERAL URETERAL STENT PLACEMENT;  Surgeon: Malen Gauze, MD;  Location: [facility];  Service: Urology;  Laterality: Bilateral;   CYSTOSCOPY W/ URETERAL STENT PLACEMENT Left 04/15/2016   Procedure: CYSTOSCOPY WITH RETROGRADE PYELOGRAM/URETERAL STENT PLACEMENT;  Surgeon: Malen Gauze, MD;  Location: [facility];  Service: Urology;  Laterality: Left;   CYSTOSCOPY WITH BIOPSY N/A 01/15/2020   Procedure: CYSTOSCOPY WITH BLADDER BIOPSY AND FULGERATION;  Surgeon: Malen Gauze, MD;  Location: [facility];  Service:  Urology;  Laterality: N/A;   CYSTOSCOPY WITH HOLMIUM LASER LITHOTRIPSY Right 04/08/2016   Procedure: CYSTOSCOPY WITH RIGHT RENAL STONE EXTRACTION WITH HOLMIUM LASER LITHOTRIPSY;  Surgeon: Malen Gauze, MD;  Location: [facility];  Service: Urology;  Laterality: Right;   CYSTOSCOPY WITH HOLMIUM LASER LITHOTRIPSY Left 04/15/2016   Procedure: CYSTOSCOPY WITH HOLMIUM LASER LITHOTRIPSY;  Surgeon: Malen Gauze, MD;  Location: [facility];  Service: Urology;  Laterality: Left;   CYSTOSCOPY WITH RETROGRADE PYELOGRAM, URETEROSCOPY AND STENT PLACEMENT Left 07/05/2017   Procedure: CYSTOSCOPY WITH RETROGRADE PYELOGRAM, URETEROSCOPY AND STENT PLACEMENT;  Surgeon: Malen Gauze, MD;  Location: [facility];  Service: Urology;  Laterality: Left;   DIAGNOSTIC LAPAROSCOPY     DILATION AND CURETTAGE OF UTERUS     DILITATION & CURRETTAGE/HYSTROSCOPY WITH THERMACHOICE ABLATION N/A 09/07/2012   Procedure: DILATATION & CURETTAGE/HYSTEROSCOPY WITH THERMACHOICE ABLATION;  Surgeon: Lazaro Arms, MD;  Location: [facility];  Service: Gynecology;  Laterality: N/A;   ESOPHAGOGASTRODUODENOSCOPY  10/14/10   ESOPHAGOGASTRODUODENOSCOPY N/A 01/04/2019   Procedure: ESOPHAGOGASTRODUODENOSCOPY (EGD);  Surgeon: Malissa Hippo, MD;  [facility];  Service: Endoscopy;  Laterality:  N/A;  9:30   ESOPHAGOGASTRODUODENOSCOPY (EGD) WITH PROPOFOL N/A 02/10/2022   Procedure: ESOPHAGOGASTRODUODENOSCOPY (EGD) WITH PROPOFOL;  Surgeon: Dolores Frame, MD;  [facility];  Service: Gastroenterology;  Laterality: N/A;   EXTRACORPOREAL SHOCK WAVE LITHOTRIPSY  multiple   HOLMIUM LASER APPLICATION Left 07/05/2017   Procedure: HOLMIUM LASER APPLICATION;  Surgeon: Malen Gauze, MD;  Location: [facility];  Service: Urology;  Laterality: Left;   PERCUTANEOUS NEPHROSTOLITHOTOMY  2003   PLANTAR FASCIA SURGERY Bilateral right 2008//  left 2010   POLYPECTOMY  01/04/2019   Procedure: POLYPECTOMY;  Surgeon: Malissa Hippo, MD;   [facility];  Service: Endoscopy;;  colon   STONE EXTRACTION WITH BASKET Left 04/15/2016   Procedure: STONE EXTRACTION WITH BASKET;  Surgeon: Malen Gauze, MD;  Location: [facility];  Service: Urology;  Laterality: Left;    OB History     Gravida  0   Para      Term      Preterm      AB      Living  0      SAB      IAB      Ectopic      Multiple      Live Births               Home Medications    Prior to Admission medications   Medication Sig Start Date End Date Taking? Authorizing Provider  amoxicillin-clavulanate (AUGMENTIN) 875-125 MG tablet Take 1 tablet by mouth every 12 (twelve) hours. 01/03/23  Yes Rumi Kolodziej-Warren, Sadie Haber, NP  risankizumab-rzaa [facility]) 150 MG/ML SOSY prefilled syringe Inject into the skin. Every 3 months   Yes [provider]  ALPRAZolam (XANAX) 0.5 MG tablet Take 0.5 mg by mouth 2 (two) times daily as needed for anxiety. 11/10/19   [provider]  Cholecalciferol (VITAMIN D3) 125 MCG (5000 UT) CAPS Take 5,000 Units by mouth daily.    [provider]  DULoxetine (CYMBALTA) 60 MG capsule Take 60 mg by mouth daily.    [provider]  estradiol (ESTRACE) 1 MG tablet TAKE 1 TABLET(1 MG) BY MOUTH DAILY 05/18/22   Adline Potter, NP  fluticasone (FLONASE) 50 MCG/ACT nasal spray Place 1 spray into both nostrils daily as needed for allergies or rhinitis.    [provider]  HYDROcodone-acetaminophen (NORCO) 10-325 MG tablet Take 1 tablet by mouth 4 (four) times daily as needed for moderate pain. Patient taking differently: Take 1-2 tablets by mouth every 6 (six) hours as needed for moderate pain. 01/15/20   McKenzie, Mardene Celeste, MD  hydrOXYzine (ATARAX) 10 MG tablet Take 1 tablet (10 mg total) by mouth at bedtime. 10/05/22   McKenzie, Mardene Celeste, MD  pentosan polysulfate (ELMIRON) 100 MG capsule Take 1 capsule (100 mg total) by mouth daily. 10/05/22   McKenzie, Mardene Celeste, MD  potassium  chloride SA (K-DUR,KLOR-CON) 20 MEQ tablet Take 20 mEq by mouth 2 (two) times daily.    [provider]  progesterone (PROMETRIUM) 200 MG capsule TAKE 1 CAPSULE(200 MG) BY MOUTH AT BEDTIME 03/20/22   Cyril Mourning A, NP  triamterene-hydrochlorothiazide (DYAZIDE) 37.5-25 MG capsule Take 1 capsule by mouth daily.  01/09/18   [provider]  Adalimumab (HUMIRA PEN) 40 MG/0.4ML PNKT Inject 40 mg into the skin every 14 (fourteen) days. Twice a month   08/09/19  [provider]  pantoprazole (PROTONIX) 40 MG tablet Take 1 tablet (40 mg total) by mouth  daily before breakfast. 01/04/19 08/09/19  Malissa Hippo, MD    Family History Family History  Problem Relation Age of Onset   Irritable bowel syndrome Mother    Scleroderma Mother    Rheum arthritis Mother    Asthma Mother    Melanoma Father    Cancer Father        Melanoma   Colon cancer Cousin 79       second cousin Nettie Elm Lovelace)   Cancer Maternal Grandmother        Ovarian   Diabetes Maternal Grandmother    Thyroid disease Maternal Aunt     Social History Social History   Tobacco Use   Smoking status: Never    Passive exposure: Never   Smokeless tobacco: Never  Vaping Use   Vaping status: Never Used  Substance Use Topics   Alcohol use: No   Drug use: No     Allergies   Hydromorphone, Prednisone, Tramadol, and Sulfa antibiotics   Review of Systems Review of Systems Per HPI  Physical Exam Triage Vital Signs ED Triage Vitals  Encounter Vitals Group     BP 01/03/23 1247 128/65     Systolic BP Percentile --      Diastolic BP Percentile --      Pulse Rate 01/03/23 1247 83     Resp 01/03/23 1247 16     Temp 01/03/23 1247 97.8 F (36.6 C)     Temp Source 01/03/23 1247 Oral     SpO2 01/03/23 1247 97 %     Weight --      Height --      Head Circumference --      Peak Flow --      Pain Score 01/03/23 1248 5     Pain Loc --      Pain Education --      Exclude from Growth Chart --     No data found.  Updated Vital Signs BP 128/65 (BP Location: Right Arm)   Pulse 83   Temp 97.8 F (36.6 C) (Oral)   Resp 16   SpO2 97%   Visual Acuity Right Eye Distance:   Left Eye Distance:   Bilateral Distance:    Right Eye Near:   Left Eye Near:    Bilateral Near:     Physical Exam Vitals and nursing note reviewed.  Constitutional:      General: She is not in acute distress.    Appearance: Normal appearance.  HENT:     Head: Normocephalic.     Right Ear: Tympanic membrane, ear canal and external ear normal.     Left Ear: Tympanic membrane, ear canal and external ear normal.     Nose: Congestion present. No rhinorrhea.     Right Turbinates: Enlarged and swollen.     Left Turbinates: Enlarged and swollen.     Right Sinus: Maxillary sinus tenderness and frontal sinus tenderness present.     Left Sinus: Maxillary sinus tenderness and frontal sinus tenderness present.     Mouth/Throat:     Lips: Pink.     Mouth: Mucous membranes are moist.     Pharynx: Oropharynx is clear. Uvula midline. Posterior oropharyngeal erythema and postnasal drip present. No pharyngeal swelling, oropharyngeal exudate or uvula swelling.  Eyes:     Extraocular Movements: Extraocular movements intact.     Conjunctiva/sclera: Conjunctivae normal.     Pupils: Pupils are equal, round, and reactive to light.  Cardiovascular:  Rate and Rhythm: Normal rate and regular rhythm.     Pulses: Normal pulses.     Heart sounds: Normal heart sounds.  Pulmonary:     Effort: Pulmonary effort is normal. No respiratory distress.     Breath sounds: Normal breath sounds. No stridor. No wheezing, rhonchi or rales.  Abdominal:     General: Bowel sounds are normal.     Palpations: Abdomen is soft.     Tenderness: There is no abdominal tenderness.  Musculoskeletal:     Cervical back: Normal range of motion.  Lymphadenopathy:     Cervical: No cervical adenopathy.  Skin:    General: Skin is warm and dry.   Neurological:     General: No focal deficit present.     Mental Status: She is alert and oriented to person, place, and time.  Psychiatric:        Mood and Affect: Mood normal.        Behavior: Behavior normal.      UC Treatments / Results  Labs (all labs ordered are listed, but only abnormal results are displayed) Labs Reviewed - No data to display  EKG   Radiology No results found.  Procedures Procedures (including critical care time)  Medications Ordered in UC Medications - No data to display  Initial Impression / Assessment and Plan / UC Course  I have reviewed the triage vital signs and the nursing notes.  Pertinent labs & imaging results that were available during my care of the patient were reviewed by me and considered in my medical decision making (see chart for details).  The patient is well-appearing, she is in no acute distress, vital signs are stable.  On exam, patient with moderate maxillary and frontal sinus tenderness.  Symptoms have been present for the past 2 weeks despite use of over-the-counter remedies.  Viral testing is not indicated based on the duration of the patient's symptoms.  Symptoms are consistent with acute pansinusitis.  Will treat with Augmentin 875/125 mg tablets.  Patient advised to continue Sudafed and Flonase as needed.  Supportive care recommendations were provided and discussed with the patient to include normal saline nasal spray, over-the-counter analgesics for pain or discomfort, and use of a humidifier in her bedroom at nighttime during sleep as needed for cough.  Patient advised to follow-up in this clinic or with her PCP if symptoms do not improve.  Patient is in agreement with this plan of care and verbalized understanding.  All questions were answered.  The patient is stable for discharge.  Final Clinical Impressions(s) / UC Diagnoses   Final diagnoses:  Acute pansinusitis, recurrence not specified     Discharge Instructions       Take medication as prescribed.  You may continue your current medications to include Sudafed and Flonase. Increase fluids and allow for plenty of rest. May take over-the-counter Tylenol or ibuprofen as needed for pain, fever, general discomfort. Normal saline nasal spray throughout the day to help with nasal congestion and runny nose. If your cough worsens, recommend using a humidifier at bedtime during sleep and sleeping elevated on pillows while cough symptoms persist. If symptoms are not improved after this treatment, please follow-up in this clinic or with your primary care physician for further evaluation. Follow-up as needed.      ED Prescriptions     Medication Sig Dispense Auth. Provider   amoxicillin-clavulanate (AUGMENTIN) 875-125 MG tablet Take 1 tablet by mouth every 12 (twelve) hours. 14 tablet Sachit Gilman-Warren, Sadie Haber,  NP      PDMP not reviewed this encounter.   Abran Cantor, NP 01/03/23 1306

## 2023-01-03 NOTE — Discharge Instructions (Signed)
Take medication as prescribed.  You may continue your current medications to include Sudafed and Flonase. Increase fluids and allow for plenty of rest. May take over-the-counter Tylenol or ibuprofen as needed for pain, fever, general discomfort. Normal saline nasal spray throughout the day to help with nasal congestion and runny nose. If your cough worsens, recommend using a humidifier at bedtime during sleep and sleeping elevated on pillows while cough symptoms persist. If symptoms are not improved after this treatment, please follow-up in this clinic or with your primary care physician for further evaluation. Follow-up as needed.

## 2023-01-15 DIAGNOSIS — J0100 Acute maxillary sinusitis, unspecified: Secondary | ICD-10-CM | POA: Diagnosis not present

## 2023-01-15 DIAGNOSIS — M5010 Cervical disc disorder with radiculopathy, unspecified cervical region: Secondary | ICD-10-CM | POA: Diagnosis not present

## 2023-01-15 DIAGNOSIS — K219 Gastro-esophageal reflux disease without esophagitis: Secondary | ICD-10-CM | POA: Diagnosis not present

## 2023-01-15 DIAGNOSIS — L4050 Arthropathic psoriasis, unspecified: Secondary | ICD-10-CM | POA: Diagnosis not present

## 2023-01-15 DIAGNOSIS — Z6828 Body mass index (BMI) 28.0-28.9, adult: Secondary | ICD-10-CM | POA: Diagnosis not present

## 2023-01-15 DIAGNOSIS — I10 Essential (primary) hypertension: Secondary | ICD-10-CM | POA: Diagnosis not present

## 2023-01-15 DIAGNOSIS — G894 Chronic pain syndrome: Secondary | ICD-10-CM | POA: Diagnosis not present

## 2023-01-15 DIAGNOSIS — M5126 Other intervertebral disc displacement, lumbar region: Secondary | ICD-10-CM | POA: Diagnosis not present

## 2023-02-09 DIAGNOSIS — L409 Psoriasis, unspecified: Secondary | ICD-10-CM | POA: Diagnosis not present

## 2023-02-09 DIAGNOSIS — M2550 Pain in unspecified joint: Secondary | ICD-10-CM | POA: Diagnosis not present

## 2023-02-09 DIAGNOSIS — Z79899 Other long term (current) drug therapy: Secondary | ICD-10-CM | POA: Diagnosis not present

## 2023-02-09 DIAGNOSIS — M79643 Pain in unspecified hand: Secondary | ICD-10-CM | POA: Diagnosis not present

## 2023-02-09 DIAGNOSIS — M1990 Unspecified osteoarthritis, unspecified site: Secondary | ICD-10-CM | POA: Diagnosis not present

## 2023-02-09 DIAGNOSIS — N3010 Interstitial cystitis (chronic) without hematuria: Secondary | ICD-10-CM | POA: Diagnosis not present

## 2023-02-09 DIAGNOSIS — L4050 Arthropathic psoriasis, unspecified: Secondary | ICD-10-CM | POA: Diagnosis not present

## 2023-02-09 DIAGNOSIS — M25579 Pain in unspecified ankle and joints of unspecified foot: Secondary | ICD-10-CM | POA: Diagnosis not present

## 2023-02-28 ENCOUNTER — Ambulatory Visit: Payer: Self-pay

## 2023-03-20 ENCOUNTER — Ambulatory Visit: Payer: Self-pay

## 2023-03-21 ENCOUNTER — Other Ambulatory Visit: Payer: Self-pay

## 2023-03-21 ENCOUNTER — Ambulatory Visit
Admission: RE | Admit: 2023-03-21 | Discharge: 2023-03-21 | Disposition: A | Discharge status: Home / Self Care | Payer: Medicare HMO | Source: Ambulatory Visit | Attending: Family Medicine | Admitting: Family Medicine

## 2023-03-21 VITALS — BP 141/87 | HR 96 | Temp 99.3°F | Resp 20

## 2023-03-21 DIAGNOSIS — J069 Acute upper respiratory infection, unspecified: Secondary | ICD-10-CM | POA: Diagnosis not present

## 2023-03-21 DIAGNOSIS — J4521 Mild intermittent asthma with (acute) exacerbation: Secondary | ICD-10-CM

## 2023-03-21 DIAGNOSIS — J309 Allergic rhinitis, unspecified: Secondary | ICD-10-CM | POA: Diagnosis not present

## 2023-03-21 MED ORDER — DEXAMETHASONE SODIUM PHOSPHATE 10 MG/ML IJ SOLN
10.0000 mg | Freq: Once | INTRAMUSCULAR | Status: AC
  Administered 2023-03-21: 10 mg via INTRAMUSCULAR

## 2023-03-21 MED ORDER — ALBUTEROL SULFATE HFA 108 (90 BASE) MCG/ACT IN AERS
2.0000 | INHALATION_SPRAY | RESPIRATORY_TRACT | 0 refills | Status: DC | PRN
Start: 2023-03-21 — End: 2023-09-24

## 2023-03-21 NOTE — ED Triage Notes (Addendum)
Pt reports body aches, low fever, headache x1 week. Pt reports nasal and chest congestion, bilateral ear pain/fullness with intermittent "popping" sensation,general malaise for last several days.

## 2023-03-21 NOTE — ED Provider Notes (Signed)
[facility]    CSN: [id number] Arrival date & time: 03/21/23  4098      History   Chief Complaint Chief Complaint  Patient presents with   Headache    Congestion, low fever, generalized body aches - Entered by patient    HPI Bethany King is a 52 y.o. female.   Patient presenting today with 1 week history of low-grade fevers, body aches, chills, cough, headache, fatigue and now the past few days having chest tightness, ear pressure after doing some yard work outside with the ragweed and mold bothering her allergies.  She denies chest pain, shortness of breath, abdominal pain, nausea vomiting or diarrhea.  Taking her daily Claritin for seasonal allergies, Flonase once daily and now has added Mucinex.  History of asthma not currently on any inhalers.    Past Medical History:  Diagnosis Date   Allergic rhinitis    Anxiety    Arthritis    ASCUS of cervix with negative high risk HPV 04/20/2022   04/20/22 repeat in 3 years per ASCCP guidelines, 5 year risk CIN 3+ is 0.27%    Bladder pain    Complication of anesthesia    Costochondritis    hx   DDD (degenerative disc disease), lumbar    Depression    GERD (gastroesophageal reflux disease)    Hepatic hemangioma 2006   stable   History of adenomatous polyp of colon    History of colitis    History of gastritis    History of kidney stones    HTN (hypertension)    Irritable bowel syndrome (IBS)    Medullary sponge kidney    left   Migraine    Nephrolithiasis    bilateral-- nonobstructive per CT   PONV (postoperative nausea and vomiting)    Psoriatic arthritis (HCC)    Psoriatic arthritis (HCC)    Spinal stenosis    Urgency of urination     Patient Active Problem List   Diagnosis Date Noted   ASCUS of cervix with negative high risk HPV 04/20/2022   Early satiety 01/06/2022   Hematochezia 01/06/2022   Bloating 01/06/2022   Hemorrhoids 12/09/2021   Rectal itching 12/09/2021   Overactive bladder  09/09/2020   Encounter for screening fecal occult blood testing 04/09/2020   Encounter for well woman exam with routine gynecological exam 04/09/2020   Hormone replacement therapy (HRT) 04/09/2020   Pelvic pain in female 04/05/2020   Urinary urgency 04/05/2020   Vaginal dryness 02/27/2020   Burning with urination 02/27/2020   Vaginal burning 02/27/2020   Chronic interstitial cystitis 02/09/2020   Menopause 04/04/2019   Screening for colorectal cancer 04/04/2019   Encounter for gynecological examination with Papanicolaou smear of cervix 04/04/2019   Routine cervical smear 04/04/2019   Vaginal dryness, menopausal 04/04/2019   Hot flashes 04/04/2019   Psoriatic arthritis (HCC) 12/26/2018   History of colonic polyps 12/26/2018   Pain of upper abdomen 12/26/2018   Abdominal pain 06/23/2014   Acute gastroenteritis 06/23/2014   Back pain with radiation 10/24/2012   Breast mass 10/09/2012   Seasonal allergies 09/22/2012   Hypokalemia 09/22/2012   Dysmenorrhea 08/30/2012   Sciatica 05/29/2012   Sinusitis 03/02/2012   Back pain 02/10/2012   Menorrhagia 12/30/2011   Shoulder pain, left 12/03/2011   Costochondritis 11/17/2011   Shoulder bursitis 11/17/2011   Recurrent UTI 10/09/2011   Migraine headache 10/08/2011   Obesity 09/04/2011   HTN (hypertension) 07/14/2011   Anxiety 07/14/2011   Diarrhea 05/19/2011  Chest pain 04/30/2011   IBS (irritable bowel syndrome) 09/23/2010   Epigastric pain 09/23/2010   Constipation 01/29/2010   Gastroesophageal reflux disease 01/24/2010    Past Surgical History:  Procedure Laterality Date   ANTERIOR CERVICAL DECOMP/DISCECTOMY FUSION  02/26/2014   C5 - C6; fusion with plate   BIOPSY  01/04/2019   Procedure: BIOPSY;  Surgeon: Malissa Hippo, MD;  [facility]: [facility];  Service: Endoscopy;;  gastric polyps   BIOPSY  02/10/2022   Procedure: BIOPSY;  Surgeon: Dolores Frame, MD;  [facility]: [facility];  Service:  Gastroenterology;;   Fidela Salisbury RELEASE Bilateral right 04-01-2010/  left 05-30-2010   CHOLECYSTECTOMY N/A 07/14/2013   Procedure: LAPAROSCOPIC CHOLECYSTECTOMY;  Surgeon: Dalia Heading, MD;  Location: [facility];  Service: General;  Laterality: N/A;   COLONOSCOPY  10-13-2010   COLONOSCOPY N/A 01/04/2019   Procedure: COLONOSCOPY;  Surgeon: Malissa Hippo, MD;  [facility];  Service: Endoscopy;  Laterality: N/A;   COLONOSCOPY WITH PROPOFOL N/A 02/10/2022   Procedure: COLONOSCOPY WITH PROPOFOL;  Surgeon: Dolores Frame, MD;  [facility];  Service: Gastroenterology;  Laterality: N/A;  915 ASA 3   CYSTO WITH HYDRODISTENSION N/A 04/02/2015   Procedure: CYSTOSCOPY/HYDRODISTENSION, BLADDER BIOPSY WITH FULGURATION;  Surgeon: Bjorn Pippin, MD;  Location: [facility];  Service: Urology;  Laterality: N/A;   CYSTO WITH HYDRODISTENSION N/A 03/25/2018   Procedure: CYSTOSCOPY/HYDRODISTENSION, INSTILL MARCAIN AND PYRIDIUM;  Surgeon: Bjorn Pippin, MD;  Location: [facility];  Service: Urology;  Laterality: N/A;   CYSTO WITH HYDRODISTENSION N/A 01/15/2020   Procedure: CYSTOSCOPY/HYDRODISTENSION;  Surgeon: Malen Gauze, MD;  Location: [facility];  Service: Urology;  Laterality: N/A;   CYSTOSCOPY W/ URETERAL STENT PLACEMENT Bilateral 04/08/2016   Procedure: CYSTOSCOPY WITH BILATERAL RETROGRADE PYELOGRAM, BILATERAL URETERAL STENT PLACEMENT;  Surgeon: Malen Gauze, MD;  Location: [facility];  Service: Urology;  Laterality: Bilateral;   CYSTOSCOPY W/ URETERAL STENT PLACEMENT Left 04/15/2016   Procedure: CYSTOSCOPY WITH RETROGRADE PYELOGRAM/URETERAL STENT PLACEMENT;  Surgeon: Malen Gauze, MD;  Location: [facility];  Service: Urology;  Laterality: Left;   CYSTOSCOPY WITH BIOPSY N/A 01/15/2020   Procedure: CYSTOSCOPY WITH BLADDER BIOPSY AND FULGERATION;  Surgeon: Malen Gauze, MD;  Location: [facility];  Service: Urology;  Laterality: N/A;   CYSTOSCOPY WITH HOLMIUM LASER  LITHOTRIPSY Right 04/08/2016   Procedure: CYSTOSCOPY WITH RIGHT RENAL STONE EXTRACTION WITH HOLMIUM LASER LITHOTRIPSY;  Surgeon: Malen Gauze, MD;  Location: [facility];  Service: Urology;  Laterality: Right;   CYSTOSCOPY WITH HOLMIUM LASER LITHOTRIPSY Left 04/15/2016   Procedure: CYSTOSCOPY WITH HOLMIUM LASER LITHOTRIPSY;  Surgeon: Malen Gauze, MD;  Location: [facility];  Service: Urology;  Laterality: Left;   CYSTOSCOPY WITH RETROGRADE PYELOGRAM, URETEROSCOPY AND STENT PLACEMENT Left 07/05/2017   Procedure: CYSTOSCOPY WITH RETROGRADE PYELOGRAM, URETEROSCOPY AND STENT PLACEMENT;  Surgeon: Malen Gauze, MD;  Location: [facility];  Service: Urology;  Laterality: Left;   DIAGNOSTIC LAPAROSCOPY     DILATION AND CURETTAGE OF UTERUS     DILITATION & CURRETTAGE/HYSTROSCOPY WITH THERMACHOICE ABLATION N/A 09/07/2012   Procedure: DILATATION & CURETTAGE/HYSTEROSCOPY WITH THERMACHOICE ABLATION;  Surgeon: Lazaro Arms, MD;  Location: [facility];  Service: Gynecology;  Laterality: N/A;   ESOPHAGOGASTRODUODENOSCOPY  10/14/10   ESOPHAGOGASTRODUODENOSCOPY N/A 01/04/2019   Procedure: ESOPHAGOGASTRODUODENOSCOPY (EGD);  Surgeon: Malissa Hippo, MD;  [facility];  Service: Endoscopy;  Laterality: N/A;  9:30   ESOPHAGOGASTRODUODENOSCOPY (EGD) WITH PROPOFOL N/A 02/10/2022  Procedure: ESOPHAGOGASTRODUODENOSCOPY (EGD) WITH PROPOFOL;  Surgeon: Dolores Frame, MD;  [facility];  Service: Gastroenterology;  Laterality: N/A;   EXTRACORPOREAL SHOCK WAVE LITHOTRIPSY  multiple   HOLMIUM LASER APPLICATION Left 07/05/2017   Procedure: HOLMIUM LASER APPLICATION;  Surgeon: Malen Gauze, MD;  Location: [facility];  Service: Urology;  Laterality: Left;   PERCUTANEOUS NEPHROSTOLITHOTOMY  2003   PLANTAR FASCIA SURGERY Bilateral right 2008//  left 2010   POLYPECTOMY  01/04/2019   Procedure: POLYPECTOMY;  Surgeon: Malissa Hippo, MD;  [facility];  Service: Endoscopy;;  colon   STONE  EXTRACTION WITH BASKET Left 04/15/2016   Procedure: STONE EXTRACTION WITH BASKET;  Surgeon: Malen Gauze, MD;  Location: [facility];  Service: Urology;  Laterality: Left;    OB History     Gravida  0   Para      Term      Preterm      AB      Living  0      SAB      IAB      Ectopic      Multiple      Live Births               Home Medications    Prior to Admission medications   Medication Sig Start Date End Date Taking? Authorizing Provider  albuterol (VENTOLIN HFA) 108 (90 Base) MCG/ACT inhaler Inhale 2 puffs into the lungs every 4 (four) hours as needed. 03/21/23  Yes Particia Nearing, PA-C  ALPRAZolam Prudy Feeler) 0.5 MG tablet Take 0.5 mg by mouth 2 (two) times daily as needed for anxiety. 11/10/19   [provider]  amoxicillin-clavulanate (AUGMENTIN) 875-125 MG tablet Take 1 tablet by mouth every 12 (twelve) hours. 01/03/23   Leath-Warren, Sadie Haber, NP  Cholecalciferol (VITAMIN D3) 125 MCG (5000 UT) CAPS Take 5,000 Units by mouth daily.    [provider]  DULoxetine (CYMBALTA) 60 MG capsule Take 60 mg by mouth daily.    [provider]  estradiol (ESTRACE) 1 MG tablet TAKE 1 TABLET(1 MG) BY MOUTH DAILY 05/18/22   Adline Potter, NP  fluticasone (FLONASE) 50 MCG/ACT nasal spray Place 1 spray into both nostrils daily as needed for allergies or rhinitis.    [provider]  HYDROcodone-acetaminophen (NORCO) 10-325 MG tablet Take 1 tablet by mouth 4 (four) times daily as needed for moderate pain. Patient taking differently: Take 1-2 tablets by mouth every 6 (six) hours as needed for moderate pain. 01/15/20   McKenzie, Mardene Celeste, MD  hydrOXYzine (ATARAX) 10 MG tablet Take 1 tablet (10 mg total) by mouth at bedtime. 10/05/22   McKenzie, Mardene Celeste, MD  pentosan polysulfate (ELMIRON) 100 MG capsule Take 1 capsule (100 mg total) by mouth daily. 10/05/22   McKenzie, Mardene Celeste, MD  potassium chloride SA (K-DUR,KLOR-CON) 20 MEQ  tablet Take 20 mEq by mouth 2 (two) times daily.    [provider]  progesterone (PROMETRIUM) 200 MG capsule TAKE 1 CAPSULE(200 MG) BY MOUTH AT BEDTIME 03/20/22   Adline Potter, NP  risankizumab-rzaa [facility]) 150 MG/ML SOSY prefilled syringe Inject into the skin. Every 3 months    [provider]  triamterene-hydrochlorothiazide (DYAZIDE) 37.5-25 MG capsule Take 1 capsule by mouth daily.  01/09/18   [provider]  Adalimumab (HUMIRA PEN) 40 MG/0.4ML PNKT Inject 40 mg into the skin every 14 (fourteen) days. Twice a month   08/09/19  [provider]  pantoprazole (PROTONIX) 40 MG tablet Take 1 tablet (40 mg total) by mouth daily before breakfast. 01/04/19 08/09/19  Malissa Hippo, MD    Family History Family History  Problem Relation Age of Onset   Irritable bowel syndrome Mother    Scleroderma Mother    Rheum arthritis Mother    Asthma Mother    Melanoma Father    Cancer Father        Melanoma   Colon cancer Cousin 62       second cousin Nettie Elm Lovelace)   Cancer Maternal Grandmother        Ovarian   Diabetes Maternal Grandmother    Thyroid disease Maternal Aunt     Social History Social History   Tobacco Use   Smoking status: Never    Passive exposure: Never   Smokeless tobacco: Never  Vaping Use   Vaping status: Never Used  Substance Use Topics   Alcohol use: No   Drug use: No     Allergies   Hydromorphone, Prednisone, Tramadol, and Sulfa antibiotics   Review of Systems Review of Systems Per HPI  Physical Exam Triage Vital Signs ED Triage Vitals  Encounter Vitals Group     BP 03/21/23 0951 (!) 141/87     Systolic BP Percentile --      Diastolic BP Percentile --      Pulse Rate 03/21/23 0951 96     Resp 03/21/23 0951 20     Temp 03/21/23 0951 99.3 F (37.4 C)     Temp Source 03/21/23 0951 Oral     SpO2 03/21/23 0951 99 %     Weight --      Height --      Head Circumference --      Peak Flow --      Pain  Score 03/21/23 0952 8     Pain Loc --      Pain Education --      Exclude from Growth Chart --    No data found.  Updated Vital Signs BP (!) 141/87 (BP Location: Right Arm)   Pulse 96   Temp 99.3 F (37.4 C) (Oral)   Resp 20   SpO2 99%   Visual Acuity Right Eye Distance:   Left Eye Distance:   Bilateral Distance:    Right Eye Near:   Left Eye Near:    Bilateral Near:     Physical Exam Vitals and nursing note reviewed.  Constitutional:      Appearance: Normal appearance.  HENT:     Head: Atraumatic.     Right Ear: Tympanic membrane and external ear normal.     Left Ear: Tympanic membrane and external ear normal.     Nose: Rhinorrhea present.     Mouth/Throat:     Mouth: Mucous membranes are moist.     Pharynx: Posterior oropharyngeal erythema present.  Eyes:     Extraocular Movements: Extraocular movements intact.     Conjunctiva/sclera: Conjunctivae normal.  Cardiovascular:     Rate and Rhythm: Normal rate and regular rhythm.     Heart sounds: Normal heart sounds.  Pulmonary:     Effort: Pulmonary effort is normal.     Breath sounds: Normal breath sounds. No wheezing or rales.  Musculoskeletal:        General: Normal range of motion.     Cervical back: Normal range of motion and neck supple.  Skin:    General: Skin is warm and dry.  Neurological:     Mental Status: She is alert and oriented to person, place, and time.  Psychiatric:        Mood and Affect: Mood normal.        Thought Content: Thought content normal.      UC Treatments / Results  Labs (all labs ordered are listed, but only abnormal results are displayed) Labs Reviewed - No data to display  EKG   Radiology No results found.  Procedures Procedures (including critical care time)  Medications Ordered in UC Medications  dexamethasone (DECADRON) injection 10 mg (10 mg Intramuscular Given 03/21/23 1018)    Initial Impression / Assessment and Plan / UC Course  I have reviewed the  triage vital signs and the nursing notes.  Pertinent labs & imaging results that were available during my care of the patient were reviewed by me and considered in my medical decision making (see chart for details).     Vitals and exam overall reassuring today, suspect viral upper respiratory infection with additional allergy and asthma exacerbation.  Treat with IM Decadron as she has side effects with oral prednisone, albuterol inhaler, increasing allergy regimen to Flonase twice daily, Claritin daily and discussed safe over-the-counter medications and home care.  Return for worsening symptoms.  Final Clinical Impressions(s) / UC Diagnoses   Final diagnoses:  Allergic sinusitis  Viral URI with cough  Mild intermittent asthma with acute exacerbation     Discharge Instructions      Continue your allergy medication daily, increase your Flonase to twice daily, you may do warm saline sinus rinses and Coricidin HBP as needed.  We have given you a steroid shot today and have prescribed an albuterol inhaler to use every 4 hours as needed.    ED Prescriptions     Medication Sig Dispense Auth. Provider   albuterol (VENTOLIN HFA) 108 (90 Base) MCG/ACT inhaler Inhale 2 puffs into the lungs every 4 (four) hours as needed. 18 g Particia Nearing, [place]      PDMP not reviewed this encounter.   Particia Nearing, [place] 03/21/23 1045

## 2023-03-21 NOTE — Discharge Instructions (Signed)
Continue your allergy medication daily, increase your Flonase to twice daily, you may do warm saline sinus rinses and Coricidin HBP as needed.  We have given you a steroid shot today and have prescribed an albuterol inhaler to use every 4 hours as needed.

## 2023-03-26 ENCOUNTER — Ambulatory Visit
Admission: RE | Admit: 2023-03-26 | Discharge: 2023-03-26 | Disposition: A | Discharge status: Home / Self Care | Payer: Medicare HMO | Source: Ambulatory Visit | Attending: Nurse Practitioner | Admitting: Nurse Practitioner

## 2023-03-26 VITALS — BP 123/68 | HR 73 | Temp 98.3°F | Resp 14

## 2023-03-26 DIAGNOSIS — Z87442 Personal history of urinary calculi: Secondary | ICD-10-CM | POA: Insufficient documentation

## 2023-03-26 DIAGNOSIS — N3010 Interstitial cystitis (chronic) without hematuria: Secondary | ICD-10-CM | POA: Diagnosis not present

## 2023-03-26 DIAGNOSIS — R399 Unspecified symptoms and signs involving the genitourinary system: Secondary | ICD-10-CM | POA: Insufficient documentation

## 2023-03-26 DIAGNOSIS — R3915 Urgency of urination: Secondary | ICD-10-CM | POA: Diagnosis not present

## 2023-03-26 LAB — POCT URINALYSIS DIP (MANUAL ENTRY)
Bilirubin, UA: NEGATIVE
Glucose, UA: NEGATIVE mg/dL
Ketones, POC UA: NEGATIVE mg/dL
Nitrite, UA: NEGATIVE
Protein Ur, POC: NEGATIVE mg/dL
Spec Grav, UA: <=1.005 — AB (ref 1.010–1.025)
Urobilinogen, UA: 0.2 U/dL
pH, UA: 6 (ref 5.0–8.0)

## 2023-03-26 MED ORDER — CEPHALEXIN 500 MG PO CAPS: 500.0000 mg | ORAL_CAPSULE | Freq: Four times a day (QID) | ORAL | 0 refills | Status: AC

## 2023-03-26 NOTE — ED Provider Notes (Signed)
[facility]    CSN: [id number] Arrival date & time: 03/26/23  1020      History   Chief Complaint Chief Complaint  Patient presents with   Urinary Frequency    Burning urination, possible UTI - Entered by patient    HPI Bethany King is a 52 y.o. female.   The history is provided by the patient.   Patient presents for complaints of pain with urination, urinary urgency, low back pain, lower abdominal pain, and foul-smelling urine.  Symptoms have been present for the past 4 days.  Patient reports that she has had an underlying upper respiratory infection, and also endorses an underlying fever.  She denies chills, nausea, vomiting, diarrhea, hematuria, decreased urine stream, or vaginal symptoms.  Patient reports that she does have a history of underlying interstitial cystitis and kidney stones.  Patient reports that she has been instructed by her urologist to take Bactrim 50 mg if she begins to experience UTI symptoms.  Patient states that she did take medication, but has not noticed any change in her symptoms.  Patient reports her last UTI was earlier this year.  Past Medical History:  Diagnosis Date   Allergic rhinitis    Anxiety    Arthritis    ASCUS of cervix with negative high risk HPV 04/20/2022   04/20/22 repeat in 3 years per ASCCP guidelines, 5 year risk CIN 3+ is 0.27%    Bladder pain    Complication of anesthesia    Costochondritis    hx   DDD (degenerative disc disease), lumbar    Depression    GERD (gastroesophageal reflux disease)    Hepatic hemangioma 2006   stable   History of adenomatous polyp of colon    History of colitis    History of gastritis    History of kidney stones    HTN (hypertension)    Irritable bowel syndrome (IBS)    Medullary sponge kidney    left   Migraine    Nephrolithiasis    bilateral-- nonobstructive per CT   PONV (postoperative nausea and vomiting)    Psoriatic arthritis (HCC)    Psoriatic arthritis (HCC)     Spinal stenosis    Urgency of urination     Patient Active Problem List   Diagnosis Date Noted   ASCUS of cervix with negative high risk HPV 04/20/2022   Early satiety 01/06/2022   Hematochezia 01/06/2022   Bloating 01/06/2022   Hemorrhoids 12/09/2021   Rectal itching 12/09/2021   Overactive bladder 09/09/2020   Encounter for screening fecal occult blood testing 04/09/2020   Encounter for well woman exam with routine gynecological exam 04/09/2020   Hormone replacement therapy (HRT) 04/09/2020   Pelvic pain in female 04/05/2020   Urinary urgency 04/05/2020   Vaginal dryness 02/27/2020   Burning with urination 02/27/2020   Vaginal burning 02/27/2020   Chronic interstitial cystitis 02/09/2020   Menopause 04/04/2019   Screening for colorectal cancer 04/04/2019   Encounter for gynecological examination with Papanicolaou smear of cervix 04/04/2019   Routine cervical smear 04/04/2019   Vaginal dryness, menopausal 04/04/2019   Hot flashes 04/04/2019   Psoriatic arthritis (HCC) 12/26/2018   History of colonic polyps 12/26/2018   Pain of upper abdomen 12/26/2018   Abdominal pain 06/23/2014   Acute gastroenteritis 06/23/2014   Back pain with radiation 10/24/2012   Breast mass 10/09/2012   Seasonal allergies 09/22/2012   Hypokalemia 09/22/2012   Dysmenorrhea 08/30/2012   Sciatica 05/29/2012   Sinusitis  03/02/2012   Back pain 02/10/2012   Menorrhagia 12/30/2011   Shoulder pain, left 12/03/2011   Costochondritis 11/17/2011   Shoulder bursitis 11/17/2011   Recurrent UTI 10/09/2011   Migraine headache 10/08/2011   Obesity 09/04/2011   HTN (hypertension) 07/14/2011   Anxiety 07/14/2011   Diarrhea 05/19/2011   Chest pain 04/30/2011   IBS (irritable bowel syndrome) 09/23/2010   Epigastric pain 09/23/2010   Constipation 01/29/2010   Gastroesophageal reflux disease 01/24/2010    Past Surgical History:  Procedure Laterality Date   ANTERIOR CERVICAL DECOMP/DISCECTOMY FUSION   02/26/2014   C5 - C6; fusion with plate   BIOPSY  01/04/2019   Procedure: BIOPSY;  Surgeon: Malissa Hippo, MD;  [facility];  Service: Endoscopy;;  gastric polyps   BIOPSY  02/10/2022   Procedure: BIOPSY;  Surgeon: Dolores Frame, MD;  [facility];  Service: Gastroenterology;;   Fidela Salisbury RELEASE Bilateral right 04-01-2010/  left 05-30-2010   CHOLECYSTECTOMY N/A 07/14/2013   Procedure: LAPAROSCOPIC CHOLECYSTECTOMY;  Surgeon: Dalia Heading, MD;  Location: [facility];  Service: General;  Laterality: N/A;   COLONOSCOPY  10-13-2010   COLONOSCOPY N/A 01/04/2019   Procedure: COLONOSCOPY;  Surgeon: Malissa Hippo, MD;  [facility];  Service: Endoscopy;  Laterality: N/A;   COLONOSCOPY WITH PROPOFOL N/A 02/10/2022   Procedure: COLONOSCOPY WITH PROPOFOL;  Surgeon: Dolores Frame, MD;  [facility];  Service: Gastroenterology;  Laterality: N/A;  915 ASA 3   CYSTO WITH HYDRODISTENSION N/A 04/02/2015   Procedure: CYSTOSCOPY/HYDRODISTENSION, BLADDER BIOPSY WITH FULGURATION;  Surgeon: Bjorn Pippin, MD;  Location: [facility];  Service: Urology;  Laterality: N/A;   CYSTO WITH HYDRODISTENSION N/A 03/25/2018   Procedure: CYSTOSCOPY/HYDRODISTENSION, INSTILL MARCAIN AND PYRIDIUM;  Surgeon: Bjorn Pippin, MD;  Location: [facility];  Service: Urology;  Laterality: N/A;   CYSTO WITH HYDRODISTENSION N/A 01/15/2020   Procedure: CYSTOSCOPY/HYDRODISTENSION;  Surgeon: Malen Gauze, MD;  [facility]: [facility];  Service: Urology;  Laterality: N/A;   CYSTOSCOPY W/ URETERAL STENT PLACEMENT Bilateral 04/08/2016   Procedure: CYSTOSCOPY WITH BILATERAL RETROGRADE PYELOGRAM, BILATERAL URETERAL STENT PLACEMENT;  Surgeon: Malen Gauze, MD;  [facility]: [facility];  Service: Urology;  Laterality: Bilateral;   CYSTOSCOPY W/ URETERAL STENT PLACEMENT Left 04/15/2016   Procedure: CYSTOSCOPY WITH RETROGRADE PYELOGRAM/URETERAL STENT PLACEMENT;  Surgeon: Malen Gauze, MD;  [facility]: [facility];  Service: Urology;  Laterality: Left;   CYSTOSCOPY WITH BIOPSY N/A 01/15/2020   Procedure: CYSTOSCOPY WITH BLADDER BIOPSY AND FULGERATION;  Surgeon: Malen Gauze, MD;  [facility];  Service: Urology;  Laterality: N/A;   CYSTOSCOPY WITH HOLMIUM LASER LITHOTRIPSY Right 04/08/2016   Procedure: CYSTOSCOPY WITH RIGHT RENAL STONE EXTRACTION WITH HOLMIUM LASER LITHOTRIPSY;  Surgeon: Malen Gauze, MD;  [facility];  Service: Urology;  Laterality: Right;   CYSTOSCOPY WITH HOLMIUM LASER LITHOTRIPSY Left 04/15/2016   Procedure: CYSTOSCOPY WITH HOLMIUM LASER LITHOTRIPSY;  Surgeon: Malen Gauze, MD;  [facility];  Service: Urology;  Laterality: Left;   CYSTOSCOPY WITH RETROGRADE PYELOGRAM, URETEROSCOPY AND STENT PLACEMENT Left 07/05/2017   Procedure: CYSTOSCOPY WITH RETROGRADE PYELOGRAM, URETEROSCOPY AND STENT PLACEMENT;  Surgeon: Malen Gauze, MD;  [facility];  Service: Urology;  Laterality: Left;   DIAGNOSTIC LAPAROSCOPY     DILATION AND CURETTAGE OF UTERUS     DILITATION & CURRETTAGE/HYSTROSCOPY WITH THERMACHOICE ABLATION N/A 09/07/2012   Procedure: DILATATION & CURETTAGE/HYSTEROSCOPY WITH THERMACHOICE ABLATION;  Surgeon: Lazaro Arms, MD;  [facility];  Service: Gynecology;  Laterality: N/A;   ESOPHAGOGASTRODUODENOSCOPY  10/14/10   ESOPHAGOGASTRODUODENOSCOPY N/A 01/04/2019   Procedure: ESOPHAGOGASTRODUODENOSCOPY (EGD);  Surgeon: Malissa Hippo, MD;  [facility];  Service: Endoscopy;  Laterality: N/A;  9:30   ESOPHAGOGASTRODUODENOSCOPY (EGD) WITH PROPOFOL N/A 02/10/2022   Procedure: ESOPHAGOGASTRODUODENOSCOPY (EGD) WITH PROPOFOL;  Surgeon: Dolores Frame, MD;  [facility];  Service: Gastroenterology;  Laterality: N/A;   EXTRACORPOREAL SHOCK WAVE LITHOTRIPSY  multiple   HOLMIUM LASER APPLICATION Left 07/05/2017   Procedure: HOLMIUM LASER APPLICATION;  Surgeon: Malen Gauze, MD;  [facility];  Service: Urology;  Laterality: Left;   PERCUTANEOUS NEPHROSTOLITHOTOMY  2003   PLANTAR FASCIA SURGERY Bilateral right 2008//  left 2010   POLYPECTOMY  01/04/2019   Procedure: POLYPECTOMY;  Surgeon: Malissa Hippo, MD;  [facility];  Service: Endoscopy;;  colon   STONE EXTRACTION WITH BASKET Left 04/15/2016   Procedure: STONE EXTRACTION WITH BASKET;  Surgeon: Malen Gauze, MD;  Location[facility];  Service: Urology;  Laterality: Left;    OB History     Gravida  0   Para      Term      Preterm      AB      Living  0      SAB      IAB      Ectopic      Multiple      Live Births               Home Medications    Prior to Admission medications   Medication Sig Start Date End Date Taking? Authorizing Provider  cephALEXin (KEFLEX) 500 MG capsule Take 1 capsule (500 mg total) by mouth 4 (four) times daily for 7 days. 03/26/23 04/02/23 Yes Kino Dunsworth-Warren, Sadie Haber, NP  albuterol (VENTOLIN HFA) 108 (90 Base) MCG/ACT inhaler Inhale 2 puffs into the lungs every 4 (four) hours as needed. 03/21/23   Particia Nearing, PA-C  ALPRAZolam Prudy Feeler) 0.5 MG tablet Take 0.5 mg by mouth 2 (two) times daily as needed for anxiety. 11/10/19   [provider]  amoxicillin-clavulanate (AUGMENTIN) 875-125 MG tablet Take 1 tablet by mouth every 12 (twelve) hours. 01/03/23   Raylin Diguglielmo-Warren, Sadie Haber, NP  Cholecalciferol (VITAMIN D3) 125 MCG (5000 UT) CAPS Take 5,000 Units by mouth daily.    [provider]  DULoxetine (CYMBALTA) 60 MG capsule Take 60 mg by mouth daily.    [provider]  estradiol (ESTRACE) 1 MG tablet TAKE 1 TABLET(1 MG) BY MOUTH DAILY 05/18/22   Adline Potter, NP  fluticasone (FLONASE) 50 MCG/ACT nasal spray Place 1 spray into both nostrils daily as needed for allergies or rhinitis.    [provider]  HYDROcodone-acetaminophen (NORCO) 10-325 MG tablet Take 1 tablet by mouth 4 (four) times daily as needed for  moderate pain. Patient taking differently: Take 1-2 tablets by mouth every 6 (six) hours as needed for moderate pain. 01/15/20   McKenzie, Mardene Celeste, MD  hydrOXYzine (ATARAX) 10 MG tablet Take 1 tablet (10 mg total) by mouth at bedtime. 10/05/22   McKenzie, Mardene Celeste, MD  pentosan polysulfate (ELMIRON) 100 MG capsule Take 1 capsule (100 mg total) by mouth daily. 10/05/22   McKenzie, Mardene Celeste, MD  potassium chloride SA (K-DUR,KLOR-CON) 20 MEQ tablet Take 20 mEq by mouth 2 (two) times daily.    [provider]  progesterone (PROMETRIUM) 200 MG capsule  TAKE 1 CAPSULE(200 MG) BY MOUTH AT BEDTIME 03/20/22   Adline Potter, NP  risankizumab-rzaa [facility]) 150 MG/ML SOSY prefilled syringe Inject into the skin. Every 3 months    [provider]  triamterene-hydrochlorothiazide (DYAZIDE) 37.5-25 MG capsule Take 1 capsule by mouth daily.  01/09/18   [provider]  Adalimumab (HUMIRA PEN) 40 MG/0.4ML PNKT Inject 40 mg into the skin every 14 (fourteen) days. Twice a month   08/09/19  [provider]  pantoprazole (PROTONIX) 40 MG tablet Take 1 tablet (40 mg total) by mouth daily before breakfast. 01/04/19 08/09/19  Malissa Hippo, MD    Family History Family History  Problem Relation Age of Onset   Irritable bowel syndrome Mother    Scleroderma Mother    Rheum arthritis Mother    Asthma Mother    Melanoma Father    Cancer Father        Melanoma   Colon cancer Cousin 53       second cousin Nettie Elm Lovelace)   Cancer Maternal Grandmother        Ovarian   Diabetes Maternal Grandmother    Thyroid disease Maternal Aunt     Social History Social History   Tobacco Use   Smoking status: Never    Passive exposure: Never   Smokeless tobacco: Never  Vaping Use   Vaping status: Never Used  Substance Use Topics   Alcohol use: No   Drug use: No     Allergies   Hydromorphone, Prednisone, Tramadol, and Sulfa antibiotics   Review of Systems Review of  Systems Per HPI  Physical Exam Triage Vital Signs ED Triage Vitals  Encounter Vitals Group     BP 03/26/23 1058 123/68     Systolic BP Percentile --      Diastolic BP Percentile --      Pulse Rate 03/26/23 1058 73     Resp 03/26/23 1058 14     Temp 03/26/23 1058 98.3 F (36.8 C)     Temp Source 03/26/23 1058 Oral     SpO2 03/26/23 1058 97 %     Weight --      Height --      Head Circumference --      Peak Flow --      Pain Score 03/26/23 1057 8     Pain Loc --      Pain Education --      Exclude from Growth Chart --    No data found.  Updated Vital Signs BP 123/68 (BP Location: Right Arm)   Pulse 73   Temp 98.3 F (36.8 C) (Oral)   Resp 14   SpO2 97%   Visual Acuity Right Eye Distance:   Left Eye Distance:   Bilateral Distance:    Right Eye Near:   Left Eye Near:    Bilateral Near:     Physical Exam Vitals and nursing note reviewed.  Constitutional:      General: She is not in acute distress.    Appearance: Normal appearance.  HENT:     Head: Normocephalic.  Eyes:     Extraocular Movements: Extraocular movements intact.     Conjunctiva/sclera: Conjunctivae normal.     Pupils: Pupils are equal, round, and reactive to light.  Cardiovascular:     Rate and Rhythm: Regular rhythm.     Pulses: Normal pulses.     Heart sounds: Normal heart sounds.  Pulmonary:     Effort: Pulmonary effort is normal.  Breath sounds: Normal breath sounds.  Abdominal:     General: Bowel sounds are normal.     Palpations: Abdomen is soft.     Tenderness: There is abdominal tenderness in the suprapubic area. There is right CVA tenderness and left CVA tenderness (greater on left side).  Musculoskeletal:     Cervical back: Normal range of motion.  Lymphadenopathy:     Cervical: No cervical adenopathy.  Skin:    General: Skin is warm and dry.  Neurological:     General: No focal deficit present.     Mental Status: She is alert and oriented to person, place, and time.   Psychiatric:        Mood and Affect: Mood normal.        Behavior: Behavior normal.      UC Treatments / Results  Labs (all labs ordered are listed, but only abnormal results are displayed) Labs Reviewed  POCT URINALYSIS DIP (MANUAL ENTRY) - Abnormal; Notable for the following components:      Result Value   Spec Grav, UA <=1.005 (*)    Blood, UA trace-lysed (*)    Leukocytes, UA Small (1+) (*)    All other components within normal limits    EKG   Radiology No results found.  Procedures Procedures (including critical care time)  Medications Ordered in UC Medications - No data to display  Initial Impression / Assessment and Plan / UC Course  I have reviewed the triage vital signs and the nursing notes.  Pertinent labs & imaging results that were available during my care of the patient were reviewed by me and considered in my medical decision making (see chart for details).  Patient is well-appearing, she is in no acute distress, vital signs are stable.  Urinalysis shows small leukocytes, and trace blood.  Given the patient's current symptoms, and underlying history, we will treat empirically with Augmentin 875/125 mg tablets.  Urine culture is pending.  Supportive care recommendations were provided and discussed with the patient to include continuing to drink plenty of fluids, over-the-counter analgesics, avoiding caffeine, and developing a toileting schedule.  Patient was given strict ER follow-up precautions.  Patient was advised that if she continues to experience symptoms, and her urine culture is negative, she should follow-up with urology for further evaluation.  Patient is in agreement with this plan of care and verbalizes understanding.  All questions were answered.  Patient stable for discharge.  Final Clinical Impressions(s) / UC Diagnoses   Final diagnoses:  UTI symptoms  Chronic interstitial cystitis  History of kidney stones     Discharge Instructions       -Urine culture is pending.  You will be contacted if the pending test results is abnormal, or if the medication you are currently prescribed needs to be stopped or changed. -Increase fluids. -Continue Tylenol for pain, fever, or general discomfort. -Develop a toileting schedule that will allow you to toilet at least every 2 hours. -Avoid caffeine to include tea, soda, and coffee. -If sexually active, void at least 15 to 20 minutes after sexual intercourse. -Follow-up in the emergency department if you develop worsening urinary symptoms with fever, chills, worsening abdominal pain, or other concerns.  -If your urine culture is negative, and you are continuing to experience symptoms, please follow-up with your urologist for further evaluation. -Follow-up as needed.     ED Prescriptions     Medication Sig Dispense Auth. Provider   cephALEXin (KEFLEX) 500 MG capsule Take 1 capsule (  500 mg total) by mouth 4 (four) times daily for 7 days. 28 capsule Aksh Swart-Warren, Sadie Haber, NP      PDMP not reviewed this encounter.   Abran Cantor, NP 03/26/23 1132

## 2023-03-26 NOTE — Discharge Instructions (Signed)
-  Urine culture is pending.  You will be contacted if the pending test results is abnormal, or if the medication you are currently prescribed needs to be stopped or changed. -Increase fluids. -Continue Tylenol for pain, fever, or general discomfort. -Develop a toileting schedule that will allow you to toilet at least every 2 hours. -Avoid caffeine to include tea, soda, and coffee. -If sexually active, void at least 15 to 20 minutes after sexual intercourse. -Follow-up in the emergency department if you develop worsening urinary symptoms with fever, chills, worsening abdominal pain, or other concerns.  -If your urine culture is negative, and you are continuing to experience symptoms, please follow-up with your urologist for further evaluation. -Follow-up as needed.

## 2023-03-26 NOTE — ED Triage Notes (Signed)
Pt reports she has burning with urination, urgency, low back pain, low abdominal pain, and foul smelling urine x 4 days.    Took macrobid but no relief x 2 days.

## 2023-03-28 LAB — URINE CULTURE
Culture: NO GROWTH
Special Requests: NORMAL

## 2023-03-29 ENCOUNTER — Ambulatory Visit: Payer: Medicare HMO | Admitting: Urology

## 2023-03-29 ENCOUNTER — Ambulatory Visit (HOSPITAL_COMMUNITY)
Admission: RE | Admit: 2023-03-29 | Discharge: 2023-03-29 | Disposition: A | Discharge status: Home / Self Care | Payer: Medicare HMO | Source: Ambulatory Visit | Attending: Urology | Admitting: Urology

## 2023-03-29 VITALS — BP 100/63 | HR 88

## 2023-03-29 DIAGNOSIS — N200 Calculus of kidney: Secondary | ICD-10-CM

## 2023-03-29 DIAGNOSIS — N3010 Interstitial cystitis (chronic) without hematuria: Secondary | ICD-10-CM

## 2023-03-29 DIAGNOSIS — N2889 Other specified disorders of kidney and ureter: Secondary | ICD-10-CM | POA: Diagnosis not present

## 2023-03-29 DIAGNOSIS — N3281 Overactive bladder: Secondary | ICD-10-CM

## 2023-03-29 LAB — URINALYSIS, ROUTINE W REFLEX MICROSCOPIC
Bilirubin, UA: NEGATIVE
Glucose, UA: NEGATIVE
Ketones, UA: NEGATIVE
Nitrite, UA: NEGATIVE
Protein,UA: NEGATIVE
Specific Gravity, UA: 1.01 (ref 1.005–1.030)
Urobilinogen, Ur: 0.2 mg/dL (ref 0.2–1.0)
pH, UA: 6 (ref 5.0–7.5)

## 2023-03-29 LAB — MICROSCOPIC EXAMINATION

## 2023-03-29 MED ORDER — GEMTESA 75 MG PO TABS
1.0000 | ORAL_TABLET | Freq: Every day | ORAL | 11 refills | Status: DC
Start: 2023-03-29 — End: 2023-04-19

## 2023-03-29 MED ORDER — ELMIRON 100 MG PO CAPS
100.0000 mg | ORAL_CAPSULE | Freq: Two times a day (BID) | ORAL | 11 refills | Status: DC
Start: 2023-03-29 — End: 2024-03-27

## 2023-03-29 MED ORDER — HYDROXYZINE HCL 10 MG PO TABS
10.0000 mg | ORAL_TABLET | Freq: Every day | ORAL | 11 refills | Status: DC
Start: 2023-03-29 — End: 2024-03-03

## 2023-03-29 NOTE — Progress Notes (Signed)
03/29/2023 2:07 PM   Bethany King January 09, 1971 [id number]  Referring provider: Elfredia Nevins, MD [address] [place],  [place] [address]  Followup nephrolithiasis, IC and OAB  HPI: Bethany King is a 52yo here for followup for nephrolithiasis, IC and OAB. KUB shows stable bilateral renal calculi. For the past 3 weeks she has noted worsening urinary urgency and frequency. She has urinary frequency every 30 minutes. She is now wearing diapers. Gemtesa worked int he past. She remains on elmiron and hydroxyzine.    PMH: Past Medical History:  Diagnosis Date   Allergic rhinitis    Anxiety    Arthritis    ASCUS of cervix with negative high risk HPV 04/20/2022   04/20/22 repeat in 3 years per ASCCP guidelines, 5 year risk CIN 3+ is 0.27%    Bladder pain    Complication of anesthesia    Costochondritis    hx   DDD (degenerative disc disease), lumbar    Depression    GERD (gastroesophageal reflux disease)    Hepatic hemangioma 2006   stable   History of adenomatous polyp of colon    History of colitis    History of gastritis    History of kidney stones    HTN (hypertension)    Irritable bowel syndrome (IBS)    Medullary sponge kidney    left   Migraine    Nephrolithiasis    bilateral-- nonobstructive per CT   PONV (postoperative nausea and vomiting)    Psoriatic arthritis (HCC)    Psoriatic arthritis (HCC)    Spinal stenosis    Urgency of urination     Surgical History: Past Surgical History:  Procedure Laterality Date   ANTERIOR CERVICAL DECOMP/DISCECTOMY FUSION  02/26/2014   C5 - C6; fusion with plate   BIOPSY  01/04/2019   Procedure: BIOPSY;  Surgeon: Malissa Hippo, MD;  [facility];  Service: Endoscopy;;  gastric polyps   BIOPSY  02/10/2022   Procedure: BIOPSY;  Surgeon: Dolores Frame, MD;  [facility];  Service: Gastroenterology;;   Fidela Salisbury RELEASE Bilateral right 04-01-2010/  left 05-30-2010   CHOLECYSTECTOMY  N/A 07/14/2013   Procedure: LAPAROSCOPIC CHOLECYSTECTOMY;  Surgeon: Dalia Heading, MD;  Location: [facility];  Service: General;  Laterality: N/A;   COLONOSCOPY  10-13-2010   COLONOSCOPY N/A 01/04/2019   Procedure: COLONOSCOPY;  Surgeon: Malissa Hippo, MD;  [facility];  Service: Endoscopy;  Laterality: N/A;   COLONOSCOPY WITH PROPOFOL N/A 02/10/2022   Procedure: COLONOSCOPY WITH PROPOFOL;  Surgeon: Dolores Frame, MD;  [facility];  Service: Gastroenterology;  Laterality: N/A;  915 ASA 3   CYSTO WITH HYDRODISTENSION N/A 04/02/2015   Procedure: CYSTOSCOPY/HYDRODISTENSION, BLADDER BIOPSY WITH FULGURATION;  Surgeon: Bjorn Pippin, MD;  Location: [facility];  Service: Urology;  Laterality: N/A;   CYSTO WITH HYDRODISTENSION N/A 03/25/2018   Procedure: CYSTOSCOPY/HYDRODISTENSION, INSTILL MARCAIN AND PYRIDIUM;  Surgeon: Bjorn Pippin, MD;  Location: [facility];  Service: Urology;  Laterality: N/A;   CYSTO WITH HYDRODISTENSION N/A 01/15/2020   Procedure: CYSTOSCOPY/HYDRODISTENSION;  Surgeon: Malen Gauze, MD;  Location: [facility];  Service: Urology;  Laterality: N/A;   CYSTOSCOPY W/ URETERAL STENT PLACEMENT Bilateral 04/08/2016   Procedure: CYSTOSCOPY WITH BILATERAL RETROGRADE PYELOGRAM, BILATERAL URETERAL STENT PLACEMENT;  Surgeon: Malen Gauze, MD;  Location: [facility];  Service: Urology;  Laterality: Bilateral;   CYSTOSCOPY W/ URETERAL STENT PLACEMENT Left 04/15/2016   Procedure: CYSTOSCOPY WITH RETROGRADE PYELOGRAM/URETERAL  STENT PLACEMENT;  Surgeon: Malen Gauze, MD;  Location: [facility];  Service: Urology;  Laterality: Left;   CYSTOSCOPY WITH BIOPSY N/A 01/15/2020   Procedure: CYSTOSCOPY WITH BLADDER BIOPSY AND FULGERATION;  Surgeon: Malen Gauze, MD;  [facility];  Service: Urology;  Laterality: N/A;   CYSTOSCOPY WITH HOLMIUM LASER LITHOTRIPSY Right 04/08/2016   Procedure: CYSTOSCOPY WITH RIGHT RENAL STONE EXTRACTION WITH HOLMIUM LASER  LITHOTRIPSY;  Surgeon: Malen Gauze, MD;  [facility];  Service: Urology;  Laterality: Right;   CYSTOSCOPY WITH HOLMIUM LASER LITHOTRIPSY Left 04/15/2016   Procedure: CYSTOSCOPY WITH HOLMIUM LASER LITHOTRIPSY;  Surgeon: Malen Gauze, MD;  [facility];  Service: Urology;  Laterality: Left;   CYSTOSCOPY WITH RETROGRADE PYELOGRAM, URETEROSCOPY AND STENT PLACEMENT Left 07/05/2017   Procedure: CYSTOSCOPY WITH RETROGRADE PYELOGRAM, URETEROSCOPY AND STENT PLACEMENT;  Surgeon: Malen Gauze, MD;  [facility];  Service: Urology;  Laterality: Left;   DIAGNOSTIC LAPAROSCOPY     DILATION AND CURETTAGE OF UTERUS     DILITATION & CURRETTAGE/HYSTROSCOPY WITH THERMACHOICE ABLATION N/A 09/07/2012   Procedure: DILATATION & CURETTAGE/HYSTEROSCOPY WITH THERMACHOICE ABLATION;  Surgeon: Lazaro Arms, MD;  Location: [facility];  Service: Gynecology;  Laterality: N/A;   ESOPHAGOGASTRODUODENOSCOPY  10/14/10   ESOPHAGOGASTRODUODENOSCOPY N/A 01/04/2019   Procedure: ESOPHAGOGASTRODUODENOSCOPY (EGD);  Surgeon: Malissa Hippo, MD;  [facility];  Service: Endoscopy;  Laterality: N/A;  9:30   ESOPHAGOGASTRODUODENOSCOPY (EGD) WITH PROPOFOL N/A 02/10/2022   Procedure: ESOPHAGOGASTRODUODENOSCOPY (EGD) WITH PROPOFOL;  Surgeon: Dolores Frame, MD;  [facility];  Service: Gastroenterology;  Laterality: N/A;   EXTRACORPOREAL SHOCK WAVE LITHOTRIPSY  multiple   HOLMIUM LASER APPLICATION Left 07/05/2017   Procedure: HOLMIUM LASER APPLICATION;  Surgeon: Malen Gauze, MD;  Location: [facility];  Service: Urology;  Laterality: Left;   PERCUTANEOUS NEPHROSTOLITHOTOMY  2003   PLANTAR FASCIA SURGERY Bilateral right 2008//  left 2010   POLYPECTOMY  01/04/2019   Procedure: POLYPECTOMY;  Surgeon: Malissa Hippo, MD;  [facility];  Service: Endoscopy;;  colon   STONE EXTRACTION WITH BASKET Left 04/15/2016   Procedure: STONE EXTRACTION WITH BASKET;  Surgeon: Malen Gauze, MD;  Location: [facility];  Service: Urology;  Laterality: Left;    Home Medications:  Allergies as of 03/29/2023       Reactions   Hydromorphone Itching   Prednisone Other (See Comments)   Increase anxiety symptoms, insomnia   Tramadol Other (See Comments)   Causes dizziness   Sulfa Antibiotics Rash        Medication List        Accurate as of March 29, 2023  2:07 PM. If you have any questions, ask your nurse or doctor.          albuterol 108 (90 Base) MCG/ACT inhaler Commonly known as: VENTOLIN HFA Inhale 2 puffs into the lungs every 4 (four) hours as needed.   ALPRAZolam 0.5 MG tablet Commonly known as: XANAX Take 0.5 mg by mouth 2 (two) times daily as needed for anxiety.   amoxicillin-clavulanate 875-125 MG tablet Commonly known as: AUGMENTIN Take 1 tablet by mouth every 12 (twelve) hours.   cephALEXin 500 MG capsule Commonly known as: Keflex Take 1 capsule (500 mg total) by mouth 4 (four) times daily for 7 days.   DULoxetine 60 MG capsule Commonly known as: CYMBALTA Take 60 mg by mouth daily.   Elmiron 100 MG capsule Generic drug: pentosan polysulfate Take 1 capsule (100  mg total) by mouth daily.   estradiol 1 MG tablet Commonly known as: ESTRACE TAKE 1 TABLET(1 MG) BY MOUTH DAILY   fluticasone 50 MCG/ACT nasal spray Commonly known as: FLONASE Place 1 spray into both nostrils daily as needed for allergies or rhinitis.   HYDROcodone-acetaminophen 10-325 MG tablet Commonly known as: NORCO Take 1 tablet by mouth 4 (four) times daily as needed for moderate pain. What changed:  how much to take when to take this   hydrOXYzine 10 MG tablet Commonly known as: ATARAX Take 1 tablet (10 mg total) by mouth at bedtime.   potassium chloride SA 20 MEQ tablet Commonly known as: KLOR-CON M Take 20 mEq by mouth 2 (two) times daily.   progesterone 200 MG capsule Commonly known as: PROMETRIUM TAKE 1 CAPSULE(200 MG) BY MOUTH AT BEDTIME   Skyrizi  150 MG/ML Sosy prefilled syringe Generic drug: risankizumab-rzaa Inject into the skin. Every 3 months   triamterene-hydrochlorothiazide 37.5-25 MG capsule Commonly known as: DYAZIDE Take 1 capsule by mouth daily.   Vitamin D3 125 MCG (5000 UT) Caps Take 5,000 Units by mouth daily.        Allergies:  Allergies  Allergen Reactions   Hydromorphone Itching   Prednisone Other (See Comments)    Increase anxiety symptoms, insomnia   Tramadol Other (See Comments)    Causes dizziness    Sulfa Antibiotics Rash    Family History: Family History  Problem Relation Age of Onset   Irritable bowel syndrome Mother    Scleroderma Mother    Rheum arthritis Mother    Asthma Mother    Melanoma Father    Cancer Father        Melanoma   Colon cancer Cousin 66       second cousin Nettie Elm Lovelace)   Cancer Maternal Grandmother        Ovarian   Diabetes Maternal Grandmother    Thyroid disease Maternal Aunt     Social History:  reports that she has never smoked. She has never been exposed to tobacco smoke. She has never used smokeless tobacco. She reports that she does not drink alcohol and does not use drugs.  ROS: All other review of systems were reviewed and are negative except what is noted above in HPI  Physical Exam: BP 100/63   Pulse 88   Constitutional:  Alert and oriented, No acute distress. HEENT: [place] AT, moist mucus membranes.  Trachea midline, no masses. Cardiovascular: No clubbing, cyanosis, or edema. Respiratory: Normal respiratory effort, no increased work of breathing. GI: Abdomen is soft, nontender, nondistended, no abdominal masses GU: No CVA tenderness.  Lymph: No cervical or inguinal lymphadenopathy. Skin: No rashes, bruises or suspicious lesions. Neurologic: Grossly intact, no focal deficits, moving all 4 extremities. Psychiatric: Normal mood and affect.  Laboratory Data: Lab Results  Component Value Date   WBC 7.0 03/21/2018   HGB 10.9 (L) 03/21/2018    HCT 35.3 (L) 03/21/2018   MCV 96.7 03/21/2018   PLT 330 03/21/2018    Lab Results  Component Value Date   CREATININE 0.73 02/05/2022    No results found for: "PSA"  No results found for: "TESTOSTERONE"  Lab Results  Component Value Date   HGBA1C 5.7 (H) 09/15/2011    Urinalysis    Component Value Date/Time   COLORURINE AMBER (A) 11/17/2019 1600   APPEARANCEUR Clear 10/05/2022 1341   LABSPEC 1.004 (L) 11/17/2019 1600   PHURINE 6.0 11/17/2019 1600   GLUCOSEU Negative 10/05/2022 1341  HGBUR SMALL (A) 11/17/2019 1600   BILIRUBINUR negative 03/26/2023 1104   BILIRUBINUR Negative 10/05/2022 1341   KETONESUR negative 03/26/2023 1104   KETONESUR NEGATIVE 11/17/2019 1600   PROTEINUR negative 03/26/2023 1104   PROTEINUR Negative 10/05/2022 1341   PROTEINUR NEGATIVE 11/17/2019 1600   UROBILINOGEN 0.2 03/26/2023 1104   UROBILINOGEN 0.2 02/02/2013 1620   NITRITE Negative 03/26/2023 1104   NITRITE Negative 10/05/2022 1341   NITRITE POSITIVE (A) 11/17/2019 1600   LEUKOCYTESUR Small (1+) (A) 03/26/2023 1104   LEUKOCYTESUR 2+ (A) 10/05/2022 1341   LEUKOCYTESUR LARGE (A) 11/17/2019 1600    Lab Results  Component Value Date   LABMICR See below: 10/05/2022   WBCUA >30 (A) 10/05/2022   LABEPIT >10 (A) 10/05/2022   MUCUS Present 10/01/2021   BACTERIA Moderate (A) 10/05/2022    Pertinent Imaging: KUb today: Images reviewed and discussed with the patient  Results for orders placed during the hospital encounter of 10/05/22  Abdomen 1 view (KUB)  Narrative CLINICAL DATA:  Nephrolithiasis  EXAM: ABDOMEN - 1 VIEW  COMPARISON:  04/06/2022  FINDINGS: Multiple BILATERAL renal calculi.  Largest RIGHT renal calculus is at inferior pole, 10 mm diameter.  Largest LEFT renal calculus at inferior pole, 12 mm diameter.  No ureteral or vesicular calculi identified.  Bowel gas pattern normal.  No osseous findings.  Surgical clips RIGHT upper quadrant, likely reflect  prior cholecystectomy.  IMPRESSION: Multiple BILATERAL renal calculi as above.   Electronically Signed By: Ulyses Southward M.D. On: 10/05/2022 15:55  No results found for this or any previous visit.  No results found for this or any previous visit.  No results found for this or any previous visit.  Results for orders placed during the hospital encounter of 09/01/17  US RENAL  Narrative CLINICAL DATA:  Kidney stones  EXAM: RENAL / URINARY TRACT ULTRASOUND COMPLETE  COMPARISON:  Abdominal ultrasound of August 03, 2017  FINDINGS: Right Kidney:  Length: 10.7 cm. There is cortical thinning diffusely. The cortical echotexture remains lower than that of the adjacent liver. There is a nonobstructing mid/upper pole stone measuring approximately 7 mm in diameter. There is no hydronephrosis.  Left Kidney:  Length: 11 cm. There is diffuse cortical thinning similar to that on the right. There is a mid to upper pole stone measuring 6 mm in diameter. There is no hydronephrosis.  Bladder:  Appears normal for degree of bladder distention. Bilateral ureteral jets are observed.  IMPRESSION: Bilateral nonobstructing kidney stones. Diffuse renal cortical thinning. No hydronephrosis. Normal appearing urinary bladder.   Electronically Signed By: David  [place] M.D. On: 09/01/2017 10:23  No valid procedures specified. No results found for this or any previous visit.  No results found for this or any previous visit.   Assessment & Plan:    1. Renal calculus -followup 1 year with KUB - Urinalysis, Routine w reflex microscopic  2. Chronic interstitial cystitis Continue hydroxyzine and elmiron  3. Overactive bladder -we will restart gemtesa   No follow-ups on file.  Wilkie Aye, MD  [facility] [place]

## 2023-03-31 ENCOUNTER — Other Ambulatory Visit: Payer: Self-pay | Admitting: Adult Health

## 2023-04-01 LAB — URINE CULTURE: Organism ID, Bacteria: NO GROWTH

## 2023-04-06 ENCOUNTER — Encounter: Payer: Self-pay | Admitting: Urology

## 2023-04-06 NOTE — Patient Instructions (Signed)

## 2023-04-07 DIAGNOSIS — E785 Hyperlipidemia, unspecified: Secondary | ICD-10-CM | POA: Diagnosis not present

## 2023-04-19 ENCOUNTER — Encounter: Payer: Self-pay | Admitting: Adult Health

## 2023-04-19 ENCOUNTER — Ambulatory Visit: Payer: Medicare HMO | Admitting: Adult Health

## 2023-04-19 VITALS — BP 109/69 | HR 76 | Ht 62.5 in | Wt 161.5 lb

## 2023-04-19 DIAGNOSIS — R454 Irritability and anger: Secondary | ICD-10-CM | POA: Diagnosis not present

## 2023-04-19 DIAGNOSIS — Z01419 Encounter for gynecological examination (general) (routine) without abnormal findings: Secondary | ICD-10-CM

## 2023-04-19 DIAGNOSIS — R232 Flushing: Secondary | ICD-10-CM

## 2023-04-19 DIAGNOSIS — N939 Abnormal uterine and vaginal bleeding, unspecified: Secondary | ICD-10-CM

## 2023-04-19 DIAGNOSIS — R32 Unspecified urinary incontinence: Secondary | ICD-10-CM

## 2023-04-19 DIAGNOSIS — Z79890 Hormone replacement therapy (postmenopausal): Secondary | ICD-10-CM

## 2023-04-19 DIAGNOSIS — Z1211 Encounter for screening for malignant neoplasm of colon: Secondary | ICD-10-CM

## 2023-04-19 DIAGNOSIS — N3281 Overactive bladder: Secondary | ICD-10-CM

## 2023-04-19 DIAGNOSIS — N898 Other specified noninflammatory disorders of vagina: Secondary | ICD-10-CM

## 2023-04-19 DIAGNOSIS — Z1331 Encounter for screening for depression: Secondary | ICD-10-CM | POA: Diagnosis not present

## 2023-04-19 LAB — HEMOCCULT GUIAC POC 1CARD (OFFICE): Fecal Occult Blood, POC: NEGATIVE

## 2023-04-19 MED ORDER — ESTRADIOL 0.1 MG/GM VA CREA
TOPICAL_CREAM | VAGINAL | 1 refills | Status: DC
Start: 2023-04-19 — End: 2024-03-03

## 2023-04-19 MED ORDER — ESTRADIOL 1 MG PO TABS: ORAL_TABLET | ORAL | 12 refills | Status: DC

## 2023-04-19 NOTE — Progress Notes (Signed)
Patient ID: Bethany King, female   DOB: April 03, 1971, 52 y.o.   MRN: [id number] History of Present Illness: Bethany King is a 52 year old white female, divorced, G0P0 in for a well woman gyn exam. She is still having some hot flashes but better and is irritable, and has vaginal dryness. She is still see Dr Ronne Binning about urinary issues(OAB, UI). She had an ablation and has some vaginal spotting at times, she is on HRT.      Component Value Date/Time   DIAGPAP (A) 04/13/2022 1340    - Atypical squamous cells of undetermined significance (ASC-US)   DIAGPAP  04/04/2019 1349    - Negative for intraepithelial lesion or malignancy (NILM)   HPVHIGH Negative 04/13/2022 1340   HPVHIGH Negative 04/04/2019 1349   ADEQPAP  04/13/2022 1340    Satisfactory for evaluation; transformation zone component PRESENT.   ADEQPAP  04/04/2019 1349    Satisfactory for evaluation; transformation zone component PRESENT.    PCP is Dr Sherwood Gambler.  Current Medications, Allergies, Past Medical History, Past Surgical History, Family History and Social History were reviewed in [employer] electronic medical record.     Review of Systems: Patient denies any headaches(migraines without aura), hearing loss, fatigue, blurred vision, shortness of breath, chest pain, abdominal pain, problems with bowel movements, or intercourse. No joint pain or mood swings.  See HPI for positives.    Physical Exam:BP 109/69 (BP Location: Left Arm, Patient Position: Sitting, Cuff Size: Normal)   Pulse 76   Ht 5' 2.5" (1.588 m)   Wt 161 lb 8 oz (73.3 kg)   LMP 03/30/2023   BMI 29.07 kg/m   General:  Well developed, well nourished, no acute distress Skin:  Warm and dry Neck:  Midline trachea, normal thyroid, good ROM, no lymphadenopathy Lungs; Clear to auscultation bilaterally Breast:  No dominant palpable mass, retraction, or nipple discharge Cardiovascular: Regular rate and rhythm Abdomen:  Soft, non tender, no  hepatosplenomegaly Pelvic:  External genitalia is normal in appearance, no lesions.  The vagina is normal in appearance. Urethra has no lesions or masses. The cervix is smooth. Uterus is felt to be normal size, shape, and contour.  No adnexal masses or tenderness noted.Bladder is non tender, no masses felt. Rectal: Good sphincter tone, no polyps, or hemorrhoids felt.  Hemoccult negative. Extremities/musculoskeletal:  No swelling or varicosities noted, no clubbing or cyanosis Psych:  No mood changes, alert and cooperative,seems happy AA is 0 Fall risk is low    04/19/2023    1:32 PM 04/13/2022    1:40 PM 04/10/2021    1:38 PM  Depression screen PHQ 2/9  Decreased Interest 1 0 0  Down, Depressed, Hopeless 0 0 0  PHQ - 2 Score 1 0 0  Altered sleeping 1 0 2  Tired, decreased energy 1 0 0  Change in appetite 1 0 0  Feeling bad or failure about yourself  0 0 0  Trouble concentrating 1 0 0  Moving slowly or fidgety/restless 0 0 0  Suicidal thoughts 0 0 0  PHQ-9 Score 5 0 2       04/19/2023    1:41 PM 04/13/2022    1:40 PM 04/10/2021    1:38 PM 04/09/2020    1:47 PM  GAD 7 : Generalized Anxiety Score  Nervous, Anxious, on Edge 1 1 1 1   Control/stop worrying 1 0 1 1  Worry too much - different things 2 1 1 1   Trouble relaxing 1 1 1  Restless 0 0 1 0  Easily annoyed or irritable 2 1 1 1   Afraid - awful might happen 0 0 1 0  Total GAD 7 Score 7 4 7        Upstream - 04/19/23 1339       Pregnancy Intention Screening   Does the patient want to become pregnant in the next year? No    Does the patient's partner want to become pregnant in the next year? No    Would the patient like to discuss contraceptive options today? No      Contraception Wrap Up   Current Method No Method - Other Reason    Reason for No Current Contraceptive Method at Intake ([facility] Only) Other    End Method No Method - Other Reason    Contraception Counseling Provided No            Examination chaperoned  by Malachy Mood LPN  Impression and plan: 1. Encounter for well woman exam with routine gynecological exam Pap in 2026 Physical in 1 year Labs with PCP Mammogram was negative 09/21/22 Colonoscopy per GI  2. Encounter for screening fecal occult blood testing Hemoccult was negative  - POCT occult blood stool  3. Hormone replacement therapy (HRT) Takes estrace 1 mg daily and Prometrium 200 mg 1 daily Refilled estrace Has refills on Prometrium  Will get pelvic US to assess uterus and ovaries  - US PELVIC COMPLETE WITH TRANSVAGINAL; Future Meds ordered this encounter  Medications   estradiol (ESTRACE) 1 MG tablet    Sig: TAKE 1 TABLET(1 MG) BY MOUTH DAILY    Dispense:  30 tablet    Refill:  12    ZERO refills remain on this prescription. Your patient is requesting advance approval of refills for this medication to PREVENT ANY MISSED DOSES    Order Specific Question:   Supervising Provider    Answer:   Duane Lope H [[id number]]   estradiol (ESTRACE) 0.1 MG/GM vaginal cream    Sig: Use 0.5 gm in vagina 2-3 x weekly    Dispense:  42.5 g    Refill:  1    Order Specific Question:   Supervising Provider    Answer:   EURE, LUTHER H [[id number]]    4. Hot flashes Better on estrace and Prometrium   5. Irritable   6. OAB (overactive bladder) Sees Dr Ronne Binning, stopped Cena Benton due to side effects   7. Urinary incontinence, unspecified type Follow up with urology   8. Vaginal dryness +vaginal dryness Rx estrace vaginal cream Use 0.5 gm in vagina 2-3 x weekly   9. Vaginal spotting Sp ablation Has had some spotting Will get pelvic US in office, in about 2 weeks,  to assess uterus and ovaries and talk when results back    - US PELVIC COMPLETE WITH TRANSVAGINAL; Future

## 2023-05-03 ENCOUNTER — Encounter: Payer: Self-pay | Admitting: Adult Health

## 2023-05-03 ENCOUNTER — Ambulatory Visit: Payer: Medicare HMO | Admitting: Radiology

## 2023-05-03 ENCOUNTER — Ambulatory Visit: Payer: Medicare HMO | Admitting: Adult Health

## 2023-05-03 VITALS — BP 129/80 | HR 68 | Ht 62.5 in | Wt 162.0 lb

## 2023-05-03 DIAGNOSIS — N939 Abnormal uterine and vaginal bleeding, unspecified: Secondary | ICD-10-CM | POA: Diagnosis not present

## 2023-05-03 DIAGNOSIS — Z79890 Hormone replacement therapy (postmenopausal): Secondary | ICD-10-CM

## 2023-05-03 DIAGNOSIS — D219 Benign neoplasm of connective and other soft tissue, unspecified: Secondary | ICD-10-CM | POA: Diagnosis not present

## 2023-05-03 NOTE — Progress Notes (Signed)
TA and TV imaging obtained  -  Chaperone: Peggy Anteverted uterus with 12 mm fibroid anterior fundal myometrium Heterogeneous myometrium,  symmetrical myometrial thickness Hx Em ablation 2014   Endometrial thickness = 3.8 mm   Homogeneous endometrial cavity - Avascular cavity and canal Normal ovaries, both ovaries seen with small follicles - neg adnexal regions Neg CDS - no free fluid

## 2023-05-03 NOTE — Progress Notes (Signed)
  Subjective:     Patient ID: Bethany King, female   DOB: 29-Sep-1970, 52 y.o.   MRN: [id number]  HPI Bethany King is 52 year old white female,divorced, G0P0, in for [place], had ablation and is spotting fairly regularly, she is on HRT.     Component Value Date/Time   DIAGPAP (A) 04/13/2022 1340    - Atypical squamous cells of undetermined significance (ASC-US)   DIAGPAP  04/04/2019 1349    - Negative for intraepithelial lesion or malignancy (NILM)   HPVHIGH Negative 04/13/2022 1340   HPVHIGH Negative 04/04/2019 1349   ADEQPAP  04/13/2022 1340    Satisfactory for evaluation; transformation zone component PRESENT.   ADEQPAP  04/04/2019 1349    Satisfactory for evaluation; transformation zone component PRESENT.    PCP is Dr Sherwood Gambler.  Review of Systems +vaginal spotting Reviewed past medical,surgical, social and family history. Reviewed medications and allergies.     Objective:   Physical Exam BP 129/80 (BP Location: Right Arm, Patient Position: Sitting, Cuff Size: Normal)   Pulse 68   Ht 5' 2.5" (1.588 m)   Wt 162 lb (73.5 kg)   LMP 03/30/2023   BMI 29.16 kg/m     Skin warm and dry.  Lungs: clear to ausculation bilaterally. Cardiovascular: regular rate and rhythm.   Upstream - 05/03/23 1503       Pregnancy Intention Screening   Does the patient want to become pregnant in the next year? No    Does the patient's partner want to become pregnant in the next year? No    Would the patient like to discuss contraceptive options today? No      Contraception Wrap Up   Current Method No Method - Other Reason    End Method No Method - Other Reason    Contraception Counseling Provided No            Reviewed [place] with her . Assessment:     1. Hormone replacement therapy (HRT) Continue estrace 1 mg daily and prometrium 200 mg 1 at hs, has refills   2. Vaginal spotting Having monthly spotting EEC is 3.8 mm Uterus was normal size with tiny fibroid 12 mm, has  Ovaries normal with small  follicles.   3. Fibroid Has 12 mm ant fundal     Plan:     Follow up in 6 months for ROS or sooner if needed

## 2023-05-27 DIAGNOSIS — E663 Overweight: Secondary | ICD-10-CM | POA: Diagnosis not present

## 2023-05-27 DIAGNOSIS — M5126 Other intervertebral disc displacement, lumbar region: Secondary | ICD-10-CM | POA: Diagnosis not present

## 2023-05-27 DIAGNOSIS — J0100 Acute maxillary sinusitis, unspecified: Secondary | ICD-10-CM | POA: Diagnosis not present

## 2023-05-27 DIAGNOSIS — I10 Essential (primary) hypertension: Secondary | ICD-10-CM | POA: Diagnosis not present

## 2023-05-27 DIAGNOSIS — Z6828 Body mass index (BMI) 28.0-28.9, adult: Secondary | ICD-10-CM | POA: Diagnosis not present

## 2023-05-27 DIAGNOSIS — M5010 Cervical disc disorder with radiculopathy, unspecified cervical region: Secondary | ICD-10-CM | POA: Diagnosis not present

## 2023-05-27 DIAGNOSIS — L4050 Arthropathic psoriasis, unspecified: Secondary | ICD-10-CM | POA: Diagnosis not present

## 2023-05-27 DIAGNOSIS — K219 Gastro-esophageal reflux disease without esophagitis: Secondary | ICD-10-CM | POA: Diagnosis not present

## 2023-06-03 ENCOUNTER — Telehealth: Payer: Self-pay | Admitting: Urology

## 2023-06-03 NOTE — Telephone Encounter (Signed)
Patient made aware PA has been started for Elmiron (Key: [id number])

## 2023-06-03 NOTE — Telephone Encounter (Signed)
Received a letter from insurance stating it will no longer cover her RX for Elmiron. Please call patient

## 2023-06-04 NOTE — Telephone Encounter (Signed)
Patient called and made aware that PA was approved until 06/14/2024

## 2023-07-19 DIAGNOSIS — M25579 Pain in unspecified ankle and joints of unspecified foot: Secondary | ICD-10-CM | POA: Diagnosis not present

## 2023-07-19 DIAGNOSIS — L409 Psoriasis, unspecified: Secondary | ICD-10-CM | POA: Diagnosis not present

## 2023-07-19 DIAGNOSIS — M2550 Pain in unspecified joint: Secondary | ICD-10-CM | POA: Diagnosis not present

## 2023-07-19 DIAGNOSIS — M1990 Unspecified osteoarthritis, unspecified site: Secondary | ICD-10-CM | POA: Diagnosis not present

## 2023-07-19 DIAGNOSIS — L4050 Arthropathic psoriasis, unspecified: Secondary | ICD-10-CM | POA: Diagnosis not present

## 2023-07-19 DIAGNOSIS — M79643 Pain in unspecified hand: Secondary | ICD-10-CM | POA: Diagnosis not present

## 2023-07-19 DIAGNOSIS — Z79899 Other long term (current) drug therapy: Secondary | ICD-10-CM | POA: Diagnosis not present

## 2023-07-19 DIAGNOSIS — N3010 Interstitial cystitis (chronic) without hematuria: Secondary | ICD-10-CM | POA: Diagnosis not present

## 2023-07-20 DIAGNOSIS — S0502XA Injury of conjunctiva and corneal abrasion without foreign body, left eye, initial encounter: Secondary | ICD-10-CM | POA: Diagnosis not present

## 2023-07-27 DIAGNOSIS — S0502XA Injury of conjunctiva and corneal abrasion without foreign body, left eye, initial encounter: Secondary | ICD-10-CM | POA: Diagnosis not present

## 2023-07-27 DIAGNOSIS — H04123 Dry eye syndrome of bilateral lacrimal glands: Secondary | ICD-10-CM | POA: Diagnosis not present

## 2023-08-13 ENCOUNTER — Ambulatory Visit
Admission: RE | Admit: 2023-08-13 | Discharge: 2023-08-13 | Disposition: A | Discharge status: Home / Self Care | Payer: Self-pay | Source: Ambulatory Visit | Attending: Nurse Practitioner | Admitting: Nurse Practitioner

## 2023-08-13 VITALS — BP 114/67 | HR 98 | Temp 99.1°F | Resp 18

## 2023-08-13 DIAGNOSIS — J101 Influenza due to other identified influenza virus with other respiratory manifestations: Secondary | ICD-10-CM

## 2023-08-13 LAB — POC COVID19/FLU A&B COMBO
Covid Antigen, POC: NEGATIVE
Influenza A Antigen, POC: POSITIVE — AB
Influenza B Antigen, POC: NEGATIVE

## 2023-08-13 MED ORDER — ONDANSETRON 4 MG PO TBDP: 4.0000 mg | ORAL_TABLET | Freq: Three times a day (TID) | ORAL | 0 refills | Status: DC | PRN

## 2023-08-13 MED ORDER — OSELTAMIVIR PHOSPHATE 75 MG PO CAPS: 75.0000 mg | ORAL_CAPSULE | Freq: Two times a day (BID) | ORAL | 0 refills | Status: DC

## 2023-08-13 MED ORDER — PROMETHAZINE-DM 6.25-15 MG/5ML PO SYRP
5.0000 mL | ORAL_SOLUTION | Freq: Every evening | ORAL | 0 refills | Status: DC | PRN
Start: 2023-08-13 — End: 2023-09-24

## 2023-08-13 NOTE — ED Triage Notes (Signed)
 Pt states she has had cough, congestion, fever, body aches, ear pain and some light headedness since Wednesday. She has been taking mucinex regular and tylenol. She did use her albuterol MDI yesterday and it helped.

## 2023-08-13 NOTE — ED Provider Notes (Signed)
 [facility]    CSN: [id number] Arrival date & time: 08/13/23  0911      History   Chief Complaint Chief Complaint  Patient presents with   Otalgia   Cough   Nasal Congestion   Dizziness    HPI Bethany King is a 53 y.o. female.   The history is provided by the patient.   Patient presents with a 2-day history of fever, chills, body aches, nasal congestion, runny nose, cough, and nausea.  Tmax around 101.  Denies headache, ear drainage, wheezing, chest pain, abdominal pain, nausea, vomiting, diarrhea, or rash.  Patient states when she gets sick, she has a tendency to begin wheezing.  States she has been using her albuterol inhaler, also states she has been taking over-the-counter cough and cold medications for her symptoms. Past Medical History:  Diagnosis Date   Allergic rhinitis    Anxiety    Arthritis    ASCUS of cervix with negative high risk HPV 04/20/2022   04/20/22 repeat in 3 years per ASCCP guidelines, 5 year risk CIN 3+ is 0.27%    Bladder pain    Complication of anesthesia    Costochondritis    hx   DDD (degenerative disc disease), lumbar    Depression    GERD (gastroesophageal reflux disease)    Hepatic hemangioma 2006   stable   History of adenomatous polyp of colon    History of colitis    History of gastritis    History of kidney stones    HTN (hypertension)    Irritable bowel syndrome (IBS)    Medullary sponge kidney    left   Migraine    Nephrolithiasis    bilateral-- nonobstructive per CT   PONV (postoperative nausea and vomiting)    Psoriatic arthritis (HCC)    Psoriatic arthritis (HCC)    Spinal stenosis    Urgency of urination     Patient Active Problem List   Diagnosis Date Noted   Fibroid 05/03/2023   Irritable 04/19/2023   Urinary incontinence 04/19/2023   OAB (overactive bladder) 04/19/2023   Vaginal spotting 04/19/2023   ASCUS of cervix with negative high risk HPV 04/20/2022   Early satiety 01/06/2022    Hematochezia 01/06/2022   Bloating 01/06/2022   Hemorrhoids 12/09/2021   Rectal itching 12/09/2021   Overactive bladder 09/09/2020   Encounter for screening fecal occult blood testing 04/09/2020   Encounter for well woman exam with routine gynecological exam 04/09/2020   Hormone replacement therapy (HRT) 04/09/2020   Pelvic pain in female 04/05/2020   Urinary urgency 04/05/2020   Vaginal dryness 02/27/2020   Burning with urination 02/27/2020   Vaginal burning 02/27/2020   Chronic interstitial cystitis 02/09/2020   Menopause 04/04/2019   Screening for colorectal cancer 04/04/2019   Encounter for gynecological examination with Papanicolaou smear of cervix 04/04/2019   Routine cervical smear 04/04/2019   Vaginal dryness, menopausal 04/04/2019   Hot flashes 04/04/2019   Psoriatic arthritis (HCC) 12/26/2018   History of colonic polyps 12/26/2018   Pain of upper abdomen 12/26/2018   Abdominal pain 06/23/2014   Acute gastroenteritis 06/23/2014   Back pain with radiation 10/24/2012   Breast mass 10/09/2012   Seasonal allergies 09/22/2012   Hypokalemia 09/22/2012   Dysmenorrhea 08/30/2012   Sciatica 05/29/2012   Sinusitis 03/02/2012   Back pain 02/10/2012   Menorrhagia 12/30/2011   Shoulder pain, left 12/03/2011   Costochondritis 11/17/2011   Shoulder bursitis 11/17/2011   Recurrent UTI 10/09/2011  Migraine headache 10/08/2011   Obesity 09/04/2011   HTN (hypertension) 07/14/2011   Anxiety 07/14/2011   Diarrhea 05/19/2011   Chest pain 04/30/2011   IBS (irritable bowel syndrome) 09/23/2010   Epigastric pain 09/23/2010   Constipation 01/29/2010   Gastroesophageal reflux disease 01/24/2010    Past Surgical History:  Procedure Laterality Date   ANTERIOR CERVICAL DECOMP/DISCECTOMY FUSION  02/26/2014   C5 - C6; fusion with plate   BIOPSY  01/04/2019   Procedure: BIOPSY;  Surgeon: Malissa Hippo, MD;  [facility]: [facility];  Service: Endoscopy;;  gastric polyps   BIOPSY   02/10/2022   Procedure: BIOPSY;  Surgeon: Dolores Frame, MD;  [facility]: [facility];  Service: Gastroenterology;;   Fidela Salisbury RELEASE Bilateral right 04-01-2010/  left 05-30-2010   CHOLECYSTECTOMY N/A 07/14/2013   Procedure: LAPAROSCOPIC CHOLECYSTECTOMY;  Surgeon: Dalia Heading, MD;  Location: [facility];  Service: General;  Laterality: N/A;   COLONOSCOPY  10-13-2010   COLONOSCOPY N/A 01/04/2019   Procedure: COLONOSCOPY;  Surgeon: Malissa Hippo, MD;  [facility];  Service: Endoscopy;  Laterality: N/A;   COLONOSCOPY WITH PROPOFOL N/A 02/10/2022   Procedure: COLONOSCOPY WITH PROPOFOL;  Surgeon: Dolores Frame, MD;  [facility]: [facility];  Service: Gastroenterology;  Laterality: N/A;  915 ASA 3   CYSTO WITH HYDRODISTENSION N/A 04/02/2015   Procedure: CYSTOSCOPY/HYDRODISTENSION, BLADDER BIOPSY WITH FULGURATION;  Surgeon: Bjorn Pippin, MD;  Location: [facility];  Service: Urology;  Laterality: N/A;   CYSTO WITH HYDRODISTENSION N/A 03/25/2018   Procedure: CYSTOSCOPY/HYDRODISTENSION, INSTILL MARCAIN AND PYRIDIUM;  Surgeon: Bjorn Pippin, MD;  Location: [facility];  Service: Urology;  Laterality: N/A;   CYSTO WITH HYDRODISTENSION N/A 01/15/2020   Procedure: CYSTOSCOPY/HYDRODISTENSION;  Surgeon: Malen Gauze, MD;  Location: [facility];  Service: Urology;  Laterality: N/A;   CYSTOSCOPY W/ URETERAL STENT PLACEMENT Bilateral 04/08/2016   Procedure: CYSTOSCOPY WITH BILATERAL RETROGRADE PYELOGRAM, BILATERAL URETERAL STENT PLACEMENT;  Surgeon: Malen Gauze, MD;  Location: [facility];  Service: Urology;  Laterality: Bilateral;   CYSTOSCOPY W/ URETERAL STENT PLACEMENT Left 04/15/2016   Procedure: CYSTOSCOPY WITH RETROGRADE PYELOGRAM/URETERAL STENT PLACEMENT;  Surgeon: Malen Gauze, MD;  Location: [facility];  Service: Urology;  Laterality: Left;   CYSTOSCOPY WITH BIOPSY N/A 01/15/2020   Procedure: CYSTOSCOPY WITH BLADDER BIOPSY AND FULGERATION;  Surgeon: Malen Gauze, MD;  Location: [facility];  Service: Urology;  Laterality: N/A;   CYSTOSCOPY WITH HOLMIUM LASER LITHOTRIPSY Right 04/08/2016   Procedure: CYSTOSCOPY WITH RIGHT RENAL STONE EXTRACTION WITH HOLMIUM LASER LITHOTRIPSY;  Surgeon: Malen Gauze, MD;  Location: [facility];  Service: Urology;  Laterality: Right;   CYSTOSCOPY WITH HOLMIUM LASER LITHOTRIPSY Left 04/15/2016   Procedure: CYSTOSCOPY WITH HOLMIUM LASER LITHOTRIPSY;  Surgeon: Malen Gauze, MD;  Location: [facility];  Service: Urology;  Laterality: Left;   CYSTOSCOPY WITH RETROGRADE PYELOGRAM, URETEROSCOPY AND STENT PLACEMENT Left 07/05/2017   Procedure: CYSTOSCOPY WITH RETROGRADE PYELOGRAM, URETEROSCOPY AND STENT PLACEMENT;  Surgeon: Malen Gauze, MD;  Location: [facility];  Service: Urology;  Laterality: Left;   DIAGNOSTIC LAPAROSCOPY     DILATION AND CURETTAGE OF UTERUS     DILITATION & CURRETTAGE/HYSTROSCOPY WITH THERMACHOICE ABLATION N/A 09/07/2012   Procedure: DILATATION & CURETTAGE/HYSTEROSCOPY WITH THERMACHOICE ABLATION;  Surgeon: Lazaro Arms, MD;  Location: [facility];  Service: Gynecology;  Laterality: N/A;   ESOPHAGOGASTRODUODENOSCOPY  10/14/10   ESOPHAGOGASTRODUODENOSCOPY N/A 01/04/2019   Procedure: ESOPHAGOGASTRODUODENOSCOPY (EGD);  Surgeon: Malissa Hippo, MD;  [facility];  Service: Endoscopy;  Laterality: N/A;  9:30   ESOPHAGOGASTRODUODENOSCOPY (EGD) WITH PROPOFOL N/A 02/10/2022   Procedure: ESOPHAGOGASTRODUODENOSCOPY (EGD) WITH PROPOFOL;  Surgeon: Dolores Frame, MD;  [facility];  Service: Gastroenterology;  Laterality: N/A;   EXTRACORPOREAL SHOCK WAVE LITHOTRIPSY  multiple   HOLMIUM LASER APPLICATION Left 07/05/2017   Procedure: HOLMIUM LASER APPLICATION;  Surgeon: Malen Gauze, MD;  Location: [facility];  Service: Urology;  Laterality: Left;   PERCUTANEOUS NEPHROSTOLITHOTOMY  2003   PLANTAR FASCIA SURGERY Bilateral right 2008//  left 2010   POLYPECTOMY  01/04/2019   Procedure:  POLYPECTOMY;  Surgeon: Malissa Hippo, MD;  [facility];  Service: Endoscopy;;  colon   STONE EXTRACTION WITH BASKET Left 04/15/2016   Procedure: STONE EXTRACTION WITH BASKET;  Surgeon: Malen Gauze, MD;  Location: [facility];  Service: Urology;  Laterality: Left;    OB History     Gravida  0   Para      Term      Preterm      AB      Living  0      SAB      IAB      Ectopic      Multiple      Live Births               Home Medications    Prior to Admission medications   Medication Sig Start Date End Date Taking? Authorizing Provider  albuterol (VENTOLIN HFA) 108 (90 Base) MCG/ACT inhaler Inhale 2 puffs into the lungs every 4 (four) hours as needed. 03/21/23  Yes Particia Nearing, PA-C  Cholecalciferol (VITAMIN D3) 125 MCG (5000 UT) CAPS Take 5,000 Units by mouth daily.   Yes [provider]  DULoxetine (CYMBALTA) 60 MG capsule Take 60 mg by mouth 2 (two) times daily.   Yes [provider]  estradiol (ESTRACE) 0.1 MG/GM vaginal cream Use 0.5 gm in vagina 2-3 x weekly 04/19/23  Yes Cyril Mourning A, NP  estradiol (ESTRACE) 1 MG tablet TAKE 1 TABLET(1 MG) BY MOUTH DAILY 04/19/23  Yes Cyril Mourning A, NP  hydrOXYzine (ATARAX) 10 MG tablet Take 1 tablet (10 mg total) by mouth at bedtime. 03/29/23  Yes McKenzie, Mardene Celeste, MD  ondansetron (ZOFRAN-ODT) 4 MG disintegrating tablet Take 1 tablet (4 mg total) by mouth every 8 (eight) hours as needed. 08/13/23  Yes Leath-Warren, Sadie Haber, NP  oseltamivir (TAMIFLU) 75 MG capsule Take 1 capsule (75 mg total) by mouth every 12 (twelve) hours. 08/13/23  Yes Leath-Warren, Sadie Haber, NP  pentosan polysulfate (ELMIRON) 100 MG capsule Take 1 capsule (100 mg total) by mouth 2 (two) times daily. 03/29/23  Yes McKenzie, Mardene Celeste, MD  potassium chloride SA (K-DUR,KLOR-CON) 20 MEQ tablet Take 20 mEq by mouth 2 (two) times daily.   Yes [provider]  progesterone (PROMETRIUM) 200 MG  capsule TAKE 1 CAPSULE(200 MG) BY MOUTH AT BEDTIME 03/31/23  Yes Cyril Mourning A, NP  promethazine-dextromethorphan (PROMETHAZINE-DM) 6.25-15 MG/5ML syrup Take 5 mLs by mouth at bedtime as needed. 08/13/23  Yes Leath-Warren, Sadie Haber, NP  triamterene-hydrochlorothiazide (DYAZIDE) 37.5-25 MG capsule Take 1 capsule by mouth daily.  01/09/18  Yes [provider]  ALPRAZolam Prudy Feeler) 0.5 MG tablet Take 0.5 mg by mouth 2 (two) times daily as needed for anxiety. 11/10/19   [provider]  fluticasone (FLONASE) 50 MCG/ACT nasal spray Place 1 spray into both nostrils daily  as needed for allergies or rhinitis.    [provider]  HYDROcodone-acetaminophen (NORCO) 10-325 MG tablet Take 1 tablet by mouth 4 (four) times daily as needed for moderate pain. Patient taking differently: Take 1-2 tablets by mouth every 6 (six) hours as needed for moderate pain (pain score 4-6). 01/15/20   McKenzie, Mardene Celeste, MD  risankizumab-rzaa [facility]) 150 MG/ML SOSY prefilled syringe Inject into the skin. Every 3 months    [provider]  Adalimumab (HUMIRA PEN) 40 MG/0.4ML PNKT Inject 40 mg into the skin every 14 (fourteen) days. Twice a month   08/09/19  [provider]  pantoprazole (PROTONIX) 40 MG tablet Take 1 tablet (40 mg total) by mouth daily before breakfast. 01/04/19 08/09/19  Malissa Hippo, MD    Family History Family History  Problem Relation Age of Onset   Irritable bowel syndrome Mother    Scleroderma Mother    Rheum arthritis Mother    Asthma Mother    Melanoma Father    Cancer Father        Melanoma   Colon cancer Cousin 65       second cousin Nettie Elm Lovelace)   Cancer Maternal Grandmother        Ovarian   Diabetes Maternal Grandmother    Thyroid disease Maternal Aunt     Social History Social History   Tobacco Use   Smoking status: Never    Passive exposure: Never   Smokeless tobacco: Never  Vaping Use   Vaping status: Never Used  Substance  Use Topics   Alcohol use: No   Drug use: No     Allergies   Hydromorphone, Prednisone, Tramadol, and Sulfa antibiotics   Review of Systems Review of Systems Per HPI  Physical Exam Triage Vital Signs ED Triage Vitals  Encounter Vitals Group     BP 08/13/23 0925 114/67     Systolic BP Percentile --      Diastolic BP Percentile --      Pulse Rate 08/13/23 0925 98     Resp 08/13/23 0925 18     Temp 08/13/23 0925 99.1 F (37.3 C)     Temp Source 08/13/23 0925 Oral     SpO2 08/13/23 0925 98 %     Weight --      Height --      Head Circumference --      Peak Flow --      Pain Score 08/13/23 0922 6     Pain Loc --      Pain Education --      Exclude from Growth Chart --    No data found.  Updated Vital Signs BP 114/67 (BP Location: Right Arm)   Pulse 98   Temp 99.1 F (37.3 C) (Oral)   Resp 18   SpO2 98%   Visual Acuity Right Eye Distance:   Left Eye Distance:   Bilateral Distance:    Right Eye Near:   Left Eye Near:    Bilateral Near:     Physical Exam Vitals and nursing note reviewed.  Constitutional:      General: She is not in acute distress.    Appearance: Normal appearance.  HENT:     Head: Normocephalic.     Right Ear: Tympanic membrane, ear canal and external ear normal.     Left Ear: Tympanic membrane, ear canal and external ear normal.     Nose: Congestion present.     Right Turbinates: Enlarged and swollen.  Left Turbinates: Enlarged and swollen.     Right Sinus: No maxillary sinus tenderness or frontal sinus tenderness.     Left Sinus: No maxillary sinus tenderness or frontal sinus tenderness.     Mouth/Throat:     Lips: Pink.     Mouth: Mucous membranes are moist.     Pharynx: Uvula midline. Postnasal drip present. No pharyngeal swelling or posterior oropharyngeal erythema.  Eyes:     Extraocular Movements: Extraocular movements intact.     Conjunctiva/sclera: Conjunctivae normal.     Pupils: Pupils are equal, round, and reactive to  light.  Cardiovascular:     Rate and Rhythm: Normal rate and regular rhythm.     Pulses: Normal pulses.     Heart sounds: Normal heart sounds.  Pulmonary:     Effort: Pulmonary effort is normal. No respiratory distress.     Breath sounds: Normal breath sounds. No stridor. No wheezing, rhonchi or rales.  Abdominal:     General: Bowel sounds are normal.     Palpations: Abdomen is soft.     Tenderness: There is no abdominal tenderness.  Musculoskeletal:     Cervical back: Normal range of motion.  Lymphadenopathy:     Cervical: No cervical adenopathy.  Skin:    General: Skin is warm and dry.  Neurological:     General: No focal deficit present.     Mental Status: She is alert.  Psychiatric:        Mood and Affect: Mood normal.        Behavior: Behavior normal.      UC Treatments / Results  Labs (all labs ordered are listed, but only abnormal results are displayed) Labs Reviewed  POC COVID19/FLU A&B COMBO - Abnormal; Notable for the following components:      Result Value   Influenza A Antigen, POC Positive (*)    All other components within normal limits    EKG   Radiology No results found.  Procedures Procedures (including critical care time)  Medications Ordered in UC Medications - No data to display  Initial Impression / Assessment and Plan / UC Course  I have reviewed the triage vital signs and the nursing notes.  Pertinent labs & imaging results that were available during my care of the patient were reviewed by me and considered in my medical decision making (see chart for details).  COVID/flu test is positive for influenza A.  Tamiflu 75 mg prescribed, along with Promethazine DM for cough and ondansetron 4 mg ODT for nausea.  Supportive care recommendations were provided and discussed with the patient to include fluids, rest, over-the-counter analgesics, and use of a humidifier at nighttime during sleep.  Discussed indications regarding follow-up.  Patient was  in agreement with this plan of care and verbalizes understanding.  All questions were answered.  Patient stable for discharge.  Final Clinical Impressions(s) / UC Diagnoses   Final diagnoses:  Influenza A     Discharge Instructions      You have tested positive for influenza A. Take medication as prescribed.  Begin use of your Flonase daily until symptoms improve. Continue ibuprofen or Tylenol as needed for pain, fever, or general discomfort. Recommend the use of Pedialyte or Gatorade to prevent dehydration. Recommend using a humidifier in the bedroom at nighttime during sleep and having her sleep elevated on pillows while cough symptoms persist. You should remain home until you have been fever free for 24 hours with no medication. Please be advised that symptoms  should improve over the next 5 to 7 days.  If symptoms appear to be worsening, or if there are other concerns, you may follow-up in this clinic or with his pediatrician for further evaluation. Follow-up as needed.      ED Prescriptions     Medication Sig Dispense Auth. Provider   oseltamivir (TAMIFLU) 75 MG capsule Take 1 capsule (75 mg total) by mouth every 12 (twelve) hours. 10 capsule Leath-Warren, Sadie Haber, NP   ondansetron (ZOFRAN-ODT) 4 MG disintegrating tablet Take 1 tablet (4 mg total) by mouth every 8 (eight) hours as needed. 20 tablet Leath-Warren, Sadie Haber, NP   promethazine-dextromethorphan (PROMETHAZINE-DM) 6.25-15 MG/5ML syrup Take 5 mLs by mouth at bedtime as needed. 118 mL Leath-Warren, Sadie Haber, NP      PDMP not reviewed this encounter.   Abran Cantor, NP 08/13/23 1133

## 2023-08-13 NOTE — Discharge Instructions (Addendum)
 You have tested positive for influenza A. Take medication as prescribed.  Begin use of your Flonase daily until symptoms improve. Continue ibuprofen or Tylenol as needed for pain, fever, or general discomfort. Recommend the use of Pedialyte or Gatorade to prevent dehydration. Recommend using a humidifier in the bedroom at nighttime during sleep and having her sleep elevated on pillows while cough symptoms persist. You should remain home until you have been fever free for 24 hours with no medication. Please be advised that symptoms should improve over the next 5 to 7 days.  If symptoms appear to be worsening, or if there are other concerns, you may follow-up in this clinic or with his pediatrician for further evaluation. Follow-up as needed.

## 2023-08-27 DIAGNOSIS — L4050 Arthropathic psoriasis, unspecified: Secondary | ICD-10-CM | POA: Diagnosis not present

## 2023-08-27 DIAGNOSIS — F419 Anxiety disorder, unspecified: Secondary | ICD-10-CM | POA: Diagnosis not present

## 2023-08-27 DIAGNOSIS — I10 Essential (primary) hypertension: Secondary | ICD-10-CM | POA: Diagnosis not present

## 2023-08-27 DIAGNOSIS — G894 Chronic pain syndrome: Secondary | ICD-10-CM | POA: Diagnosis not present

## 2023-09-06 ENCOUNTER — Other Ambulatory Visit (HOSPITAL_COMMUNITY): Payer: Self-pay | Admitting: Internal Medicine

## 2023-09-06 DIAGNOSIS — Z1231 Encounter for screening mammogram for malignant neoplasm of breast: Secondary | ICD-10-CM

## 2023-09-24 ENCOUNTER — Ambulatory Visit
Admission: RE | Admit: 2023-09-24 | Discharge: 2023-09-24 | Disposition: A | Discharge status: Home / Self Care | Payer: Self-pay | Source: Ambulatory Visit | Attending: Nurse Practitioner | Admitting: Nurse Practitioner

## 2023-09-24 VITALS — BP 140/75 | HR 85 | Temp 98.5°F | Resp 18

## 2023-09-24 DIAGNOSIS — J069 Acute upper respiratory infection, unspecified: Secondary | ICD-10-CM

## 2023-09-24 LAB — POC COVID19/FLU A&B COMBO
Covid Antigen, POC: NEGATIVE
Influenza A Antigen, POC: NEGATIVE
Influenza B Antigen, POC: NEGATIVE

## 2023-09-24 MED ORDER — BENZONATATE 100 MG PO CAPS: 100.0000 mg | ORAL_CAPSULE | Freq: Three times a day (TID) | ORAL | 0 refills | Status: DC | PRN

## 2023-09-24 NOTE — Discharge Instructions (Signed)
 You have a viral upper respiratory infection.  Symptoms should improve over the next week to 10 days.  If you develop chest pain or shortness of breath, go to the emergency room.  COVID-19 and influenza testing is negative today.  Some things that can make you feel better are: - Increased rest - Increasing fluid with water/sugar free electrolytes - Acetaminophen and ibuprofen as needed for fever/pain - Salt water gargling, chloraseptic spray and throat lozenges - OTC guaifenesin (Mucinex) 600 mg twice daily - Saline sinus flushes or a neti pot - Humidifying the air  Continue Claritin and Flonase.  You can also take the cough suppressant medication every 8 hours as needed to help with a dry cough.  Seek care if symptoms do not improve with treatment.

## 2023-09-24 NOTE — ED Triage Notes (Signed)
 Low grade fever, body aches, cough x 3 days.  States having bilateral ear pain.  Recent history of ear infection

## 2023-09-24 NOTE — ED Provider Notes (Signed)
 [facility]    CSN: [id number] Arrival date & time: 09/24/23  1018      History   Chief Complaint Chief Complaint  Patient presents with   Ear Fullness    Ear pain, low fever, fatigue, muscle aches - Entered by patient    HPI Bethany King is a 53 y.o. female.   Patient presents today for 3-day history of low-grade fever, Tmax 100 F at home, body aches, cough and congestion, and bilateral ear pressure and radiating at nighttime.  Reports he recently was treated for an ear infection with Augmentin twice daily for 10 days, finished antibiotic 4 days ago.  Symptoms began after gardening outside.  Patient denies chest pain, shortness of breath, abdominal pain, nausea/vomiting, and diarrhea.  Reports her appetite has been decreased.  No known sick contacts.  Has taken Claritin and Flonase for symptoms with minimal temporary improvement.    Past Medical History:  Diagnosis Date   Allergic rhinitis    Anxiety    Arthritis    ASCUS of cervix with negative high risk HPV 04/20/2022   04/20/22 repeat in 3 years per ASCCP guidelines, 5 year risk CIN 3+ is 0.27%    Bladder pain    Complication of anesthesia    Costochondritis    hx   DDD (degenerative disc disease), lumbar    Depression    GERD (gastroesophageal reflux disease)    Hepatic hemangioma 2006   stable   History of adenomatous polyp of colon    History of colitis    History of gastritis    History of kidney stones    HTN (hypertension)    Irritable bowel syndrome (IBS)    Medullary sponge kidney    left   Migraine    Nephrolithiasis    bilateral-- nonobstructive per CT   PONV (postoperative nausea and vomiting)    Psoriatic arthritis (HCC)    Psoriatic arthritis (HCC)    Spinal stenosis    Urgency of urination     Patient Active Problem List   Diagnosis Date Noted   Fibroid 05/03/2023   Irritable 04/19/2023   Urinary incontinence 04/19/2023   OAB (overactive bladder) 04/19/2023   Vaginal  spotting 04/19/2023   ASCUS of cervix with negative high risk HPV 04/20/2022   Early satiety 01/06/2022   Hematochezia 01/06/2022   Bloating 01/06/2022   Hemorrhoids 12/09/2021   Rectal itching 12/09/2021   Overactive bladder 09/09/2020   Encounter for screening fecal occult blood testing 04/09/2020   Encounter for well woman exam with routine gynecological exam 04/09/2020   Hormone replacement therapy (HRT) 04/09/2020   Pelvic pain in female 04/05/2020   Urinary urgency 04/05/2020   Vaginal dryness 02/27/2020   Burning with urination 02/27/2020   Vaginal burning 02/27/2020   Chronic interstitial cystitis 02/09/2020   Menopause 04/04/2019   Screening for colorectal cancer 04/04/2019   Encounter for gynecological examination with Papanicolaou smear of cervix 04/04/2019   Routine cervical smear 04/04/2019   Vaginal dryness, menopausal 04/04/2019   Hot flashes 04/04/2019   Psoriatic arthritis (HCC) 12/26/2018   History of colonic polyps 12/26/2018   Pain of upper abdomen 12/26/2018   Abdominal pain 06/23/2014   Acute gastroenteritis 06/23/2014   Back pain with radiation 10/24/2012   Breast mass 10/09/2012   Seasonal allergies 09/22/2012   Hypokalemia 09/22/2012   Dysmenorrhea 08/30/2012   Sciatica 05/29/2012   Sinusitis 03/02/2012   Back pain 02/10/2012   Menorrhagia 12/30/2011   Shoulder pain, left  12/03/2011   Costochondritis 11/17/2011   Shoulder bursitis 11/17/2011   Recurrent UTI 10/09/2011   Migraine headache 10/08/2011   Obesity 09/04/2011   HTN (hypertension) 07/14/2011   Anxiety 07/14/2011   Diarrhea 05/19/2011   Chest pain 04/30/2011   IBS (irritable bowel syndrome) 09/23/2010   Epigastric pain 09/23/2010   Constipation 01/29/2010   Gastroesophageal reflux disease 01/24/2010    Past Surgical History:  Procedure Laterality Date   ANTERIOR CERVICAL DECOMP/DISCECTOMY FUSION  02/26/2014   C5 - C6; fusion with plate   BIOPSY  01/04/2019   Procedure: BIOPSY;   Surgeon: Malissa Hippo, MD;  [facility]: [facility];  Service: Endoscopy;;  gastric polyps   BIOPSY  02/10/2022   Procedure: BIOPSY;  Surgeon: Dolores Frame, MD;  [facility]: [facility];  Service: Gastroenterology;;   Fidela Salisbury RELEASE Bilateral right 04-01-2010/  left 05-30-2010   CHOLECYSTECTOMY N/A 07/14/2013   Procedure: LAPAROSCOPIC CHOLECYSTECTOMY;  Surgeon: Dalia Heading, MD;  Location: [facility];  Service: General;  Laterality: N/A;   COLONOSCOPY  10-13-2010   COLONOSCOPY N/A 01/04/2019   Procedure: COLONOSCOPY;  Surgeon: Malissa Hippo, MD;  [facility]: [facility];  Service: Endoscopy;  Laterality: N/A;   COLONOSCOPY WITH PROPOFOL N/A 02/10/2022   Procedure: COLONOSCOPY WITH PROPOFOL;  Surgeon: Dolores Frame, MD;  [facility]: [facility];  Service: Gastroenterology;  Laterality: N/A;  915 ASA 3   CYSTO WITH HYDRODISTENSION N/A 04/02/2015   Procedure: CYSTOSCOPY/HYDRODISTENSION, BLADDER BIOPSY WITH FULGURATION;  Surgeon: Bjorn Pippin, MD;  Location: [facility];  Service: Urology;  Laterality: N/A;   CYSTO WITH HYDRODISTENSION N/A 03/25/2018   Procedure: CYSTOSCOPY/HYDRODISTENSION, INSTILL MARCAIN AND PYRIDIUM;  Surgeon: Bjorn Pippin, MD;  Location: [facility];  Service: Urology;  Laterality: N/A;   CYSTO WITH HYDRODISTENSION N/A 01/15/2020   Procedure: CYSTOSCOPY/HYDRODISTENSION;  Surgeon: Malen Gauze, MD;  Location: [facility];  Service: Urology;  Laterality: N/A;   CYSTOSCOPY W/ URETERAL STENT PLACEMENT Bilateral 04/08/2016   Procedure: CYSTOSCOPY WITH BILATERAL RETROGRADE PYELOGRAM, BILATERAL URETERAL STENT PLACEMENT;  Surgeon: Malen Gauze, MD;  Location: [facility];  Service: Urology;  Laterality: Bilateral;   CYSTOSCOPY W/ URETERAL STENT PLACEMENT Left 04/15/2016   Procedure: CYSTOSCOPY WITH RETROGRADE PYELOGRAM/URETERAL STENT PLACEMENT;  Surgeon: Malen Gauze, MD;  Location: [facility];  Service: Urology;  Laterality: Left;   CYSTOSCOPY  WITH BIOPSY N/A 01/15/2020   Procedure: CYSTOSCOPY WITH BLADDER BIOPSY AND FULGERATION;  Surgeon: Malen Gauze, MD;  Location: [facility];  Service: Urology;  Laterality: N/A;   CYSTOSCOPY WITH HOLMIUM LASER LITHOTRIPSY Right 04/08/2016   Procedure: CYSTOSCOPY WITH RIGHT RENAL STONE EXTRACTION WITH HOLMIUM LASER LITHOTRIPSY;  Surgeon: Malen Gauze, MD;  Location: [facility];  Service: Urology;  Laterality: Right;   CYSTOSCOPY WITH HOLMIUM LASER LITHOTRIPSY Left 04/15/2016   Procedure: CYSTOSCOPY WITH HOLMIUM LASER LITHOTRIPSY;  Surgeon: Malen Gauze, MD;  Location: [facility];  Service: Urology;  Laterality: Left;   CYSTOSCOPY WITH RETROGRADE PYELOGRAM, URETEROSCOPY AND STENT PLACEMENT Left 07/05/2017   Procedure: CYSTOSCOPY WITH RETROGRADE PYELOGRAM, URETEROSCOPY AND STENT PLACEMENT;  Surgeon: Malen Gauze, MD;  Location: [facility];  Service: Urology;  Laterality: Left;   DIAGNOSTIC LAPAROSCOPY     DILATION AND CURETTAGE OF UTERUS     DILITATION & CURRETTAGE/HYSTROSCOPY WITH THERMACHOICE ABLATION N/A 09/07/2012   Procedure: DILATATION & CURETTAGE/HYSTEROSCOPY WITH THERMACHOICE ABLATION;  Surgeon: Lazaro Arms, MD;  Location: [facility];  Service: Gynecology;  Laterality: N/A;   ESOPHAGOGASTRODUODENOSCOPY  10/14/10  ESOPHAGOGASTRODUODENOSCOPY N/A 01/04/2019   Procedure: ESOPHAGOGASTRODUODENOSCOPY (EGD);  Surgeon: Malissa Hippo, MD;  [facility];  Service: Endoscopy;  Laterality: N/A;  9:30   ESOPHAGOGASTRODUODENOSCOPY (EGD) WITH PROPOFOL N/A 02/10/2022   Procedure: ESOPHAGOGASTRODUODENOSCOPY (EGD) WITH PROPOFOL;  Surgeon: Dolores Frame, MD;  [facility];  Service: Gastroenterology;  Laterality: N/A;   EXTRACORPOREAL SHOCK WAVE LITHOTRIPSY  multiple   HOLMIUM LASER APPLICATION Left 07/05/2017   Procedure: HOLMIUM LASER APPLICATION;  Surgeon: Malen Gauze, MD;  Location: [facility];  Service: Urology;  Laterality: Left;   PERCUTANEOUS NEPHROSTOLITHOTOMY   2003   PLANTAR FASCIA SURGERY Bilateral right 2008//  left 2010   POLYPECTOMY  01/04/2019   Procedure: POLYPECTOMY;  Surgeon: Malissa Hippo, MD;  [facility];  Service: Endoscopy;;  colon   STONE EXTRACTION WITH BASKET Left 04/15/2016   Procedure: STONE EXTRACTION WITH BASKET;  Surgeon: Malen Gauze, MD;  Location: [facility];  Service: Urology;  Laterality: Left;    OB History     Gravida  0   Para      Term      Preterm      AB      Living  0      SAB      IAB      Ectopic      Multiple      Live Births               Home Medications    Prior to Admission medications   Medication Sig Start Date End Date Taking? Authorizing Provider  benzonatate (TESSALON) 100 MG capsule Take 1 capsule (100 mg total) by mouth 3 (three) times daily as needed for cough. Do not take with alcohol or while operating or driving heavy machinery 09/26/22  Yes Valentino Nose, NP  ALPRAZolam Prudy Feeler) 0.5 MG tablet Take 0.5 mg by mouth 2 (two) times daily as needed for anxiety. 11/10/19   [provider]  Cholecalciferol (VITAMIN D3) 125 MCG (5000 UT) CAPS Take 5,000 Units by mouth daily.    [provider]  DULoxetine (CYMBALTA) 60 MG capsule Take 60 mg by mouth 2 (two) times daily.    [provider]  estradiol (ESTRACE) 0.1 MG/GM vaginal cream Use 0.5 gm in vagina 2-3 x weekly 04/19/23   Adline Potter, NP  estradiol (ESTRACE) 1 MG tablet TAKE 1 TABLET(1 MG) BY MOUTH DAILY 04/19/23   Adline Potter, NP  fluticasone (FLONASE) 50 MCG/ACT nasal spray Place 1 spray into both nostrils daily as needed for allergies or rhinitis.    [provider]  HYDROcodone-acetaminophen (NORCO) 10-325 MG tablet Take 1 tablet by mouth 4 (four) times daily as needed for moderate pain. Patient taking differently: Take 1-2 tablets by mouth every 6 (six) hours as needed for moderate pain (pain score 4-6). 01/15/20   McKenzie, Mardene Celeste, MD  hydrOXYzine  (ATARAX) 10 MG tablet Take 1 tablet (10 mg total) by mouth at bedtime. 03/29/23   McKenzie, Mardene Celeste, MD  pentosan polysulfate (ELMIRON) 100 MG capsule Take 1 capsule (100 mg total) by mouth 2 (two) times daily. 03/29/23   McKenzie, Mardene Celeste, MD  potassium chloride SA (K-DUR,KLOR-CON) 20 MEQ tablet Take 20 mEq by mouth 2 (two) times daily.    [provider]  progesterone (PROMETRIUM) 200 MG capsule TAKE 1 CAPSULE(200 MG) BY MOUTH AT BEDTIME 03/31/23   Adline Potter, NP  risankizumab-rzaa [facility]) 150 MG/ML SOSY prefilled  syringe Inject into the skin. Every 3 months    [provider]  triamterene-hydrochlorothiazide (DYAZIDE) 37.5-25 MG capsule Take 1 capsule by mouth daily.  01/09/18   [provider]  Adalimumab (HUMIRA PEN) 40 MG/0.4ML PNKT Inject 40 mg into the skin every 14 (fourteen) days. Twice a month   08/09/19  [provider]  pantoprazole (PROTONIX) 40 MG tablet Take 1 tablet (40 mg total) by mouth daily before breakfast. 01/04/19 08/09/19  Malissa Hippo, MD    Family History Family History  Problem Relation Age of Onset   Irritable bowel syndrome Mother    Scleroderma Mother    Rheum arthritis Mother    Asthma Mother    Melanoma Father    Cancer Father        Melanoma   Colon cancer Cousin 9       second cousin Nettie Elm Lovelace)   Cancer Maternal Grandmother        Ovarian   Diabetes Maternal Grandmother    Thyroid disease Maternal Aunt     Social History Social History   Tobacco Use   Smoking status: Never    Passive exposure: Never   Smokeless tobacco: Never  Vaping Use   Vaping status: Never Used  Substance Use Topics   Alcohol use: No   Drug use: No     Allergies   Hydromorphone, Prednisone, Tramadol, and Sulfa antibiotics   Review of Systems Review of Systems Per HPI  Physical Exam Triage Vital Signs ED Triage Vitals  Encounter Vitals Group     BP 09/24/23 1028 (!) 140/75     Systolic BP Percentile  --      Diastolic BP Percentile --      Pulse Rate 09/24/23 1028 85     Resp 09/24/23 1028 18     Temp 09/24/23 1028 98.5 F (36.9 C)     Temp Source 09/24/23 1028 Oral     SpO2 09/24/23 1028 98 %     Weight --      Height --      Head Circumference --      Peak Flow --      Pain Score 09/24/23 1029 4     Pain Loc --      Pain Education --      Exclude from Growth Chart --    No data found.  Updated Vital Signs BP (!) 140/75 (BP Location: Right Arm)   Pulse 85   Temp 98.5 F (36.9 C) (Oral)   Resp 18   SpO2 98%   Visual Acuity Right Eye Distance:   Left Eye Distance:   Bilateral Distance:    Right Eye Near:   Left Eye Near:    Bilateral Near:     Physical Exam Vitals and nursing note reviewed.  Constitutional:      General: She is not in acute distress.    Appearance: Normal appearance. She is not ill-appearing or toxic-appearing.  HENT:     Head: Normocephalic and atraumatic.     Right Ear: Ear canal and external ear normal. A middle ear effusion is present. There is impacted cerumen. Tympanic membrane is not perforated, erythematous or bulging.     Left Ear: Ear canal and external ear normal. A middle ear effusion is present. Tympanic membrane is not perforated, erythematous or bulging.     Ears:     Comments: Lighted curette was used to move flaky cerumen out of way to view the TM;  patient tolerated well     Nose: Congestion present. No rhinorrhea.     Mouth/Throat:     Mouth: Mucous membranes are moist.     Pharynx: Oropharynx is clear. Postnasal drip present. No oropharyngeal exudate or posterior oropharyngeal erythema.  Eyes:     General: No scleral icterus.    Extraocular Movements: Extraocular movements intact.  Cardiovascular:     Rate and Rhythm: Normal rate and regular rhythm.  Pulmonary:     Effort: Pulmonary effort is normal. No respiratory distress.     Breath sounds: Normal breath sounds. No wheezing, rhonchi or rales.  Musculoskeletal:      Cervical back: Normal range of motion and neck supple.  Lymphadenopathy:     Cervical: No cervical adenopathy.  Skin:    General: Skin is warm and dry.     Coloration: Skin is not jaundiced or pale.     Findings: No erythema or rash.  Neurological:     Mental Status: She is alert and oriented to person, place, and time.  Psychiatric:        Behavior: Behavior is cooperative.      UC Treatments / Results  Labs (all labs ordered are listed, but only abnormal results are displayed) Labs Reviewed  POC COVID19/FLU A&B COMBO    EKG   Radiology No results found.  Procedures Procedures (including critical care time)  Medications Ordered in UC Medications - No data to display  Initial Impression / Assessment and Plan / UC Course  I have reviewed the triage vital signs and the nursing notes.  Pertinent labs & imaging results that were available during my care of the patient were reviewed by me and considered in my medical decision making (see chart for details).   Patient is well-appearing, normotensive, afebrile, not tachycardic, not tachypneic, oxygenating well on room air.    1. Viral URI with cough Suspect viral etiology Vitals and examination today are reassuring  COVID-19 and influenza testing is negative today Supportive care discussed with patient, start cough suppressant at night time ER and return precautions discussed   The patient was given the opportunity to ask questions.  All questions answered to their satisfaction.  The patient is in agreement to this plan.   Final Clinical Impressions(s) / UC Diagnoses   Final diagnoses:  Viral URI with cough     Discharge Instructions      You have a viral upper respiratory infection.  Symptoms should improve over the next week to 10 days.  If you develop chest pain or shortness of breath, go to the emergency room.  COVID-19 and influenza testing is negative today.  Some things that can make you feel better  are: - Increased rest - Increasing fluid with water/sugar free electrolytes - Acetaminophen and ibuprofen as needed for fever/pain - Salt water gargling, chloraseptic spray and throat lozenges - OTC guaifenesin (Mucinex) 600 mg twice daily - Saline sinus flushes or a neti pot - Humidifying the air  Continue Claritin and Flonase.  You can also take the cough suppressant medication every 8 hours as needed to help with a dry cough.  Seek care if symptoms do not improve with treatment.    ED Prescriptions     Medication Sig Dispense Auth. Provider   benzonatate (TESSALON) 100 MG capsule Take 1 capsule (100 mg total) by mouth 3 (three) times daily as needed for cough. Do not take with alcohol or while operating or driving heavy machinery 21 capsule Cathlean Marseilles  A, NP      PDMP not reviewed this encounter.   Valentino Nose, NP 09/24/23 1135

## 2023-09-27 ENCOUNTER — Ambulatory Visit: Payer: Medicare HMO | Admitting: Urology

## 2023-10-05 DIAGNOSIS — G9332 Myalgic encephalomyelitis/chronic fatigue syndrome: Secondary | ICD-10-CM | POA: Diagnosis not present

## 2023-10-05 DIAGNOSIS — D518 Other vitamin B12 deficiency anemias: Secondary | ICD-10-CM | POA: Diagnosis not present

## 2023-10-05 DIAGNOSIS — E785 Hyperlipidemia, unspecified: Secondary | ICD-10-CM | POA: Diagnosis not present

## 2023-10-05 DIAGNOSIS — E876 Hypokalemia: Secondary | ICD-10-CM | POA: Diagnosis not present

## 2023-10-05 DIAGNOSIS — I10 Essential (primary) hypertension: Secondary | ICD-10-CM | POA: Diagnosis not present

## 2023-10-05 DIAGNOSIS — E559 Vitamin D deficiency, unspecified: Secondary | ICD-10-CM | POA: Diagnosis not present

## 2023-10-11 ENCOUNTER — Ambulatory Visit (HOSPITAL_COMMUNITY)

## 2023-10-22 ENCOUNTER — Ambulatory Visit (HOSPITAL_COMMUNITY)
Admission: RE | Admit: 2023-10-22 | Discharge: 2023-10-22 | Disposition: A | Discharge status: Home / Self Care | Source: Ambulatory Visit | Attending: Internal Medicine | Admitting: Internal Medicine

## 2023-10-22 ENCOUNTER — Encounter (HOSPITAL_COMMUNITY): Payer: Self-pay

## 2023-10-22 DIAGNOSIS — Z1231 Encounter for screening mammogram for malignant neoplasm of breast: Secondary | ICD-10-CM | POA: Insufficient documentation

## 2023-10-25 ENCOUNTER — Ambulatory Visit: Payer: Medicare HMO | Admitting: Urology

## 2023-10-25 ENCOUNTER — Other Ambulatory Visit: Payer: Self-pay

## 2023-10-25 DIAGNOSIS — N200 Calculus of kidney: Secondary | ICD-10-CM

## 2023-10-27 DIAGNOSIS — H524 Presbyopia: Secondary | ICD-10-CM | POA: Diagnosis not present

## 2023-11-01 ENCOUNTER — Ambulatory Visit: Admitting: Adult Health

## 2023-11-03 ENCOUNTER — Encounter: Payer: Self-pay | Admitting: Adult Health

## 2023-11-03 ENCOUNTER — Ambulatory Visit: Admitting: Adult Health

## 2023-11-03 VITALS — BP 122/76 | HR 65 | Ht 62.5 in | Wt 161.0 lb

## 2023-11-03 DIAGNOSIS — N939 Abnormal uterine and vaginal bleeding, unspecified: Secondary | ICD-10-CM

## 2023-11-03 DIAGNOSIS — Z79890 Hormone replacement therapy (postmenopausal): Secondary | ICD-10-CM

## 2023-11-03 DIAGNOSIS — R232 Flushing: Secondary | ICD-10-CM

## 2023-11-03 NOTE — Progress Notes (Signed)
  Subjective:     Patient ID: Bethany King, female   DOB: 04-30-1971, 53 y.o.   MRN: [id number]  HPI Bethany King is a 53 year old white female, divorced, PM in follow up on taking estrace  and Prometrium , and is feeling good. Decreased hot flashes.  She is sp ablation and may spot at times, had US  05/03/23 EEC was 3.8 mm.  She stopped vaginal estrogen was burning.     Component Value Date/Time   DIAGPAP (A) 04/13/2022 1340    - Atypical squamous cells of undetermined significance (ASC-US )   DIAGPAP  04/04/2019 1349    - Negative for intraepithelial lesion or malignancy (NILM)   HPVHIGH Negative 04/13/2022 1340   HPVHIGH Negative 04/04/2019 1349   ADEQPAP  04/13/2022 1340    Satisfactory for evaluation; transformation zone component PRESENT.   ADEQPAP  04/04/2019 1349    Satisfactory for evaluation; transformation zone component PRESENT.   PCP is Dr Lewayne Records   Review of Systems Feels good, Decreased hot flashes  Has spotting at times Reviewed past medical,surgical, social and family history. Reviewed medications and allergies.     Objective:   Physical Exam BP 122/76 (BP Location: Left Arm, Patient Position: Sitting, Cuff Size: Normal)   Pulse 65   Ht 5' 2.5" (1.588 m)   Wt 161 lb (73 kg)   LMP 10/13/2023 (Approximate)   BMI 28.98 kg/m     Skin warm and dry.  Lungs: clear to ausculation bilaterally. Cardiovascular: regular rate and rhythm.   Upstream - 11/03/23 1423       Pregnancy Intention Screening   Does the patient want to become pregnant in the next year? No    Does the patient's partner want to become pregnant in the next year? No    Would the patient like to discuss contraceptive options today? No      Contraception Wrap Up   Current Method No Method - Other Reason    End Method No Method - Other Reason    Contraception Counseling Provided No             Assessment:     1. Hormone replacement therapy (HRT) (Primary) On estrace  1 mg and Prometrium  200 mg  has refills  2. Hot flashes Decreased hot flashes  3. Vaginal spotting Sp ablation still spots some    Has US  05/03/23 EEC 3.8 mm Plan:     Return in 6 months for physical

## 2023-11-14 ENCOUNTER — Ambulatory Visit: Payer: Self-pay

## 2023-11-15 DIAGNOSIS — L4050 Arthropathic psoriasis, unspecified: Secondary | ICD-10-CM | POA: Diagnosis not present

## 2023-11-15 DIAGNOSIS — E663 Overweight: Secondary | ICD-10-CM | POA: Diagnosis not present

## 2023-11-15 DIAGNOSIS — Z6827 Body mass index (BMI) 27.0-27.9, adult: Secondary | ICD-10-CM | POA: Diagnosis not present

## 2023-11-15 DIAGNOSIS — K219 Gastro-esophageal reflux disease without esophagitis: Secondary | ICD-10-CM | POA: Diagnosis not present

## 2023-11-15 DIAGNOSIS — I10 Essential (primary) hypertension: Secondary | ICD-10-CM | POA: Diagnosis not present

## 2023-11-15 DIAGNOSIS — M5010 Cervical disc disorder with radiculopathy, unspecified cervical region: Secondary | ICD-10-CM | POA: Diagnosis not present

## 2023-11-15 DIAGNOSIS — J329 Chronic sinusitis, unspecified: Secondary | ICD-10-CM | POA: Diagnosis not present

## 2023-11-22 DIAGNOSIS — L4050 Arthropathic psoriasis, unspecified: Secondary | ICD-10-CM | POA: Diagnosis not present

## 2023-11-22 DIAGNOSIS — M25579 Pain in unspecified ankle and joints of unspecified foot: Secondary | ICD-10-CM | POA: Diagnosis not present

## 2023-11-22 DIAGNOSIS — L409 Psoriasis, unspecified: Secondary | ICD-10-CM | POA: Diagnosis not present

## 2023-11-22 DIAGNOSIS — M2550 Pain in unspecified joint: Secondary | ICD-10-CM | POA: Diagnosis not present

## 2023-11-22 DIAGNOSIS — N3010 Interstitial cystitis (chronic) without hematuria: Secondary | ICD-10-CM | POA: Diagnosis not present

## 2023-11-22 DIAGNOSIS — Z79899 Other long term (current) drug therapy: Secondary | ICD-10-CM | POA: Diagnosis not present

## 2023-11-22 DIAGNOSIS — M79643 Pain in unspecified hand: Secondary | ICD-10-CM | POA: Diagnosis not present

## 2023-11-22 DIAGNOSIS — M1990 Unspecified osteoarthritis, unspecified site: Secondary | ICD-10-CM | POA: Diagnosis not present

## 2023-12-09 DIAGNOSIS — G4700 Insomnia, unspecified: Secondary | ICD-10-CM | POA: Diagnosis not present

## 2023-12-09 DIAGNOSIS — L4050 Arthropathic psoriasis, unspecified: Secondary | ICD-10-CM | POA: Diagnosis not present

## 2023-12-09 DIAGNOSIS — F419 Anxiety disorder, unspecified: Secondary | ICD-10-CM | POA: Diagnosis not present

## 2023-12-09 DIAGNOSIS — M5126 Other intervertebral disc displacement, lumbar region: Secondary | ICD-10-CM | POA: Diagnosis not present

## 2023-12-09 DIAGNOSIS — G894 Chronic pain syndrome: Secondary | ICD-10-CM | POA: Diagnosis not present

## 2023-12-09 DIAGNOSIS — M5010 Cervical disc disorder with radiculopathy, unspecified cervical region: Secondary | ICD-10-CM | POA: Diagnosis not present

## 2023-12-09 DIAGNOSIS — Z6828 Body mass index (BMI) 28.0-28.9, adult: Secondary | ICD-10-CM | POA: Diagnosis not present

## 2023-12-09 DIAGNOSIS — I10 Essential (primary) hypertension: Secondary | ICD-10-CM | POA: Diagnosis not present

## 2023-12-26 ENCOUNTER — Other Ambulatory Visit: Payer: Self-pay

## 2023-12-26 ENCOUNTER — Ambulatory Visit
Admission: RE | Admit: 2023-12-26 | Discharge: 2023-12-26 | Disposition: A | Discharge status: Home / Self Care | Payer: Self-pay | Attending: Family Medicine | Admitting: Family Medicine

## 2023-12-26 VITALS — BP 134/84 | HR 80 | Temp 98.7°F | Resp 20

## 2023-12-26 DIAGNOSIS — N390 Urinary tract infection, site not specified: Secondary | ICD-10-CM | POA: Diagnosis not present

## 2023-12-26 DIAGNOSIS — R300 Dysuria: Secondary | ICD-10-CM | POA: Diagnosis not present

## 2023-12-26 LAB — POCT URINALYSIS DIP (MANUAL ENTRY)
Bilirubin, UA: NEGATIVE
Glucose, UA: NEGATIVE mg/dL
Ketones, POC UA: NEGATIVE mg/dL
Nitrite, UA: NEGATIVE
Protein Ur, POC: NEGATIVE mg/dL
Spec Grav, UA: <=1.005 — AB (ref 1.010–1.025)
Urobilinogen, UA: 0.2 U/dL
pH, UA: 6.5 (ref 5.0–8.0)

## 2023-12-26 MED ORDER — ONDANSETRON 4 MG PO TBDP: 4.0000 mg | ORAL_TABLET | Freq: Three times a day (TID) | ORAL | 0 refills | Status: DC | PRN

## 2023-12-26 MED ORDER — CEPHALEXIN 500 MG PO CAPS: 500.0000 mg | ORAL_CAPSULE | Freq: Two times a day (BID) | ORAL | 0 refills | Status: DC

## 2023-12-26 NOTE — ED Provider Notes (Signed)
 [facility]    CSN: [id number] Arrival date & time: 12/26/23  1200      History   Chief Complaint Chief Complaint  Patient presents with   Flank Pain    Back pain, nausea, bloating, pelvic pain - Entered by patient    HPI Bethany King is a 53 y.o. female.   Patient today with about 3 days of progressively worsening bilateral flank pain, suprapubic pain, nausea, chills, dysuria.  Denies fever, vomiting, bowel changes, hematuria, vaginal symptoms.  States history of interstitial cystitis on [place] , states the symptoms are above and beyond her typical interstitial cystitis symptoms.  Denies vaginal symptoms, concern for STIs.  Not trying anything over-the-counter currently.    Past Medical History:  Diagnosis Date   Allergic rhinitis    Anxiety    Arthritis    ASCUS of cervix with negative high risk HPV 04/20/2022   04/20/22 repeat in 3 years per ASCCP guidelines, 5 year risk CIN 3+ is 0.27%    Bladder pain    Complication of anesthesia    Costochondritis    hx   DDD (degenerative disc disease), lumbar    Depression    GERD (gastroesophageal reflux disease)    Hepatic hemangioma 2006   stable   History of adenomatous polyp of colon    History of colitis    History of gastritis    History of kidney stones    HTN (hypertension)    Irritable bowel syndrome (IBS)    Medullary sponge kidney    left   Migraine    Nephrolithiasis    bilateral-- nonobstructive per CT   PONV (postoperative nausea and vomiting)    Psoriatic arthritis (HCC)    Psoriatic arthritis (HCC)    Spinal stenosis    Urgency of urination     Patient Active Problem List   Diagnosis Date Noted   Fibroid 05/03/2023   Irritable 04/19/2023   Urinary incontinence 04/19/2023   OAB (overactive bladder) 04/19/2023   Vaginal spotting 04/19/2023   ASCUS of cervix with negative high risk HPV 04/20/2022   Early satiety 01/06/2022   Hematochezia 01/06/2022   Bloating 01/06/2022    Hemorrhoids 12/09/2021   Rectal itching 12/09/2021   Overactive bladder 09/09/2020   Encounter for screening fecal occult blood testing 04/09/2020   Encounter for well woman exam with routine gynecological exam 04/09/2020   Hormone replacement therapy (HRT) 04/09/2020   Pelvic pain in female 04/05/2020   Urinary urgency 04/05/2020   Vaginal dryness 02/27/2020   Burning with urination 02/27/2020   Vaginal burning 02/27/2020   Chronic interstitial cystitis 02/09/2020   Menopause 04/04/2019   Screening for colorectal cancer 04/04/2019   Encounter for gynecological examination with Papanicolaou smear of cervix 04/04/2019   Routine cervical smear 04/04/2019   Vaginal dryness, menopausal 04/04/2019   Hot flashes 04/04/2019   Psoriatic arthritis (HCC) 12/26/2018   History of colonic polyps 12/26/2018   Pain of upper abdomen 12/26/2018   Abdominal pain 06/23/2014   Acute gastroenteritis 06/23/2014   Back pain with radiation 10/24/2012   Breast mass 10/09/2012   Seasonal allergies 09/22/2012   Hypokalemia 09/22/2012   Dysmenorrhea 08/30/2012   Sciatica 05/29/2012   Sinusitis 03/02/2012   Back pain 02/10/2012   Menorrhagia 12/30/2011   Shoulder pain, left 12/03/2011   Costochondritis 11/17/2011   Shoulder bursitis 11/17/2011   Recurrent UTI 10/09/2011   Migraine headache 10/08/2011   Obesity 09/04/2011   HTN (hypertension) 07/14/2011   Anxiety 07/14/2011  Diarrhea 05/19/2011   Chest pain 04/30/2011   IBS (irritable bowel syndrome) 09/23/2010   Epigastric pain 09/23/2010   Constipation 01/29/2010   Gastroesophageal reflux disease 01/24/2010    Past Surgical History:  Procedure Laterality Date   ANTERIOR CERVICAL DECOMP/DISCECTOMY FUSION  02/26/2014   C5 - C6; fusion with plate   BIOPSY  01/04/2019   Procedure: BIOPSY;  Surgeon: Golda Claudis PENNER, MD;  Location: [facility];  Service: Endoscopy;;  gastric polyps   BIOPSY  02/10/2022   Procedure: BIOPSY;  Surgeon: Eartha Angelia Sieving, MD;  [facility]: [facility];  Service: Gastroenterology;;   ORIN MEDIATE RELEASE Bilateral right 04-01-2010/  left 05-30-2010   CHOLECYSTECTOMY N/A 07/14/2013   Procedure: LAPAROSCOPIC CHOLECYSTECTOMY;  Surgeon: Oneil DELENA Budge, MD;  Location: [facility];  Service: General;  Laterality: N/A;   COLONOSCOPY  10-13-2010   COLONOSCOPY N/A 01/04/2019   Procedure: COLONOSCOPY;  Surgeon: Golda Claudis PENNER, MD;  Location: [facility];  Service: Endoscopy;  Laterality: N/A;   COLONOSCOPY WITH PROPOFOL  N/A 02/10/2022   Procedure: COLONOSCOPY WITH PROPOFOL ;  Surgeon: Eartha Angelia Sieving, MD;  [facility]: [facility];  Service: Gastroenterology;  Laterality: N/A;  915 ASA 3   CYSTO WITH HYDRODISTENSION N/A 04/02/2015   Procedure: CYSTOSCOPY/HYDRODISTENSION, BLADDER BIOPSY WITH FULGURATION;  Surgeon: Norleen Seltzer, MD;  Location: [facility];  Service: Urology;  Laterality: N/A;   CYSTO WITH HYDRODISTENSION N/A 03/25/2018   Procedure: CYSTOSCOPY/HYDRODISTENSION, INSTILL MARCAIN AND PYRIDIUM ;  Surgeon: Seltzer Norleen, MD;  Location: [facility];  Service: Urology;  Laterality: N/A;   CYSTO WITH HYDRODISTENSION N/A 01/15/2020   Procedure: CYSTOSCOPY/HYDRODISTENSION;  Surgeon: Sherrilee Belvie CROME, MD;  Location: [facility];  Service: Urology;  Laterality: N/A;   CYSTOSCOPY W/ URETERAL STENT PLACEMENT Bilateral 04/08/2016   Procedure: CYSTOSCOPY WITH BILATERAL RETROGRADE PYELOGRAM, BILATERAL URETERAL STENT PLACEMENT;  Surgeon: Belvie CROME Sherrilee, MD;  Location: [facility];  Service: Urology;  Laterality: Bilateral;   CYSTOSCOPY W/ URETERAL STENT PLACEMENT Left 04/15/2016   Procedure: CYSTOSCOPY WITH RETROGRADE PYELOGRAM/URETERAL STENT PLACEMENT;  Surgeon: Belvie CROME Sherrilee, MD;  Location: [facility];  Service: Urology;  Laterality: Left;   CYSTOSCOPY WITH BIOPSY N/A 01/15/2020   Procedure: CYSTOSCOPY WITH BLADDER BIOPSY AND FULGERATION;  Surgeon: Sherrilee Belvie CROME, MD;  Location: [facility];  Service:  Urology;  Laterality: N/A;   CYSTOSCOPY WITH HOLMIUM LASER LITHOTRIPSY Right 04/08/2016   Procedure: CYSTOSCOPY WITH RIGHT RENAL STONE EXTRACTION WITH HOLMIUM LASER LITHOTRIPSY;  Surgeon: Belvie CROME Sherrilee, MD;  Location: [facility];  Service: Urology;  Laterality: Right;   CYSTOSCOPY WITH HOLMIUM LASER LITHOTRIPSY Left 04/15/2016   Procedure: CYSTOSCOPY WITH HOLMIUM LASER LITHOTRIPSY;  Surgeon: Belvie CROME Sherrilee, MD;  Location: [facility];  Service: Urology;  Laterality: Left;   CYSTOSCOPY WITH RETROGRADE PYELOGRAM, URETEROSCOPY AND STENT PLACEMENT Left 07/05/2017   Procedure: CYSTOSCOPY WITH RETROGRADE PYELOGRAM, URETEROSCOPY AND STENT PLACEMENT;  Surgeon: Sherrilee Belvie CROME, MD;  Location: [facility];  Service: Urology;  Laterality: Left;   DIAGNOSTIC LAPAROSCOPY     DILATION AND CURETTAGE OF UTERUS     DILITATION & CURRETTAGE/HYSTROSCOPY WITH THERMACHOICE ABLATION N/A 09/07/2012   Procedure: DILATATION & CURETTAGE/HYSTEROSCOPY WITH THERMACHOICE ABLATION;  Surgeon: Vonn VEAR Inch, MD;  Location: [facility];  Service: Gynecology;  Laterality: N/A;   ESOPHAGOGASTRODUODENOSCOPY  10/14/10   ESOPHAGOGASTRODUODENOSCOPY N/A 01/04/2019   Procedure: ESOPHAGOGASTRODUODENOSCOPY (EGD);  Surgeon: Golda Claudis PENNER, MD;  [facility];  Service: Endoscopy;  Laterality: N/A;  9:30   ESOPHAGOGASTRODUODENOSCOPY (EGD) WITH PROPOFOL   N/A 02/10/2022   Procedure: ESOPHAGOGASTRODUODENOSCOPY (EGD) WITH PROPOFOL ;  Surgeon: Eartha Angelia Sieving, MD;  [facility]: [facility];  Service: Gastroenterology;  Laterality: N/A;   EXTRACORPOREAL SHOCK WAVE LITHOTRIPSY  multiple   HOLMIUM LASER APPLICATION Left 07/05/2017   Procedure: HOLMIUM LASER APPLICATION;  Surgeon: Sherrilee Belvie CROME, MD;  Location: [facility];  Service: Urology;  Laterality: Left;   PERCUTANEOUS NEPHROSTOLITHOTOMY  2003   PLANTAR FASCIA SURGERY Bilateral right 2008//  left 2010   POLYPECTOMY  01/04/2019   Procedure: POLYPECTOMY;  Surgeon: Golda Claudis PENNER, MD;   [facility];  Service: Endoscopy;;  colon   STONE EXTRACTION WITH BASKET Left 04/15/2016   Procedure: STONE EXTRACTION WITH BASKET;  Surgeon: Belvie CROME Sherrilee, MD;  Location: [facility];  Service: Urology;  Laterality: Left;    OB History     Gravida  0   Para      Term      Preterm      AB      Living  0      SAB      IAB      Ectopic      Multiple      Live Births               Home Medications    Prior to Admission medications   Medication Sig Start Date End Date Taking? Authorizing Provider  cephALEXin  (KEFLEX ) 500 MG capsule Take 1 capsule (500 mg total) by mouth 2 (two) times daily. 12/26/23  Yes Stuart Vernell Norris, PA-C  ondansetron  (ZOFRAN -ODT) 4 MG disintegrating tablet Take 1 tablet (4 mg total) by mouth every 8 (eight) hours as needed for nausea or vomiting. 12/26/23  Yes Stuart Vernell Norris, PA-C  ALPRAZolam  (XANAX ) 0.5 MG tablet Take 0.5 mg by mouth 2 (two) times daily as needed for anxiety. 11/10/19   [provider]  Cholecalciferol (VITAMIN D3) 125 MCG (5000 UT) CAPS Take 5,000 Units by mouth daily.    [provider]  DULoxetine (CYMBALTA) 60 MG capsule Take 60 mg by mouth 2 (two) times daily.    [provider]  estradiol  (ESTRACE ) 0.1 MG/GM vaginal cream Use 0.5 gm in vagina 2-3 x weekly Patient not taking: Reported on 11/03/2023 04/19/23   Signa Delon LABOR, NP  estradiol  (ESTRACE ) 1 MG tablet TAKE 1 TABLET(1 MG) BY MOUTH DAILY 04/19/23   Signa Delon LABOR, NP  fluticasone  (FLONASE ) 50 MCG/ACT nasal spray Place 1 spray into both nostrils daily as needed for allergies or rhinitis.    [provider]  HYDROcodone -acetaminophen  (NORCO) 10-325 MG tablet Take 1 tablet by mouth 4 (four) times daily as needed for moderate pain. Patient taking differently: Take 1-2 tablets by mouth every 6 (six) hours as needed for moderate pain (pain score 4-6). 01/15/20   McKenzie, Belvie CROME, MD  hydrOXYzine  (ATARAX ) 10  MG tablet Take 1 tablet (10 mg total) by mouth at bedtime. 03/29/23   McKenzie, Belvie CROME, MD  Loratadine  (CLARITIN  PO) Take by mouth.    [provider]  pentosan polysulfate (ELMIRON ) 100 MG capsule Take 1 capsule (100 mg total) by mouth 2 (two) times daily. 03/29/23   McKenzie, Belvie CROME, MD  potassium chloride  (KLOR-CON ) 10 MEQ tablet Take 40 mEq by mouth daily. 10/25/23   [provider]  progesterone  (PROMETRIUM ) 200 MG capsule TAKE 1 CAPSULE(200 MG) BY MOUTH AT BEDTIME 03/31/23   Signa Delon LABOR, NP  risankizumab-rzaa [facility]) 150 MG/ML SOSY prefilled syringe Inject  into the skin. Every 3 months    [provider]  triamterene-hydrochlorothiazide  (DYAZIDE) 37.5-25 MG capsule Take 1 capsule by mouth daily.  01/09/18   [provider]  Adalimumab (HUMIRA PEN) 40 MG/0.4ML PNKT Inject 40 mg into the skin every 14 (fourteen) days. Twice a month   08/09/19  [provider]  pantoprazole  (PROTONIX ) 40 MG tablet Take 1 tablet (40 mg total) by mouth daily before breakfast. 01/04/19 08/09/19  Golda Claudis PENNER, MD    Family History Family History  Problem Relation Age of Onset   Irritable bowel syndrome Mother    Scleroderma Mother    Rheum arthritis Mother    Asthma Mother    Melanoma Father    Cancer Father        Melanoma   Colon cancer Cousin 45       second cousin Harles Lovelace)   Cancer Maternal Grandmother        Ovarian   Diabetes Maternal Grandmother    Thyroid  disease Maternal Aunt     Social History Social History   Tobacco Use   Smoking status: Never    Passive exposure: Never   Smokeless tobacco: Never  Vaping Use   Vaping status: Never Used  Substance Use Topics   Alcohol use: No   Drug use: No     Allergies   Hydromorphone , Prednisone, Tramadol, and Sulfa antibiotics   Review of Systems Review of Systems Per HPI  Physical Exam Triage Vital Signs ED Triage Vitals  Encounter Vitals Group     BP 12/26/23  1216 134/84     Girls Systolic BP Percentile --      Girls Diastolic BP Percentile --      Boys Systolic BP Percentile --      Boys Diastolic BP Percentile --      Pulse Rate 12/26/23 1216 80     Resp 12/26/23 1216 20     Temp 12/26/23 1216 98.7 F (37.1 C)     Temp Source 12/26/23 1216 Oral     SpO2 12/26/23 1216 97 %     Weight --      Height --      Head Circumference --      Peak Flow --      Pain Score 12/26/23 1213 6     Pain Loc --      Pain Education --      Exclude from Growth Chart --    No data found.  Updated Vital Signs BP 134/84 (BP Location: Right Arm)   Pulse 80   Temp 98.7 F (37.1 C) (Oral)   Resp 20   SpO2 97%   Visual Acuity Right Eye Distance:   Left Eye Distance:   Bilateral Distance:    Right Eye Near:   Left Eye Near:    Bilateral Near:     Physical Exam Vitals and nursing note reviewed.  Constitutional:      Appearance: Normal appearance. She is not ill-appearing.  HENT:     Head: Atraumatic.     Mouth/Throat:     Mouth: Mucous membranes are moist.  Eyes:     Extraocular Movements: Extraocular movements intact.     Conjunctiva/sclera: Conjunctivae normal.  Cardiovascular:     Rate and Rhythm: Normal rate.  Pulmonary:     Effort: Pulmonary effort is normal.  Abdominal:     General: Bowel sounds are normal. There is no distension.     Palpations: Abdomen is soft.  Tenderness: There is no abdominal tenderness. There is no right CVA tenderness, left CVA tenderness or guarding.  Musculoskeletal:        General: Normal range of motion.     Cervical back: Normal range of motion and neck supple.  Skin:    General: Skin is warm and dry.  Neurological:     Mental Status: She is alert and oriented to person, place, and time.  Psychiatric:        Mood and Affect: Mood normal.        Thought Content: Thought content normal.        Judgment: Judgment normal.      UC Treatments / Results  Labs (all labs ordered are listed, but  only abnormal results are displayed) Labs Reviewed  POCT URINALYSIS DIP (MANUAL ENTRY) - Abnormal; Notable for the following components:      Result Value   Spec Grav, UA <=1.005 (*)    Blood, UA small (*)    Leukocytes, UA Trace (*)    All other components within normal limits  URINE CULTURE    EKG   Radiology No results found.  Procedures Procedures (including critical care time)  Medications Ordered in UC Medications - No data to display  Initial Impression / Assessment and Plan / UC Course  I have reviewed the triage vital signs and the nursing notes.  Pertinent labs & imaging results that were available during my care of the patient were reviewed by me and considered in my medical decision making (see chart for details).     Vitals and exam overall reassuring today, urinalysis with trace leuks and small blood and given her history will cover with Keflex  while awaiting urine culture and adjust if needed.  Zofran , fluids, and return precautions reviewed.  Final Clinical Impressions(s) / UC Diagnoses   Final diagnoses:  Dysuria  Acute lower UTI     Discharge Instructions      We have sent out a urine culture and we will let you know if you need to make any changes based on this result.    ED Prescriptions     Medication Sig Dispense Auth. Provider   ondansetron  (ZOFRAN -ODT) 4 MG disintegrating tablet Take 1 tablet (4 mg total) by mouth every 8 (eight) hours as needed for nausea or vomiting. 20 tablet Stuart Vernell Norris, PA-C   cephALEXin  (KEFLEX ) 500 MG capsule Take 1 capsule (500 mg total) by mouth 2 (two) times daily. 10 capsule Stuart Vernell Norris, [place]      PDMP not reviewed this encounter.   Stuart Vernell Norris, [place] 12/26/23 1300

## 2023-12-26 NOTE — Discharge Instructions (Signed)
 We have sent out a urine culture and we will let you know if you need to make any changes based on this result.

## 2023-12-26 NOTE — ED Triage Notes (Signed)
 Pt reports bilateral flank pain, bladder pain, nausea, chills x3 days. Reports hx of kidney stones and interstitial cystitis.

## 2023-12-27 LAB — URINE CULTURE: Culture: NO GROWTH

## 2023-12-28 ENCOUNTER — Ambulatory Visit (HOSPITAL_COMMUNITY): Payer: Self-pay

## 2023-12-30 ENCOUNTER — Ambulatory Visit: Payer: Self-pay

## 2023-12-31 ENCOUNTER — Ambulatory Visit (HOSPITAL_COMMUNITY)
Admission: RE | Admit: 2023-12-31 | Discharge: 2023-12-31 | Disposition: A | Discharge status: Home / Self Care | Source: Ambulatory Visit | Attending: Urology | Admitting: Urology

## 2023-12-31 ENCOUNTER — Encounter: Payer: Self-pay | Admitting: Urology

## 2023-12-31 ENCOUNTER — Other Ambulatory Visit: Payer: Self-pay

## 2023-12-31 ENCOUNTER — Ambulatory Visit: Admitting: Urology

## 2023-12-31 VITALS — BP 151/78 | HR 88

## 2023-12-31 DIAGNOSIS — N200 Calculus of kidney: Secondary | ICD-10-CM

## 2023-12-31 DIAGNOSIS — N2889 Other specified disorders of kidney and ureter: Secondary | ICD-10-CM | POA: Diagnosis not present

## 2023-12-31 LAB — URINALYSIS, ROUTINE W REFLEX MICROSCOPIC
Bilirubin, UA: NEGATIVE
Glucose, UA: NEGATIVE
Ketones, UA: NEGATIVE
Nitrite, UA: NEGATIVE
Protein,UA: NEGATIVE
Specific Gravity, UA: <1.005 — ABNORMAL LOW (ref 1.005–1.030)
Urobilinogen, Ur: 0.2 mg/dL (ref 0.2–1.0)
pH, UA: 6.5 (ref 5.0–7.5)

## 2023-12-31 LAB — MICROSCOPIC EXAMINATION: Epithelial Cells (non renal): >10 /HPF — AB (ref 0–10)

## 2023-12-31 MED ORDER — ONDANSETRON HCL 4 MG PO TABS: 4.0000 mg | ORAL_TABLET | Freq: Three times a day (TID) | ORAL | 0 refills | Status: DC | PRN

## 2023-12-31 MED ORDER — TAMSULOSIN HCL 0.4 MG PO CAPS: 0.4000 mg | ORAL_CAPSULE | Freq: Every day | ORAL | 0 refills | Status: DC

## 2023-12-31 MED ORDER — OXYCODONE-ACETAMINOPHEN 5-325 MG PO TABS
1.0000 | ORAL_TABLET | ORAL | 0 refills | Status: DC | PRN
Start: 2023-12-31 — End: 2024-01-04

## 2023-12-31 NOTE — Progress Notes (Signed)
 12/31/2023 10:52 AM   Bethany King [birth year] [id number]  Referring provider: Bertell Satterfield, MD [address] [place],  [place] [address]  nephrolithiasis   HPI: Bethany King is a 53yo here for followup for nephrolithiasis. Starting 4 days ago she developed left flank pain. She was seen at urgent care and was treated for a UTI, culture was negative. KUb from today shows a 7mm left proximal ureteral calculus and multiple bilateral calculi. She has nausea but no vomiting.    PMH: Past Medical History:  Diagnosis Date   Allergic rhinitis    Anxiety    Arthritis    ASCUS of cervix with negative high risk HPV 04/20/2022   04/20/22 repeat in 3 years per ASCCP guidelines, 5 year risk CIN 3+ is 0.27%    Bladder pain    Complication of anesthesia    Costochondritis    hx   DDD (degenerative disc disease), lumbar    Depression    GERD (gastroesophageal reflux disease)    Hepatic hemangioma 2006   stable   History of adenomatous polyp of colon    History of colitis    History of gastritis    History of kidney stones    HTN (hypertension)    Irritable bowel syndrome (IBS)    Medullary sponge kidney    left   Migraine    Nephrolithiasis    bilateral-- nonobstructive per CT   PONV (postoperative nausea and vomiting)    Psoriatic arthritis (HCC)    Psoriatic arthritis (HCC)    Spinal stenosis    Urgency of urination     Surgical History: Past Surgical History:  Procedure Laterality Date   ANTERIOR CERVICAL DECOMP/DISCECTOMY FUSION  02/26/2014   C5 - C6; fusion with plate   BIOPSY  01/04/2019   Procedure: BIOPSY;  Surgeon: Golda Claudis PENNER, MD;  [facility];  Service: Endoscopy;;  gastric polyps   BIOPSY  02/10/2022   Procedure: BIOPSY;  Surgeon: Eartha Angelia Sieving, MD;  [facility];  Service: Gastroenterology;;   ORIN MEDIATE RELEASE Bilateral right 04-01-2010/  left 05-30-2010   CHOLECYSTECTOMY N/A 07/14/2013   Procedure:  LAPAROSCOPIC CHOLECYSTECTOMY;  Surgeon: Oneil DELENA Budge, MD;  Location: [facility];  Service: General;  Laterality: N/A;   COLONOSCOPY  10-13-2010   COLONOSCOPY N/A 01/04/2019   Procedure: COLONOSCOPY;  Surgeon: Golda Claudis PENNER, MD;  [facility];  Service: Endoscopy;  Laterality: N/A;   COLONOSCOPY WITH PROPOFOL  N/A 02/10/2022   Procedure: COLONOSCOPY WITH PROPOFOL ;  Surgeon: Eartha Angelia Sieving, MD;  [facility];  Service: Gastroenterology;  Laterality: N/A;  915 [address]SA 3   CYSTO WITH HYDRODISTENSION N/A 04/02/2015   Procedure: CYSTOSCOPY/HYDRODISTENSION, BLADDER BIOPSY WITH FULGURATION;  Surgeon: Norleen Seltzer, MD;  Location: [facility];  Service: Urology;  Laterality: N/A;   CYSTO WITH HYDRODISTENSION N/A 03/25/2018   Procedure: CYSTOSCOPY/HYDRODISTENSION, INSTILL MARCAIN AND PYRIDIUM ;  Surgeon: Seltzer Norleen, MD;  Location: [facility];  Service: Urology;  Laterality: N/A;   CYSTO WITH HYDRODISTENSION N/A 01/15/2020   Procedure: CYSTOSCOPY/HYDRODISTENSION;  Surgeon: Sherrilee Belvie CROME, MD;  Location: [facility];  Service: Urology;  Laterality: N/A;   CYSTOSCOPY W/ URETERAL STENT PLACEMENT Bilateral 04/08/2016   Procedure: CYSTOSCOPY WITH BILATERAL RETROGRADE PYELOGRAM, BILATERAL URETERAL STENT PLACEMENT;  Surgeon: Belvie CROME Sherrilee, MD;  Location: [facility];  Service: Urology;  Laterality: Bilateral;   CYSTOSCOPY W/ URETERAL STENT PLACEMENT Left 04/15/2016   Procedure: CYSTOSCOPY WITH RETROGRADE PYELOGRAM/URETERAL STENT PLACEMENT;  Surgeon: Belvie LITTIE Clara, MD;  Location: [facility];  Service: Urology;  Laterality: Left;   CYSTOSCOPY WITH BIOPSY N/A 01/15/2020   Procedure: CYSTOSCOPY WITH BLADDER BIOPSY AND FULGERATION;  Surgeon: Clara Belvie LITTIE, MD;  Location: [facility];  Service: Urology;  Laterality: N/A;   CYSTOSCOPY WITH HOLMIUM LASER LITHOTRIPSY Right 04/08/2016   Procedure: CYSTOSCOPY WITH RIGHT RENAL STONE EXTRACTION WITH HOLMIUM LASER LITHOTRIPSY;  Surgeon: Belvie LITTIE Clara, MD;  [facility]: [facility];  Service: Urology;  Laterality: Right;   CYSTOSCOPY WITH HOLMIUM LASER LITHOTRIPSY Left 04/15/2016   Procedure: CYSTOSCOPY WITH HOLMIUM LASER LITHOTRIPSY;  Surgeon: Belvie LITTIE Clara, MD;  [facility]: [facility];  Service: Urology;  Laterality: Left;   CYSTOSCOPY WITH RETROGRADE PYELOGRAM, URETEROSCOPY AND STENT PLACEMENT Left 07/05/2017   Procedure: CYSTOSCOPY WITH RETROGRADE PYELOGRAM, URETEROSCOPY AND STENT PLACEMENT;  Surgeon: Clara Belvie LITTIE, MD;  Location: [facility];  Service: Urology;  Laterality: Left;   DIAGNOSTIC LAPAROSCOPY     DILATION AND CURETTAGE OF UTERUS     DILITATION & CURRETTAGE/HYSTROSCOPY WITH THERMACHOICE ABLATION N/A 09/07/2012   Procedure: DILATATION & CURETTAGE/HYSTEROSCOPY WITH THERMACHOICE ABLATION;  Surgeon: Vonn VEAR Inch, MD;  Location: [facility];  Service: Gynecology;  Laterality: N/A;   ESOPHAGOGASTRODUODENOSCOPY  10/14/10   ESOPHAGOGASTRODUODENOSCOPY N/A 01/04/2019   Procedure: ESOPHAGOGASTRODUODENOSCOPY (EGD);  Surgeon: Golda Claudis PENNER, MD;  [facility];  Service: Endoscopy;  Laterality: N/A;  9:30   ESOPHAGOGASTRODUODENOSCOPY (EGD) WITH PROPOFOL  N/A 02/10/2022   Procedure: ESOPHAGOGASTRODUODENOSCOPY (EGD) WITH PROPOFOL ;  Surgeon: Eartha Angelia Sieving, MD;  [facility];  Service: Gastroenterology;  Laterality: N/A;   EXTRACORPOREAL SHOCK WAVE LITHOTRIPSY  multiple   HOLMIUM LASER APPLICATION Left 07/05/2017   Procedure: HOLMIUM LASER APPLICATION;  Surgeon: Clara Belvie LITTIE, MD;  Location: [facility];  Service: Urology;  Laterality: Left;   PERCUTANEOUS NEPHROSTOLITHOTOMY  2003   PLANTAR FASCIA SURGERY Bilateral right 2008//  left 2010   POLYPECTOMY  01/04/2019   Procedure: POLYPECTOMY;  Surgeon: Golda Claudis PENNER, MD;  [facility];  Service: Endoscopy;;  colon   STONE EXTRACTION WITH BASKET Left 04/15/2016   Procedure: STONE EXTRACTION WITH BASKET;  Surgeon: Belvie LITTIE Clara, MD;  Location: [facility];   Service: Urology;  Laterality: Left;    Home Medications:  Allergies as of 12/31/2023       Reactions   Hydromorphone  Itching   Prednisone Other (See Comments)   Increase anxiety symptoms, insomnia   Tramadol Other (See Comments)   Causes dizziness   Sulfa Antibiotics Rash        Medication List        Accurate as of December 31, 2023 10:52 AM. If you have any questions, ask your nurse or doctor.          ALPRAZolam  0.5 MG tablet Commonly known as: XANAX  Take 0.5 mg by mouth 2 (two) times daily as needed for anxiety.   cephALEXin  500 MG capsule Commonly known as: KEFLEX  Take 1 capsule (500 mg total) by mouth 2 (two) times daily.   CLARITIN  PO Take by mouth.   DULoxetine 60 MG capsule Commonly known as: CYMBALTA Take 60 mg by mouth 2 (two) times daily.   Elmiron  100 MG capsule Generic drug: pentosan polysulfate Take 1 capsule (100 mg total) by mouth 2 (two) times daily.   estradiol  0.1 MG/GM vaginal cream Commonly known as: ESTRACE  Use 0.5 gm in vagina 2-3 x weekly   estradiol  1 MG tablet Commonly known as: ESTRACE  TAKE 1 TABLET(1 MG)  BY MOUTH DAILY   fluticasone  50 MCG/ACT nasal spray Commonly known as: FLONASE  Place 1 spray into both nostrils daily as needed for allergies or rhinitis.   HYDROcodone -acetaminophen  10-325 MG tablet Commonly known as: NORCO Take 1 tablet by mouth 4 (four) times daily as needed for moderate pain. What changed:  how much to take when to take this   hydrOXYzine  10 MG tablet Commonly known as: ATARAX  Take 1 tablet (10 mg total) by mouth at bedtime.   ondansetron  4 MG disintegrating tablet Commonly known as: ZOFRAN -ODT Take 1 tablet (4 mg total) by mouth every 8 (eight) hours as needed for nausea or vomiting.   potassium chloride  10 MEQ tablet Commonly known as: KLOR-CON  Take 40 mEq by mouth daily.   progesterone  200 MG capsule Commonly known as: PROMETRIUM  TAKE 1 CAPSULE(200 MG) BY MOUTH AT BEDTIME   Skyrizi 150  MG/ML Sosy prefilled syringe Generic drug: risankizumab-rzaa Inject into the skin. Every 3 months   triamterene-hydrochlorothiazide  37.5-25 MG capsule Commonly known as: DYAZIDE Take 1 capsule by mouth daily.   Vitamin D3 125 MCG (5000 UT) Caps Take 5,000 Units by mouth daily.        Allergies:  Allergies  Allergen Reactions   Hydromorphone  Itching   Prednisone Other (See Comments)    Increase anxiety symptoms, insomnia   Tramadol Other (See Comments)    Causes dizziness    Sulfa Antibiotics Rash    Family History: Family History  Problem Relation Age of Onset   Irritable bowel syndrome Mother    Scleroderma Mother    Rheum arthritis Mother    Asthma Mother    Melanoma Father    Cancer Father        Melanoma   Colon cancer Cousin 7       second cousin Harles Lovelace)   Cancer Maternal Grandmother        Ovarian   Diabetes Maternal Grandmother    Thyroid  disease Maternal Aunt     Social History:  reports that she has never smoked. She has never been exposed to tobacco smoke. She has never used smokeless tobacco. She reports that she does not drink alcohol and does not use drugs.  ROS: All other review of systems were reviewed and are negative except what is noted above in HPI  Physical Exam: BP (!) 151/78   Pulse 88   Constitutional:  Alert and oriented, No acute distress. HEENT: [place] AT, moist mucus membranes.  Trachea midline, no masses. Cardiovascular: No clubbing, cyanosis, or edema. Respiratory: Normal respiratory effort, no increased work of breathing. GI: Abdomen is soft, nontender, nondistended, no abdominal masses GU: No CVA tenderness.  Lymph: No cervical or inguinal lymphadenopathy. Skin: No rashes, bruises or suspicious lesions. Neurologic: Grossly intact, no focal deficits, moving all 4 extremities. Psychiatric: Normal mood and affect.  Laboratory Data: Lab Results  Component Value Date   WBC 7.0 03/21/2018   HGB 10.9 (L) 03/21/2018    HCT 35.3 (L) 03/21/2018   MCV 96.7 03/21/2018   PLT 330 03/21/2018    Lab Results  Component Value Date   CREATININE 0.73 02/05/2022    No results found for: PSA  No results found for: TESTOSTERONE  Lab Results  Component Value Date   HGBA1C 5.7 (H) 09/15/2011    Urinalysis    Component Value Date/Time   COLORURINE AMBER (A) 11/17/2019 1600   APPEARANCEUR Clear 03/29/2023 1339   LABSPEC 1.004 (L) 11/17/2019 1600   PHURINE 6.0 11/17/2019 1600  GLUCOSEU Negative 03/29/2023 1339   HGBUR SMALL (A) 11/17/2019 1600   BILIRUBINUR negative 12/26/2023 1231   BILIRUBINUR Negative 03/29/2023 1339   KETONESUR negative 12/26/2023 1231   KETONESUR NEGATIVE 11/17/2019 1600   PROTEINUR negative 12/26/2023 1231   PROTEINUR Negative 03/29/2023 1339   PROTEINUR NEGATIVE 11/17/2019 1600   UROBILINOGEN 0.2 12/26/2023 1231   UROBILINOGEN 0.2 02/02/2013 1620   NITRITE Negative 12/26/2023 1231   NITRITE Negative 03/29/2023 1339   NITRITE POSITIVE (A) 11/17/2019 1600   LEUKOCYTESUR Trace (A) 12/26/2023 1231   LEUKOCYTESUR 1+ (A) 03/29/2023 1339   LEUKOCYTESUR LARGE (A) 11/17/2019 1600    Lab Results  Component Value Date   LABMICR See below: 03/29/2023   WBCUA 6-10 (A) 03/29/2023   LABEPIT 0-10 03/29/2023   MUCUS Present 10/01/2021   BACTERIA Few (A) 03/29/2023    Pertinent Imaging: KUb today: Images reviewed and discussed with the patient  Results for orders placed during the hospital encounter of 03/29/23  Abdomen 1 view (KUB)  Narrative CLINICAL DATA:  Nephrolithiasis  EXAM: ABDOMEN - 1 VIEW  COMPARISON:  10/05/2022  FINDINGS: The bowel gas pattern is normal. Calcifications overlying both kidneys could represent stones. Noncontrast CT recommended for further evaluation.  IMPRESSION: Possible bilateral nephrolithiasis. CT recommended further assessment.   Electronically Signed By: Fonda Field M.D. On: 04/14/2023 15:32  No results found for this or  any previous visit.  No results found for this or any previous visit.  No results found for this or any previous visit.  Results for orders placed during the hospital encounter of 09/01/17  US  RENAL  Narrative CLINICAL DATA:  Kidney stones  EXAM: RENAL / URINARY TRACT ULTRASOUND COMPLETE  COMPARISON:  Abdominal ultrasound of August 03, 2017  FINDINGS: Right Kidney:  Length: 10.7 cm. There is cortical thinning diffusely. The cortical echotexture remains lower than that of the adjacent liver. There is a nonobstructing mid/upper pole stone measuring approximately 7 mm in diameter. There is no hydronephrosis.  Left Kidney:  Length: 11 cm. There is diffuse cortical thinning similar to that on the right. There is a mid to upper pole stone measuring 6 mm in diameter. There is no hydronephrosis.  Bladder:  Appears normal for degree of bladder distention. Bilateral ureteral jets are observed.  IMPRESSION: Bilateral nonobstructing kidney stones. Diffuse renal cortical thinning. No hydronephrosis. Normal appearing urinary bladder.   Electronically Signed By: David  [place] M.D. On: 09/01/2017 10:23  No results found for this or any previous visit.  No results found for this or any previous visit.  No results found for this or any previous visit.   Assessment & Plan:    1. Renal calculus (Primary) -We discussed the management of kidney stones. These options include observation, ureteroscopy, shockwave lithotripsy (ESWL) and percutaneous nephrolithotomy (PCNL). We discussed which options are relevant to the patient's stone(s). We discussed the natural history of kidney stones as well as the complications of untreated stones and the impact on quality of life without treatment as well as with each of the above listed treatments. We also discussed the efficacy of each treatment in its ability to clear the stone burden. With any of these management options I discussed the  signs and symptoms of infection and the need for emergent treatment should these be experienced. For each option we discussed the ability of each procedure to clear the patient of their stone burden.   For observation I described the risks which include but are not limited to silent renal  damage, life-threatening infection, need for emergent surgery, failure to pass stone and pain.   For ureteroscopy I described the risks which include bleeding, infection, damage to contiguous structures, positioning injury, ureteral stricture, ureteral avulsion, ureteral injury, need for prolonged ureteral stent, inability to perform ureteroscopy, need for an interval procedure, inability to clear stone burden, stent discomfort/pain, heart attack, stroke, pulmonary embolus and the inherent risks with general anesthesia.   For shockwave lithotripsy I described the risks which include arrhythmia, kidney contusion, kidney hemorrhage, need for transfusion, pain, inability to adequately break up stone, inability to pass stone fragments, Steinstrasse, infection associated with obstructing stones, need for alternate surgical procedure, need for repeat shockwave lithotripsy, MI, CVA, PE and the inherent risks with anesthesia/conscious sedation.   For PCNL I described the risks including positioning injury, pneumothorax, hydrothorax, need for chest tube, inability to clear stone burden, renal laceration, arterial venous fistula or malformation, need for embolization of kidney, loss of kidney or renal function, need for repeat procedure, need for prolonged nephrostomy tube, ureteral avulsion, MI, CVA, PE and the inherent risks of general anesthesia.   - The patient would like to proceed with left ESWL - Urinalysis, Routine w reflex microscopic   No follow-ups on file.  [name], MD  [facility] [place]

## 2023-12-31 NOTE — H&P (View-Only) (Signed)
 12/31/2023 10:52 AM   Eleanor A Maxton [birth year] [id number]  Referring provider: Bertell Satterfield, MD [address] [place],  [place] [address]  nephrolithiasis   HPI: [name] is a 53yo here for followup for nephrolithiasis. Starting 4 days ago she developed left flank pain. She was seen at urgent care and was treated for a UTI, culture was negative. KUb from today shows a 7mm left proximal ureteral calculus and multiple bilateral calculi. She has nausea but no vomiting.    PMH: Past Medical History:  Diagnosis Date   Allergic rhinitis    Anxiety    Arthritis    ASCUS of cervix with negative high risk HPV 04/20/2022   04/20/22 repeat in 3 years per ASCCP guidelines, 5 year risk CIN 3+ is 0.27%    Bladder pain    Complication of anesthesia    Costochondritis    hx   DDD (degenerative disc disease), lumbar    Depression    GERD (gastroesophageal reflux disease)    Hepatic hemangioma 2006   stable   History of adenomatous polyp of colon    History of colitis    History of gastritis    History of kidney stones    HTN (hypertension)    Irritable bowel syndrome (IBS)    Medullary sponge kidney    left   Migraine    Nephrolithiasis    bilateral-- nonobstructive per CT   PONV (postoperative nausea and vomiting)    Psoriatic arthritis (HCC)    Psoriatic arthritis (HCC)    Spinal stenosis    Urgency of urination     Surgical History: Past Surgical History:  Procedure Laterality Date   ANTERIOR CERVICAL DECOMP/DISCECTOMY FUSION  02/26/2014   C5 - C6; fusion with plate   BIOPSY  01/04/2019   Procedure: BIOPSY;  Surgeon: Golda Claudis PENNER, MD;  [facility];  Service: Endoscopy;;  gastric polyps   BIOPSY  02/10/2022   Procedure: BIOPSY;  Surgeon: Eartha Angelia Sieving, MD;  [facility];  Service: Gastroenterology;;   ORIN MEDIATE RELEASE Bilateral right 04-01-2010/  left 05-30-2010   CHOLECYSTECTOMY N/A 07/14/2013   Procedure:  LAPAROSCOPIC CHOLECYSTECTOMY;  Surgeon: Oneil DELENA Budge, MD;  Location: [facility];  Service: General;  Laterality: N/A;   COLONOSCOPY  10-13-2010   COLONOSCOPY N/A 01/04/2019   Procedure: COLONOSCOPY;  Surgeon: Golda Claudis PENNER, MD;  [facility];  Service: Endoscopy;  Laterality: N/A;   COLONOSCOPY WITH PROPOFOL  N/A 02/10/2022   Procedure: COLONOSCOPY WITH PROPOFOL ;  Surgeon: Eartha Angelia Sieving, MD;  [facility];  Service: Gastroenterology;  Laterality: N/A;  [address]SA 3   CYSTO WITH HYDRODISTENSION N/A 04/02/2015   Procedure: CYSTOSCOPY/HYDRODISTENSION, BLADDER BIOPSY WITH FULGURATION;  Surgeon: Norleen Seltzer, MD;  Location: [facility];  Service: Urology;  Laterality: N/A;   CYSTO WITH HYDRODISTENSION N/A 03/25/2018   Procedure: CYSTOSCOPY/HYDRODISTENSION, INSTILL MARCAIN AND PYRIDIUM ;  Surgeon: Seltzer Norleen, MD;  Location: [facility];  Service: Urology;  Laterality: N/A;   CYSTO WITH HYDRODISTENSION N/A 01/15/2020   Procedure: CYSTOSCOPY/HYDRODISTENSION;  Surgeon: Sherrilee Belvie CROME, MD;  Location: [facility];  Service: Urology;  Laterality: N/A;   CYSTOSCOPY W/ URETERAL STENT PLACEMENT Bilateral 04/08/2016   Procedure: CYSTOSCOPY WITH BILATERAL RETROGRADE PYELOGRAM, BILATERAL URETERAL STENT PLACEMENT;  Surgeon: Belvie CROME Sherrilee, MD;  Location: [facility];  Service: Urology;  Laterality: Bilateral;   CYSTOSCOPY W/ URETERAL STENT PLACEMENT Left 04/15/2016   Procedure: CYSTOSCOPY WITH RETROGRADE PYELOGRAM/URETERAL STENT PLACEMENT;  Surgeon: Belvie LITTIE Clara, MD;  Location: [facility];  Service: Urology;  Laterality: Left;   CYSTOSCOPY WITH BIOPSY N/A 01/15/2020   Procedure: CYSTOSCOPY WITH BLADDER BIOPSY AND FULGERATION;  Surgeon: Clara Belvie LITTIE, MD;  [facility];  Service: Urology;  Laterality: N/A;   CYSTOSCOPY WITH HOLMIUM LASER LITHOTRIPSY Right 04/08/2016   Procedure: CYSTOSCOPY WITH RIGHT RENAL STONE EXTRACTION WITH HOLMIUM LASER LITHOTRIPSY;  Surgeon: Belvie LITTIE Clara, MD;  [facility];  Service: Urology;  Laterality: Right;   CYSTOSCOPY WITH HOLMIUM LASER LITHOTRIPSY Left 04/15/2016   Procedure: CYSTOSCOPY WITH HOLMIUM LASER LITHOTRIPSY;  Surgeon: Belvie LITTIE Clara, MD;  [facility];  Service: Urology;  Laterality: Left;   CYSTOSCOPY WITH RETROGRADE PYELOGRAM, URETEROSCOPY AND STENT PLACEMENT Left 07/05/2017   Procedure: CYSTOSCOPY WITH RETROGRADE PYELOGRAM, URETEROSCOPY AND STENT PLACEMENT;  Surgeon: Clara Belvie LITTIE, MD;  [facility]: [facility];  Service: Urology;  Laterality: Left;   DIAGNOSTIC LAPAROSCOPY     DILATION AND CURETTAGE OF UTERUS     DILITATION & CURRETTAGE/HYSTROSCOPY WITH THERMACHOICE ABLATION N/A 09/07/2012   Procedure: DILATATION & CURETTAGE/HYSTEROSCOPY WITH THERMACHOICE ABLATION;  Surgeon: Vonn VEAR Inch, MD;  Location: [facility];  Service: Gynecology;  Laterality: N/A;   ESOPHAGOGASTRODUODENOSCOPY  10/14/10   ESOPHAGOGASTRODUODENOSCOPY N/A 01/04/2019   Procedure: ESOPHAGOGASTRODUODENOSCOPY (EGD);  Surgeon: Golda Claudis PENNER, MD;  [facility];  Service: Endoscopy;  Laterality: N/A;  9:30   ESOPHAGOGASTRODUODENOSCOPY (EGD) WITH PROPOFOL  N/A 02/10/2022   Procedure: ESOPHAGOGASTRODUODENOSCOPY (EGD) WITH PROPOFOL ;  Surgeon: Eartha Angelia Sieving, MD;  [facility];  Service: Gastroenterology;  Laterality: N/A;   EXTRACORPOREAL SHOCK WAVE LITHOTRIPSY  multiple   HOLMIUM LASER APPLICATION Left 07/05/2017   Procedure: HOLMIUM LASER APPLICATION;  Surgeon: Clara Belvie LITTIE, MD;  Location: [facility];  Service: Urology;  Laterality: Left;   PERCUTANEOUS NEPHROSTOLITHOTOMY  2003   PLANTAR FASCIA SURGERY Bilateral right 2008//  left 2010   POLYPECTOMY  01/04/2019   Procedure: POLYPECTOMY;  Surgeon: Golda Claudis PENNER, MD;  [facility];  Service: Endoscopy;;  colon   STONE EXTRACTION WITH BASKET Left 04/15/2016   Procedure: STONE EXTRACTION WITH BASKET;  Surgeon: Belvie LITTIE Clara, MD;  Location: [facility];   Service: Urology;  Laterality: Left;    Home Medications:  Allergies as of 12/31/2023       Reactions   Hydromorphone  Itching   Prednisone Other (See Comments)   Increase anxiety symptoms, insomnia   Tramadol Other (See Comments)   Causes dizziness   Sulfa Antibiotics Rash        Medication List        Accurate as of December 31, 2023 10:52 AM. If you have any questions, ask your nurse or doctor.          ALPRAZolam  0.5 MG tablet Commonly known as: XANAX  Take 0.5 mg by mouth 2 (two) times daily as needed for anxiety.   cephALEXin  500 MG capsule Commonly known as: KEFLEX  Take 1 capsule (500 mg total) by mouth 2 (two) times daily.   CLARITIN  PO Take by mouth.   DULoxetine 60 MG capsule Commonly known as: CYMBALTA Take 60 mg by mouth 2 (two) times daily.   Elmiron  100 MG capsule Generic drug: pentosan polysulfate Take 1 capsule (100 mg total) by mouth 2 (two) times daily.   estradiol  0.1 MG/GM vaginal cream Commonly known as: ESTRACE  Use 0.5 gm in vagina 2-3 x weekly   estradiol  1 MG tablet Commonly known as: ESTRACE  TAKE 1 TABLET(1 MG)  BY MOUTH DAILY   fluticasone  50 MCG/ACT nasal spray Commonly known as: FLONASE  Place 1 spray into both nostrils daily as needed for allergies or rhinitis.   HYDROcodone -acetaminophen  10-325 MG tablet Commonly known as: NORCO Take 1 tablet by mouth 4 (four) times daily as needed for moderate pain. What changed:  how much to take when to take this   hydrOXYzine  10 MG tablet Commonly known as: ATARAX  Take 1 tablet (10 mg total) by mouth at bedtime.   ondansetron  4 MG disintegrating tablet Commonly known as: ZOFRAN -ODT Take 1 tablet (4 mg total) by mouth every 8 (eight) hours as needed for nausea or vomiting.   potassium chloride  10 MEQ tablet Commonly known as: KLOR-CON  Take 40 mEq by mouth daily.   progesterone  200 MG capsule Commonly known as: PROMETRIUM  TAKE 1 CAPSULE(200 MG) BY MOUTH AT BEDTIME   Skyrizi 150  MG/ML Sosy prefilled syringe Generic drug: risankizumab-rzaa Inject into the skin. Every 3 months   triamterene-hydrochlorothiazide  37.5-25 MG capsule Commonly known as: DYAZIDE Take 1 capsule by mouth daily.   Vitamin D3 125 MCG (5000 UT) Caps Take 5,000 Units by mouth daily.        Allergies:  Allergies  Allergen Reactions   Hydromorphone  Itching   Prednisone Other (See Comments)    Increase anxiety symptoms, insomnia   Tramadol Other (See Comments)    Causes dizziness    Sulfa Antibiotics Rash    Family History: Family History  Problem Relation Age of Onset   Irritable bowel syndrome Mother    Scleroderma Mother    Rheum arthritis Mother    Asthma Mother    Melanoma Father    Cancer Father        Melanoma   Colon cancer Cousin 7       second cousin Harles Lovelace)   Cancer Maternal Grandmother        Ovarian   Diabetes Maternal Grandmother    Thyroid  disease Maternal Aunt     Social History:  reports that she has never smoked. She has never been exposed to tobacco smoke. She has never used smokeless tobacco. She reports that she does not drink alcohol and does not use drugs.  ROS: All other review of systems were reviewed and are negative except what is noted above in HPI  Physical Exam: BP (!) 151/78   Pulse 88   Constitutional:  Alert and oriented, No acute distress. HEENT: [place] AT, moist mucus membranes.  Trachea midline, no masses. Cardiovascular: No clubbing, cyanosis, or edema. Respiratory: Normal respiratory effort, no increased work of breathing. GI: Abdomen is soft, nontender, nondistended, no abdominal masses GU: No CVA tenderness.  Lymph: No cervical or inguinal lymphadenopathy. Skin: No rashes, bruises or suspicious lesions. Neurologic: Grossly intact, no focal deficits, moving all 4 extremities. Psychiatric: Normal mood and affect.  Laboratory Data: Lab Results  Component Value Date   WBC 7.0 03/21/2018   HGB 10.9 (L) 03/21/2018    HCT 35.3 (L) 03/21/2018   MCV 96.7 03/21/2018   PLT 330 03/21/2018    Lab Results  Component Value Date   CREATININE 0.73 02/05/2022    No results found for: PSA  No results found for: TESTOSTERONE  Lab Results  Component Value Date   HGBA1C 5.7 (H) 09/15/2011    Urinalysis    Component Value Date/Time   COLORURINE AMBER (A) 11/17/2019 1600   APPEARANCEUR Clear 03/29/2023 1339   LABSPEC 1.004 (L) 11/17/2019 1600   PHURINE 6.0 11/17/2019 1600  GLUCOSEU Negative 03/29/2023 1339   HGBUR SMALL (A) 11/17/2019 1600   BILIRUBINUR negative 12/26/2023 1231   BILIRUBINUR Negative 03/29/2023 1339   KETONESUR negative 12/26/2023 1231   KETONESUR NEGATIVE 11/17/2019 1600   PROTEINUR negative 12/26/2023 1231   PROTEINUR Negative 03/29/2023 1339   PROTEINUR NEGATIVE 11/17/2019 1600   UROBILINOGEN 0.2 12/26/2023 1231   UROBILINOGEN 0.2 02/02/2013 1620   NITRITE Negative 12/26/2023 1231   NITRITE Negative 03/29/2023 1339   NITRITE POSITIVE (A) 11/17/2019 1600   LEUKOCYTESUR Trace (A) 12/26/2023 1231   LEUKOCYTESUR 1+ (A) 03/29/2023 1339   LEUKOCYTESUR LARGE (A) 11/17/2019 1600    Lab Results  Component Value Date   LABMICR See below: 03/29/2023   WBCUA 6-10 (A) 03/29/2023   LABEPIT 0-10 03/29/2023   MUCUS Present 10/01/2021   BACTERIA Few (A) 03/29/2023    Pertinent Imaging: KUb today: Images reviewed and discussed with the patient  Results for orders placed during the hospital encounter of 03/29/23  Abdomen 1 view (KUB)  Narrative CLINICAL DATA:  Nephrolithiasis  EXAM: ABDOMEN - 1 VIEW  COMPARISON:  10/05/2022  FINDINGS: The bowel gas pattern is normal. Calcifications overlying both kidneys could represent stones. Noncontrast CT recommended for further evaluation.  IMPRESSION: Possible bilateral nephrolithiasis. CT recommended further assessment.   Electronically Signed By: Fonda Field M.D. On: 04/14/2023 15:32  No results found for this or  any previous visit.  No results found for this or any previous visit.  No results found for this or any previous visit.  Results for orders placed during the hospital encounter of 09/01/17  US  RENAL  Narrative CLINICAL DATA:  Kidney stones  EXAM: RENAL / URINARY TRACT ULTRASOUND COMPLETE  COMPARISON:  Abdominal ultrasound of August 03, 2017  FINDINGS: Right Kidney:  Length: 10.7 cm. There is cortical thinning diffusely. The cortical echotexture remains lower than that of the adjacent liver. There is a nonobstructing mid/upper pole stone measuring approximately 7 mm in diameter. There is no hydronephrosis.  Left Kidney:  Length: 11 cm. There is diffuse cortical thinning similar to that on the right. There is a mid to upper pole stone measuring 6 mm in diameter. There is no hydronephrosis.  Bladder:  Appears normal for degree of bladder distention. Bilateral ureteral jets are observed.  IMPRESSION: Bilateral nonobstructing kidney stones. Diffuse renal cortical thinning. No hydronephrosis. Normal appearing urinary bladder.   Electronically Signed By: David  [place] M.D. On: 09/01/2017 10:23  No results found for this or any previous visit.  No results found for this or any previous visit.  No results found for this or any previous visit.   Assessment & Plan:    1. Renal calculus (Primary) -We discussed the management of kidney stones. These options include observation, ureteroscopy, shockwave lithotripsy (ESWL) and percutaneous nephrolithotomy (PCNL). We discussed which options are relevant to the patient's stone(s). We discussed the natural history of kidney stones as well as the complications of untreated stones and the impact on quality of life without treatment as well as with each of the above listed treatments. We also discussed the efficacy of each treatment in its ability to clear the stone burden. With any of these management options I discussed the  signs and symptoms of infection and the need for emergent treatment should these be experienced. For each option we discussed the ability of each procedure to clear the patient of their stone burden.   For observation I described the risks which include but are not limited to silent renal  damage, life-threatening infection, need for emergent surgery, failure to pass stone and pain.   For ureteroscopy I described the risks which include bleeding, infection, damage to contiguous structures, positioning injury, ureteral stricture, ureteral avulsion, ureteral injury, need for prolonged ureteral stent, inability to perform ureteroscopy, need for an interval procedure, inability to clear stone burden, stent discomfort/pain, heart attack, stroke, pulmonary embolus and the inherent risks with general anesthesia.   For shockwave lithotripsy I described the risks which include arrhythmia, kidney contusion, kidney hemorrhage, need for transfusion, pain, inability to adequately break up stone, inability to pass stone fragments, Steinstrasse, infection associated with obstructing stones, need for alternate surgical procedure, need for repeat shockwave lithotripsy, MI, CVA, PE and the inherent risks with anesthesia/conscious sedation.   For PCNL I described the risks including positioning injury, pneumothorax, hydrothorax, need for chest tube, inability to clear stone burden, renal laceration, arterial venous fistula or malformation, need for embolization of kidney, loss of kidney or renal function, need for repeat procedure, need for prolonged nephrostomy tube, ureteral avulsion, MI, CVA, PE and the inherent risks of general anesthesia.   - The patient would like to proceed with left ESWL - Urinalysis, Routine w reflex microscopic   No follow-ups on file.  [name], MD  [facility]

## 2023-12-31 NOTE — Patient Instructions (Signed)
 ESWL for Kidney Stones  Extracorporeal shock wave lithotripsy (ESWL) is a treatment that can help break up kidney stones that are too large to pass on their own.  This is a nonsurgical procedure that breaks up a kidney stone with shock waves. These shock waves pass through your body and focus on the kidney stone. They cause the kidney stone to break into smaller pieces (fragments) while it is still in the urinary tract. The fragments of stone can pass more easily out of your body in the pee (urine). Tell a health care provider about: Any allergies you have. All medicines you are taking, including vitamins, herbs, eye drops, creams, and over-the-counter medicines. Any problems you or family members have had with anesthetic medicines. Any bleeding problems you have. Any surgeries you've had. Any medical conditions you have. Whether you're pregnant or may be pregnant. What are the risks? Your health care provider will talk with you about risks. These may include: Infection. Bleeding from the kidney. Bruising of the kidney or skin. Scarring of the kidney. This can lead to: Increased blood pressure. Poor kidney function. Return (recurrence) of kidney stones. Damage to other structures or organs. This may include the liver, colon, spleen, or pancreas. Blockage (obstruction) of the tube that carries pee from the kidney to the bladder (ureter). Failure of the kidney stone to break into fragments. What happens before the procedure? When to stop eating and drinking Follow instructions from your health care provider about what you may eat and drink. These may include: 8 hours before your procedure Stop eating most foods. Do not eat meat, fried foods, or fatty foods. Eat only light foods, such as toast or crackers. All liquids are okay except energy drinks and alcohol. 6 hours before your procedure Stop eating. Drink only clear liquids, such as water, clear fruit juice, black coffee, plain tea,  and sports drinks. Do not drink energy drinks or alcohol. 2 hours before your procedure Stop drinking all liquids. You may be allowed to take medicines with small sips of water. If you do not follow your health care provider's instructions, your procedure may be delayed or canceled. Medicines Ask your health care provider about: Changing or stopping your regular medicines. These include any diabetes medicines or blood thinners you take. Taking medicines such as aspirin and ibuprofen. These medicines can thin your blood. Do not take them unless your health care provider tells you to. Taking over-the-counter medicines, vitamins, herbs, and supplements. Tests You may have tests, such as: Blood tests. Pee (urine) tests. Imaging tests. This may include a CT scan. Surgery safety Ask your health care provider: How your surgery site will be marked. What steps will be taken to help prevent infection. These steps may include: Washing skin with a soap that kills germs. Receiving antibiotics. General instructions If you will be going home right after the procedure, plan to have a responsible adult: Take you home from the hospital or clinic. You will not be allowed to drive. Care for you for the time you are told. What happens during the procedure?  An IV will be inserted into one of your veins. You may be given: A sedative. This helps you relax. Anesthesia. This will: Numb certain areas of your body. Make you fall asleep for surgery. A water-filled cushion may be placed behind your kidney or on your abdomen. In some cases, you may be placed in a tub of lukewarm water. Your body will be positioned in a way that makes it  easier to target the kidney stone. An X-ray or ultrasound exam will be done to locate your stone. Shock waves will be aimed at the stone. If you are awake, you may feel a tapping sensation as the shock waves pass through your body. A small mesh tube (stent) may be placed in  your ureter. This will help keep pee flowing from the kidney if the fragments of the stone have been blocking the ureter. The stent will be removed at a later time by your health care provider. The procedure may vary among health care providers and hospitals. What happens after the procedure? Your blood pressure, heart rate, breathing rate, and blood oxygen level will be monitored until you leave the hospital or clinic. You may have an X-ray after the procedure to see how many of the kidney stones were broken up. This will also show how much of the stone has passed. If there are still large fragments after treatment, you may need to have a second procedure at a later time. This information is not intended to replace advice given to you by your health care provider. Make sure you discuss any questions you have with your health care provider. Document Revised: 12/12/2022 Document Reviewed: 10/02/2021 Elsevier Patient Education  2024 [employer].

## 2024-01-03 ENCOUNTER — Encounter (HOSPITAL_COMMUNITY): Payer: Self-pay

## 2024-01-03 ENCOUNTER — Encounter (HOSPITAL_COMMUNITY)
Admission: RE | Admit: 2024-01-03 | Discharge: 2024-01-03 | Disposition: A | Discharge status: Home / Self Care | Source: Ambulatory Visit | Attending: Urology | Admitting: Urology

## 2024-01-03 HISTORY — DX: Interstitial cystitis (chronic) without hematuria: N30.10

## 2024-01-04 ENCOUNTER — Ambulatory Visit: Admitting: Urology

## 2024-01-04 ENCOUNTER — Ambulatory Visit (HOSPITAL_COMMUNITY)
Admission: RE | Admit: 2024-01-04 | Discharge: 2024-01-04 | Disposition: A | Discharge status: Home / Self Care | Source: Ambulatory Visit | Attending: Urology | Admitting: Urology

## 2024-01-04 ENCOUNTER — Encounter (HOSPITAL_COMMUNITY)
Admission: RE | Disposition: A | Discharge status: Home / Self Care | Payer: Self-pay | Source: Ambulatory Visit | Attending: Urology

## 2024-01-04 ENCOUNTER — Ambulatory Visit (HOSPITAL_COMMUNITY)

## 2024-01-04 ENCOUNTER — Encounter (HOSPITAL_COMMUNITY): Payer: Self-pay | Admitting: Urology

## 2024-01-04 DIAGNOSIS — N200 Calculus of kidney: Secondary | ICD-10-CM | POA: Diagnosis not present

## 2024-01-04 DIAGNOSIS — N201 Calculus of ureter: Secondary | ICD-10-CM | POA: Diagnosis present

## 2024-01-04 DIAGNOSIS — I10 Essential (primary) hypertension: Secondary | ICD-10-CM | POA: Diagnosis not present

## 2024-01-04 DIAGNOSIS — R109 Unspecified abdominal pain: Secondary | ICD-10-CM | POA: Diagnosis not present

## 2024-01-04 HISTORY — PX: EXTRACORPOREAL SHOCK WAVE LITHOTRIPSY: SHX1557

## 2024-01-04 LAB — POCT PREGNANCY, URINE: Preg Test, Ur: NEGATIVE

## 2024-01-04 SURGERY — LITHOTRIPSY, ESWL
Anesthesia: LOCAL | Laterality: Left

## 2024-01-04 MED ORDER — DIAZEPAM 5 MG PO TABS
10.0000 mg | ORAL_TABLET | Freq: Once | ORAL | Status: AC
  Administered 2024-01-04: 10 mg via ORAL
  Filled 2024-01-04: qty 2

## 2024-01-04 MED ORDER — TAMSULOSIN HCL 0.4 MG PO CAPS: 0.4000 mg | ORAL_CAPSULE | Freq: Every day | ORAL | 0 refills | Status: DC

## 2024-01-04 MED ORDER — SODIUM CHLORIDE 0.9 % IV SOLN: INTRAVENOUS | Status: DC

## 2024-01-04 MED ORDER — DIPHENHYDRAMINE HCL 25 MG PO CAPS
25.0000 mg | ORAL_CAPSULE | ORAL | Status: AC
  Administered 2024-01-04: 25 mg via ORAL
  Filled 2024-01-04: qty 1

## 2024-01-04 MED ORDER — OXYCODONE-ACETAMINOPHEN 5-325 MG PO TABS: 1.0000 | ORAL_TABLET | ORAL | 0 refills | Status: DC | PRN

## 2024-01-04 MED ORDER — ONDANSETRON HCL 4 MG PO TABS: 4.0000 mg | ORAL_TABLET | Freq: Three times a day (TID) | ORAL | 0 refills | Status: DC | PRN

## 2024-01-04 NOTE — Interval H&P Note (Signed)
 History and Physical Interval Note:  01/04/2024 10:38 AM  [name]  has presented today for surgery, with the diagnosis of Left Ureteral Stone.  The various methods of treatment have been discussed with the patient and family. After consideration of risks, benefits and other options for treatment, the patient has consented to  Procedure(s): LITHOTRIPSY, ESWL (Left) as a surgical intervention.  The patient's history has been reviewed, patient examined, no change in status, stable for surgery.  I have reviewed the patient's chart and labs.  Questions were answered to the patient's satisfaction.     Bethany King

## 2024-01-04 NOTE — Progress Notes (Signed)
 Assessed anterior area post treatment, small amount of petechia noted.  Pt states no discomfort at this time.

## 2024-01-05 ENCOUNTER — Encounter (HOSPITAL_COMMUNITY): Payer: Self-pay | Admitting: Urology

## 2024-01-06 ENCOUNTER — Encounter (HOSPITAL_COMMUNITY): Payer: Self-pay | Admitting: Urology

## 2024-01-20 ENCOUNTER — Telehealth: Payer: Self-pay | Admitting: Urology

## 2024-01-20 NOTE — Telephone Encounter (Signed)
 Had litho and feels like she has a stone blocking and is worse than before, she cannot wait until next appointment

## 2024-01-27 ENCOUNTER — Other Ambulatory Visit: Payer: Self-pay

## 2024-01-27 ENCOUNTER — Ambulatory Visit (HOSPITAL_COMMUNITY)
Admission: RE | Admit: 2024-01-27 | Discharge: 2024-01-27 | Disposition: A | Discharge status: Home / Self Care | Source: Ambulatory Visit | Attending: Urology | Admitting: Urology

## 2024-01-27 DIAGNOSIS — N200 Calculus of kidney: Secondary | ICD-10-CM | POA: Insufficient documentation

## 2024-01-27 DIAGNOSIS — R109 Unspecified abdominal pain: Secondary | ICD-10-CM | POA: Diagnosis not present

## 2024-02-01 ENCOUNTER — Encounter: Admitting: Urology

## 2024-02-01 ENCOUNTER — Telehealth: Payer: Self-pay

## 2024-02-01 NOTE — Telephone Encounter (Signed)
 Pain in left kidney litho done 4 weeks ago pt states pain went away for awhile but pain has started back up about a week ago pt states she has not passed any stones since procedure pt also states she has IC and since being on flomax  it seems to help with IC symptoms and helps for kidney pain would like a refill

## 2024-02-02 DIAGNOSIS — Z6828 Body mass index (BMI) 28.0-28.9, adult: Secondary | ICD-10-CM | POA: Diagnosis not present

## 2024-02-02 DIAGNOSIS — N390 Urinary tract infection, site not specified: Secondary | ICD-10-CM | POA: Diagnosis not present

## 2024-02-02 DIAGNOSIS — M5010 Cervical disc disorder with radiculopathy, unspecified cervical region: Secondary | ICD-10-CM | POA: Diagnosis not present

## 2024-02-02 DIAGNOSIS — L4050 Arthropathic psoriasis, unspecified: Secondary | ICD-10-CM | POA: Diagnosis not present

## 2024-02-02 DIAGNOSIS — K219 Gastro-esophageal reflux disease without esophagitis: Secondary | ICD-10-CM | POA: Diagnosis not present

## 2024-02-02 DIAGNOSIS — G4700 Insomnia, unspecified: Secondary | ICD-10-CM | POA: Diagnosis not present

## 2024-02-02 DIAGNOSIS — E6609 Other obesity due to excess calories: Secondary | ICD-10-CM | POA: Diagnosis not present

## 2024-02-02 DIAGNOSIS — I10 Essential (primary) hypertension: Secondary | ICD-10-CM | POA: Diagnosis not present

## 2024-02-02 DIAGNOSIS — M5126 Other intervertebral disc displacement, lumbar region: Secondary | ICD-10-CM | POA: Diagnosis not present

## 2024-02-07 ENCOUNTER — Other Ambulatory Visit: Payer: Self-pay

## 2024-02-08 MED ORDER — TAMSULOSIN HCL 0.4 MG PO CAPS: 0.4000 mg | ORAL_CAPSULE | Freq: Every day | ORAL | 0 refills | Status: DC

## 2024-02-11 ENCOUNTER — Ambulatory Visit: Payer: Self-pay

## 2024-02-12 ENCOUNTER — Ambulatory Visit: Payer: Self-pay

## 2024-02-16 ENCOUNTER — Encounter: Payer: Self-pay | Admitting: Urology

## 2024-02-16 ENCOUNTER — Ambulatory Visit (INDEPENDENT_AMBULATORY_CARE_PROVIDER_SITE_OTHER): Admitting: Urology

## 2024-02-16 ENCOUNTER — Ambulatory Visit (HOSPITAL_COMMUNITY)
Admission: RE | Admit: 2024-02-16 | Discharge: 2024-02-16 | Disposition: A | Discharge status: Home / Self Care | Source: Ambulatory Visit | Attending: Urology | Admitting: Urology

## 2024-02-16 VITALS — BP 129/71 | HR 108

## 2024-02-16 DIAGNOSIS — N3010 Interstitial cystitis (chronic) without hematuria: Secondary | ICD-10-CM | POA: Diagnosis not present

## 2024-02-16 DIAGNOSIS — N200 Calculus of kidney: Secondary | ICD-10-CM | POA: Diagnosis not present

## 2024-02-16 LAB — URINALYSIS, ROUTINE W REFLEX MICROSCOPIC
Bilirubin, UA: NEGATIVE
Glucose, UA: NEGATIVE
Ketones, UA: NEGATIVE
Nitrite, UA: NEGATIVE
Specific Gravity, UA: 1.02 (ref 1.005–1.030)
Urobilinogen, Ur: 0.2 mg/dL (ref 0.2–1.0)
pH, UA: 6 (ref 5.0–7.5)

## 2024-02-16 LAB — MICROSCOPIC EXAMINATION

## 2024-02-16 MED ORDER — TAMSULOSIN HCL 0.4 MG PO CAPS: 0.4000 mg | ORAL_CAPSULE | Freq: Every day | ORAL | 11 refills | Status: DC

## 2024-02-16 NOTE — Patient Instructions (Signed)

## 2024-02-16 NOTE — Progress Notes (Signed)
 02/16/2024 10:32 AM   Bethany King December 13, 1970 [id number]  Referring provider: Bertell Satterfield, MD [address] [place],  [place] [address]  Followup nephrolithiasis   HPI: [name] is a 53yo here for followup after ESWL. She did not pass any large fragments. KUB from today shows a left lower pole calculus. She is having intermittent ledft flank pain   PMH: Past Medical History:  Diagnosis Date   Allergic rhinitis    Anxiety    Arthritis    ASCUS of cervix with negative high risk HPV 04/20/2022   04/20/22 repeat in 3 years per ASCCP guidelines, 5 year risk CIN 3+ is 0.27%    Bladder pain    Complication of anesthesia    Costochondritis    hx   DDD (degenerative disc disease), lumbar    Depression    GERD (gastroesophageal reflux disease)    Hepatic hemangioma 2006   stable   History of adenomatous polyp of colon    History of colitis    History of gastritis    History of kidney stones    HTN (hypertension)    Interstitial cystitis    Irritable bowel syndrome (IBS)    Medullary sponge kidney    left   Migraine    Nephrolithiasis    bilateral-- nonobstructive per CT   PONV (postoperative nausea and vomiting)    Psoriatic arthritis (HCC)    Psoriatic arthritis (HCC)    Spinal stenosis    Urgency of urination     Surgical History: Past Surgical History:  Procedure Laterality Date   ANTERIOR CERVICAL DECOMP/DISCECTOMY FUSION  02/26/2014   C5 - C6; fusion with plate   BIOPSY  01/04/2019   Procedure: BIOPSY;  Surgeon: Golda Claudis PENNER, MD;  [facility];  Service: Endoscopy;;  gastric polyps   BIOPSY  02/10/2022   Procedure: BIOPSY;  Surgeon: Eartha Angelia Sieving, MD;  [facility];  Service: Gastroenterology;;   ORIN MEDIATE RELEASE Bilateral right 04-01-2010/  left 05-30-2010   CHOLECYSTECTOMY N/A 07/14/2013   Procedure: LAPAROSCOPIC CHOLECYSTECTOMY;  Surgeon: Oneil DELENA Budge, MD;  Location: [facility];  Service: General;   Laterality: N/A;   COLONOSCOPY  10-13-2010   COLONOSCOPY N/A 01/04/2019   Procedure: COLONOSCOPY;  Surgeon: Golda Claudis PENNER, MD;  [facility];  Service: Endoscopy;  Laterality: N/A;   COLONOSCOPY WITH PROPOFOL  N/A 02/10/2022   Procedure: COLONOSCOPY WITH PROPOFOL ;  Surgeon: Eartha Angelia Sieving, MD;  [facility];  Service: Gastroenterology;  Laterality: N/A;  915 ASA 3   CYSTO WITH HYDRODISTENSION N/A 04/02/2015   Procedure: CYSTOSCOPY/HYDRODISTENSION, BLADDER BIOPSY WITH FULGURATION;  Surgeon: Norleen Seltzer, MD;  Location: [facility];  Service: Urology;  Laterality: N/A;   CYSTO WITH HYDRODISTENSION N/A 03/25/2018   Procedure: CYSTOSCOPY/HYDRODISTENSION, INSTILL MARCAIN AND PYRIDIUM ;  Surgeon: Seltzer Norleen, MD;  Location: [facility];  Service: Urology;  Laterality: N/A;   CYSTO WITH HYDRODISTENSION N/A 01/15/2020   Procedure: CYSTOSCOPY/HYDRODISTENSION;  Surgeon: Sherrilee Belvie CROME, MD;  Location: [facility];  Service: Urology;  Laterality: N/A;   CYSTOSCOPY W/ URETERAL STENT PLACEMENT Bilateral 04/08/2016   Procedure: CYSTOSCOPY WITH BILATERAL RETROGRADE PYELOGRAM, BILATERAL URETERAL STENT PLACEMENT;  Surgeon: Belvie CROME Sherrilee, MD;  Location: [facility];  Service: Urology;  Laterality: Bilateral;   CYSTOSCOPY W/ URETERAL STENT PLACEMENT Left 04/15/2016   Procedure: CYSTOSCOPY WITH RETROGRADE PYELOGRAM/URETERAL STENT PLACEMENT;  Surgeon: Belvie CROME Sherrilee, MD;  Location: [facility];  Service: Urology;  Laterality: Left;  CYSTOSCOPY WITH BIOPSY N/A 01/15/2020   Procedure: CYSTOSCOPY WITH BLADDER BIOPSY AND FULGERATION;  Surgeon: Sherrilee Belvie CROME, MD;  [facility]: [facility];  Service: Urology;  Laterality: N/A;   CYSTOSCOPY WITH HOLMIUM LASER LITHOTRIPSY Right 04/08/2016   Procedure: CYSTOSCOPY WITH RIGHT RENAL STONE EXTRACTION WITH HOLMIUM LASER LITHOTRIPSY;  Surgeon: Belvie CROME Sherrilee, MD;  Location: [facility];  Service: Urology;  Laterality: Right;   CYSTOSCOPY WITH HOLMIUM  LASER LITHOTRIPSY Left 04/15/2016   Procedure: CYSTOSCOPY WITH HOLMIUM LASER LITHOTRIPSY;  Surgeon: Belvie CROME Sherrilee, MD;  [facility]: [facility];  Service: Urology;  Laterality: Left;   CYSTOSCOPY WITH RETROGRADE PYELOGRAM, URETEROSCOPY AND STENT PLACEMENT Left 07/05/2017   Procedure: CYSTOSCOPY WITH RETROGRADE PYELOGRAM, URETEROSCOPY AND STENT PLACEMENT;  Surgeon: Sherrilee Belvie CROME, MD;  [facility]: [facility];  Service: Urology;  Laterality: Left;   DIAGNOSTIC LAPAROSCOPY     DILATION AND CURETTAGE OF UTERUS     DILITATION & CURRETTAGE/HYSTROSCOPY WITH THERMACHOICE ABLATION N/A 09/07/2012   Procedure: DILATATION & CURETTAGE/HYSTEROSCOPY WITH THERMACHOICE ABLATION;  Surgeon: Vonn VEAR Inch, MD;  Location: [facility];  Service: Gynecology;  Laterality: N/A;   ESOPHAGOGASTRODUODENOSCOPY  10/14/10   ESOPHAGOGASTRODUODENOSCOPY N/A 01/04/2019   Procedure: ESOPHAGOGASTRODUODENOSCOPY (EGD);  Surgeon: Golda Claudis PENNER, MD;  [facility];  Service: Endoscopy;  Laterality: N/A;  9:30   ESOPHAGOGASTRODUODENOSCOPY (EGD) WITH PROPOFOL  N/A 02/10/2022   Procedure: ESOPHAGOGASTRODUODENOSCOPY (EGD) WITH PROPOFOL ;  Surgeon: Eartha Angelia Sieving, MD;  [facility];  Service: Gastroenterology;  Laterality: N/A;   EXTRACORPOREAL SHOCK WAVE LITHOTRIPSY  multiple   EXTRACORPOREAL SHOCK WAVE LITHOTRIPSY Left 01/04/2024   Procedure: LITHOTRIPSY, ESWL;  Surgeon: Sherrilee Belvie CROME, MD;  Location: [facility];  Service: Urology;  Laterality: Left;   HOLMIUM LASER APPLICATION Left 07/05/2017   Procedure: HOLMIUM LASER APPLICATION;  Surgeon: Sherrilee Belvie CROME, MD;  Location: [facility];  Service: Urology;  Laterality: Left;   PERCUTANEOUS NEPHROSTOLITHOTOMY  2003   PLANTAR FASCIA SURGERY Bilateral right 2008//  left 2010   POLYPECTOMY  01/04/2019   Procedure: POLYPECTOMY;  Surgeon: Golda Claudis PENNER, MD;  [facility];  Service: Endoscopy;;  colon   STONE EXTRACTION WITH BASKET Left 04/15/2016   Procedure: STONE  EXTRACTION WITH BASKET;  Surgeon: Belvie CROME Sherrilee, MD;  Location: [facility];  Service: Urology;  Laterality: Left;    Home Medications:  Allergies as of 02/16/2024       Reactions   Hydromorphone  Itching   Prednisone Other (See Comments)   Increase anxiety symptoms, insomnia   Tramadol Other (See Comments)   Causes dizziness   Sulfa Antibiotics Rash        Medication List        Accurate as of February 16, 2024 10:32 AM. If you have any questions, ask your nurse or doctor.          ALPRAZolam  0.5 MG tablet Commonly known as: XANAX  Take 0.5 mg by mouth 2 (two) times daily as needed for anxiety.   cephALEXin  500 MG capsule Commonly known as: KEFLEX  Take 1 capsule (500 mg total) by mouth 2 (two) times daily.   CLARITIN  PO Take by mouth.   DULoxetine 60 MG capsule Commonly known as: CYMBALTA Take 60 mg by mouth 2 (two) times daily.   Elmiron  100 MG capsule Generic drug: pentosan polysulfate Take 1 capsule (100 mg total) by mouth 2 (two) times daily.   estradiol  0.1 MG/GM vaginal cream Commonly known as: ESTRACE  Use 0.5 gm in vagina 2-3 x weekly  estradiol  1 MG tablet Commonly known as: ESTRACE  TAKE 1 TABLET(1 MG) BY MOUTH DAILY   fluticasone  50 MCG/ACT nasal spray Commonly known as: FLONASE  Place 1 spray into both nostrils daily as needed for allergies or rhinitis.   HYDROcodone -acetaminophen  10-325 MG tablet Commonly known as: NORCO Take 1 tablet by mouth 4 (four) times daily as needed for moderate pain. What changed:  how much to take when to take this   hydrOXYzine  10 MG tablet Commonly known as: ATARAX  Take 1 tablet (10 mg total) by mouth at bedtime.   ondansetron  4 MG disintegrating tablet Commonly known as: ZOFRAN -ODT Take 1 tablet (4 mg total) by mouth every 8 (eight) hours as needed for nausea or vomiting.   ondansetron  4 MG tablet Commonly known as: Zofran  Take 1 tablet (4 mg total) by mouth every 8 (eight) hours as needed for nausea or  vomiting.   oxyCODONE -acetaminophen  5-325 MG tablet Commonly known as: Percocet Take 1 tablet by mouth every 4 (four) hours as needed.   potassium chloride  10 MEQ tablet Commonly known as: KLOR-CON  Take 40 mEq by mouth daily.   progesterone  200 MG capsule Commonly known as: PROMETRIUM  TAKE 1 CAPSULE(200 MG) BY MOUTH AT BEDTIME   Skyrizi 150 MG/ML Sosy prefilled syringe Generic drug: risankizumab-rzaa Inject into the skin. Every 3 months   tamsulosin  0.4 MG Caps capsule Commonly known as: FLOMAX  Take 1 capsule (0.4 mg total) by mouth daily after supper.   triamterene-hydrochlorothiazide  37.5-25 MG capsule Commonly known as: DYAZIDE Take 1 capsule by mouth daily.   Vitamin D3 125 MCG (5000 UT) Caps Take 5,000 Units by mouth daily.        Allergies:  Allergies  Allergen Reactions   Hydromorphone  Itching   Prednisone Other (See Comments)    Increase anxiety symptoms, insomnia   Tramadol Other (See Comments)    Causes dizziness    Sulfa Antibiotics Rash    Family History: Family History  Problem Relation Age of Onset   Irritable bowel syndrome Mother    Scleroderma Mother    Rheum arthritis Mother    Asthma Mother    Melanoma Father    Cancer Father        Melanoma   Colon cancer Cousin 65       second cousin Harles Lovelace)   Cancer Maternal Grandmother        Ovarian   Diabetes Maternal Grandmother    Thyroid  disease Maternal Aunt     Social History:  reports that she has never smoked. She has never been exposed to tobacco smoke. She has never used smokeless tobacco. She reports that she does not drink alcohol and does not use drugs.  ROS: All other review of systems were reviewed and are negative except what is noted above in HPI  Physical Exam: BP 129/71   Pulse (!) 108   Constitutional:  Alert and oriented, No acute distress. HEENT: [place] AT, moist mucus membranes.  Trachea midline, no masses. Cardiovascular: No clubbing, cyanosis, or  edema. Respiratory: Normal respiratory effort, no increased work of breathing. GI: Abdomen is soft, nontender, nondistended, no abdominal masses GU: No CVA tenderness.  Lymph: No cervical or inguinal lymphadenopathy. Skin: No rashes, bruises or suspicious lesions. Neurologic: Grossly intact, no focal deficits, moving all 4 extremities. Psychiatric: Normal mood and affect.  Laboratory Data: Lab Results  Component Value Date   WBC 7.0 03/21/2018   HGB 10.9 (L) 03/21/2018   HCT 35.3 (L) 03/21/2018   MCV 96.7 03/21/2018  PLT 330 03/21/2018    Lab Results  Component Value Date   CREATININE 0.73 02/05/2022    No results found for: PSA  No results found for: TESTOSTERONE  Lab Results  Component Value Date   HGBA1C 5.7 (H) 09/15/2011    Urinalysis    Component Value Date/Time   COLORURINE AMBER (A) 11/17/2019 1600   APPEARANCEUR Clear 12/31/2023 1049   LABSPEC 1.004 (L) 11/17/2019 1600   PHURINE 6.0 11/17/2019 1600   GLUCOSEU Negative 12/31/2023 1049   HGBUR SMALL (A) 11/17/2019 1600   BILIRUBINUR Negative 12/31/2023 1049   KETONESUR negative 12/26/2023 1231   KETONESUR NEGATIVE 11/17/2019 1600   PROTEINUR Negative 12/31/2023 1049   PROTEINUR NEGATIVE 11/17/2019 1600   UROBILINOGEN 0.2 12/26/2023 1231   UROBILINOGEN 0.2 02/02/2013 1620   NITRITE Negative 12/31/2023 1049   NITRITE POSITIVE (A) 11/17/2019 1600   LEUKOCYTESUR 2+ (A) 12/31/2023 1049   LEUKOCYTESUR LARGE (A) 11/17/2019 1600    Lab Results  Component Value Date   LABMICR See below: 12/31/2023   WBCUA 11-30 (A) 12/31/2023   LABEPIT >10 (A) 12/31/2023   MUCUS Present 10/01/2021   BACTERIA Few (A) 12/31/2023    Pertinent Imaging: KUb today: Images reviewed and discussed with the patient  Results for orders placed during the hospital encounter of 01/27/24  DG Abd 1 View  Narrative CLINICAL DATA:  Kidney stone.  Left flank pain.  EXAM: ABDOMEN - 1 VIEW  COMPARISON:  Most recent radiograph  01/04/2024  FINDINGS: There are multiple bilateral intrarenal calculi. Stable stone distribution from prior exam. Largest stone is in the lower left kidney measuring 13 mm. No evidence of ureteral or bladder stone. Normal bowel gas pattern with moderate colonic stool burden, unchanged from prior. Right upper quadrant surgical clips again seen.  IMPRESSION: Multiple bilateral intrarenal calculi, unchanged from prior exam. No evidence of ureteral stone.   Electronically Signed By: Andrea Gasman M.D. On: 02/10/2024 17:36  No results found for this or any previous visit.  No results found for this or any previous visit.  No results found for this or any previous visit.  Results for orders placed during the hospital encounter of 09/01/17  US  RENAL  Narrative CLINICAL DATA:  Kidney stones  EXAM: RENAL / URINARY TRACT ULTRASOUND COMPLETE  COMPARISON:  Abdominal ultrasound of August 03, 2017  FINDINGS: Right Kidney:  Length: 10.7 cm. There is cortical thinning diffusely. The cortical echotexture remains lower than that of the adjacent liver. There is a nonobstructing mid/upper pole stone measuring approximately 7 mm in diameter. There is no hydronephrosis.  Left Kidney:  Length: 11 cm. There is diffuse cortical thinning similar to that on the right. There is a mid to upper pole stone measuring 6 mm in diameter. There is no hydronephrosis.  Bladder:  Appears normal for degree of bladder distention. Bilateral ureteral jets are observed.  IMPRESSION: Bilateral nonobstructing kidney stones. Diffuse renal cortical thinning. No hydronephrosis. Normal appearing urinary bladder.   Electronically Signed By: David  [place] M.D. On: 09/01/2017 10:23  No results found for this or any previous visit.  No results found for this or any previous visit.  No results found for this or any previous visit.   Assessment & Plan:    1. Renal calculus (Primary) -We  discussed the management of kidney stones. These options include observation, ureteroscopy, shockwave lithotripsy (ESWL) and percutaneous nephrolithotomy (PCNL). We discussed which options are relevant to the patient's stone(s). We discussed the natural history of kidney stones  as well as the complications of untreated stones and the impact on quality of life without treatment as well as with each of the above listed treatments. We also discussed the efficacy of each treatment in its ability to clear the stone burden. With any of these management options I discussed the signs and symptoms of infection and the need for emergent treatment should these be experienced. For each option we discussed the ability of each procedure to clear the patient of their stone burden.   For observation I described the risks which include but are not limited to silent renal damage, life-threatening infection, need for emergent surgery, failure to pass stone and pain.   For ureteroscopy I described the risks which include bleeding, infection, damage to contiguous structures, positioning injury, ureteral stricture, ureteral avulsion, ureteral injury, need for prolonged ureteral stent, inability to perform ureteroscopy, need for an interval procedure, inability to clear stone burden, stent discomfort/pain, heart attack, stroke, pulmonary embolus and the inherent risks with general anesthesia.   For shockwave lithotripsy I described the risks which include arrhythmia, kidney contusion, kidney hemorrhage, need for transfusion, pain, inability to adequately break up stone, inability to pass stone fragments, Steinstrasse, infection associated with obstructing stones, need for alternate surgical procedure, need for repeat shockwave lithotripsy, MI, CVA, PE and the inherent risks with anesthesia/conscious sedation.   For PCNL I described the risks including positioning injury, pneumothorax, hydrothorax, need for chest tube, inability to  clear stone burden, renal laceration, arterial venous fistula or malformation, need for embolization of kidney, loss of kidney or renal function, need for repeat procedure, need for prolonged nephrostomy tube, ureteral avulsion, MI, CVA, PE and the inherent risks of general anesthesia.   - The patient would like to proceed with left ureteroscopic stone extraction - Urinalysis, Routine w reflex microscopic   No follow-ups on file.  [name], MD  [facility] [place]

## 2024-02-16 NOTE — H&P (View-Only) (Signed)
 02/16/2024 10:32 AM   Bethany King December 13, 1970 [id number]  Referring provider: Bertell Satterfield, MD [address] [name],  [place] [address]  Followup nephrolithiasis   HPI: Bethany King is a 53yo here for followup after ESWL. She did not pass any large fragments. KUB from today shows a left lower pole calculus. She is having intermittent ledft flank pain   PMH: Past Medical History:  Diagnosis Date   Allergic rhinitis    Anxiety    Arthritis    ASCUS of cervix with negative high risk HPV 04/20/2022   04/20/22 repeat in 3 years per ASCCP guidelines, 5 year risk CIN 3+ is 0.27%    Bladder pain    Complication of anesthesia    Costochondritis    hx   DDD (degenerative disc disease), lumbar    Depression    GERD (gastroesophageal reflux disease)    Hepatic hemangioma 2006   stable   History of adenomatous polyp of colon    History of colitis    History of gastritis    History of kidney stones    HTN (hypertension)    Interstitial cystitis    Irritable bowel syndrome (IBS)    Medullary sponge kidney    left   Migraine    Nephrolithiasis    bilateral-- nonobstructive per CT   PONV (postoperative nausea and vomiting)    Psoriatic arthritis (HCC)    Psoriatic arthritis (HCC)    Spinal stenosis    Urgency of urination     Surgical History: Past Surgical History:  Procedure Laterality Date   ANTERIOR CERVICAL DECOMP/DISCECTOMY FUSION  02/26/2014   C5 - C6; fusion with plate   BIOPSY  01/04/2019   Procedure: BIOPSY;  Surgeon: Golda Claudis PENNER, MD;  [facility];  Service: Endoscopy;;  gastric polyps   BIOPSY  02/10/2022   Procedure: BIOPSY;  Surgeon: Eartha Angelia Sieving, MD;  [facility];  Service: Gastroenterology;;   ORIN MEDIATE RELEASE Bilateral right 04-01-2010/  left 05-30-2010   CHOLECYSTECTOMY N/A 07/14/2013   Procedure: LAPAROSCOPIC CHOLECYSTECTOMY;  Surgeon: Oneil DELENA Budge, MD;  Location: [facility];  Service: General;   Laterality: N/A;   COLONOSCOPY  10-13-2010   COLONOSCOPY N/A 01/04/2019   Procedure: COLONOSCOPY;  Surgeon: Golda Claudis PENNER, MD;  [facility];  Service: Endoscopy;  Laterality: N/A;   COLONOSCOPY WITH PROPOFOL  N/A 02/10/2022   Procedure: COLONOSCOPY WITH PROPOFOL ;  Surgeon: Eartha Angelia Sieving, MD;  [facility];  Service: Gastroenterology;  Laterality: N/A;  [address]SA 3   CYSTO WITH HYDRODISTENSION N/A 04/02/2015   Procedure: CYSTOSCOPY/HYDRODISTENSION, BLADDER BIOPSY WITH FULGURATION;  Surgeon: Norleen Seltzer, MD;  Location: [facility];  Service: Urology;  Laterality: N/A;   CYSTO WITH HYDRODISTENSION N/A 03/25/2018   Procedure: CYSTOSCOPY/HYDRODISTENSION, INSTILL MARCAIN AND PYRIDIUM ;  Surgeon: Seltzer Norleen, MD;  Location: [facility];  Service: Urology;  Laterality: N/A;   CYSTO WITH HYDRODISTENSION N/A 01/15/2020   Procedure: CYSTOSCOPY/HYDRODISTENSION;  Surgeon: Sherrilee Belvie CROME, MD;  Location: [facility];  Service: Urology;  Laterality: N/A;   CYSTOSCOPY W/ URETERAL STENT PLACEMENT Bilateral 04/08/2016   Procedure: CYSTOSCOPY WITH BILATERAL RETROGRADE PYELOGRAM, BILATERAL URETERAL STENT PLACEMENT;  Surgeon: Belvie CROME Sherrilee, MD;  Location: [facility];  Service: Urology;  Laterality: Bilateral;   CYSTOSCOPY W/ URETERAL STENT PLACEMENT Left 04/15/2016   Procedure: CYSTOSCOPY WITH RETROGRADE PYELOGRAM/URETERAL STENT PLACEMENT;  Surgeon: Belvie CROME Sherrilee, MD;  Location: [facility];  Service: Urology;  Laterality: Left;  CYSTOSCOPY WITH BIOPSY N/A 01/15/2020   Procedure: CYSTOSCOPY WITH BLADDER BIOPSY AND FULGERATION;  Surgeon: Sherrilee Belvie CROME, MD;  [facility]: [facility];  Service: Urology;  Laterality: N/A;   CYSTOSCOPY WITH HOLMIUM LASER LITHOTRIPSY Right 04/08/2016   Procedure: CYSTOSCOPY WITH RIGHT RENAL STONE EXTRACTION WITH HOLMIUM LASER LITHOTRIPSY;  Surgeon: Belvie CROME Sherrilee, MD;  [facility]: [facility];  Service: Urology;  Laterality: Right;   CYSTOSCOPY WITH HOLMIUM  LASER LITHOTRIPSY Left 04/15/2016   Procedure: CYSTOSCOPY WITH HOLMIUM LASER LITHOTRIPSY;  Surgeon: Belvie CROME Sherrilee, MD;  Location: [facility];  Service: Urology;  Laterality: Left;   CYSTOSCOPY WITH RETROGRADE PYELOGRAM, URETEROSCOPY AND STENT PLACEMENT Left 07/05/2017   Procedure: CYSTOSCOPY WITH RETROGRADE PYELOGRAM, URETEROSCOPY AND STENT PLACEMENT;  Surgeon: Sherrilee Belvie CROME, MD;  Location: [facility];  Service: Urology;  Laterality: Left;   DIAGNOSTIC LAPAROSCOPY     DILATION AND CURETTAGE OF UTERUS     DILITATION & CURRETTAGE/HYSTROSCOPY WITH THERMACHOICE ABLATION N/A 09/07/2012   Procedure: DILATATION & CURETTAGE/HYSTEROSCOPY WITH THERMACHOICE ABLATION;  Surgeon: Vonn VEAR Inch, MD;  Location: [facility];  Service: Gynecology;  Laterality: N/A;   ESOPHAGOGASTRODUODENOSCOPY  10/14/10   ESOPHAGOGASTRODUODENOSCOPY N/A 01/04/2019   Procedure: ESOPHAGOGASTRODUODENOSCOPY (EGD);  Surgeon: Golda Claudis PENNER, MD;  [facility];  Service: Endoscopy;  Laterality: N/A;  9:30   ESOPHAGOGASTRODUODENOSCOPY (EGD) WITH PROPOFOL  N/A 02/10/2022   Procedure: ESOPHAGOGASTRODUODENOSCOPY (EGD) WITH PROPOFOL ;  Surgeon: Eartha Angelia Sieving, MD;  [facility];  Service: Gastroenterology;  Laterality: N/A;   EXTRACORPOREAL SHOCK WAVE LITHOTRIPSY  multiple   EXTRACORPOREAL SHOCK WAVE LITHOTRIPSY Left 01/04/2024   Procedure: LITHOTRIPSY, ESWL;  Surgeon: Sherrilee Belvie CROME, MD;  Location: [facility];  Service: Urology;  Laterality: Left;   HOLMIUM LASER APPLICATION Left 07/05/2017   Procedure: HOLMIUM LASER APPLICATION;  Surgeon: Sherrilee Belvie CROME, MD;  Location: [facility];  Service: Urology;  Laterality: Left;   PERCUTANEOUS NEPHROSTOLITHOTOMY  2003   PLANTAR FASCIA SURGERY Bilateral right 2008//  left 2010   POLYPECTOMY  01/04/2019   Procedure: POLYPECTOMY;  Surgeon: Golda Claudis PENNER, MD;  [facility];  Service: Endoscopy;;  colon   STONE EXTRACTION WITH BASKET Left 04/15/2016   Procedure: STONE  EXTRACTION WITH BASKET;  Surgeon: Belvie CROME Sherrilee, MD;  Location: [facility];  Service: Urology;  Laterality: Left;    Home Medications:  Allergies as of 02/16/2024       Reactions   Hydromorphone  Itching   Prednisone Other (See Comments)   Increase anxiety symptoms, insomnia   Tramadol Other (See Comments)   Causes dizziness   Sulfa Antibiotics Rash        Medication List        Accurate as of February 16, 2024 10:32 AM. If you have any questions, ask your nurse or doctor.          ALPRAZolam  0.5 MG tablet Commonly known as: XANAX  Take 0.5 mg by mouth 2 (two) times daily as needed for anxiety.   cephALEXin  500 MG capsule Commonly known as: KEFLEX  Take 1 capsule (500 mg total) by mouth 2 (two) times daily.   CLARITIN  PO Take by mouth.   DULoxetine 60 MG capsule Commonly known as: CYMBALTA Take 60 mg by mouth 2 (two) times daily.   Elmiron  100 MG capsule Generic drug: pentosan polysulfate Take 1 capsule (100 mg total) by mouth 2 (two) times daily.   estradiol  0.1 MG/GM vaginal cream Commonly known as: ESTRACE  Use 0.5 gm in vagina 2-3 x weekly  estradiol  1 MG tablet Commonly known as: ESTRACE  TAKE 1 TABLET(1 MG) BY MOUTH DAILY   fluticasone  50 MCG/ACT nasal spray Commonly known as: FLONASE  Place 1 spray into both nostrils daily as needed for allergies or rhinitis.   HYDROcodone -acetaminophen  10-325 MG tablet Commonly known as: NORCO Take 1 tablet by mouth 4 (four) times daily as needed for moderate pain. What changed:  how much to take when to take this   hydrOXYzine  10 MG tablet Commonly known as: ATARAX  Take 1 tablet (10 mg total) by mouth at bedtime.   ondansetron  4 MG disintegrating tablet Commonly known as: ZOFRAN -ODT Take 1 tablet (4 mg total) by mouth every 8 (eight) hours as needed for nausea or vomiting.   ondansetron  4 MG tablet Commonly known as: Zofran  Take 1 tablet (4 mg total) by mouth every 8 (eight) hours as needed for nausea or  vomiting.   oxyCODONE -acetaminophen  5-325 MG tablet Commonly known as: Percocet Take 1 tablet by mouth every 4 (four) hours as needed.   potassium chloride  10 MEQ tablet Commonly known as: KLOR-CON  Take 40 mEq by mouth daily.   progesterone  200 MG capsule Commonly known as: PROMETRIUM  TAKE 1 CAPSULE(200 MG) BY MOUTH AT BEDTIME   Skyrizi 150 MG/ML Sosy prefilled syringe Generic drug: risankizumab-rzaa Inject into the skin. Every 3 months   tamsulosin  0.4 MG Caps capsule Commonly known as: FLOMAX  Take 1 capsule (0.4 mg total) by mouth daily after supper.   triamterene-hydrochlorothiazide  37.5-25 MG capsule Commonly known as: DYAZIDE Take 1 capsule by mouth daily.   Vitamin D3 125 MCG (5000 UT) Caps Take 5,000 Units by mouth daily.        Allergies:  Allergies  Allergen Reactions   Hydromorphone  Itching   Prednisone Other (See Comments)    Increase anxiety symptoms, insomnia   Tramadol Other (See Comments)    Causes dizziness    Sulfa Antibiotics Rash    Family History: Family History  Problem Relation Age of Onset   Irritable bowel syndrome Mother    Scleroderma Mother    Rheum arthritis Mother    Asthma Mother    Melanoma Father    Cancer Father        Melanoma   Colon cancer Cousin 65       second cousin Harles Lovelace)   Cancer Maternal Grandmother        Ovarian   Diabetes Maternal Grandmother    Thyroid  disease Maternal Aunt     Social History:  reports that she has never smoked. She has never been exposed to tobacco smoke. She has never used smokeless tobacco. She reports that she does not drink alcohol and does not use drugs.  ROS: All other review of systems were reviewed and are negative except what is noted above in HPI  Physical Exam: BP 129/71   Pulse (!) 108   Constitutional:  Alert and oriented, No acute distress. HEENT: [place] AT, moist mucus membranes.  Trachea midline, no masses. Cardiovascular: No clubbing, cyanosis, or  edema. Respiratory: Normal respiratory effort, no increased work of breathing. GI: Abdomen is soft, nontender, nondistended, no abdominal masses GU: No CVA tenderness.  Lymph: No cervical or inguinal lymphadenopathy. Skin: No rashes, bruises or suspicious lesions. Neurologic: Grossly intact, no focal deficits, moving all 4 extremities. Psychiatric: Normal mood and affect.  Laboratory Data: Lab Results  Component Value Date   WBC 7.0 03/21/2018   HGB 10.9 (L) 03/21/2018   HCT 35.3 (L) 03/21/2018   MCV 96.7 03/21/2018  PLT 330 03/21/2018    Lab Results  Component Value Date   CREATININE 0.73 02/05/2022    No results found for: PSA  No results found for: TESTOSTERONE  Lab Results  Component Value Date   HGBA1C 5.7 (H) 09/15/2011    Urinalysis    Component Value Date/Time   COLORURINE AMBER (A) 11/17/2019 1600   APPEARANCEUR Clear 12/31/2023 1049   LABSPEC 1.004 (L) 11/17/2019 1600   PHURINE 6.0 11/17/2019 1600   GLUCOSEU Negative 12/31/2023 1049   HGBUR SMALL (A) 11/17/2019 1600   BILIRUBINUR Negative 12/31/2023 1049   KETONESUR negative 12/26/2023 1231   KETONESUR NEGATIVE 11/17/2019 1600   PROTEINUR Negative 12/31/2023 1049   PROTEINUR NEGATIVE 11/17/2019 1600   UROBILINOGEN 0.2 12/26/2023 1231   UROBILINOGEN 0.2 02/02/2013 1620   NITRITE Negative 12/31/2023 1049   NITRITE POSITIVE (A) 11/17/2019 1600   LEUKOCYTESUR 2+ (A) 12/31/2023 1049   LEUKOCYTESUR LARGE (A) 11/17/2019 1600    Lab Results  Component Value Date   LABMICR See below: 12/31/2023   WBCUA 11-30 (A) 12/31/2023   LABEPIT >10 (A) 12/31/2023   MUCUS Present 10/01/2021   BACTERIA Few (A) 12/31/2023    Pertinent Imaging: KUb today: Images reviewed and discussed with the patient  Results for orders placed during the hospital encounter of 01/27/24  DG Abd 1 View  Narrative CLINICAL DATA:  Kidney stone.  Left flank pain.  EXAM: ABDOMEN - 1 VIEW  COMPARISON:  Most recent radiograph  01/04/2024  FINDINGS: There are multiple bilateral intrarenal calculi. Stable stone distribution from prior exam. Largest stone is in the lower left kidney measuring 13 mm. No evidence of ureteral or bladder stone. Normal bowel gas pattern with moderate colonic stool burden, unchanged from prior. Right upper quadrant surgical clips again seen.  IMPRESSION: Multiple bilateral intrarenal calculi, unchanged from prior exam. No evidence of ureteral stone.   Electronically Signed By: Andrea Gasman M.D. On: 02/10/2024 17:36  No results found for this or any previous visit.  No results found for this or any previous visit.  No results found for this or any previous visit.  Results for orders placed during the hospital encounter of 09/01/17  US  RENAL  Narrative CLINICAL DATA:  Kidney stones  EXAM: RENAL / URINARY TRACT ULTRASOUND COMPLETE  COMPARISON:  Abdominal ultrasound of August 03, 2017  FINDINGS: Right Kidney:  Length: 10.7 cm. There is cortical thinning diffusely. The cortical echotexture remains lower than that of the adjacent liver. There is a nonobstructing mid/upper pole stone measuring approximately 7 mm in diameter. There is no hydronephrosis.  Left Kidney:  Length: 11 cm. There is diffuse cortical thinning similar to that on the right. There is a mid to upper pole stone measuring 6 mm in diameter. There is no hydronephrosis.  Bladder:  Appears normal for degree of bladder distention. Bilateral ureteral jets are observed.  IMPRESSION: Bilateral nonobstructing kidney stones. Diffuse renal cortical thinning. No hydronephrosis. Normal appearing urinary bladder.   Electronically Signed By: David  [place] M.D. On: 09/01/2017 10:23  No results found for this or any previous visit.  No results found for this or any previous visit.  No results found for this or any previous visit.   Assessment & Plan:    1. Renal calculus (Primary) -We  discussed the management of kidney stones. These options include observation, ureteroscopy, shockwave lithotripsy (ESWL) and percutaneous nephrolithotomy (PCNL). We discussed which options are relevant to the patient's stone(s). We discussed the natural history of kidney stones  as well as the complications of untreated stones and the impact on quality of life without treatment as well as with each of the above listed treatments. We also discussed the efficacy of each treatment in its ability to clear the stone burden. With any of these management options I discussed the signs and symptoms of infection and the need for emergent treatment should these be experienced. For each option we discussed the ability of each procedure to clear the patient of their stone burden.   For observation I described the risks which include but are not limited to silent renal damage, life-threatening infection, need for emergent surgery, failure to pass stone and pain.   For ureteroscopy I described the risks which include bleeding, infection, damage to contiguous structures, positioning injury, ureteral stricture, ureteral avulsion, ureteral injury, need for prolonged ureteral stent, inability to perform ureteroscopy, need for an interval procedure, inability to clear stone burden, stent discomfort/pain, heart attack, stroke, pulmonary embolus and the inherent risks with general anesthesia.   For shockwave lithotripsy I described the risks which include arrhythmia, kidney contusion, kidney hemorrhage, need for transfusion, pain, inability to adequately break up stone, inability to pass stone fragments, Steinstrasse, infection associated with obstructing stones, need for alternate surgical procedure, need for repeat shockwave lithotripsy, MI, CVA, PE and the inherent risks with anesthesia/conscious sedation.   For PCNL I described the risks including positioning injury, pneumothorax, hydrothorax, need for chest tube, inability to  clear stone burden, renal laceration, arterial venous fistula or malformation, need for embolization of kidney, loss of kidney or renal function, need for repeat procedure, need for prolonged nephrostomy tube, ureteral avulsion, MI, CVA, PE and the inherent risks of general anesthesia.   - The patient would like to proceed with left ureteroscopic stone extraction - Urinalysis, Routine w reflex microscopic   No follow-ups on file.  [name], MD  [facility] [place]

## 2024-02-18 LAB — URINE CULTURE

## 2024-02-21 ENCOUNTER — Other Ambulatory Visit: Payer: Self-pay

## 2024-02-21 DIAGNOSIS — N200 Calculus of kidney: Secondary | ICD-10-CM

## 2024-02-22 DIAGNOSIS — L4050 Arthropathic psoriasis, unspecified: Secondary | ICD-10-CM | POA: Diagnosis not present

## 2024-02-22 DIAGNOSIS — M1990 Unspecified osteoarthritis, unspecified site: Secondary | ICD-10-CM | POA: Diagnosis not present

## 2024-02-22 DIAGNOSIS — L409 Psoriasis, unspecified: Secondary | ICD-10-CM | POA: Diagnosis not present

## 2024-02-22 DIAGNOSIS — M2550 Pain in unspecified joint: Secondary | ICD-10-CM | POA: Diagnosis not present

## 2024-02-22 DIAGNOSIS — Z79899 Other long term (current) drug therapy: Secondary | ICD-10-CM | POA: Diagnosis not present

## 2024-02-22 DIAGNOSIS — N3010 Interstitial cystitis (chronic) without hematuria: Secondary | ICD-10-CM | POA: Diagnosis not present

## 2024-02-22 DIAGNOSIS — N200 Calculus of kidney: Secondary | ICD-10-CM | POA: Diagnosis not present

## 2024-02-22 DIAGNOSIS — M79643 Pain in unspecified hand: Secondary | ICD-10-CM | POA: Diagnosis not present

## 2024-02-22 DIAGNOSIS — M25579 Pain in unspecified ankle and joints of unspecified foot: Secondary | ICD-10-CM | POA: Diagnosis not present

## 2024-02-23 LAB — URINALYSIS, ROUTINE W REFLEX MICROSCOPIC
Bilirubin, UA: NEGATIVE
Glucose, UA: NEGATIVE
Nitrite, UA: NEGATIVE
Specific Gravity, UA: 1.027 (ref 1.005–1.030)
Urobilinogen, Ur: 0.2 mg/dL (ref 0.2–1.0)
pH, UA: 5.5 (ref 5.0–7.5)

## 2024-02-23 LAB — MICROSCOPIC EXAMINATION
Bacteria, UA: NONE SEEN
Casts: NONE SEEN /LPF

## 2024-02-24 DIAGNOSIS — J302 Other seasonal allergic rhinitis: Secondary | ICD-10-CM | POA: Diagnosis not present

## 2024-02-24 DIAGNOSIS — Z8249 Family history of ischemic heart disease and other diseases of the circulatory system: Secondary | ICD-10-CM | POA: Diagnosis not present

## 2024-02-24 DIAGNOSIS — Z7689 Persons encountering health services in other specified circumstances: Secondary | ICD-10-CM | POA: Diagnosis not present

## 2024-02-24 DIAGNOSIS — I10 Essential (primary) hypertension: Secondary | ICD-10-CM | POA: Diagnosis not present

## 2024-02-24 DIAGNOSIS — F419 Anxiety disorder, unspecified: Secondary | ICD-10-CM | POA: Diagnosis not present

## 2024-02-24 DIAGNOSIS — N3010 Interstitial cystitis (chronic) without hematuria: Secondary | ICD-10-CM | POA: Diagnosis not present

## 2024-02-24 DIAGNOSIS — Z833 Family history of diabetes mellitus: Secondary | ICD-10-CM | POA: Diagnosis not present

## 2024-02-24 DIAGNOSIS — F32A Depression, unspecified: Secondary | ICD-10-CM | POA: Diagnosis not present

## 2024-02-28 ENCOUNTER — Other Ambulatory Visit: Payer: Self-pay | Admitting: Urology

## 2024-02-28 NOTE — Telephone Encounter (Signed)
 Would like pain medication sent in, says her kidney stones are killing her [facility] [address]

## 2024-02-29 MED ORDER — OXYCODONE-ACETAMINOPHEN 5-325 MG PO TABS: 1.0000 | ORAL_TABLET | ORAL | 0 refills | Status: DC | PRN

## 2024-02-29 NOTE — Telephone Encounter (Signed)
 Pt made aware Rx Oxycodone  sent to pharmacy of filed. Verbalized understanding.

## 2024-03-06 ENCOUNTER — Other Ambulatory Visit: Payer: Self-pay

## 2024-03-06 ENCOUNTER — Other Ambulatory Visit

## 2024-03-06 ENCOUNTER — Telehealth: Payer: Self-pay

## 2024-03-06 DIAGNOSIS — N200 Calculus of kidney: Secondary | ICD-10-CM

## 2024-03-06 DIAGNOSIS — Z01812 Encounter for preprocedural laboratory examination: Secondary | ICD-10-CM

## 2024-03-06 DIAGNOSIS — Z01818 Encounter for other preprocedural examination: Secondary | ICD-10-CM

## 2024-03-06 LAB — URINALYSIS, ROUTINE W REFLEX MICROSCOPIC
Bilirubin, UA: NEGATIVE
Glucose, UA: NEGATIVE
Ketones, UA: NEGATIVE
Nitrite, UA: NEGATIVE
Protein,UA: NEGATIVE
Specific Gravity, UA: <1.005 — ABNORMAL LOW (ref 1.005–1.030)
Urobilinogen, Ur: 0.2 mg/dL (ref 0.2–1.0)
pH, UA: 6.5 (ref 5.0–7.5)

## 2024-03-06 LAB — MICROSCOPIC EXAMINATION
Bacteria, UA: NONE SEEN
Epithelial Cells (non renal): >10 /HPF — AB (ref 0–10)

## 2024-03-06 NOTE — Progress Notes (Signed)
 Patient presents today with complaints of  Pre-op.  UA and Culture done today.  Dr. Sherrilee reviewed results and Awaiting urine culture. Patient aware of MD recommendations and that we will reach out with culture results.      Dyjwpvlj, CMA

## 2024-03-06 NOTE — Telephone Encounter (Signed)
 Tried calling pt with no answer to give Pre-op UA results. Left vm for return call.

## 2024-03-06 NOTE — H&P (View-Only) (Signed)
 Patient presents today with complaints of  Pre-op.  UA and Culture done today.  Dr. Sherrilee reviewed results and Awaiting urine culture. Patient aware of MD recommendations and that we will reach out with culture results.      Dyjwpvlj, CMA

## 2024-03-07 NOTE — Telephone Encounter (Signed)
 Pt is made aware urinalysis was good but we did a culture and will reach out with MD, McKenzie recommendation. Verbalized understanding

## 2024-03-07 NOTE — Patient Instructions (Signed)
 Bethany King  03/07/2024     @PREFPERIOPPHARMACY @   Your procedure is scheduled on  03/13/2024.   Report to [facility] at  1030 A.M.   Call this number if you have problems the morning of surgery:  [phone]  If you experience any cold or flu symptoms such as cough, fever, chills, shortness of breath, etc. between now and your scheduled surgery, please notify us  at the above number.   Remember:  Do not eat after midnight.   You may drink clear liquids until 0830 am on 03/13/2024.       Clear liquids allowed are:                    Water , Juice (No red color; non-citric and without pulp; diabetics please choose diet or no sugar options), Carbonated beverages (diabetics please choose diet or no sugar options), Clear Tea (No creamer, milk, or cream, including half & half and powdered creamer), Black Coffee Only (No creamer, milk or cream, including half & half and powdered creamer), and Clear Sports drink (No red color; diabetics please choose diet or no sugar options)    Take these medicines the morning of surgery with A SIP OF WATER          cyclobenzaprine (if needed), duloxetine, oxycodone (if needed), pantoprazole .    Do not wear jewelry, make-up or nail polish, including gel polish,  artificial nails, or any other type of covering on natural nails (fingers and  toes).  Do not wear lotions, powders, or perfumes, or deodorant.  Do not shave 48 hours prior to surgery.  Men may shave face and neck.  Do not bring valuables to the hospital.  [facility] is not responsible for any belongings or valuables.  Contacts, dentures or bridgework may not be worn into surgery.  Leave your suitcase in the car.  After surgery it may be brought to your room.  For patients admitted to the hospital, discharge time will be determined by your treatment team.  Patients discharged the day of surgery will not be allowed to drive home and must have someone with them for 24 hours.     Special instructions:   DO NOT smoke tobacco or vape for 24 hours before your procedure.  Please read over the following fact sheets that you were given. Coughing and Deep Breathing, Surgical Site Infection Prevention, Anesthesia Post-op Instructions, and Care and Recovery After Surgery        General Anesthesia, Adult, Care After The following information offers guidance on how to care for yourself after your procedure. Your health care provider may also give you more specific instructions. If you have problems or questions, contact your health care provider. What can I expect after the procedure? After the procedure, it is common for people to: Have pain or discomfort at the IV site. Have nausea or vomiting. Have a sore throat or hoarseness. Have trouble concentrating. Feel cold or chills. Feel weak, sleepy, or tired (fatigue). Have soreness and body aches. These can affect parts of the body that were not involved in surgery. Follow these instructions at home: For the time period you were told by your health care provider:  Rest. Do not participate in activities where you could fall or become injured. Do not drive or use machinery. Do not drink alcohol. Do not take sleeping pills or medicines that cause drowsiness. Do not make important decisions or sign legal documents. Do not take care of children on  your own. General instructions Drink enough fluid to keep your urine pale yellow. If you have sleep apnea, surgery and certain medicines can increase your risk for breathing problems. Follow instructions from your health care provider about wearing your sleep device: Anytime you are sleeping, including during daytime naps. While taking prescription pain medicines, sleeping medicines, or medicines that make you drowsy. Return to your normal activities as told by your health care provider. Ask your health care provider what activities are safe for you. Take over-the-counter  and prescription medicines only as told by your health care provider. Do not use any products that contain nicotine or tobacco. These products include cigarettes, chewing tobacco, and vaping devices, such as e-cigarettes. These can delay incision healing after surgery. If you need help quitting, ask your health care provider. Contact a health care provider if: You have nausea or vomiting that does not get better with medicine. You vomit every time you eat or drink. You have pain that does not get better with medicine. You cannot urinate or have bloody urine. You develop a skin rash. You have a fever. Get help right away if: You have trouble breathing. You have chest pain. You vomit blood. These symptoms may be an emergency. Get help right away. Call 911. Do not wait to see if the symptoms will go away. Do not drive yourself to the hospital. Summary After the procedure, it is common to have a sore throat, hoarseness, nausea, vomiting, or to feel weak, sleepy, or fatigue. For the time period you were told by your health care provider, do not drive or use machinery. Get help right away if you have difficulty breathing, have chest pain, or vomit blood. These symptoms may be an emergency. This information is not intended to replace advice given to you by your health care provider. Make sure you discuss any questions you have with your health care provider. Document Revised: 08/29/2021 Document Reviewed: 08/29/2021 Elsevier Patient Education  2024 [employer]How to Use Chlorhexidine  at Home in the Shower Chlorhexidine  gluconate (CHG) is a germ-killing (antiseptic) wash that's used to clean the skin. It can get rid of the germs that normally live on the skin and can keep them away for about 24 hours. If you're having surgery, you may be told to shower with CHG at home the night before surgery. This can help lower your risk for infection. To use CHG wash in the shower, follow the steps  below. Supplies needed: CHG body wash. Clean washcloth. Clean towel. How to use CHG in the shower Follow these steps unless you're told to use CHG in a different way: Start the shower. Use your normal soap and shampoo to wash your face and hair. Turn off the shower or move out of the shower stream. Pour CHG onto a clean washcloth. Do not use any type of brush or rough sponge. Start at your neck, washing your body down to your toes. Make sure you: Wash the part of your body where the surgery will be done for at least 1 minute. Do not scrub. Do not use CHG on your head or face unless your health care provider tells you to. If it gets into your ears or eyes, rinse them well with water . Do not wash your genitals with CHG. Wash your back and under your arms. Make sure to wash skin folds. Let the CHG sit on your skin for 1-2 minutes or as long as told. Rinse your entire body in the shower, including all  body creases and folds. Turn off the shower. Dry off with a clean towel. Do not put anything on your skin afterward, such as powder, lotion, or perfume. Put on clean clothes or pajamas. If it's the night before surgery, sleep in clean sheets. General tips Use CHG only as told, and follow the instructions on the label. Use the full amount of CHG as told. This is often one bottle. Do not smoke and stay away from flames after using CHG. Your skin may feel sticky after using CHG. This is normal. The sticky feeling will go away as the CHG dries. Do not use CHG: If you have a chlorhexidine  allergy or have reacted to chlorhexidine  in the past. On open wounds or areas of skin that have broken skin, cuts, or scrapes. On babies younger than 94 months of age. Contact a health care provider if: You have questions about using CHG. Your skin gets irritated or itchy. You have a rash after using CHG. You swallow any CHG. Call your local poison control center [phone] in the [place] Your eyes itch  badly, or they become very red or swollen. Your hearing changes. You have trouble seeing. If you can't reach your provider, go to an urgent care or emergency room. Do not drive yourself. Get help right away if: You have swelling or tingling in your mouth or throat. You make high-pitched whistling sounds when you breathe, most often when you breathe out (wheeze). You have trouble breathing. These symptoms may be an emergency. Call 911 right away. Do not wait to see if the symptoms will go away. Do not drive yourself to the hospital. This information is not intended to replace advice given to you by your health care provider. Make sure you discuss any questions you have with your health care provider. Document Revised: 12/15/2022 Document Reviewed: 12/11/2021 Elsevier Patient Education  2024 [employer].

## 2024-03-08 ENCOUNTER — Encounter (HOSPITAL_COMMUNITY)
Admission: RE | Admit: 2024-03-08 | Discharge: 2024-03-08 | Disposition: A | Discharge status: Home / Self Care | Source: Ambulatory Visit | Attending: Urology | Admitting: Urology

## 2024-03-08 ENCOUNTER — Encounter (HOSPITAL_COMMUNITY): Payer: Self-pay

## 2024-03-08 VITALS — BP 129/71 | HR 95 | Resp 18 | Ht 62.5 in | Wt 160.1 lb

## 2024-03-08 DIAGNOSIS — Z01812 Encounter for preprocedural laboratory examination: Secondary | ICD-10-CM | POA: Diagnosis not present

## 2024-03-08 DIAGNOSIS — E876 Hypokalemia: Secondary | ICD-10-CM | POA: Diagnosis not present

## 2024-03-08 DIAGNOSIS — N939 Abnormal uterine and vaginal bleeding, unspecified: Secondary | ICD-10-CM | POA: Insufficient documentation

## 2024-03-08 DIAGNOSIS — I10 Essential (primary) hypertension: Secondary | ICD-10-CM | POA: Diagnosis not present

## 2024-03-08 DIAGNOSIS — Z01810 Encounter for preprocedural cardiovascular examination: Secondary | ICD-10-CM | POA: Diagnosis not present

## 2024-03-08 DIAGNOSIS — Z01818 Encounter for other preprocedural examination: Secondary | ICD-10-CM | POA: Insufficient documentation

## 2024-03-08 LAB — BASIC METABOLIC PANEL WITH GFR
Anion gap: 11 (ref 5–15)
BUN: 14 mg/dL (ref 6–20)
CO2: 27 mmol/L (ref 22–32)
Calcium: 8.9 mg/dL (ref 8.9–10.3)
Chloride: 98 mmol/L (ref 98–111)
Creatinine, Ser: 0.75 mg/dL (ref 0.44–1.00)
GFR, Estimated: >60 mL/min (ref >60)
Glucose, Bld: 88 mg/dL (ref 70–99)
Potassium: 3.3 mmol/L — ABNORMAL LOW (ref 3.5–5.1)
Sodium: 136 mmol/L (ref 135–145)

## 2024-03-08 LAB — POCT PREGNANCY, URINE: Preg Test, Ur: NEGATIVE

## 2024-03-09 ENCOUNTER — Other Ambulatory Visit

## 2024-03-09 LAB — URINE CULTURE

## 2024-03-10 ENCOUNTER — Telehealth: Payer: Self-pay

## 2024-03-10 NOTE — Telephone Encounter (Signed)
 Return call to pt. Pt state's her blood work shows low potassium and want to make sure that would not affect her upcoming surgery with Dr. Sherrilee. Confirmed with Dr. Sherrilee about pt potassium. Pt is made aware low potassium will not affect her upcoming surgery but she was advised that she need to reach out to her PCP on advisement for her low potassium. Verbalized understanding.

## 2024-03-13 ENCOUNTER — Other Ambulatory Visit: Payer: Self-pay

## 2024-03-13 ENCOUNTER — Ambulatory Visit (HOSPITAL_COMMUNITY): Admitting: Certified Registered Nurse Anesthetist

## 2024-03-13 ENCOUNTER — Ambulatory Visit (HOSPITAL_COMMUNITY)
Admission: RE | Admit: 2024-03-13 | Discharge: 2024-03-13 | Disposition: A | Discharge status: Home / Self Care | Attending: Urology | Admitting: Urology

## 2024-03-13 ENCOUNTER — Encounter (HOSPITAL_COMMUNITY)
Admission: RE | Disposition: A | Discharge status: Home / Self Care | Payer: Self-pay | Source: Home / Self Care | Attending: Urology

## 2024-03-13 ENCOUNTER — Ambulatory Visit (HOSPITAL_COMMUNITY)

## 2024-03-13 ENCOUNTER — Encounter (HOSPITAL_COMMUNITY): Payer: Self-pay | Admitting: Urology

## 2024-03-13 DIAGNOSIS — I10 Essential (primary) hypertension: Secondary | ICD-10-CM | POA: Insufficient documentation

## 2024-03-13 DIAGNOSIS — K219 Gastro-esophageal reflux disease without esophagitis: Secondary | ICD-10-CM | POA: Diagnosis not present

## 2024-03-13 DIAGNOSIS — N200 Calculus of kidney: Secondary | ICD-10-CM

## 2024-03-13 DIAGNOSIS — N201 Calculus of ureter: Secondary | ICD-10-CM

## 2024-03-13 HISTORY — PX: CYSTOSCOPY/URETEROSCOPY/HOLMIUM LASER/STENT PLACEMENT: SHX6546

## 2024-03-13 HISTORY — PX: HOLMIUM LASER APPLICATION: SHX5852

## 2024-03-13 SURGERY — CYSTOSCOPY/URETEROSCOPY/HOLMIUM LASER/STENT PLACEMENT
Anesthesia: General | Site: Ureter | Laterality: Left

## 2024-03-13 MED ORDER — FENTANYL CITRATE (PF) 100 MCG/2ML IJ SOLN
INTRAMUSCULAR | Status: DC | PRN
  Administered 2024-03-13 (×2): 50 ug via INTRAVENOUS

## 2024-03-13 MED ORDER — ROCURONIUM BROMIDE 10 MG/ML (PF) SYRINGE
PREFILLED_SYRINGE | INTRAVENOUS | Status: AC
  Filled 2024-03-13: qty 10

## 2024-03-13 MED ORDER — ONDANSETRON HCL 4 MG/2ML IJ SOLN
INTRAMUSCULAR | Status: DC | PRN
  Administered 2024-03-13: 4 mg via INTRAVENOUS

## 2024-03-13 MED ORDER — CEFAZOLIN SODIUM-DEXTROSE 2-4 GM/100ML-% IV SOLN
INTRAVENOUS | Status: AC
  Filled 2024-03-13: qty 100

## 2024-03-13 MED ORDER — DIATRIZOATE MEGLUMINE 30 % UR SOLN
URETHRAL | Status: DC | PRN
  Administered 2024-03-13: 6 mL via URETHRAL

## 2024-03-13 MED ORDER — CHLORHEXIDINE GLUCONATE 0.12 % MT SOLN
15.0000 mL | Freq: Once | OROMUCOSAL | Status: AC
  Administered 2024-03-13: 15 mL via OROMUCOSAL
  Filled 2024-03-13: qty 15

## 2024-03-13 MED ORDER — CEFAZOLIN SODIUM-DEXTROSE 2-4 GM/100ML-% IV SOLN
2.0000 g | INTRAVENOUS | Status: AC
  Administered 2024-03-13: 2 g via INTRAVENOUS
  Filled 2024-03-13: qty 100

## 2024-03-13 MED ORDER — MIDAZOLAM HCL 2 MG/2ML IJ SOLN
INTRAMUSCULAR | Status: AC
  Administered 2024-03-13: 1 mg via INTRAVENOUS
  Filled 2024-03-13: qty 2

## 2024-03-13 MED ORDER — FENTANYL CITRATE PF 50 MCG/ML IJ SOSY
25.0000 ug | PREFILLED_SYRINGE | INTRAMUSCULAR | Status: DC | PRN
  Administered 2024-03-13: 50 ug via INTRAVENOUS
  Filled 2024-03-13: qty 1

## 2024-03-13 MED ORDER — TAMSULOSIN HCL 0.4 MG PO CAPS: 0.4000 mg | ORAL_CAPSULE | Freq: Every day | ORAL | 11 refills | Status: DC

## 2024-03-13 MED ORDER — SUGAMMADEX SODIUM 200 MG/2ML IV SOLN
INTRAVENOUS | Status: DC | PRN
  Administered 2024-03-13: 145.2 mg via INTRAVENOUS

## 2024-03-13 MED ORDER — SODIUM CHLORIDE 0.9 % IR SOLN
Status: DC | PRN
  Administered 2024-03-13: 3000 mL via INTRAVESICAL

## 2024-03-13 MED ORDER — OXYCODONE HCL 5 MG PO TABS
5.0000 mg | ORAL_TABLET | Freq: Once | ORAL | Status: AC | PRN
  Administered 2024-03-13: 5 mg via ORAL
  Filled 2024-03-13: qty 1

## 2024-03-13 MED ORDER — LIDOCAINE 2% (20 MG/ML) 5 ML SYRINGE
INTRAMUSCULAR | Status: DC | PRN
  Administered 2024-03-13: 60 mg via INTRAVENOUS

## 2024-03-13 MED ORDER — ONDANSETRON HCL 4 MG/2ML IJ SOLN
INTRAMUSCULAR | Status: AC
  Filled 2024-03-13: qty 2

## 2024-03-13 MED ORDER — DEXAMETHASONE SODIUM PHOSPHATE 10 MG/ML IJ SOLN: INTRAMUSCULAR | Status: DC | PRN

## 2024-03-13 MED ORDER — PROPOFOL 10 MG/ML IV BOLUS
INTRAVENOUS | Status: AC
  Filled 2024-03-13: qty 20

## 2024-03-13 MED ORDER — OXYCODONE HCL 5 MG/5ML PO SOLN: 5.0000 mg | Freq: Once | ORAL | Status: AC | PRN

## 2024-03-13 MED ORDER — ROCURONIUM BROMIDE 10 MG/ML (PF) SYRINGE
PREFILLED_SYRINGE | INTRAVENOUS | Status: DC | PRN
  Administered 2024-03-13: 40 mg via INTRAVENOUS

## 2024-03-13 MED ORDER — WATER FOR IRRIGATION, STERILE IR SOLN
Status: DC | PRN
  Administered 2024-03-13: 1000 mL

## 2024-03-13 MED ORDER — SODIUM CHLORIDE 0.9 % IV SOLN
12.5000 mg | INTRAVENOUS | Status: DC | PRN
  Administered 2024-03-13: 12.5 mg via INTRAVENOUS
  Filled 2024-03-13: qty 0.5

## 2024-03-13 MED ORDER — LIDOCAINE 2% (20 MG/ML) 5 ML SYRINGE
INTRAMUSCULAR | Status: AC
  Filled 2024-03-13: qty 5

## 2024-03-13 MED ORDER — MIDAZOLAM HCL 2 MG/2ML IJ SOLN
1.0000 mg | INTRAMUSCULAR | Status: DC | PRN
  Administered 2024-03-13: 1 mg via INTRAVENOUS

## 2024-03-13 MED ORDER — LACTATED RINGERS IV SOLN: INTRAVENOUS | Status: DC

## 2024-03-13 MED ORDER — PROPOFOL 10 MG/ML IV BOLUS
INTRAVENOUS | Status: DC | PRN
  Administered 2024-03-13: 150 mg via INTRAVENOUS

## 2024-03-13 MED ORDER — DIATRIZOATE MEGLUMINE 30 % UR SOLN
URETHRAL | Status: AC
  Filled 2024-03-13: qty 100

## 2024-03-13 MED ORDER — ONDANSETRON HCL 4 MG PO TABS: 4.0000 mg | ORAL_TABLET | Freq: Three times a day (TID) | ORAL | 0 refills | Status: DC | PRN

## 2024-03-13 MED ORDER — OXYCODONE-ACETAMINOPHEN 5-325 MG PO TABS: 1.0000 | ORAL_TABLET | ORAL | 0 refills | Status: DC | PRN

## 2024-03-13 SURGICAL SUPPLY — 24 items
BAG DRAIN URO TABLE W/ADPT NS (BAG) ×1 IMPLANT
BAG HAMPER (MISCELLANEOUS) ×1 IMPLANT
BASKET NITINOL 4 WIRE 16 (BASKET) ×1 IMPLANT
CATH URETL OPEN END 6FR 70 (CATHETERS) ×1 IMPLANT
CLOTH BEACON ORANGE TIMEOUT ST (SAFETY) ×1 IMPLANT
EXTRACTOR STONE NITINOL NGAGE (UROLOGICAL SUPPLIES) ×1 IMPLANT
GLOVE BIO SURGEON STRL SZ8 (GLOVE) ×1 IMPLANT
GLOVE BIOGEL PI IND STRL 7.0 (GLOVE) ×2 IMPLANT
GOWN STRL REUS W/TWL LRG LVL3 (GOWN DISPOSABLE) ×1 IMPLANT
GOWN STRL REUS W/TWL XL LVL3 (GOWN DISPOSABLE) ×1 IMPLANT
GUIDEWIRE STR DUAL SENSOR (WIRE) ×1 IMPLANT
GUIDEWIRE STR ZIPWIRE 035X150 (MISCELLANEOUS) ×1 IMPLANT
KIT TURNOVER CYSTO (KITS) ×1 IMPLANT
MANIFOLD NEPTUNE II (INSTRUMENTS) ×1 IMPLANT
PACK CYSTO (CUSTOM PROCEDURE TRAY) ×1 IMPLANT
PAD ARMBOARD POSITIONER FOAM (MISCELLANEOUS) ×1 IMPLANT
POSITIONER HEAD 8X9X4 ADT (SOFTGOODS) ×1 IMPLANT
SHEATH NAVIGATOR HD 11/13X36 (SHEATH) ×1 IMPLANT
SOL .9 NS 3000ML IRR UROMATIC (IV SOLUTION) ×2 IMPLANT
STENT URET 6FRX26 CONTOUR (STENTS) IMPLANT
SYR 10ML LL (SYRINGE) ×1 IMPLANT
TOWEL OR 17X26 4PK STRL BLUE (TOWEL DISPOSABLE) ×1 IMPLANT
TRACTIP FLEXIVA PULS ID 200XHI (Laser) IMPLANT
WATER STERILE IRR 500ML POUR (IV SOLUTION) ×1 IMPLANT

## 2024-03-13 NOTE — Anesthesia Postprocedure Evaluation (Signed)
 Anesthesia Post Note  Patient: Bethany King  Procedure(s) Performed: CYSTOSCOPY/URETEROSCOPY/HOLMIUM LASER/STENT PLACEMENT (Left: Ureter) HOLMIUM LASER APPLICATION (Left: Ureter)  Patient location during evaluation: PACU Anesthesia Type: General Level of consciousness: awake and alert Pain management: pain level controlled Vital Signs Assessment: post-procedure vital signs reviewed and stable Respiratory status: spontaneous breathing, nonlabored ventilation and respiratory function stable Cardiovascular status: blood pressure returned to baseline and stable Postop Assessment: no apparent nausea or vomiting Anesthetic complications: no   There were no known notable events for this encounter.   Last Vitals:  Vitals:   03/13/24 1504 03/13/24 1505  BP:  138/76  Pulse: 62   Resp: 18   Temp: 36.5 C   SpO2: 100% 100%    Last Pain:  Vitals:   03/13/24 1504  TempSrc: Oral  PainSc: 5                  Nellie Pester L Armentha Branagan

## 2024-03-13 NOTE — Op Note (Signed)
.  Preoperative diagnosis: Left ureteral stone  Postoperative diagnosis: Same  Procedure: 1 cystoscopy 2. Left retrograde pyelography 3.  Intraoperative fluoroscopy, under one hour, with interpretation 4.  Left ureteroscopic stone manipulation with laser lithotripsy 5.  Left 6 x 26 JJ stent placement  Attending: [name]  Anesthesia: General  Estimated blood loss: None  Drains: Left 6 x 26 JJ ureteral stent with tether  Specimens: stone for analysis  Antibiotics: ancef   Findings: left mid and lower pole stones. Large lower pole partial staghorn unable to be removed. No hydronephrosis. No masses/lesions in the bladder. Ureteral orifices in normal anatomic location.  Indications: Patient is a 53 year old female with a history of left renal stone and who has persistent left flank pain.  After discussing treatment options, she decided proceed with left ureteroscopic stone manipulation.  Procedure in detail: The patient was brought to the operating room and a brief timeout was done to ensure correct patient, correct procedure, correct site.  General anesthesia was administered patient was placed in dorsal lithotomy position.  Her genitalia was then prepped and draped in usual sterile fashion.  A rigid 22 French cystoscope was passed in the urethra and the bladder.  Bladder was inspected free masses or lesions.  the ureteral orifices were in the normal orthotopic locations.  a 6 french ureteral catheter was then instilled into the left ureteral orifice.  a gentle retrograde was obtained and findings noted above.  we then placed a zip wire through the ureteral catheter and advanced up to the renal pelvis.  we then removed the cystoscope and cannulated the left ureteral orifice with a semirigid ureteroscope.  No stone was found in the ureter. Once we reached the UPJ a sensor wire was advanced in to the renal pelvis. We then removed the ureteroscope and advanced am 12/14 x 38cm access sheath  up to the renal pelvis. We then used the flexible ureteroscope to perform nephroscopy. We encountered the stones in the mid and lower poles. Using a 242nm laser fiber the stones were fragmented.   the fragments were then removed with a Ngage basket.    once all stone fragments were removed we then removed the access sheath under direct vision and noted no injury to the ureter. We then placed a 6 x 26 double-j ureteral stent over the original zip wire.  We then removed the wire and good coil was noted in the the renal pelvis under fluoroscopy and the bladder under direct vision. the bladder was then drained and this concluded the procedure which was well tolerated by patient.  Complications: None  Condition: Stable, extubated, transferred to PACU  Plan: Patient is to be discharged home as to follow-up in one week. She is to remove her stent in 72 hours by pulling the tether.

## 2024-03-13 NOTE — Anesthesia Procedure Notes (Signed)
 Procedure Name: Intubation Date/Time: 03/13/2024 1:17 PM  Performed by: Harrold Macintosh, CRNAPre-anesthesia Checklist: Patient identified, Emergency Drugs available, Suction available and Patient being monitored Patient Re-evaluated:Patient Re-evaluated prior to induction Oxygen Delivery Method: Circle system utilized Preoxygenation: Pre-oxygenation with 100% oxygen Induction Type: IV induction Ventilation: Mask ventilation without difficulty Laryngoscope Size: Miller and 2 Grade View: Grade II Tube type: Oral Tube size: 7.0 mm Number of attempts: 1 Airway Equipment and Method: Stylet, Oral airway and Bite block Placement Confirmation: ETT inserted through vocal cords under direct vision, positive ETCO2 and breath sounds checked- equal and bilateral Secured at: 21 cm Tube secured with: Tape Dental Injury: Teeth and Oropharynx as per pre-operative assessment

## 2024-03-13 NOTE — Interval H&P Note (Signed)
 History and Physical Interval Note:  03/13/2024 12:56 PM  Bethany King  has presented today for surgery, with the diagnosis of Left Nephrolithiasis.  The various methods of treatment have been discussed with the patient and family. After consideration of risks, benefits and other options for treatment, the patient has consented to  Procedure(s): CYSTOSCOPY/URETEROSCOPY/HOLMIUM LASER/STENT PLACEMENT (Left) HOLMIUM LASER APPLICATION (Left) as a surgical intervention.  The patient's history has been reviewed, patient examined, no change in status, stable for surgery.  I have reviewed the patient's chart and labs.  Questions were answered to the patient's satisfaction.     [name]

## 2024-03-13 NOTE — Anesthesia Preprocedure Evaluation (Addendum)
 Anesthesia Evaluation  Patient identified by MRN, date of birth, ID band Patient awake    Reviewed: Allergy & Precautions, H&P , NPO status , Patient's Chart, lab work & pertinent test results, reviewed documented beta blocker date and time   History of Anesthesia Complications (+) PONV and history of anesthetic complications  Airway Mallampati: II  TM Distance: >3 FB Neck ROM: full    Dental  (+) Dental Advisory Given, Loose Loose left lower lateral incisor.  Told her it could come out.:   Pulmonary neg pulmonary ROS   Pulmonary exam normal breath sounds clear to auscultation       Cardiovascular Exercise Tolerance: Good hypertension, Normal cardiovascular exam Rhythm:regular Rate:Normal     Neuro/Psych  Headaches PSYCHIATRIC DISORDERS Anxiety Depression     Neuromuscular disease    GI/Hepatic Neg liver ROS,GERD  Medicated,,  Endo/Other  negative endocrine ROS    Renal/GU negative Renal ROS  negative genitourinary   Musculoskeletal  (+) Arthritis ,  Psoriatic arthritis   Abdominal   Peds  Hematology negative hematology ROS (+)   Anesthesia Other Findings   Reproductive/Obstetrics negative OB ROS                              Anesthesia Physical Anesthesia Plan  ASA: 2  Anesthesia Plan: General   Post-op Pain Management: Dilaudid  IV   Induction: Intravenous  PONV Risk Score and Plan: Ondansetron , Dexamethasone , Midazolam  and Scopolamine  patch - Pre-op  Airway Management Planned: Oral ETT  Additional Equipment: None  Intra-op Plan:   Post-operative Plan:   Informed Consent: I have reviewed the patients History and Physical, chart, labs and discussed the procedure including the risks, benefits and alternatives for the proposed anesthesia with the patient or authorized representative who has indicated his/her understanding and acceptance.     Dental Advisory Given  Plan  Discussed with: CRNA  Anesthesia Plan Comments:          Anesthesia Quick Evaluation

## 2024-03-13 NOTE — Transfer of Care (Signed)
 Immediate Anesthesia Transfer of Care Note  Patient: Bethany King  Procedure(s) Performed: CYSTOSCOPY/URETEROSCOPY/HOLMIUM LASER/STENT PLACEMENT (Left: Ureter) HOLMIUM LASER APPLICATION (Left: Ureter)  Patient Location: PACU  Anesthesia Type:General  Level of Consciousness: awake, alert , and oriented  Airway & Oxygen Therapy: Patient Spontanous Breathing and Patient connected to nasal cannula oxygen  Post-op Assessment: Report given to RN, Post -op Vital signs reviewed and stable, Patient moving all extremities X 4, and Patient able to stick tongue midline  Post vital signs: Reviewed and stable  Last Vitals:  Vitals Value Taken Time  BP 138/77 03/13/24 14:13  Temp 97.8   Pulse 69 03/13/24 14:14  Resp 15 03/13/24 14:14  SpO2 96 % 03/13/24 14:14  Vitals shown include unfiled device data.  Last Pain:  Vitals:   03/13/24 1058  TempSrc: Oral  PainSc: 0-No pain      Patients Stated Pain Goal: 5 (03/13/24 1058)  Complications: No notable events documented.

## 2024-03-13 NOTE — Interval H&P Note (Signed)
 History and Physical Interval Note:  03/13/2024 12:56 PM  Bethany King  has presented today for surgery, with the diagnosis of Left Nephrolithiasis.  The various methods of treatment have been discussed with the patient and family. After consideration of risks, benefits and other options for treatment, the patient has consented to  Procedure(s): CYSTOSCOPY/URETEROSCOPY/HOLMIUM LASER/STENT PLACEMENT (Left) HOLMIUM LASER APPLICATION (Left) as a surgical intervention.  The patient's history has been reviewed, patient examined, no change in status, stable for surgery.  I have reviewed the patient's chart and labs.  Questions were answered to the patient's satisfaction.     Bethany King

## 2024-03-14 ENCOUNTER — Encounter (HOSPITAL_COMMUNITY): Payer: Self-pay | Admitting: Urology

## 2024-03-22 LAB — STONE ANALYSIS
Calcium Oxalate Dihydrate: 70 %
Calcium Oxalate Monohydrate: 30 %
Weight Calculi: 58 mg

## 2024-03-27 ENCOUNTER — Telehealth: Payer: Self-pay | Admitting: *Deleted

## 2024-03-27 ENCOUNTER — Telehealth: Payer: Self-pay

## 2024-03-27 DIAGNOSIS — N3281 Overactive bladder: Secondary | ICD-10-CM

## 2024-03-27 DIAGNOSIS — N3010 Interstitial cystitis (chronic) without hematuria: Secondary | ICD-10-CM

## 2024-03-27 MED ORDER — PROGESTERONE 200 MG PO CAPS: ORAL_CAPSULE | ORAL | 3 refills | Status: AC

## 2024-03-27 MED ORDER — ELMIRON 100 MG PO CAPS
100.0000 mg | ORAL_CAPSULE | Freq: Two times a day (BID) | ORAL | 11 refills | Status: AC
Start: 2024-03-27 — End: ?

## 2024-03-27 MED ORDER — ESTRADIOL 1 MG PO TABS: ORAL_TABLET | ORAL | 3 refills | Status: AC

## 2024-03-27 NOTE — Telephone Encounter (Signed)
 Verbal from MD, McKenzie to send in Elmiron  100 mg BID sent to pharmacy. Called pt 's pharmacy to confirmed pt's medication Elmiron  sent. Voiced undrstanding

## 2024-03-27 NOTE — Telephone Encounter (Signed)
 Pt is switching pharmacy's and needs prescriptions on Estradiol  and Progesterone . Thanks! JSY

## 2024-03-27 NOTE — Telephone Encounter (Signed)
 Refilled estrace  1 mg 1 daily  and Prometrium  200 mg 1 daily to [facility] Has appt 05/16/24

## 2024-03-29 ENCOUNTER — Ambulatory Visit (INDEPENDENT_AMBULATORY_CARE_PROVIDER_SITE_OTHER): Admitting: Urology

## 2024-03-29 ENCOUNTER — Encounter: Payer: Self-pay | Admitting: Urology

## 2024-03-29 VITALS — BP 127/77 | HR 88

## 2024-03-29 DIAGNOSIS — N200 Calculus of kidney: Secondary | ICD-10-CM | POA: Diagnosis not present

## 2024-03-29 LAB — URINALYSIS, ROUTINE W REFLEX MICROSCOPIC
Bilirubin, UA: NEGATIVE
Glucose, UA: NEGATIVE
Ketones, UA: NEGATIVE
Nitrite, UA: NEGATIVE
Protein,UA: NEGATIVE
Specific Gravity, UA: <1.005 — ABNORMAL LOW (ref 1.005–1.030)
Urobilinogen, Ur: 0.2 mg/dL (ref 0.2–1.0)
pH, UA: 7 (ref 5.0–7.5)

## 2024-03-29 LAB — MICROSCOPIC EXAMINATION: Epithelial Cells (non renal): >10 /HPF — AB (ref 0–10)

## 2024-03-29 NOTE — Progress Notes (Signed)
 03/29/2024 10:08 AM   Bethany King May 24, 1971 [id number]  Referring provider: Bertell Satterfield, MD [address] [place],  [place] [address]  nephrolithiasis   HPI: [name] is a 53yo here for followup for nephrolithiasis. She passed multiple very small fragments since surgery. She has mild left flank pain which is intermittent. She has a known 13mm left lower pole calculus that was unable to be removed due to the acute angle of the lower pole.    PMH: Past Medical History:  Diagnosis Date   Allergic rhinitis    Anxiety    Arthritis    ASCUS of cervix with negative high risk HPV 04/20/2022   04/20/22 repeat in 3 years per ASCCP guidelines, 5 year risk CIN 3+ is 0.27%    Bladder pain    Complication of anesthesia    Costochondritis    hx   DDD (degenerative disc disease), lumbar    Depression    GERD (gastroesophageal reflux disease)    Hepatic hemangioma 2006   stable   History of adenomatous polyp of colon    History of colitis    History of gastritis    History of kidney stones    HTN (hypertension)    Interstitial cystitis    Irritable bowel syndrome (IBS)    Medullary sponge kidney    left   Migraine    Nephrolithiasis    bilateral-- nonobstructive per CT   PONV (postoperative nausea and vomiting)    Psoriatic arthritis (HCC)    Psoriatic arthritis (HCC)    Spinal stenosis    Urgency of urination     Surgical History: Past Surgical History:  Procedure Laterality Date   ANTERIOR CERVICAL DECOMP/DISCECTOMY FUSION  02/26/2014   C5 - C6; fusion with plate   BIOPSY  01/04/2019   Procedure: BIOPSY;  Surgeon: Golda Claudis PENNER, MD;  [facility];  Service: Endoscopy;;  gastric polyps   BIOPSY  02/10/2022   Procedure: BIOPSY;  Surgeon: Eartha Angelia Sieving, MD;  [facility];  Service: Gastroenterology;;   ORIN MEDIATE RELEASE Bilateral right 04-01-2010/  left 05-30-2010   CHOLECYSTECTOMY N/A 07/14/2013   Procedure:  LAPAROSCOPIC CHOLECYSTECTOMY;  Surgeon: Oneil DELENA Budge, MD;  Location: [facility];  Service: General;  Laterality: N/A;   COLONOSCOPY  10-13-2010   COLONOSCOPY N/A 01/04/2019   Procedure: COLONOSCOPY;  Surgeon: Golda Claudis PENNER, MD;  [facility];  Service: Endoscopy;  Laterality: N/A;   COLONOSCOPY WITH PROPOFOL  N/A 02/10/2022   Procedure: COLONOSCOPY WITH PROPOFOL ;  Surgeon: Eartha Angelia Sieving, MD;  [facility];  Service: Gastroenterology;  Laterality: N/A;  915 ASA 3   CYSTO WITH HYDRODISTENSION N/A 04/02/2015   Procedure: CYSTOSCOPY/HYDRODISTENSION, BLADDER BIOPSY WITH FULGURATION;  Surgeon: Norleen Seltzer, MD;  Location: [facility];  Service: Urology;  Laterality: N/A;   CYSTO WITH HYDRODISTENSION N/A 03/25/2018   Procedure: CYSTOSCOPY/HYDRODISTENSION, INSTILL MARCAIN AND PYRIDIUM ;  Surgeon: Seltzer Norleen, MD;  Location: [facility];  Service: Urology;  Laterality: N/A;   CYSTO WITH HYDRODISTENSION N/A 01/15/2020   Procedure: CYSTOSCOPY/HYDRODISTENSION;  Surgeon: Sherrilee Belvie CROME, MD;  Location: [facility];  Service: Urology;  Laterality: N/A;   CYSTOSCOPY W/ URETERAL STENT PLACEMENT Bilateral 04/08/2016   Procedure: CYSTOSCOPY WITH BILATERAL RETROGRADE PYELOGRAM, BILATERAL URETERAL STENT PLACEMENT;  Surgeon: Belvie CROME Sherrilee, MD;  Location: [facility];  Service: Urology;  Laterality: Bilateral;   CYSTOSCOPY W/ URETERAL STENT PLACEMENT Left 04/15/2016   Procedure: CYSTOSCOPY WITH RETROGRADE PYELOGRAM/URETERAL  STENT PLACEMENT;  Surgeon: Belvie LITTIE Clara, MD;  Location: [facility];  Service: Urology;  Laterality: Left;   CYSTOSCOPY WITH BIOPSY N/A 01/15/2020   Procedure: CYSTOSCOPY WITH BLADDER BIOPSY AND FULGERATION;  Surgeon: Clara Belvie LITTIE, MD;  [facility];  Service: Urology;  Laterality: N/A;   CYSTOSCOPY WITH HOLMIUM LASER LITHOTRIPSY Right 04/08/2016   Procedure: CYSTOSCOPY WITH RIGHT RENAL STONE EXTRACTION WITH HOLMIUM LASER LITHOTRIPSY;  Surgeon: Belvie LITTIE Clara, MD;  [facility];  Service: Urology;  Laterality: Right;   CYSTOSCOPY WITH HOLMIUM LASER LITHOTRIPSY Left 04/15/2016   Procedure: CYSTOSCOPY WITH HOLMIUM LASER LITHOTRIPSY;  Surgeon: Belvie LITTIE Clara, MD;  [facility];  Service: Urology;  Laterality: Left;   CYSTOSCOPY WITH RETROGRADE PYELOGRAM, URETEROSCOPY AND STENT PLACEMENT Left 07/05/2017   Procedure: CYSTOSCOPY WITH RETROGRADE PYELOGRAM, URETEROSCOPY AND STENT PLACEMENT;  Surgeon: Clara Belvie LITTIE, MD;  [facility]: [facility];  Service: Urology;  Laterality: Left;   CYSTOSCOPY/URETEROSCOPY/HOLMIUM LASER/STENT PLACEMENT Left 03/13/2024   Procedure: CYSTOSCOPY/URETEROSCOPY/HOLMIUM LASER/STENT PLACEMENT;  Surgeon: Clara Belvie LITTIE, MD;  [facility];  Service: Urology;  Laterality: Left;   DIAGNOSTIC LAPAROSCOPY     DILATION AND CURETTAGE OF UTERUS     DILITATION & CURRETTAGE/HYSTROSCOPY WITH THERMACHOICE ABLATION N/A 09/07/2012   Procedure: DILATATION & CURETTAGE/HYSTEROSCOPY WITH THERMACHOICE ABLATION;  Surgeon: Vonn VEAR Inch, MD;  Location: [facility];  Service: Gynecology;  Laterality: N/A;   ESOPHAGOGASTRODUODENOSCOPY  10/14/10   ESOPHAGOGASTRODUODENOSCOPY N/A 01/04/2019   Procedure: ESOPHAGOGASTRODUODENOSCOPY (EGD);  Surgeon: Golda Claudis PENNER, MD;  [facility];  Service: Endoscopy;  Laterality: N/A;  9:30   ESOPHAGOGASTRODUODENOSCOPY (EGD) WITH PROPOFOL  N/A 02/10/2022   Procedure: ESOPHAGOGASTRODUODENOSCOPY (EGD) WITH PROPOFOL ;  Surgeon: Eartha Angelia Sieving, MD;  [facility];  Service: Gastroenterology;  Laterality: N/A;   EXTRACORPOREAL SHOCK WAVE LITHOTRIPSY  multiple   EXTRACORPOREAL SHOCK WAVE LITHOTRIPSY Left 01/04/2024   Procedure: LITHOTRIPSY, ESWL;  Surgeon: Clara Belvie LITTIE, MD;  Location: [facility];  Service: Urology;  Laterality: Left;   HOLMIUM LASER APPLICATION Left 07/05/2017   Procedure: HOLMIUM LASER APPLICATION;  Surgeon: Clara Belvie LITTIE, MD;  [facility]: [facility];  Service:  Urology;  Laterality: Left;   HOLMIUM LASER APPLICATION Left 03/13/2024   Procedure: HOLMIUM LASER APPLICATION;  Surgeon: Clara Belvie LITTIE, MD;  [facility]: [facility];  Service: Urology;  Laterality: Left;   PERCUTANEOUS NEPHROSTOLITHOTOMY  2003   PLANTAR FASCIA SURGERY Bilateral right 2008//  left 2010   POLYPECTOMY  01/04/2019   Procedure: POLYPECTOMY;  Surgeon: Golda Claudis PENNER, MD;  [facility];  Service: Endoscopy;;  colon   STONE EXTRACTION WITH BASKET Left 04/15/2016   Procedure: STONE EXTRACTION WITH BASKET;  Surgeon: Belvie LITTIE Clara, MD;  Location: [facility];  Service: Urology;  Laterality: Left;    Home Medications:  Allergies as of 03/29/2024       Reactions   Hydromorphone  Itching   Prednisone Other (See Comments)   Increase anxiety symptoms, insomnia   Tramadol Other (See Comments)   Causes dizziness   Sulfa Antibiotics Rash        Medication List        Accurate as of March 29, 2024 10:08 AM. If you have any questions, ask your nurse or doctor.          albuterol  108 (90 Base) MCG/ACT inhaler Commonly known as: VENTOLIN  HFA Inhale 1-2 puffs into the lungs every 6 (six) hours as needed for shortness of breath or wheezing.   ALPRAZolam  0.5 MG  tablet Commonly known as: XANAX  Take 0.5 mg by mouth at bedtime.   cyclobenzaprine  10 MG tablet Commonly known as: FLEXERIL  Take 10 mg by mouth 3 (three) times daily as needed for muscle spasms.   DULoxetine 60 MG capsule Commonly known as: CYMBALTA Take 60 mg by mouth 2 (two) times daily.   Elmiron  100 MG capsule Generic drug: pentosan polysulfate Take 1 capsule (100 mg total) by mouth 2 (two) times daily.   estradiol  1 MG tablet Commonly known as: ESTRACE  TAKE 1 TABLET(1 MG) BY MOUTH DAILY   fluticasone  50 MCG/ACT nasal spray Commonly known as: FLONASE  Place 1 spray into both nostrils in the morning.   hydrOXYzine  10 MG tablet Commonly known as: ATARAX  Take 10 mg by mouth at bedtime.    loratadine  10 MG tablet Commonly known as: CLARITIN  Take 10 mg by mouth in the morning.   multivitamin with minerals Tabs tablet Take 1 tablet by mouth daily.   ondansetron  4 MG tablet Commonly known as: Zofran  Take 1 tablet (4 mg total) by mouth every 8 (eight) hours as needed for nausea or vomiting.   oxyCODONE -acetaminophen  5-325 MG tablet Commonly known as: Percocet Take 1 tablet by mouth every 4 (four) hours as needed.   pantoprazole  40 MG tablet Commonly known as: PROTONIX  Take 40 mg by mouth in the morning.   potassium chloride  10 MEQ tablet Commonly known as: KLOR-CON  Take 20 mEq by mouth 2 (two) times daily.   progesterone  200 MG capsule Commonly known as: PROMETRIUM  TAKE 1 CAPSULE(200 MG) BY MOUTH AT BEDTIME   Skyrizi Pen 150 MG/ML pen Generic drug: risankizumab-rzaa Inject 150 mg into the skin every 3 (three) months.   tamsulosin  0.4 MG Caps capsule Commonly known as: FLOMAX  Take 1 capsule (0.4 mg total) by mouth daily after supper.   triamterene-hydrochlorothiazide  37.5-25 MG capsule Commonly known as: DYAZIDE Take 1 capsule by mouth in the morning.   valACYclovir 1000 MG tablet Commonly known as: VALTREX Take 1,000 mg by mouth 3 (three) times daily as needed (outbreaks).        Allergies:  Allergies  Allergen Reactions   Hydromorphone  Itching   Prednisone Other (See Comments)    Increase anxiety symptoms, insomnia   Tramadol Other (See Comments)    Causes dizziness    Sulfa Antibiotics Rash    Family History: Family History  Problem Relation Age of Onset   Irritable bowel syndrome Mother    Scleroderma Mother    Rheum arthritis Mother    Asthma Mother    Melanoma Father    Cancer Father        Melanoma   Colon cancer Cousin 83       second cousin Harles Lovelace)   Cancer Maternal Grandmother        Ovarian   Diabetes Maternal Grandmother    Thyroid  disease Maternal Aunt     Social History:  reports that she has never  smoked. She has never been exposed to tobacco smoke. She has never used smokeless tobacco. She reports that she does not drink alcohol and does not use drugs.  ROS: All other review of systems were reviewed and are negative except what is noted above in HPI  Physical Exam: BP 127/77   Pulse 88   LMP 01/06/2024 (Approximate)   Constitutional:  Alert and oriented, No acute distress. HEENT: [place] AT, moist mucus membranes.  Trachea midline, no masses. Cardiovascular: No clubbing, cyanosis, or edema. Respiratory: Normal respiratory effort, no increased work of  breathing. GI: Abdomen is soft, nontender, nondistended, no abdominal masses GU: No CVA tenderness.  Lymph: No cervical or inguinal lymphadenopathy. Skin: No rashes, bruises or suspicious lesions. Neurologic: Grossly intact, no focal deficits, moving all 4 extremities. Psychiatric: Normal mood and affect.  Laboratory Data: Lab Results  Component Value Date   WBC 7.0 03/21/2018   HGB 10.9 (L) 03/21/2018   HCT 35.3 (L) 03/21/2018   MCV 96.7 03/21/2018   PLT 330 03/21/2018    Lab Results  Component Value Date   CREATININE 0.75 03/08/2024    No results found for: PSA  No results found for: TESTOSTERONE  Lab Results  Component Value Date   HGBA1C 5.7 (H) 09/15/2011    Urinalysis    Component Value Date/Time   COLORURINE AMBER (A) 11/17/2019 1600   APPEARANCEUR Clear 03/06/2024 1351   LABSPEC 1.004 (L) 11/17/2019 1600   PHURINE 6.0 11/17/2019 1600   GLUCOSEU Negative 03/06/2024 1351   HGBUR SMALL (A) 11/17/2019 1600   BILIRUBINUR Negative 03/06/2024 1351   KETONESUR negative 12/26/2023 1231   KETONESUR NEGATIVE 11/17/2019 1600   PROTEINUR Negative 03/06/2024 1351   PROTEINUR NEGATIVE 11/17/2019 1600   UROBILINOGEN 0.2 12/26/2023 1231   UROBILINOGEN 0.2 02/02/2013 1620   NITRITE Negative 03/06/2024 1351   NITRITE POSITIVE (A) 11/17/2019 1600   LEUKOCYTESUR 1+ (A) 03/06/2024 1351   LEUKOCYTESUR LARGE (A)  11/17/2019 1600    Lab Results  Component Value Date   LABMICR See below: 03/06/2024   WBCUA 6-10 (A) 03/06/2024   LABEPIT >10 (A) 03/06/2024   MUCUS Present 10/01/2021   BACTERIA None seen 03/06/2024    Pertinent Imaging:  Results for orders placed during the hospital encounter of 01/27/24  DG Abd 1 View  Narrative CLINICAL DATA:  Kidney stone.  Left flank pain.  EXAM: ABDOMEN - 1 VIEW  COMPARISON:  Most recent radiograph 01/04/2024  FINDINGS: There are multiple bilateral intrarenal calculi. Stable stone distribution from prior exam. Largest stone is in the lower left kidney measuring 13 mm. No evidence of ureteral or bladder stone. Normal bowel gas pattern with moderate colonic stool burden, unchanged from prior. Right upper quadrant surgical clips again seen.  IMPRESSION: Multiple bilateral intrarenal calculi, unchanged from prior exam. No evidence of ureteral stone.   Electronically Signed By: Andrea Gasman M.D. On: 02/10/2024 17:36  No results found for this or any previous visit.  No results found for this or any previous visit.  No results found for this or any previous visit.  Results for orders placed during the hospital encounter of 09/01/17  US  RENAL  Narrative CLINICAL DATA:  Kidney stones  EXAM: RENAL / URINARY TRACT ULTRASOUND COMPLETE  COMPARISON:  Abdominal ultrasound of August 03, 2017  FINDINGS: Right Kidney:  Length: 10.7 cm. There is cortical thinning diffusely. The cortical echotexture remains lower than that of the adjacent liver. There is a nonobstructing mid/upper pole stone measuring approximately 7 mm in diameter. There is no hydronephrosis.  Left Kidney:  Length: 11 cm. There is diffuse cortical thinning similar to that on the right. There is a mid to upper pole stone measuring 6 mm in diameter. There is no hydronephrosis.  Bladder:  Appears normal for degree of bladder distention. Bilateral ureteral jets  are observed.  IMPRESSION: Bilateral nonobstructing kidney stones. Diffuse renal cortical thinning. No hydronephrosis. Normal appearing urinary bladder.   Electronically Signed By: David  [place] M.D. On: 09/01/2017 10:23  No results found for this or any previous visit.  No  results found for this or any previous visit.  No results found for this or any previous visit.   Assessment & Plan:    1. Left nephrolithiasis (Primary) Followup 3 months with renal US   - Urinalysis, Routine w reflex microscopic   No follow-ups on file.  [name], MD  [facility] [place]

## 2024-03-29 NOTE — Patient Instructions (Signed)

## 2024-04-12 ENCOUNTER — Encounter: Admitting: Urology

## 2024-04-15 ENCOUNTER — Ambulatory Visit
Admission: RE | Admit: 2024-04-15 | Discharge: 2024-04-15 | Disposition: A | Discharge status: Home / Self Care | Payer: Self-pay | Source: Ambulatory Visit | Attending: Family Medicine | Admitting: Family Medicine

## 2024-04-15 VITALS — BP 147/74 | HR 80 | Temp 98.7°F | Resp 20

## 2024-04-15 DIAGNOSIS — J3089 Other allergic rhinitis: Secondary | ICD-10-CM | POA: Diagnosis not present

## 2024-04-15 DIAGNOSIS — H6993 Unspecified Eustachian tube disorder, bilateral: Secondary | ICD-10-CM | POA: Diagnosis not present

## 2024-04-15 MED ORDER — PREDNISONE 20 MG PO TABS: 40.0000 mg | ORAL_TABLET | Freq: Every day | ORAL | 0 refills | Status: DC

## 2024-04-15 MED ORDER — AZELASTINE HCL 0.1 % NA SOLN: 1.0000 | Freq: Two times a day (BID) | NASAL | 0 refills | Status: DC

## 2024-04-15 NOTE — ED Triage Notes (Signed)
 Pt reports ear fullness, pain in the left ear, started off a few weeks ago, now has sinus pressures, and intermittent sharp pain in the left ear.

## 2024-04-15 NOTE — ED Provider Notes (Signed)
 [facility]    CSN: [id number] Arrival date & time: 04/15/24  1144      History   Chief Complaint Chief Complaint  Patient presents with   Ear Fullness    Ear pain, sinus pain - Entered by patient    HPI Bethany King is a 53 y.o. female.   Patient presenting today with several week history of sinus issues and now progressively worsening ear pressure with sharp stabbing pains on the left.  Denies fever, chills, chest pain, shortness of breath, bleeding or drainage from the ears, loss of hearing.  Trying Sudafed with mild temporary benefit.  Takes antihistamines for seasonal allergies and Flonase .    Past Medical History:  Diagnosis Date   Allergic rhinitis    Anxiety    Arthritis    ASCUS of cervix with negative high risk HPV 04/20/2022   04/20/22 repeat in 3 years per ASCCP guidelines, 5 year risk CIN 3+ is 0.27%    Bladder pain    Complication of anesthesia    Costochondritis    hx   DDD (degenerative disc disease), lumbar    Depression    GERD (gastroesophageal reflux disease)    Hepatic hemangioma 2006   stable   History of adenomatous polyp of colon    History of colitis    History of gastritis    History of kidney stones    HTN (hypertension)    Interstitial cystitis    Irritable bowel syndrome (IBS)    Medullary sponge kidney    left   Migraine    Nephrolithiasis    bilateral-- nonobstructive per CT   PONV (postoperative nausea and vomiting)    Psoriatic arthritis (HCC)    Psoriatic arthritis (HCC)    Spinal stenosis    Urgency of urination     Patient Active Problem List   Diagnosis Date Noted   Nephrolithiasis 03/13/2024   Fibroid 05/03/2023   Irritable 04/19/2023   Urinary incontinence 04/19/2023   OAB (overactive bladder) 04/19/2023   Vaginal spotting 04/19/2023   ASCUS of cervix with negative high risk HPV 04/20/2022   Early satiety 01/06/2022   Hematochezia 01/06/2022   Bloating 01/06/2022   Hemorrhoids 12/09/2021    Rectal itching 12/09/2021   Overactive bladder 09/09/2020   Encounter for screening fecal occult blood testing 04/09/2020   Encounter for well woman exam with routine gynecological exam 04/09/2020   Hormone replacement therapy (HRT) 04/09/2020   Pelvic pain in female 04/05/2020   Urinary urgency 04/05/2020   Vaginal dryness 02/27/2020   Burning with urination 02/27/2020   Vaginal burning 02/27/2020   Chronic interstitial cystitis 02/09/2020   Menopause 04/04/2019   Screening for colorectal cancer 04/04/2019   Encounter for gynecological examination with Papanicolaou smear of cervix 04/04/2019   Routine cervical smear 04/04/2019   Vaginal dryness, menopausal 04/04/2019   Hot flashes 04/04/2019   Psoriatic arthritis (HCC) 12/26/2018   History of colonic polyps 12/26/2018   Pain of upper abdomen 12/26/2018   Abdominal pain 06/23/2014   Acute gastroenteritis 06/23/2014   Back pain with radiation 10/24/2012   Breast mass 10/09/2012   Seasonal allergies 09/22/2012   Hypokalemia 09/22/2012   Dysmenorrhea 08/30/2012   Sciatica 05/29/2012   Sinusitis 03/02/2012   Back pain 02/10/2012   Menorrhagia 12/30/2011   Shoulder pain, left 12/03/2011   Costochondritis 11/17/2011   Shoulder bursitis 11/17/2011   Recurrent UTI 10/09/2011   Migraine headache 10/08/2011   Obesity 09/04/2011   HTN (hypertension) 07/14/2011  Anxiety 07/14/2011   Diarrhea 05/19/2011   Chest pain 04/30/2011   IBS (irritable bowel syndrome) 09/23/2010   Epigastric pain 09/23/2010   Constipation 01/29/2010   Gastroesophageal reflux disease 01/24/2010    Past Surgical History:  Procedure Laterality Date   ANTERIOR CERVICAL DECOMP/DISCECTOMY FUSION  02/26/2014   C5 - C6; fusion with plate   BIOPSY  01/04/2019   Procedure: BIOPSY;  Surgeon: Golda Claudis PENNER, MD;  [facility]: [facility];  Service: Endoscopy;;  gastric polyps   BIOPSY  02/10/2022   Procedure: BIOPSY;  Surgeon: Eartha Angelia Sieving, MD;   [facility]: [facility];  Service: Gastroenterology;;   ORIN MEDIATE RELEASE Bilateral right 04-01-2010/  left 05-30-2010   CHOLECYSTECTOMY N/A 07/14/2013   Procedure: LAPAROSCOPIC CHOLECYSTECTOMY;  Surgeon: Oneil DELENA Budge, MD;  Location: [facility];  Service: General;  Laterality: N/A;   COLONOSCOPY  10-13-2010   COLONOSCOPY N/A 01/04/2019   Procedure: COLONOSCOPY;  Surgeon: Golda Claudis PENNER, MD;  Location: [facility];  Service: Endoscopy;  Laterality: N/A;   COLONOSCOPY WITH PROPOFOL  N/A 02/10/2022   Procedure: COLONOSCOPY WITH PROPOFOL ;  Surgeon: Eartha Angelia Sieving, MD;  [facility]: [facility];  Service: Gastroenterology;  Laterality: N/A;  915 ASA 3   CYSTO WITH HYDRODISTENSION N/A 04/02/2015   Procedure: CYSTOSCOPY/HYDRODISTENSION, BLADDER BIOPSY WITH FULGURATION;  Surgeon: Norleen Seltzer, MD;  Location: [facility];  Service: Urology;  Laterality: N/A;   CYSTO WITH HYDRODISTENSION N/A 03/25/2018   Procedure: CYSTOSCOPY/HYDRODISTENSION, INSTILL MARCAIN AND PYRIDIUM ;  Surgeon: Seltzer Norleen, MD;  Location: [facility];  Service: Urology;  Laterality: N/A;   CYSTO WITH HYDRODISTENSION N/A 01/15/2020   Procedure: CYSTOSCOPY/HYDRODISTENSION;  Surgeon: Sherrilee Belvie CROME, MD;  Location: [facility];  Service: Urology;  Laterality: N/A;   CYSTOSCOPY W/ URETERAL STENT PLACEMENT Bilateral 04/08/2016   Procedure: CYSTOSCOPY WITH BILATERAL RETROGRADE PYELOGRAM, BILATERAL URETERAL STENT PLACEMENT;  Surgeon: Belvie CROME Sherrilee, MD;  Location: [facility];  Service: Urology;  Laterality: Bilateral;   CYSTOSCOPY W/ URETERAL STENT PLACEMENT Left 04/15/2016   Procedure: CYSTOSCOPY WITH RETROGRADE PYELOGRAM/URETERAL STENT PLACEMENT;  Surgeon: Belvie CROME Sherrilee, MD;  Location: [facility];  Service: Urology;  Laterality: Left;   CYSTOSCOPY WITH BIOPSY N/A 01/15/2020   Procedure: CYSTOSCOPY WITH BLADDER BIOPSY AND FULGERATION;  Surgeon: Sherrilee Belvie CROME, MD;  Location: [facility];  Service: Urology;  Laterality: N/A;    CYSTOSCOPY WITH HOLMIUM LASER LITHOTRIPSY Right 04/08/2016   Procedure: CYSTOSCOPY WITH RIGHT RENAL STONE EXTRACTION WITH HOLMIUM LASER LITHOTRIPSY;  Surgeon: Belvie CROME Sherrilee, MD;  Location: [facility];  Service: Urology;  Laterality: Right;   CYSTOSCOPY WITH HOLMIUM LASER LITHOTRIPSY Left 04/15/2016   Procedure: CYSTOSCOPY WITH HOLMIUM LASER LITHOTRIPSY;  Surgeon: Belvie CROME Sherrilee, MD;  Location: [facility];  Service: Urology;  Laterality: Left;   CYSTOSCOPY WITH RETROGRADE PYELOGRAM, URETEROSCOPY AND STENT PLACEMENT Left 07/05/2017   Procedure: CYSTOSCOPY WITH RETROGRADE PYELOGRAM, URETEROSCOPY AND STENT PLACEMENT;  Surgeon: Sherrilee Belvie CROME, MD;  Location: [facility];  Service: Urology;  Laterality: Left;   CYSTOSCOPY/URETEROSCOPY/HOLMIUM LASER/STENT PLACEMENT Left 03/13/2024   Procedure: CYSTOSCOPY/URETEROSCOPY/HOLMIUM LASER/STENT PLACEMENT;  Surgeon: Sherrilee Belvie CROME, MD;  Location: [facility];  Service: Urology;  Laterality: Left;   DIAGNOSTIC LAPAROSCOPY     DILATION AND CURETTAGE OF UTERUS     DILITATION & CURRETTAGE/HYSTROSCOPY WITH THERMACHOICE ABLATION N/A 09/07/2012   Procedure: DILATATION & CURETTAGE/HYSTEROSCOPY WITH THERMACHOICE ABLATION;  Surgeon: Vonn VEAR Inch, MD;  Location: [facility];  Service: Gynecology;  Laterality: N/A;   ESOPHAGOGASTRODUODENOSCOPY  10/14/10  ESOPHAGOGASTRODUODENOSCOPY N/A 01/04/2019   Procedure: ESOPHAGOGASTRODUODENOSCOPY (EGD);  Surgeon: Golda Claudis PENNER, MD;  [facility];  Service: Endoscopy;  Laterality: N/A;  9:30   ESOPHAGOGASTRODUODENOSCOPY (EGD) WITH PROPOFOL  N/A 02/10/2022   Procedure: ESOPHAGOGASTRODUODENOSCOPY (EGD) WITH PROPOFOL ;  Surgeon: Eartha Angelia Sieving, MD;  [facility]: [facility];  Service: Gastroenterology;  Laterality: N/A;   EXTRACORPOREAL SHOCK WAVE LITHOTRIPSY  multiple   EXTRACORPOREAL SHOCK WAVE LITHOTRIPSY Left 01/04/2024   Procedure: LITHOTRIPSY, ESWL;  Surgeon: Sherrilee Belvie CROME, MD;  Location: [facility];  Service: Urology;   Laterality: Left;   HOLMIUM LASER APPLICATION Left 07/05/2017   Procedure: HOLMIUM LASER APPLICATION;  Surgeon: Sherrilee Belvie CROME, MD;  Location: [facility];  Service: Urology;  Laterality: Left;   HOLMIUM LASER APPLICATION Left 03/13/2024   Procedure: HOLMIUM LASER APPLICATION;  Surgeon: Sherrilee Belvie CROME, MD;  Location: [facility];  Service: Urology;  Laterality: Left;   PERCUTANEOUS NEPHROSTOLITHOTOMY  2003   PLANTAR FASCIA SURGERY Bilateral right 2008//  left 2010   POLYPECTOMY  01/04/2019   Procedure: POLYPECTOMY;  Surgeon: Golda Claudis PENNER, MD;  [facility];  Service: Endoscopy;;  colon   STONE EXTRACTION WITH BASKET Left 04/15/2016   Procedure: STONE EXTRACTION WITH BASKET;  Surgeon: Belvie CROME Sherrilee, MD;  Location: [facility];  Service: Urology;  Laterality: Left;    OB History     Gravida  0   Para      Term      Preterm      AB      Living  0      SAB      IAB      Ectopic      Multiple      Live Births               Home Medications    Prior to Admission medications   Medication Sig Start Date End Date Taking? Authorizing Provider  azelastine (ASTELIN) 0.1 % nasal spray Place 1 spray into both nostrils 2 (two) times daily. Use in each nostril as directed 04/15/24  Yes Stuart Vernell Norris, PA-C  predniSONE (DELTASONE) 20 MG tablet Take 2 tablets (40 mg total) by mouth daily with breakfast. 04/15/24  Yes Stuart Vernell Norris, PA-C  albuterol  (VENTOLIN  HFA) 108 (90 Base) MCG/ACT inhaler Inhale 1-2 puffs into the lungs every 6 (six) hours as needed for shortness of breath or wheezing.    [provider]  ALPRAZolam  (XANAX ) 0.5 MG tablet Take 0.5 mg by mouth at bedtime. 11/10/19   [provider]  cyclobenzaprine  (FLEXERIL ) 10 MG tablet Take 10 mg by mouth 3 (three) times daily as needed for muscle spasms.    [provider]  DULoxetine (CYMBALTA) 60 MG capsule Take 60 mg by mouth 2 (two) times daily.    [provider]  estradiol  (ESTRACE ) 1 MG tablet TAKE 1 TABLET(1 MG) BY MOUTH DAILY 03/27/24   Signa Nest A, NP  fluticasone  (FLONASE ) 50 MCG/ACT nasal spray Place 1 spray into both nostrils in the morning.    [provider]  hydrOXYzine  (ATARAX ) 10 MG tablet Take 10 mg by mouth at bedtime. 03/18/24   [provider]  loratadine  (CLARITIN ) 10 MG tablet Take 10 mg by mouth in the morning.    [provider]  Multiple Vitamin (MULTIVITAMIN WITH MINERALS) TABS tablet Take 1 tablet by mouth daily.    [provider]  ondansetron  (ZOFRAN ) 4 MG tablet Take 1 tablet (4 mg  total) by mouth every 8 (eight) hours as needed for nausea or vomiting. 03/13/24   McKenzie, Belvie CROME, MD  oxyCODONE -acetaminophen  (PERCOCET) 5-325 MG tablet Take 1 tablet by mouth every 4 (four) hours as needed. 03/13/24   McKenzie, Belvie CROME, MD  pantoprazole  (PROTONIX ) 40 MG tablet Take 40 mg by mouth in the morning.    [provider]  pentosan polysulfate (ELMIRON ) 100 MG capsule Take 1 capsule (100 mg total) by mouth 2 (two) times daily. 03/27/24   McKenzie, Belvie CROME, MD  potassium chloride  (KLOR-CON ) 10 MEQ tablet Take 20 mEq by mouth 2 (two) times daily. 10/25/23   [provider]  progesterone  (PROMETRIUM ) 200 MG capsule TAKE 1 CAPSULE(200 MG) BY MOUTH AT BEDTIME 03/27/24   Signa Nest A, NP  SKYRIZI PEN 150 MG/ML pen Inject 150 mg into the skin every 3 (three) months. 12/31/23   [provider]  tamsulosin  (FLOMAX ) 0.4 MG CAPS capsule Take 1 capsule (0.4 mg total) by mouth daily after supper. 03/13/24   McKenzie, Belvie CROME, MD  triamterene-hydrochlorothiazide  (DYAZIDE) 37.5-25 MG capsule Take 1 capsule by mouth in the morning. 01/09/18   [provider]  valACYclovir (VALTREX) 1000 MG tablet Take 1,000 mg by mouth 3 (three) times daily as needed (outbreaks).    [provider]  Adalimumab (HUMIRA PEN) 40 MG/0.4ML PNKT Inject 40 mg into the skin every 14  (fourteen) days. Twice a month   08/09/19  [provider]    Family History Family History  Problem Relation Age of Onset   Irritable bowel syndrome Mother    Scleroderma Mother    Rheum arthritis Mother    Asthma Mother    Melanoma Father    Cancer Father        Melanoma   Colon cancer Cousin 51       second cousin Harles Lovelace)   Cancer Maternal Grandmother        Ovarian   Diabetes Maternal Grandmother    Thyroid  disease Maternal Aunt     Social History Social History   Tobacco Use   Smoking status: Never    Passive exposure: Never   Smokeless tobacco: Never  Vaping Use   Vaping status: Never Used  Substance Use Topics   Alcohol use: No   Drug use: No     Allergies   Hydromorphone , Prednisone, Tramadol, and Sulfa antibiotics   Review of Systems Review of Systems Per HPI  Physical Exam Triage Vital Signs ED Triage Vitals  Encounter Vitals Group     BP 04/15/24 1215 (!) 147/74     Girls Systolic BP Percentile --      Girls Diastolic BP Percentile --      Boys Systolic BP Percentile --      Boys Diastolic BP Percentile --      Pulse Rate 04/15/24 1215 80     Resp 04/15/24 1215 20     Temp 04/15/24 1215 98.7 F (37.1 C)     Temp Source 04/15/24 1215 Oral     SpO2 04/15/24 1215 98 %     Weight --      Height --      Head Circumference --      Peak Flow --      Pain Score 04/15/24 1217 5     Pain Loc --      Pain Education --      Exclude from Growth Chart --    No data found.  Updated  Vital Signs BP (!) 147/74 (BP Location: Right Arm)   Pulse 80   Temp 98.7 F (37.1 C) (Oral)   Resp 20   SpO2 98%   Visual Acuity Right Eye Distance:   Left Eye Distance:   Bilateral Distance:    Right Eye Near:   Left Eye Near:    Bilateral Near:     Physical Exam Vitals and nursing note reviewed.  Constitutional:      Appearance: Normal appearance. She is not ill-appearing.  HENT:     Head: Atraumatic.     Ears:     Comments:  Bilateral middle ear effusion    Nose:     Comments: Bilateral nasal turbinates boggy and erythematous    Mouth/Throat:     Mouth: Mucous membranes are moist.     Pharynx: Oropharynx is clear.  Eyes:     Extraocular Movements: Extraocular movements intact.     Conjunctiva/sclera: Conjunctivae normal.  Cardiovascular:     Rate and Rhythm: Normal rate.  Pulmonary:     Effort: Pulmonary effort is normal.  Musculoskeletal:        General: Normal range of motion.     Cervical back: Normal range of motion and neck supple.  Skin:    General: Skin is warm and dry.  Neurological:     Mental Status: She is alert and oriented to person, place, and time.  Psychiatric:        Mood and Affect: Mood normal.        Thought Content: Thought content normal.        Judgment: Judgment normal.      UC Treatments / Results  Labs (all labs ordered are listed, but only abnormal results are displayed) Labs Reviewed - No data to display  EKG   Radiology No results found.  Procedures Procedures (including critical care time)  Medications Ordered in UC Medications - No data to display  Initial Impression / Assessment and Plan / UC Course  I have reviewed the triage vital signs and the nursing notes.  Pertinent labs & imaging results that were available during my care of the patient were reviewed by me and considered in my medical decision making (see chart for details).     Minimally hypertensive in triage, otherwise vital signs within normal limits.  Suspect allergic rhinitis leading to eustachian tube dysfunction bilaterally.  Will add short course of prednisone, Astelin to Zyrtec and Flonase  regimen.  Discussed supportive over-the-counter medications, home care return precautions.  Final Clinical Impressions(s) / UC Diagnoses   Final diagnoses:  Eustachian tube dysfunction, bilateral  Seasonal allergic rhinitis due to other allergic trigger   Discharge Instructions   None    ED  Prescriptions     Medication Sig Dispense Auth. Provider   predniSONE (DELTASONE) 20 MG tablet Take 2 tablets (40 mg total) by mouth daily with breakfast. 6 tablet Stuart Vernell Norris, PA-C   azelastine (ASTELIN) 0.1 % nasal spray Place 1 spray into both nostrils 2 (two) times daily. Use in each nostril as directed 30 mL Stuart Vernell Norris, PA-C      PDMP not reviewed this encounter.   Stuart Vernell Norris, [place] 04/15/24 1334

## 2024-04-24 DIAGNOSIS — Z23 Encounter for immunization: Secondary | ICD-10-CM | POA: Diagnosis not present

## 2024-04-26 ENCOUNTER — Ambulatory Visit: Admitting: Urology

## 2024-05-16 ENCOUNTER — Ambulatory Visit: Admitting: Adult Health

## 2024-05-24 ENCOUNTER — Encounter

## 2024-05-25 ENCOUNTER — Ambulatory Visit
Admission: RE | Admit: 2024-05-25 | Discharge: 2024-05-25 | Disposition: A | Discharge status: Home / Self Care | Source: Ambulatory Visit | Attending: Nurse Practitioner

## 2024-05-25 VITALS — BP 146/71 | HR 77 | Temp 98.1°F | Resp 18

## 2024-05-25 DIAGNOSIS — R0989 Other specified symptoms and signs involving the circulatory and respiratory systems: Secondary | ICD-10-CM | POA: Diagnosis not present

## 2024-05-25 DIAGNOSIS — J0140 Acute pansinusitis, unspecified: Secondary | ICD-10-CM | POA: Diagnosis not present

## 2024-05-25 LAB — POC SOFIA SARS ANTIGEN FIA: SARS Coronavirus 2 Ag: NEGATIVE

## 2024-05-25 MED ORDER — AZELASTINE HCL 0.1 % NA SOLN: 2.0000 | Freq: Two times a day (BID) | NASAL | 0 refills | Status: DC

## 2024-05-25 MED ORDER — AMOXICILLIN-POT CLAVULANATE 875-125 MG PO TABS: 1.0000 | ORAL_TABLET | Freq: Two times a day (BID) | ORAL | 0 refills | Status: DC

## 2024-05-25 NOTE — Discharge Instructions (Signed)
 The COVID test was negative. Take medication as directed. Continue Claritin  daily Increase fluids and get plenty of rest. May take over-the-counter Tylenol  as needed for pain, fever, or general discomfort. Continue normal saline nasal spray to help with nasal congestion throughout the day. If your symptoms fail to improve with this treatment, recommend follow-up with your primary care physician or in this clinic for further evaluation. Follow-up as needed.

## 2024-05-25 NOTE — ED Triage Notes (Signed)
 Headache x 4 days with sinus pressure and bilateral ears popping.  Has been using mucinex and flonase  without relief.

## 2024-05-25 NOTE — ED Provider Notes (Signed)
 [facility]    CSN: [id number] Arrival date & time: 05/25/24  1038      History   Chief Complaint Chief Complaint  Patient presents with   Headache    I have had a bad sinus headache for 3 days now. The pain is a tight pressure feeling in my sinuses. Both ears are also hurting mildly. - Entered by patient    HPI Bethany King is a 53 y.o. female.   The history is provided by the patient.   Patient presents for a 4-day history of sinus headache, sinus pressure, and bilateral ear fullness.  Patient states that she has a tight pressure feeling in her sinuses. patient denies fever, chills, ear drainage, cough, abdominal pain, nausea, vomiting, diarrhea, or rash.  Patient states that she has been taking Mucinex and Flonase  with minimal relief.  She states that she also took a home COVID test which was negative.  She denies any obvious close sick contacts.  Past Medical History:  Diagnosis Date   Allergic rhinitis    Anxiety    Arthritis    ASCUS of cervix with negative high risk HPV 04/20/2022   04/20/22 repeat in 3 years per ASCCP guidelines, 5 year risk CIN 3+ is 0.27%    Bladder pain    Complication of anesthesia    Costochondritis    hx   DDD (degenerative disc disease), lumbar    Depression    GERD (gastroesophageal reflux disease)    Hepatic hemangioma 2006   stable   History of adenomatous polyp of colon    History of colitis    History of gastritis    History of kidney stones    HTN (hypertension)    Interstitial cystitis    Irritable bowel syndrome (IBS)    Medullary sponge kidney    left   Migraine    Nephrolithiasis    bilateral-- nonobstructive per CT   PONV (postoperative nausea and vomiting)    Psoriatic arthritis (HCC)    Psoriatic arthritis (HCC)    Spinal stenosis    Urgency of urination     Patient Active Problem List   Diagnosis Date Noted   Nephrolithiasis 03/13/2024   Fibroid 05/03/2023   Irritable 04/19/2023   Urinary  incontinence 04/19/2023   OAB (overactive bladder) 04/19/2023   Vaginal spotting 04/19/2023   ASCUS of cervix with negative high risk HPV 04/20/2022   Early satiety 01/06/2022   Hematochezia 01/06/2022   Bloating 01/06/2022   Hemorrhoids 12/09/2021   Rectal itching 12/09/2021   Overactive bladder 09/09/2020   Encounter for screening fecal occult blood testing 04/09/2020   Encounter for well woman exam with routine gynecological exam 04/09/2020   Hormone replacement therapy (HRT) 04/09/2020   Pelvic pain in female 04/05/2020   Urinary urgency 04/05/2020   Vaginal dryness 02/27/2020   Burning with urination 02/27/2020   Vaginal burning 02/27/2020   Chronic interstitial cystitis 02/09/2020   Menopause 04/04/2019   Screening for colorectal cancer 04/04/2019   Encounter for gynecological examination with Papanicolaou smear of cervix 04/04/2019   Routine cervical smear 04/04/2019   Vaginal dryness, menopausal 04/04/2019   Hot flashes 04/04/2019   Psoriatic arthritis (HCC) 12/26/2018   History of colonic polyps 12/26/2018   Pain of upper abdomen 12/26/2018   Abdominal pain 06/23/2014   Acute gastroenteritis 06/23/2014   Back pain with radiation 10/24/2012   Breast mass 10/09/2012   Seasonal allergies 09/22/2012   Hypokalemia 09/22/2012   Dysmenorrhea 08/30/2012  Sciatica 05/29/2012   Sinusitis 03/02/2012   Back pain 02/10/2012   Menorrhagia 12/30/2011   Shoulder pain, left 12/03/2011   Costochondritis 11/17/2011   Shoulder bursitis 11/17/2011   Recurrent UTI 10/09/2011   Migraine headache 10/08/2011   Obesity 09/04/2011   HTN (hypertension) 07/14/2011   Anxiety 07/14/2011   Diarrhea 05/19/2011   Chest pain 04/30/2011   IBS (irritable bowel syndrome) 09/23/2010   Epigastric pain 09/23/2010   Constipation 01/29/2010   Gastroesophageal reflux disease 01/24/2010    Past Surgical History:  Procedure Laterality Date   ANTERIOR CERVICAL DECOMP/DISCECTOMY FUSION  02/26/2014    C5 - C6; fusion with plate   BIOPSY  01/04/2019   Procedure: BIOPSY;  Surgeon: Golda Claudis PENNER, MD;  [facility]: [facility];  Service: Endoscopy;;  gastric polyps   BIOPSY  02/10/2022   Procedure: BIOPSY;  Surgeon: Eartha Angelia Sieving, MD;  [facility]: [facility];  Service: Gastroenterology;;   ORIN MEDIATE RELEASE Bilateral right 04-01-2010/  left 05-30-2010   CHOLECYSTECTOMY N/A 07/14/2013   Procedure: LAPAROSCOPIC CHOLECYSTECTOMY;  Surgeon: Oneil DELENA Budge, MD;  Location: [facility];  Service: General;  Laterality: N/A;   COLONOSCOPY  10-13-2010   COLONOSCOPY N/A 01/04/2019   Procedure: COLONOSCOPY;  Surgeon: Golda Claudis PENNER, MD;  [facility]: [facility];  Service: Endoscopy;  Laterality: N/A;   COLONOSCOPY WITH PROPOFOL  N/A 02/10/2022   Procedure: COLONOSCOPY WITH PROPOFOL ;  Surgeon: Eartha Angelia Sieving, MD;  [facility]: [facility];  Service: Gastroenterology;  Laterality: N/A;  915 ASA 3   CYSTO WITH HYDRODISTENSION N/A 04/02/2015   Procedure: CYSTOSCOPY/HYDRODISTENSION, BLADDER BIOPSY WITH FULGURATION;  Surgeon: Norleen Seltzer, MD;  Location: [facility];  Service: Urology;  Laterality: N/A;   CYSTO WITH HYDRODISTENSION N/A 03/25/2018   Procedure: CYSTOSCOPY/HYDRODISTENSION, INSTILL MARCAIN AND PYRIDIUM ;  Surgeon: Seltzer Norleen, MD;  Location: [facility];  Service: Urology;  Laterality: N/A;   CYSTO WITH HYDRODISTENSION N/A 01/15/2020   Procedure: CYSTOSCOPY/HYDRODISTENSION;  Surgeon: Sherrilee Belvie CROME, MD;  Location: [facility];  Service: Urology;  Laterality: N/A;   CYSTOSCOPY W/ URETERAL STENT PLACEMENT Bilateral 04/08/2016   Procedure: CYSTOSCOPY WITH BILATERAL RETROGRADE PYELOGRAM, BILATERAL URETERAL STENT PLACEMENT;  Surgeon: Belvie CROME Sherrilee, MD;  Location: [facility];  Service: Urology;  Laterality: Bilateral;   CYSTOSCOPY W/ URETERAL STENT PLACEMENT Left 04/15/2016   Procedure: CYSTOSCOPY WITH RETROGRADE PYELOGRAM/URETERAL STENT PLACEMENT;  Surgeon: Belvie CROME Sherrilee,  MD;  Location: [facility];  Service: Urology;  Laterality: Left;   CYSTOSCOPY WITH BIOPSY N/A 01/15/2020   Procedure: CYSTOSCOPY WITH BLADDER BIOPSY AND FULGERATION;  Surgeon: Sherrilee Belvie CROME, MD;  Location: [facility];  Service: Urology;  Laterality: N/A;   CYSTOSCOPY WITH HOLMIUM LASER LITHOTRIPSY Right 04/08/2016   Procedure: CYSTOSCOPY WITH RIGHT RENAL STONE EXTRACTION WITH HOLMIUM LASER LITHOTRIPSY;  Surgeon: Belvie CROME Sherrilee, MD;  Location: [facility];  Service: Urology;  Laterality: Right;   CYSTOSCOPY WITH HOLMIUM LASER LITHOTRIPSY Left 04/15/2016   Procedure: CYSTOSCOPY WITH HOLMIUM LASER LITHOTRIPSY;  Surgeon: Belvie CROME Sherrilee, MD;  Location: [facility];  Service: Urology;  Laterality: Left;   CYSTOSCOPY WITH RETROGRADE PYELOGRAM, URETEROSCOPY AND STENT PLACEMENT Left 07/05/2017   Procedure: CYSTOSCOPY WITH RETROGRADE PYELOGRAM, URETEROSCOPY AND STENT PLACEMENT;  Surgeon: Sherrilee Belvie CROME, MD;  Location: [facility];  Service: Urology;  Laterality: Left;   CYSTOSCOPY/URETEROSCOPY/HOLMIUM LASER/STENT PLACEMENT Left 03/13/2024   Procedure: CYSTOSCOPY/URETEROSCOPY/HOLMIUM LASER/STENT PLACEMENT;  Surgeon: Sherrilee Belvie CROME, MD;  [facility]: [facility];  Service: Urology;  Laterality: Left;   DIAGNOSTIC LAPAROSCOPY  DILATION AND CURETTAGE OF UTERUS     DILITATION & CURRETTAGE/HYSTROSCOPY WITH THERMACHOICE ABLATION N/A 09/07/2012   Procedure: DILATATION & CURETTAGE/HYSTEROSCOPY WITH THERMACHOICE ABLATION;  Surgeon: Vonn VEAR Inch, MD;  Location: [facility];  Service: Gynecology;  Laterality: N/A;   ESOPHAGOGASTRODUODENOSCOPY  10/14/10   ESOPHAGOGASTRODUODENOSCOPY N/A 01/04/2019   Procedure: ESOPHAGOGASTRODUODENOSCOPY (EGD);  Surgeon: Golda Claudis PENNER, MD;  [facility];  Service: Endoscopy;  Laterality: N/A;  9:30   ESOPHAGOGASTRODUODENOSCOPY (EGD) WITH PROPOFOL  N/A 02/10/2022   Procedure: ESOPHAGOGASTRODUODENOSCOPY (EGD) WITH PROPOFOL ;  Surgeon: Eartha Angelia Sieving, MD;  [facility];   Service: Gastroenterology;  Laterality: N/A;   EXTRACORPOREAL SHOCK WAVE LITHOTRIPSY  multiple   EXTRACORPOREAL SHOCK WAVE LITHOTRIPSY Left 01/04/2024   Procedure: LITHOTRIPSY, ESWL;  Surgeon: Sherrilee Belvie CROME, MD;  Location: [facility];  Service: Urology;  Laterality: Left;   HOLMIUM LASER APPLICATION Left 07/05/2017   Procedure: HOLMIUM LASER APPLICATION;  Surgeon: Sherrilee Belvie CROME, MD;  [facility]: [facility];  Service: Urology;  Laterality: Left;   HOLMIUM LASER APPLICATION Left 03/13/2024   Procedure: HOLMIUM LASER APPLICATION;  Surgeon: Sherrilee Belvie CROME, MD;  Location: [facility];  Service: Urology;  Laterality: Left;   PERCUTANEOUS NEPHROSTOLITHOTOMY  2003   PLANTAR FASCIA SURGERY Bilateral right 2008//  left 2010   POLYPECTOMY  01/04/2019   Procedure: POLYPECTOMY;  Surgeon: Golda Claudis PENNER, MD;  [facility];  Service: Endoscopy;;  colon   STONE EXTRACTION WITH BASKET Left 04/15/2016   Procedure: STONE EXTRACTION WITH BASKET;  Surgeon: Belvie CROME Sherrilee, MD;  Location: [facility];  Service: Urology;  Laterality: Left;    OB History     Gravida  0   Para      Term      Preterm      AB      Living  0      SAB      IAB      Ectopic      Multiple      Live Births               Home Medications    Prior to Admission medications  Medication Sig Start Date End Date Taking? Authorizing Provider  albuterol  (VENTOLIN  HFA) 108 (90 Base) MCG/ACT inhaler Inhale 1-2 puffs into the lungs every 6 (six) hours as needed for shortness of breath or wheezing.    [provider]  ALPRAZolam  (XANAX ) 0.5 MG tablet Take 0.5 mg by mouth at bedtime. 11/10/19   [provider]  DULoxetine (CYMBALTA) 60 MG capsule Take 60 mg by mouth 2 (two) times daily.    [provider]  estradiol  (ESTRACE ) 1 MG tablet TAKE 1 TABLET(1 MG) BY MOUTH DAILY 03/27/24   Signa Nest A, NP  fluticasone  (FLONASE ) 50 MCG/ACT nasal spray Place 1 spray into both nostrils in  the morning.    [provider]  loratadine  (CLARITIN ) 10 MG tablet Take 10 mg by mouth in the morning.    [provider]  Multiple Vitamin (MULTIVITAMIN WITH MINERALS) TABS tablet Take 1 tablet by mouth daily.    [provider]  pantoprazole  (PROTONIX ) 40 MG tablet Take 40 mg by mouth in the morning.    [provider]  pentosan polysulfate (ELMIRON ) 100 MG capsule Take 1 capsule (100 mg total) by mouth 2 (two) times daily. 03/27/24   McKenzie, Belvie CROME, MD  potassium chloride  (KLOR-CON ) 10 MEQ tablet Take 20 mEq by mouth 2 (two) times daily.  10/25/23   [provider]  progesterone  (PROMETRIUM ) 200 MG capsule TAKE 1 CAPSULE(200 MG) BY MOUTH AT BEDTIME 03/27/24   Signa Nest A, NP  SKYRIZI PEN 150 MG/ML pen Inject 150 mg into the skin every 3 (three) months. 12/31/23   [provider]  triamterene-hydrochlorothiazide  (DYAZIDE) 37.5-25 MG capsule Take 1 capsule by mouth in the morning. 01/09/18   [provider]  Adalimumab (HUMIRA PEN) 40 MG/0.4ML PNKT Inject 40 mg into the skin every 14 (fourteen) days. Twice a month   08/09/19  [provider]    Family History Family History  Problem Relation Age of Onset   Irritable bowel syndrome Mother    Scleroderma Mother    Rheum arthritis Mother    Asthma Mother    Melanoma Father    Cancer Father        Melanoma   Colon cancer Cousin 1       second cousin Harles Lovelace)   Cancer Maternal Grandmother        Ovarian   Diabetes Maternal Grandmother    Thyroid  disease Maternal Aunt     Social History Social History[1]   Allergies   Hydromorphone , Prednisone , Tramadol, and Sulfa antibiotics   Review of Systems Review of Systems Per HPI  Physical Exam Triage Vital Signs ED Triage Vitals  Encounter Vitals Group     BP 05/25/24 1047 (!) 146/71     Girls Systolic BP Percentile --      Girls Diastolic BP Percentile --      Boys Systolic BP Percentile --       Boys Diastolic BP Percentile --      Pulse Rate 05/25/24 1047 77     Resp 05/25/24 1047 18     Temp 05/25/24 1047 98.1 F (36.7 C)     Temp Source 05/25/24 1047 Oral     SpO2 05/25/24 1047 97 %     Weight --      Height --      Head Circumference --      Peak Flow --      Pain Score 05/25/24 1048 6     Pain Loc --      Pain Education --      Exclude from Growth Chart --    No data found.  Updated Vital Signs BP (!) 146/71 (BP Location: Right Arm)   Pulse 77   Temp 98.1 F (36.7 C) (Oral)   Resp 18   SpO2 97%   Visual Acuity Right Eye Distance:   Left Eye Distance:   Bilateral Distance:    Right Eye Near:   Left Eye Near:    Bilateral Near:     Physical Exam Vitals and nursing note reviewed.  Constitutional:      General: She is not in acute distress.    Appearance: She is well-developed.  HENT:     Head: Normocephalic.     Right Ear: Tympanic membrane, ear canal and external ear normal.     Left Ear: Tympanic membrane, ear canal and external ear normal.     Nose: Congestion present.     Right Turbinates: Enlarged and swollen.     Left Turbinates: Enlarged and swollen.     Right Sinus: Maxillary sinus tenderness and frontal sinus tenderness present.     Left Sinus: Maxillary sinus tenderness and frontal sinus tenderness present.     Mouth/Throat:     Mouth: Mucous membranes are moist.  Eyes:  Extraocular Movements: Extraocular movements intact.     Conjunctiva/sclera: Conjunctivae normal.     Pupils: Pupils are equal, round, and reactive to light.  Cardiovascular:     Rate and Rhythm: Normal rate and regular rhythm.     Pulses: Normal pulses.     Heart sounds: Normal heart sounds.  Pulmonary:     Effort: Pulmonary effort is normal. No respiratory distress.     Breath sounds: Normal breath sounds. No stridor. No wheezing, rhonchi or rales.  Abdominal:     General: Bowel sounds are normal.     Palpations: Abdomen is soft.  Musculoskeletal:      Cervical back: Normal range of motion.  Skin:    General: Skin is warm and dry.  Neurological:     General: No focal deficit present.     Mental Status: She is alert and oriented to person, place, and time.  Psychiatric:        Mood and Affect: Mood normal.        Behavior: Behavior normal.      UC Treatments / Results  Labs (all labs ordered are listed, but only abnormal results are displayed) Labs Reviewed  POC SOFIA SARS ANTIGEN FIA    EKG   Radiology No results found.  Procedures Procedures (including critical care time)  Medications Ordered in UC Medications - No data to display  Initial Impression / Assessment and Plan / UC Course  I have reviewed the triage vital signs and the nursing notes.  Pertinent labs & imaging results that were available during my care of the patient were reviewed by me and considered in my medical decision making (see chart for details).  The COVID test was negative.  On exam, patient with both maxillary and frontal sinus tenderness.  She also complains of moderate headache.  Given her current presentation and symptoms, concern for bacterial etiology.  Will cover for acute pansinusitis with Augmentin  875/125 mg.  Azelastine  nasal spray also prescribed for sinus pressure and tenderness.  Supportive care recommendations were provided and discussed with the patient to include fluids, rest, over-the-counter analgesics, continuing normal saline nasal spray, and use of a humidifier.  Discussed indications with the patient regarding follow-up.  Patient was in agreement with this plan of care and verbalizes understanding.  All questions were answered.  Patient stable for discharge.  Final Clinical Impressions(s) / UC Diagnoses   Final diagnoses:  Symptoms of upper respiratory infection (URI)   Discharge Instructions   None    ED Prescriptions   None    PDMP not reviewed this encounter.     [1]  Social History Tobacco Use   Smoking  status: Never    Passive exposure: Never   Smokeless tobacco: Never  Vaping Use   Vaping status: Never Used  Substance Use Topics   Alcohol use: No   Drug use: No     Gilmer Etta PARAS, NP 05/25/24 2024

## 2024-05-26 DIAGNOSIS — E782 Mixed hyperlipidemia: Secondary | ICD-10-CM | POA: Diagnosis not present

## 2024-05-26 DIAGNOSIS — I10 Essential (primary) hypertension: Secondary | ICD-10-CM | POA: Diagnosis not present

## 2024-05-26 DIAGNOSIS — F32A Depression, unspecified: Secondary | ICD-10-CM | POA: Diagnosis not present

## 2024-05-26 DIAGNOSIS — K219 Gastro-esophageal reflux disease without esophagitis: Secondary | ICD-10-CM | POA: Diagnosis not present

## 2024-05-26 DIAGNOSIS — F419 Anxiety disorder, unspecified: Secondary | ICD-10-CM | POA: Diagnosis not present

## 2024-06-13 NOTE — Progress Notes (Signed)
 "  Office Visit Note  Patient: Bethany King             Date of Birth: 29-Sep-1970           MRN: [id number]             PCP: Gladis Lauraine BRAVO, NP Referring: Bertell Satterfield, MD Visit Date: 06/27/2024 Occupation: Data Unavailable  Subjective:  Pain in multiple joints  History of Present Illness: Bethany King is a 53 y.o. female seen for the evaluation of psoriatic arthritis and psoriasis. Patient's symptoms started in 2019 with 8 months history of pain and swelling in both hands, ankles, and rash consistent with psoriasis.  Patient was referred to Dr. Curt.  At the time she had negative ANA, negative RF, negative anti-CCP and negative HLA-B27.  She was started on methotrexate but methotrexate was stopped stopped in 2020 due to elevated LFTs nausea.  She was switched to Humira which was discontinued due to loss of efficacy.next drug was Cosentyx which also lost efficacy, Enbrel was started and discontinued due to loss of efficacy, Tremfya was tried but discontinued due to loss of  efficacy.  Skyrizi was started about 2 to 3 years which she has been taking on a regular basis. Patient states for the last 6 months she has been having flare prior to her next dose of Skyrizi.  She states she was given a prednisone  taper on November 28 for 2 weeks taper.  She states that helped.  Her next Skyrizi dose is due next week.  Patient states she also tried leflunomide for about 4 months but had to discontinue due to GI side effects.  She states she has underlying disc disease in her cervical and lumbar spine.  She continues to have neck and lower back pain.  She also has pain and discomfort in her shoulders, wrist, hands, hips, knees, ankles and her feet.  She notices swelling in her hands.  She states she had surgery for plantar fasciitis in the past.  She has never had Achilles tendinitis.  She also notices redness in her eyes and was evaluated by ophthalmologist.  She did not have uveitis.  She has not had  a psoriasis flare in a long time. She is on disability due to her back.  She works as a engineer, agricultural in scientist, water quality.  She enjoys gardening and walking.  She is left-handed, divorced, gravida 0.  There is no history of DVTs.  She does not drink any alcohol and never been a smoker.  She is postmenopausal   Activities of Daily Living:  Patient reports morning stiffness for 2 hours.   Patient Reports nocturnal pain.  Difficulty dressing/grooming: Denies Difficulty climbing stairs: Reports Difficulty getting out of chair: Denies Difficulty using hands for taps, buttons, cutlery, and/or writing: Reports  Review of Systems  Constitutional:  Positive for fatigue.  HENT:  Positive for mouth sores, mouth dryness and nose dryness.        Nose sores  Eyes:  Positive for dryness.  Respiratory:  Negative for shortness of breath.   Cardiovascular:  Negative for chest pain and palpitations.  Gastrointestinal:  Positive for constipation. Negative for blood in stool and diarrhea.  Endocrine: Positive for increased urination.  Genitourinary:  Positive for painful urination and involuntary urination.  Musculoskeletal:  Positive for joint pain, gait problem, joint pain, joint swelling, myalgias, morning stiffness, muscle tenderness and myalgias. Negative for muscle weakness.  Skin:  Positive for color change. Negative for  rash, hair loss and sensitivity to sunlight.  Allergic/Immunologic: Positive for susceptible to infections.  Neurological:  Negative for dizziness, numbness and headaches.  Hematological:  Negative for swollen glands.  Psychiatric/Behavioral:  Positive for depressed mood and sleep disturbance. The patient is nervous/anxious.     PMFS History:  Patient Active Problem List   Diagnosis Date Noted   Nephrolithiasis 03/13/2024   Fibroid 05/03/2023   Irritable 04/19/2023   Urinary incontinence 04/19/2023   OAB (overactive bladder) 04/19/2023   Vaginal spotting 04/19/2023   ASCUS of  cervix with negative high risk HPV 04/20/2022   Early satiety 01/06/2022   Hematochezia 01/06/2022   Bloating 01/06/2022   Hemorrhoids 12/09/2021   Rectal itching 12/09/2021   Overactive bladder 09/09/2020   Encounter for screening fecal occult blood testing 04/09/2020   Encounter for well woman exam with routine gynecological exam 04/09/2020   Hormone replacement therapy (HRT) 04/09/2020   Pelvic pain in female 04/05/2020   Urinary urgency 04/05/2020   Vaginal dryness 02/27/2020   Burning with urination 02/27/2020   Vaginal burning 02/27/2020   Chronic interstitial cystitis 02/09/2020   Menopause 04/04/2019   Screening for colorectal cancer 04/04/2019   Encounter for gynecological examination with Papanicolaou smear of cervix 04/04/2019   Routine cervical smear 04/04/2019   Vaginal dryness, menopausal 04/04/2019   Hot flashes 04/04/2019   Psoriatic arthritis (HCC) 12/26/2018   History of colonic polyps 12/26/2018   Pain of upper abdomen 12/26/2018   Abdominal pain 06/23/2014   Acute gastroenteritis 06/23/2014   Back pain with radiation 10/24/2012   Breast mass 10/09/2012   Seasonal allergies 09/22/2012   Hypokalemia 09/22/2012   Dysmenorrhea 08/30/2012   Sciatica 05/29/2012   Sinusitis 03/02/2012   Back pain 02/10/2012   Menorrhagia 12/30/2011   Shoulder pain, left 12/03/2011   Costochondritis 11/17/2011   Shoulder bursitis 11/17/2011   Recurrent UTI 10/09/2011   Migraine headache 10/08/2011   Obesity 09/04/2011   HTN (hypertension) 07/14/2011   Anxiety 07/14/2011   Diarrhea 05/19/2011   Chest pain 04/30/2011   IBS (irritable bowel syndrome) 09/23/2010   Epigastric pain 09/23/2010   Constipation 01/29/2010   Gastroesophageal reflux disease 01/24/2010    Past Medical History:  Diagnosis Date   Allergic rhinitis    Anxiety    Arthritis    ASCUS of cervix with negative high risk HPV 04/20/2022   04/20/22 repeat in 3 years per ASCCP guidelines, 5 year risk CIN 3+  is 0.27%    Bladder pain    Complication of anesthesia    Costochondritis    hx   DDD (degenerative disc disease), lumbar    Depression    GERD (gastroesophageal reflux disease)    Hepatic hemangioma 2006   stable   History of adenomatous polyp of colon    History of colitis    History of gastritis    History of kidney stones    HTN (hypertension)    Interstitial cystitis    Irritable bowel syndrome (IBS)    Medullary sponge kidney    left   Migraine    Nephrolithiasis    bilateral-- nonobstructive per CT   PONV (postoperative nausea and vomiting)    Psoriatic arthritis (HCC)    Psoriatic arthritis (HCC)    Spinal stenosis    Urgency of urination     Family History  Problem Relation Age of Onset   Irritable bowel syndrome Mother    Scleroderma Mother    Rheum arthritis Mother    Asthma Mother  Lupus Mother    Raynaud syndrome Mother    Melanoma Father    Cancer Father        Melanoma   Sjogren's syndrome Maternal Aunt    Hyperthyroidism Maternal Aunt    Cancer Maternal Grandmother        Ovarian   Diabetes Maternal Grandmother    Colon cancer Cousin 45       second cousin Harles Ana)   Past Surgical History:  Procedure Laterality Date   ANTERIOR CERVICAL DECOMP/DISCECTOMY FUSION  02/26/2014   C5 - C6; fusion with plate   BIOPSY  01/04/2019   Procedure: BIOPSY;  Surgeon: Golda Claudis PENNER, MD;  [facility];  Service: Endoscopy;;  gastric polyps   BIOPSY  02/10/2022   Procedure: BIOPSY;  Surgeon: Eartha Angelia Sieving, MD;  [facility]: [facility];  Service: Gastroenterology;;   ORIN MEDIATE RELEASE Bilateral right 04-01-2010/  left 05-30-2010   CHOLECYSTECTOMY N/A 07/14/2013   Procedure: LAPAROSCOPIC CHOLECYSTECTOMY;  Surgeon: Oneil DELENA Budge, MD;  Location: [facility];  Service: General;  Laterality: N/A;   COLONOSCOPY  10-13-2010   COLONOSCOPY N/A 01/04/2019   Procedure: COLONOSCOPY;  Surgeon: Golda Claudis PENNER, MD;  [facility]: [facility];   Service: Endoscopy;  Laterality: N/A;   COLONOSCOPY WITH PROPOFOL  N/A 02/10/2022   Procedure: COLONOSCOPY WITH PROPOFOL ;  Surgeon: Eartha Angelia Sieving, MD;  [facility];  Service: Gastroenterology;  Laterality: N/A;  915 ASA 3   CYSTO WITH HYDRODISTENSION N/A 04/02/2015   Procedure: CYSTOSCOPY/HYDRODISTENSION, BLADDER BIOPSY WITH FULGURATION;  Surgeon: Norleen Seltzer, MD;  Location: [facility];  Service: Urology;  Laterality: N/A;   CYSTO WITH HYDRODISTENSION N/A 03/25/2018   Procedure: CYSTOSCOPY/HYDRODISTENSION, INSTILL MARCAIN AND PYRIDIUM ;  Surgeon: Seltzer Norleen, MD;  Location: [facility];  Service: Urology;  Laterality: N/A;   CYSTO WITH HYDRODISTENSION N/A 01/15/2020   Procedure: CYSTOSCOPY/HYDRODISTENSION;  Surgeon: Sherrilee Belvie CROME, MD;  Location: [facility];  Service: Urology;  Laterality: N/A;   CYSTOSCOPY W/ URETERAL STENT PLACEMENT Bilateral 04/08/2016   Procedure: CYSTOSCOPY WITH BILATERAL RETROGRADE PYELOGRAM, BILATERAL URETERAL STENT PLACEMENT;  Surgeon: Belvie CROME Sherrilee, MD;  Location: [facility];  Service: Urology;  Laterality: Bilateral;   CYSTOSCOPY W/ URETERAL STENT PLACEMENT Left 04/15/2016   Procedure: CYSTOSCOPY WITH RETROGRADE PYELOGRAM/URETERAL STENT PLACEMENT;  Surgeon: Belvie CROME Sherrilee, MD;  Location: [facility];  Service: Urology;  Laterality: Left;   CYSTOSCOPY WITH BIOPSY N/A 01/15/2020   Procedure: CYSTOSCOPY WITH BLADDER BIOPSY AND FULGERATION;  Surgeon: Sherrilee Belvie CROME, MD;  Location: [facility];  Service: Urology;  Laterality: N/A;   CYSTOSCOPY WITH HOLMIUM LASER LITHOTRIPSY Right 04/08/2016   Procedure: CYSTOSCOPY WITH RIGHT RENAL STONE EXTRACTION WITH HOLMIUM LASER LITHOTRIPSY;  Surgeon: Belvie CROME Sherrilee, MD;  [facility]: [facility];  Service: Urology;  Laterality: Right;   CYSTOSCOPY WITH HOLMIUM LASER LITHOTRIPSY Left 04/15/2016   Procedure: CYSTOSCOPY WITH HOLMIUM LASER LITHOTRIPSY;  Surgeon: Belvie CROME Sherrilee, MD;  [facility];  Service:  Urology;  Laterality: Left;   CYSTOSCOPY WITH RETROGRADE PYELOGRAM, URETEROSCOPY AND STENT PLACEMENT Left 07/05/2017   Procedure: CYSTOSCOPY WITH RETROGRADE PYELOGRAM, URETEROSCOPY AND STENT PLACEMENT;  Surgeon: Sherrilee Belvie CROME, MD;  [facility]: [facility];  Service: Urology;  Laterality: Left;   CYSTOSCOPY/URETEROSCOPY/HOLMIUM LASER/STENT PLACEMENT Left 03/13/2024   Procedure: CYSTOSCOPY/URETEROSCOPY/HOLMIUM LASER/STENT PLACEMENT;  Surgeon: Sherrilee Belvie CROME, MD;  [facility]: [facility];  Service: Urology;  Laterality: Left;   DIAGNOSTIC LAPAROSCOPY     DILATION AND CURETTAGE OF UTERUS  DILITATION & CURRETTAGE/HYSTROSCOPY WITH THERMACHOICE ABLATION N/A 09/07/2012   Procedure: DILATATION & CURETTAGE/HYSTEROSCOPY WITH THERMACHOICE ABLATION;  Surgeon: Vonn VEAR Inch, MD;  Location: [facility];  Service: Gynecology;  Laterality: N/A;   ESOPHAGOGASTRODUODENOSCOPY  10/14/10   ESOPHAGOGASTRODUODENOSCOPY N/A 01/04/2019   Procedure: ESOPHAGOGASTRODUODENOSCOPY (EGD);  Surgeon: Golda Claudis PENNER, MD;  [facility];  Service: Endoscopy;  Laterality: N/A;  9:30   ESOPHAGOGASTRODUODENOSCOPY (EGD) WITH PROPOFOL  N/A 02/10/2022   Procedure: ESOPHAGOGASTRODUODENOSCOPY (EGD) WITH PROPOFOL ;  Surgeon: Eartha Angelia Sieving, MD;  [facility];  Service: Gastroenterology;  Laterality: N/A;   EXTRACORPOREAL SHOCK WAVE LITHOTRIPSY  multiple   EXTRACORPOREAL SHOCK WAVE LITHOTRIPSY Left 01/04/2024   Procedure: LITHOTRIPSY, ESWL;  Surgeon: Sherrilee Belvie CROME, MD;  [facility]: [facility];  Service: Urology;  Laterality: Left;   HOLMIUM LASER APPLICATION Left 07/05/2017   Procedure: HOLMIUM LASER APPLICATION;  Surgeon: Sherrilee Belvie CROME, MD;  [facility];  Service: Urology;  Laterality: Left;   HOLMIUM LASER APPLICATION Left 03/13/2024   Procedure: HOLMIUM LASER APPLICATION;  Surgeon: Sherrilee Belvie CROME, MD;  [facility];  Service: Urology;  Laterality: Left;   PERCUTANEOUS NEPHROSTOLITHOTOMY  2003   PLANTAR  FASCIA SURGERY Bilateral right 2008//  left 2010   POLYPECTOMY  01/04/2019   Procedure: POLYPECTOMY;  Surgeon: Golda Claudis PENNER, MD;  [facility];  Service: Endoscopy;;  colon   STONE EXTRACTION WITH BASKET Left 04/15/2016   Procedure: STONE EXTRACTION WITH BASKET;  Surgeon: Belvie CROME Sherrilee, MD;  Location: [facility];  Service: Urology;  Laterality: Left;   Social History[1] Social History   Social History Narrative   Not on file     Immunization History  Administered Date(s) Administered   Moderna Sars-Covid-2 Vaccination 09/01/2019, 10/03/2019   Rabies, IM 03/31/2021, 04/03/2021, 04/07/2021, 04/15/2021   Tdap 10/08/2011, 03/31/2021     Objective: Vital Signs: BP 131/80 (BP Location: Right Arm, Patient Position: Sitting, Cuff Size: Normal)   Pulse 72   Temp 98.8 F (37.1 C)   Resp 16   Ht 5' 2.5 (1.588 m)   Wt 159 lb 12.8 oz (72.5 kg)   BMI 28.76 kg/m    Physical Exam Vitals and nursing note reviewed.  Constitutional:      Appearance: She is well-developed.  HENT:     Head: Normocephalic and atraumatic.  Eyes:     Conjunctiva/sclera: Conjunctivae normal.  Cardiovascular:     Rate and Rhythm: Normal rate and regular rhythm.     Heart sounds: Normal heart sounds.  Pulmonary:     Effort: Pulmonary effort is normal.     Breath sounds: Normal breath sounds.  Abdominal:     General: Bowel sounds are normal.     Palpations: Abdomen is soft.  Musculoskeletal:     Cervical back: Normal range of motion.  Lymphadenopathy:     Cervical: No cervical adenopathy.  Skin:    General: Skin is warm and dry.     Capillary Refill: Capillary refill takes less than 2 seconds.  Neurological:     Mental Status: She is alert and oriented to person, place, and time.  Psychiatric:        Behavior: Behavior normal.      Musculoskeletal Exam: Patient had very limited range of motion of the cervical spine with discomfort.  She had limited range of motion of her lumbar  spine.  There was no point tenderness over thoracic or lumbar spine.  She had mild tenderness over her SI joints and  gluteal region.  Shoulder joints, elbow joints, wrist joints, MCPs, PIPs and DIPs were in good range of motion with no synovitis.  Bilateral PIP and DIP thickening.  Hip joints and knee joints were in good range of motion without any warmth swelling or effusion.  There was no tenderness over ankles or MTPs.  No plantar fasciitis or Achilles tendinitis was noted.  She had generalized hyperalgesia and positive tender points.  CDAI Exam: CDAI Score: -- Patient Global: --; Provider Global: -- Swollen: --; Tender: -- Joint Exam 06/27/2024   No joint exam has been documented for this visit   There is currently no information documented on the homunculus. Go to the Rheumatology activity and complete the homunculus joint exam.  Investigation: No additional findings.  Imaging: No results found.  Recent Labs: Lab Results  Component Value Date   WBC 7.0 03/21/2018   HGB 10.9 (L) 03/21/2018   PLT 330 03/21/2018   NA 136 03/08/2024   K 3.3 (L) 03/08/2024   CL 98 03/08/2024   CO2 27 03/08/2024   GLUCOSE 88 03/08/2024   BUN 14 03/08/2024   CREATININE 0.75 03/08/2024   BILITOT 2.1 (H) 01/04/2019   ALKPHOS 55 01/04/2019   AST 21 01/04/2019   ALT 18 01/04/2019   PROT 6.8 01/04/2019   ALBUMIN 3.9 01/04/2019   CALCIUM 8.9 03/08/2024   GFRAA >60 01/12/2020   October 06, 2023 WBC 6.7, hemoglobin 12.5, platelets 328, CMP creatinine 0.86, AST 14, ALT 12, LDL 133, B12 465, folate 18, TSH normal,  July 26, 2023 IgG, IgM and IgA normal, B12 normal, folate normal, RNP negative, Smith negative, TSH 5.3, vitamin D 49, ANA 1: 80, ENA (dsDNA, RNP, SCL 70, SSA, SSB, antichromatin, antiribosomal P, Jo 1, centromere) negative  August 12, 2017 CRP 1.9, HLA-B27 negative, ACE 56, CK 71, RF negative, sed rate 15, anti-CCP negative, hepatitis B and hepatitis C nonreactive,  Speciality  Comments: Methotrexate-elevated LFTs,Humira, Cosentyx, Enbrel, Tremfya-lost efficacy Skyrizi 04/24, [place]-GI SEs  Procedures:  No procedures performed Allergies: Hydromorphone , Prednisone , Tramadol, and Sulfa antibiotics   Assessment / Plan:     Visit Diagnoses: Psoriatic arthritis (HCC) - dxd 2019 by Dr. Curt.  Patient states that she has had discomfort in multiple joints and swelling over the years.  She was treated with methotrexate initially which was discontinued due to GI side effects and elevated LFTs.  She was on Humira, Cosentyx, Enbrel, Tremfya but lost efficacy to all these medications over time.  She was also tried on leflunomide which was discontinued after 4 months due to GI side effects.  She has been on Skyrizi for the last 2 years.  She states for the last 6 months she has been having increased flares prior to the next injection of Skyrizi.  She states she was given a prednisone  taper at the end of November for 2 weeks which helped.  She reports pain and swelling in multiple joints today.  On the examination today no synovitis was noted.  She has stiffness with range of motion of her cervical spine and bilateral PIP and DIP thickening in her hands suggestive of osteoarthritis.  She has generalized hyperalgesia and positive tender points.  I did detailed discussion with the patient that currently her symptoms appear to be more suggestive of osteoarthritis and fibromyalgia than a psoriatic flare.  My plan is to observe for now and continue Skyrizi.  Patient was in agreement.  A handout was given and consent was taken on Skyrizi.  Plan:  Sedimentation rate.  Patient denies having any episodes of plantar fasciitis since she had  surgery for plantar fasciitis.  She denies any episodes of Achilles tendinitis or uveitis.  Psoriasis-she has not had any flares of psoriasis in a while.  Counseled patient that Skyrizi is a IL-23 inhibitor.  Counseled patient on purpose, proper use, and adverse  effects of Skyrizi.  Reviewed the most common adverse effects including infection, URTIs, headache, fatigue, tinea infections, and injection site. Counseled patient that Skyrizi should be held for infection and prior to scheduled surgery.  Recommend annual influenza, PCV 15 or PCV20 or Pneumovax 23, and Shingrix as indicated. Advised that live vaccines should be avoided while taking Skyrizi.  Reviewed the importance of regular labs while on Skyrizi therapy.  Will monitor CBC and CMP 1 month after starting and then every 3 months routinely thereafter. Will monitor TB gold annually. Standing orders placed.  Provided patient with medication education material and answered all questions.  Patient voiced understanding.  Patient consented to [place].  Will upload consent into the media tab.  Reviewed storage instructions of Skyrizi.  Patient verbalized understanding.  Will apply for Skyrizi through [employer] and update when we receive a response.  Dose will be Skyrizi 150 mg subcuteanously at week 0, week 4 then every 12 weeks thereafter.  Prescription pending lab results and insurance approval.   High risk medication use - Methotrexate -elevated LFTs, leflunomide-GI side effects, Humira, Cosentyx, Enbrel, Tremfya-lost efficacy, Skyrizi - Plan: CBC with Differential/Platelet, Comprehensive metabolic panel with GFR, Hepatitis B core antibody, IgM, Hepatitis B surface antigen, Hepatitis C antibody, QuantiFERON-TB Gold Plus, Serum protein electrophoresis with reflex, IgG, IgA, IgM  Chronic pain of both shoulders-she complains of some discomfort in her shoulders.  She good range of motion without any discomfort today.  Pain in both hands -she complains of pain and discomfort in her hands and swelling in her hands.  No synovitis was noted.  Plan: XR Hand 2 View Right, XR Hand 2 View Left, x-rays obtained today were suggestive of osteoarthritis.  Rheumatoid factor, Cyclic citrul peptide antibody, IgG  Pain  in both feet -she planes of discomfort in her feet.  No synovitis was noted.  She has not had any episodes of plantar fasciitis since she had surgery.  No Achilles tendinitis was noted.  Plan: XR Foot 2 Views Right, XR Foot 2 Views Left.  X-rays obtained today were suggestive of osteoarthritis.  Chronic SI joint pain -she complains of pain in her hips and SI joints.  She is good range of motion of bilateral hip joints.  Mild tenderness was noted over her SI joints.  Plan: XR Pelvis 1-2 Views.  No SI joint sclerosis or narrowing was noted.  DDD (degenerative disc disease), cervical-she had cervical fusion in the past.  She had very limited range of motion of the cervical spine with discomfort.  Chronic midline low back pain without sciatica-she has chronic pain in her lumbar spine.  Fibromyalgia -her symptoms are suggestive of fibromyalgia.  Detailed counsel regarding fibromyalgia was provided and a handout was given.  She had positive tender points and hyperalgesia.  There is also family history of fibromyalgia in her mother.  She is already on Cymbalta for depression.  Benefits of water  aerobics, swimming and stretching were discussed.  Plan: CK  Other insomnia-she has insomnia due to nocturia.  She has been followed by urologist closely for interstitial cystitis.  Other fatigue she-continues to have some fatigue due to fibromyalgia and  insomnia.  Primary hypertension-blood pressure was normal today.  Screening for diabetes mellitus - Plan: Hemoglobin A1c  Screening for hyperlipidemia -association of hyperlipidemia with psoriatic arthritis was discussed.  Plan: Lipid panel  Other medical problems are listed as follows:  Gastroesophageal reflux disease without esophagitis  History of colonic polyps  Nephrolithiasis-calcium oxalate  Chronic interstitial cystitis  Dysmenorrhea  Seasonal allergies  Anxiety and depression  Family history of systemic lupus erythematosus (SLE) in mother  - And scleroderma - mother  Family history of fibromyalgia-mother  Orders: Orders Placed This Encounter  Procedures   XR Hand 2 View Right   XR Hand 2 View Left   XR Foot 2 Views Right   XR Foot 2 Views Left   XR Pelvis 1-2 Views   CBC with Differential/Platelet   Comprehensive metabolic panel with GFR   CK   Sedimentation rate   Hemoglobin A1c   Lipid panel   Rheumatoid factor   Cyclic citrul peptide antibody, IgG   Hepatitis B core antibody, IgM   Hepatitis B surface antigen   Hepatitis C antibody   QuantiFERON-TB Gold Plus   Serum protein electrophoresis with reflex   IgG, IgA, IgM   No orders of the defined types were placed in this encounter.    Follow-Up Instructions: Return for Psoriatic arthritis.   Maya Nash, MD  Note - This record has been created using Animal nutritionist.  Chart creation errors have been sought, but may not always  have been located. Such creation errors do not reflect on  the standard of medical care.     [1]  Social History Tobacco Use   Smoking status: Never    Passive exposure: Never   Smokeless tobacco: Never  Vaping Use   Vaping status: Never Used  Substance Use Topics   Alcohol use: No   Drug use: No   "

## 2024-06-21 ENCOUNTER — Telehealth: Payer: Self-pay

## 2024-06-21 NOTE — Telephone Encounter (Signed)
 Medication prior authorization request received.  Completed PA request through cover my meds for drug [place] . KEY: [id number]    Approved: Pending

## 2024-06-27 ENCOUNTER — Ambulatory Visit

## 2024-06-27 ENCOUNTER — Encounter: Payer: Self-pay | Admitting: Rheumatology

## 2024-06-27 ENCOUNTER — Telehealth: Payer: Self-pay

## 2024-06-27 ENCOUNTER — Ambulatory Visit: Attending: Rheumatology | Admitting: Rheumatology

## 2024-06-27 VITALS — BP 131/80 | HR 72 | Temp 98.8°F | Resp 16 | Ht 62.5 in | Wt 159.8 lb

## 2024-06-27 DIAGNOSIS — G4709 Other insomnia: Secondary | ICD-10-CM | POA: Diagnosis not present

## 2024-06-27 DIAGNOSIS — M79671 Pain in right foot: Secondary | ICD-10-CM

## 2024-06-27 DIAGNOSIS — L4050 Arthropathic psoriasis, unspecified: Secondary | ICD-10-CM | POA: Diagnosis not present

## 2024-06-27 DIAGNOSIS — M25512 Pain in left shoulder: Secondary | ICD-10-CM

## 2024-06-27 DIAGNOSIS — N3010 Interstitial cystitis (chronic) without hematuria: Secondary | ICD-10-CM

## 2024-06-27 DIAGNOSIS — L409 Psoriasis, unspecified: Secondary | ICD-10-CM | POA: Diagnosis not present

## 2024-06-27 DIAGNOSIS — G8929 Other chronic pain: Secondary | ICD-10-CM | POA: Diagnosis not present

## 2024-06-27 DIAGNOSIS — K219 Gastro-esophageal reflux disease without esophagitis: Secondary | ICD-10-CM

## 2024-06-27 DIAGNOSIS — M79641 Pain in right hand: Secondary | ICD-10-CM

## 2024-06-27 DIAGNOSIS — N200 Calculus of kidney: Secondary | ICD-10-CM

## 2024-06-27 DIAGNOSIS — Z860100 Personal history of colon polyps, unspecified: Secondary | ICD-10-CM

## 2024-06-27 DIAGNOSIS — Z13220 Encounter for screening for lipoid disorders: Secondary | ICD-10-CM

## 2024-06-27 DIAGNOSIS — M25511 Pain in right shoulder: Secondary | ICD-10-CM | POA: Diagnosis not present

## 2024-06-27 DIAGNOSIS — M545 Low back pain, unspecified: Secondary | ICD-10-CM | POA: Diagnosis not present

## 2024-06-27 DIAGNOSIS — Z79899 Other long term (current) drug therapy: Secondary | ICD-10-CM

## 2024-06-27 DIAGNOSIS — F419 Anxiety disorder, unspecified: Secondary | ICD-10-CM

## 2024-06-27 DIAGNOSIS — F32A Depression, unspecified: Secondary | ICD-10-CM

## 2024-06-27 DIAGNOSIS — Z131 Encounter for screening for diabetes mellitus: Secondary | ICD-10-CM

## 2024-06-27 DIAGNOSIS — M533 Sacrococcygeal disorders, not elsewhere classified: Secondary | ICD-10-CM | POA: Diagnosis not present

## 2024-06-27 DIAGNOSIS — K582 Mixed irritable bowel syndrome: Secondary | ICD-10-CM

## 2024-06-27 DIAGNOSIS — R5383 Other fatigue: Secondary | ICD-10-CM

## 2024-06-27 DIAGNOSIS — M79672 Pain in left foot: Secondary | ICD-10-CM

## 2024-06-27 DIAGNOSIS — M797 Fibromyalgia: Secondary | ICD-10-CM | POA: Diagnosis not present

## 2024-06-27 DIAGNOSIS — M79642 Pain in left hand: Secondary | ICD-10-CM

## 2024-06-27 DIAGNOSIS — N946 Dysmenorrhea, unspecified: Secondary | ICD-10-CM

## 2024-06-27 DIAGNOSIS — K648 Other hemorrhoids: Secondary | ICD-10-CM

## 2024-06-27 DIAGNOSIS — M5030 Other cervical disc degeneration, unspecified cervical region: Secondary | ICD-10-CM

## 2024-06-27 DIAGNOSIS — D219 Benign neoplasm of connective and other soft tissue, unspecified: Secondary | ICD-10-CM

## 2024-06-27 DIAGNOSIS — I10 Essential (primary) hypertension: Secondary | ICD-10-CM

## 2024-06-27 DIAGNOSIS — Z87891 Personal history of nicotine dependence: Secondary | ICD-10-CM

## 2024-06-27 DIAGNOSIS — J302 Other seasonal allergic rhinitis: Secondary | ICD-10-CM

## 2024-06-27 DIAGNOSIS — Z8269 Family history of other diseases of the musculoskeletal system and connective tissue: Secondary | ICD-10-CM

## 2024-06-27 NOTE — Patient Instructions (Signed)
 Risankizumab Injection What is this medication? RISANKIZUMAB (RIS an KIZ ue mab) treats autoimmune conditions, such as psoriasis, arthritis, Crohn disease, and ulcerative colitis. It works by slowing down an overactive immune system.  It is a monoclonal antibody. This medicine may be used for other purposes; ask your health care provider or pharmacist if you have questions. COMMON BRAND NAME(S): Skyrizi What should I tell my care team before I take this medication? They need to know if you have any of these conditions: Hepatic disease Immune system problems Infection, such as tuberculosis (TB), bacterial, fungal or viral infections Recent or upcoming vaccine An unusual or allergic reaction to risankizumab, other medications, foods, dyes, or preservatives Pregnant or trying to get pregnant Breast-feeding How should I use this medication? This medication is injected into a vein or under the skin. It is given by your care team in a hospital or clinic setting. It may also be given at home. If you get this medication at home, you will be taught how to prepare and give it. Use exactly as directed. Take it as directed on the prescription label. Keep taking it unless your care team tells you to stop. If you use a pen, be sure to take off the outer needle cover before using the dose. It is important that you put your used needles and syringes in a special sharps container. Do not put them in a trash can. If you do not have a sharps container, call your pharmacist or care team to get one. A special MedGuide will be given to you by the pharmacist with each prescription and refill. Be sure to read this information carefully each time. This medication comes with INSTRUCTIONS FOR USE. Ask your pharmacist for directions on how to use this medication. Read the information carefully. Talk to your pharmacist or care team if you have questions. Talk to your care team about the use of this medication in children.  Special care may be needed. Overdosage: If you think you have taken too much of this medicine contact a poison control center or emergency room at once. NOTE: This medicine is only for you. Do not share this medicine with others. What if I miss a dose? It is important not to miss any doses. Talk to your care team about what to do if you miss a dose. What may interact with this medication? Do not take this medication with any of the following: Live vaccines This list may not describe all possible interactions. Give your health care provider a list of all the medicines, herbs, non-prescription drugs, or dietary supplements you use. Also tell them if you smoke, drink alcohol, or use illegal drugs. Some items may interact with your medicine. What should I watch for while using this medication? Visit your care team for regular checks on your progress. Tell your care team if your symptoms do not start to get better or if they get worse. You will be tested for tuberculosis (TB) before you start this medication. If your care team prescribes any medication for TB, you should start taking the TB medication before starting this medication. Make sure to finish the full course of TB medication. This medication may increase your risk of getting an infection. Call your care team for advice if you get a fever, chills, sore throat, or other symptoms of a cold or flu. Do not treat yourself. Try to avoid being around people who are sick. This medication can decrease the response to a vaccine. If you  need to get vaccinated, tell your care team if you have received this medication. Extra booster doses may be needed. Talk to your care team to see if a different vaccination schedule is needed. What side effects may I notice from receiving this medication? Side effects that you should report to your care team as soon as possible: Allergic reactions--skin rash, itching, hives, swelling of the face, lips, tongue, or  throat Infection--fever, chills, cough, sore throat, wounds that don't heal, pain or trouble when passing urine, general feeling of discomfort or being unwell Liver injury--right upper belly pain, loss of appetite, nausea, light-colored stool, dark yellow or brown urine, yellowing skin or eyes, unusual weakness or fatigue Side effects that usually do not require medical attention (report to your care team if they continue or are bothersome): Fatigue Headache Pain, redness, or irritation at injection site Runny or stuffy nose Sore throat This list may not describe all possible side effects. Call your doctor for medical advice about side effects. You may report side effects to FDA at 1-800-FDA-1088. Where should I keep my medication? Keep out of the reach of children and pets. Store in a refrigerator. Do not freeze. Protect from light. Keep it in the original carton until you are ready to take it. See product for storage information. Each product may have different instructions. Remove the dose from the carton about 30 to 45 minutes before it is time for you to take it. Get rid of any unused medication after the expiration date. To get rid of medications that are no longer needed or have expired: Take the medication to a medication take-back program. Check with your pharmacy or law enforcement to find a location. If you cannot return the medication, ask your pharmacist or care team how to get rid of this medication safely. NOTE: This sheet is a summary. It may not cover all possible information. If you have questions about this medicine, talk to your doctor, pharmacist, or health care provider.  2024 Elsevier/Gold Standard (2023-05-14 00:00:00)  Standing Labs We placed an order today for your standing lab work.   Please have your standing labs drawn in April and every 3 months  Please have your labs drawn 2 weeks prior to your appointment so that the provider can discuss your lab results at  your appointment, if possible.  Please note that you may see your imaging and lab results in MyChart before we have reviewed them. We will contact you once all results are reviewed. Please allow our office up to 72 hours to thoroughly review all of the results before contacting the office for clarification of your results.  WALK-IN LAB HOURS  Monday through Thursday from 8:00 am - 4:30 pm and Friday from 8:00 am-12:00 pm.  Patients with office visits requiring labs will be seen before walk-in labs.  You may encounter longer than normal wait times. Please allow additional time. Wait times may be shorter on  Monday and Thursday afternoons.  We do not book appointments for walk-in labs. We appreciate your patience and understanding with our staff.   Labs are drawn by [facility]. Please bring your co-pay at the time of your lab draw.  You may receive a bill from [facility] for your lab work.  Please note if you are on Hydroxychloroquine and and an order has been placed for a Hydroxychloroquine level,  you will need to have it drawn 4 hours or more after your last dose.  If you wish to have your labs  drawn at another location, please call the office 24 hours in advance so we can fax the orders.  The office is located at [address], [place], [place] [address]   If you have any questions regarding directions or hours of operation,  please call [phone].   As a reminder, please drink plenty of water  prior to coming for your lab work. Thanks!   Vaccines You are taking a medication(s) that can suppress your immune system.  The following immunizations are recommended: Flu annually Covid-19  RSV Td/Tdap (tetanus, diphtheria, pertussis) every 10 years Pneumonia (Prevnar 15 then Pneumovax 23 at least 1 year apart.  Alternatively, can take Prevnar 20 without needing additional dose) Shingrix: 2 doses from 4 weeks to 6 months apart  Please check with your PCP to make sure you are up to  date.   If you have signs or symptoms of an infection or start antibiotics: First, call your PCP for workup of your infection. Hold your medication through the infection, until you complete your antibiotics, and until symptoms resolve if you take the following: Injectable medication (Actemra, Benlysta, Cimzia, Cosentyx, Enbrel, Humira, Kevzara, Orencia, Remicade, Simponi, Stelara, Taltz, Tremfya) Methotrexate Leflunomide (Arava) Mycophenolate (Cellcept) Earma, Olumiant, or Rinvoq  Myofascial Pain Syndrome and Fibromyalgia Myofascial pain syndrome and fibromyalgia are both pain disorders. You may feel this pain mainly in your muscles. Myofascial pain syndrome: Always has tender points in the muscles that will cause pain when pressed (trigger points). The pain may come and go. Usually affects your neck, upper back, and shoulder areas. The pain often moves into your arms and hands. Fibromyalgia: Has muscle pains and tenderness that come and go. Is often associated with tiredness (fatigue) and sleep problems. Has trigger points. Tends to be long-lasting (chronic), but is not life-threatening. Fibromyalgia and myofascial pain syndrome are not the same. However, they often occur together. If you have both conditions, each can make the other worse. Both are common and can cause enough pain and fatigue to make day-to-day activities difficult. Both can be hard to diagnose because their symptoms are common in many other conditions. What are the causes? The exact causes of these conditions are not known. What increases the risk? You are more likely to develop either of these conditions if: You have a family history of the condition. You are female. You have certain triggers, such as: Spine disorders. An injury (trauma) or other physical stressors. Being under a lot of stress. Medical conditions such as osteoarthritis, rheumatoid arthritis, or lupus. What are the signs or  symptoms? Fibromyalgia The main symptom of fibromyalgia is widespread pain and tenderness in your muscles. Pain is sometimes described as stabbing, shooting, or burning. You may also have: Tingling or numbness. Sleep problems and fatigue. Problems with attention and concentration (fibro fog). Other symptoms may include: Bowel and bladder problems. Headaches. Vision problems. Sensitivity to odors and noises. Depression or mood changes. Painful menstrual periods (dysmenorrhea). Dry skin or eyes. These symptoms can vary over time. Myofascial pain syndrome Symptoms of myofascial pain syndrome include: Tight, ropy bands of muscle. Uncomfortable sensations in muscle areas. These may include aching, cramping, burning, numbness, tingling, and weakness. Difficulty moving certain parts of the body freely (poor range of motion). How is this diagnosed? This condition may be diagnosed by your symptoms and medical history. You will also have a physical exam. In general: Fibromyalgia is diagnosed if you have pain, fatigue, and other symptoms for more than 3 months, and symptoms cannot be  explained by another condition. Myofascial pain syndrome is diagnosed if you have trigger points in your muscles, and those trigger points are tender and cause pain elsewhere in your body (referred pain). How is this treated? Treatment for these conditions depends on the type that you have. For fibromyalgia, a healthy lifestyle is the most important treatment including aerobic and strength exercises. Different types of medicines are used to help treat pain and include: NSAIDs. Medicines for treating depression. Medicines that help control seizures. Medicines that relax the muscles. Treatment for myofascial pain syndrome includes: Pain medicines, such as NSAIDs. Cooling and stretching of muscles. Massage therapy with myofascial release technique. Trigger point injections. Treating these conditions often requires  a team of health care providers. These may include: Your primary care provider. A physical therapist. Complementary health care providers, such as massage therapists or acupuncturists. A psychiatrist for cognitive behavioral therapy. Follow these instructions at home: Medicines Take over-the-counter and prescription medicines only as told by your health care provider. Ask your health care provider if the medicine prescribed to you: Requires you to avoid driving or using machinery. Can cause constipation. You may need to take these actions to prevent or treat constipation: Drink enough fluid to keep your urine pale yellow. Take over-the-counter or prescription medicines. Eat foods that are high in fiber, such as beans, whole grains, and fresh fruits and vegetables. Limit foods that are high in fat and processed sugars, such as fried or sweet foods. Lifestyle  Do exercises as told by your health care provider or physical therapist. Practice relaxation techniques to control your stress. You may want to try: Biofeedback. Visual imagery. Hypnosis. Muscle relaxation. Yoga. Meditation. Maintain a healthy lifestyle. This includes eating a healthy diet and getting enough sleep. Do not use any products that contain nicotine or tobacco. These products include cigarettes, chewing tobacco, and vaping devices, such as e-cigarettes. If you need help quitting, ask your health care provider. General instructions Talk to your health care provider about complementary treatments, such as acupuncture or massage. Do not do activities that stress or strain your muscles. This includes repetitive motions and heavy lifting. Keep all follow-up visits. This is important. Where to find support Consider joining a support group with others who are diagnosed with this condition. National Fibromyalgia Association: fmaware.org Where to find more information U.S. Pain Foundation: uspainfoundation.org Contact a  health care provider if: You have new symptoms. Your symptoms get worse or your pain is severe. You have side effects from your medicines. You have trouble sleeping. Your condition is causing depression or anxiety. Get help right away if: You have thoughts of hurting yourself or others. Get help right away if you feel like you may hurt yourself or others, or have thoughts about taking your own life. Go to your nearest emergency room or: Call 911. Call the National Suicide Prevention Lifeline at 938-002-4158 or 988. This is open 24 hours a day. Text the Crisis Text Line at 906-160-9667. This information is not intended to replace advice given to you by your health care provider. Make sure you discuss any questions you have with your health care provider. Document Revised: 03/09/2022 Document Reviewed: 05/02/2021 Elsevier Patient Education  2024 [employer].

## 2024-06-27 NOTE — Telephone Encounter (Signed)
 Medication consent form for Skyrizi completed during today's OV. Pt was started on Skyrizi by her old provider, will submit PA to determine if approval is already in place.  Attempted to submit a Prior Authorization request to [facility] for SKYRIZI SQ via CoverMyMeds, however request was cancelled stating that patient already has access to the medication and that a PA is not required.   Key: [id number]   It does not appear that anything further is required at this time.

## 2024-06-28 ENCOUNTER — Telehealth: Payer: Self-pay

## 2024-06-28 NOTE — Telephone Encounter (Signed)
" ° ° ° ° ° ° ° ° ° ° ° ° °  Faxed document to Rx Team Appeals  "

## 2024-06-29 ENCOUNTER — Ambulatory Visit: Payer: Self-pay | Admitting: Rheumatology

## 2024-06-29 NOTE — Progress Notes (Signed)
 LDL and triglycerides are elevated.  All other labs are within normal limits.  I will discuss results at the follow-up visit.

## 2024-07-01 LAB — HEMOGLOBIN A1C
Hgb A1c MFr Bld: 5.1 %
Mean Plasma Glucose: 100 mg/dL
eAG (mmol/L): 5.5 mmol/L

## 2024-07-01 LAB — CBC WITH DIFFERENTIAL/PLATELET
Absolute Lymphocytes: 3032 {cells}/uL (ref 850–3900)
Absolute Monocytes: 744 {cells}/uL (ref 200–950)
Basophils Absolute: 56 {cells}/uL (ref 0–200)
Basophils Relative: 0.6 %
Eosinophils Absolute: 102 {cells}/uL (ref 15–500)
Eosinophils Relative: 1.1 %
HCT: 38 % (ref 35.9–46.0)
Hemoglobin: 12 g/dL (ref 11.7–15.5)
MCH: 29.6 pg (ref 27.0–33.0)
MCHC: 31.6 g/dL (ref 31.6–35.4)
MCV: 93.6 fL (ref 81.4–101.7)
MPV: 12.3 fL (ref 7.5–12.5)
Monocytes Relative: 8 %
Neutro Abs: 5366 {cells}/uL (ref 1500–7800)
Neutrophils Relative %: 57.7 %
Platelets: 349 Thousand/uL (ref 140–400)
RBC: 4.06 Million/uL (ref 3.80–5.10)
RDW: 12.9 % (ref 11.0–15.0)
Total Lymphocyte: 32.6 %
WBC: 9.3 Thousand/uL (ref 3.8–10.8)

## 2024-07-01 LAB — SEDIMENTATION RATE: Sed Rate: 9 mm/h (ref 0–30)

## 2024-07-01 LAB — PROTEIN ELECTROPHORESIS, SERUM, WITH REFLEX
Albumin ELP: 4.1 g/dL (ref 3.8–4.8)
Alpha 1: 0.3 g/dL (ref 0.2–0.3)
Alpha 2: 0.6 g/dL (ref 0.5–0.9)
Beta 2: 0.3 g/dL (ref 0.2–0.5)
Beta Globulin: 0.4 g/dL (ref 0.4–0.6)
Gamma Globulin: 0.7 g/dL — ABNORMAL LOW (ref 0.8–1.7)
Total Protein: 6.4 g/dL (ref 6.1–8.1)

## 2024-07-01 LAB — QUANTIFERON-TB GOLD PLUS
Mitogen-NIL: >10 [IU]/mL
NIL: 0.02 [IU]/mL
QuantiFERON-TB Gold Plus: NEGATIVE
TB1-NIL: 0 [IU]/mL
TB2-NIL: 0 [IU]/mL

## 2024-07-01 LAB — COMPREHENSIVE METABOLIC PANEL WITH GFR
AG Ratio: 1.8 (calc) (ref 1.0–2.5)
ALT: 8 U/L (ref 6–29)
AST: 11 U/L (ref 10–35)
Albumin: 4.2 g/dL (ref 3.6–5.1)
Alkaline phosphatase (APISO): 45 U/L (ref 37–153)
BUN: 19 mg/dL (ref 7–25)
CO2: 30 mmol/L (ref 20–32)
Calcium: 9.7 mg/dL (ref 8.6–10.4)
Chloride: 100 mmol/L (ref 98–110)
Creat: 0.66 mg/dL (ref 0.50–1.03)
Globulin: 2.3 g/dL (ref 1.9–3.7)
Glucose, Bld: 85 mg/dL (ref 65–99)
Potassium: 3.7 mmol/L (ref 3.5–5.3)
Sodium: 139 mmol/L (ref 135–146)
Total Bilirubin: 0.9 mg/dL (ref 0.2–1.2)
Total Protein: 6.5 g/dL (ref 6.1–8.1)
eGFR: 104 mL/min/1.73m2

## 2024-07-01 LAB — LIPID PANEL
Cholesterol: 209 mg/dL — ABNORMAL HIGH
HDL: 67 mg/dL
LDL Cholesterol (Calc): 110 mg/dL — ABNORMAL HIGH
Non-HDL Cholesterol (Calc): 142 mg/dL — ABNORMAL HIGH
Total CHOL/HDL Ratio: 3.1 (calc)
Triglycerides: 199 mg/dL — ABNORMAL HIGH

## 2024-07-01 LAB — IGG, IGA, IGM
IgG (Immunoglobin G), Serum: 810 mg/dL (ref 600–1640)
IgM, Serum: 102 mg/dL (ref 50–300)
Immunoglobulin A: 117 mg/dL (ref 47–310)

## 2024-07-01 LAB — CK: Total CK: 53 U/L (ref 21–240)

## 2024-07-01 LAB — HEPATITIS C ANTIBODY: Hepatitis C Ab: NONREACTIVE

## 2024-07-01 LAB — RHEUMATOID FACTOR: Rheumatoid fact SerPl-aCnc: <10 [IU]/mL

## 2024-07-01 LAB — CYCLIC CITRUL PEPTIDE ANTIBODY, IGG: Cyclic Citrullin Peptide Ab: <16 U

## 2024-07-01 LAB — HEPATITIS B SURFACE ANTIGEN: Hepatitis B Surface Ag: NONREACTIVE

## 2024-07-01 LAB — IFE INTERPRETATION

## 2024-07-01 LAB — HEPATITIS B CORE ANTIBODY, IGM: Hep B C IgM: NONREACTIVE

## 2024-07-05 ENCOUNTER — Other Ambulatory Visit (HOSPITAL_COMMUNITY): Payer: Self-pay

## 2024-07-06 ENCOUNTER — Telehealth: Payer: Self-pay | Admitting: Urology

## 2024-07-06 NOTE — Telephone Encounter (Signed)
 PA has been done for [place]  and she needs something until insurance will cover the RX

## 2024-07-07 NOTE — Telephone Encounter (Signed)
 Called patient to let her know that per verbal from MD McKenzie there is no other medication that she can have. Patient made aware with the advisement that the denied PA was sent to our appeals department in hopes of getting it approved patient voiced her understanding stating she is doing everything she can on her end as well

## 2024-07-18 ENCOUNTER — Ambulatory Visit (HOSPITAL_COMMUNITY)

## 2024-07-19 ENCOUNTER — Encounter: Payer: Self-pay | Admitting: Rheumatology

## 2024-07-19 ENCOUNTER — Ambulatory Visit: Admitting: Rheumatology

## 2024-07-19 VITALS — BP 119/77 | HR 77 | Temp 98.0°F | Resp 15 | Ht 62.5 in | Wt 162.8 lb

## 2024-07-19 DIAGNOSIS — G4709 Other insomnia: Secondary | ICD-10-CM | POA: Diagnosis not present

## 2024-07-19 DIAGNOSIS — Z13220 Encounter for screening for lipoid disorders: Secondary | ICD-10-CM

## 2024-07-19 DIAGNOSIS — L409 Psoriasis, unspecified: Secondary | ICD-10-CM | POA: Diagnosis not present

## 2024-07-19 DIAGNOSIS — M797 Fibromyalgia: Secondary | ICD-10-CM

## 2024-07-19 DIAGNOSIS — Z79899 Other long term (current) drug therapy: Secondary | ICD-10-CM | POA: Diagnosis not present

## 2024-07-19 DIAGNOSIS — M79671 Pain in right foot: Secondary | ICD-10-CM | POA: Diagnosis not present

## 2024-07-19 DIAGNOSIS — G8929 Other chronic pain: Secondary | ICD-10-CM

## 2024-07-19 DIAGNOSIS — L4050 Arthropathic psoriasis, unspecified: Secondary | ICD-10-CM | POA: Diagnosis not present

## 2024-07-19 DIAGNOSIS — R5383 Other fatigue: Secondary | ICD-10-CM | POA: Diagnosis not present

## 2024-07-19 DIAGNOSIS — M25511 Pain in right shoulder: Secondary | ICD-10-CM

## 2024-07-19 DIAGNOSIS — M79642 Pain in left hand: Secondary | ICD-10-CM

## 2024-07-19 DIAGNOSIS — J302 Other seasonal allergic rhinitis: Secondary | ICD-10-CM

## 2024-07-19 DIAGNOSIS — Z860100 Personal history of colon polyps, unspecified: Secondary | ICD-10-CM

## 2024-07-19 DIAGNOSIS — Z131 Encounter for screening for diabetes mellitus: Secondary | ICD-10-CM

## 2024-07-19 DIAGNOSIS — M545 Low back pain, unspecified: Secondary | ICD-10-CM

## 2024-07-19 DIAGNOSIS — M533 Sacrococcygeal disorders, not elsewhere classified: Secondary | ICD-10-CM

## 2024-07-19 DIAGNOSIS — N3010 Interstitial cystitis (chronic) without hematuria: Secondary | ICD-10-CM

## 2024-07-19 DIAGNOSIS — F419 Anxiety disorder, unspecified: Secondary | ICD-10-CM

## 2024-07-19 DIAGNOSIS — F32A Depression, unspecified: Secondary | ICD-10-CM

## 2024-07-19 DIAGNOSIS — N200 Calculus of kidney: Secondary | ICD-10-CM

## 2024-07-19 DIAGNOSIS — I10 Essential (primary) hypertension: Secondary | ICD-10-CM

## 2024-07-19 DIAGNOSIS — Z8269 Family history of other diseases of the musculoskeletal system and connective tissue: Secondary | ICD-10-CM

## 2024-07-19 DIAGNOSIS — M25512 Pain in left shoulder: Secondary | ICD-10-CM

## 2024-07-19 DIAGNOSIS — M79641 Pain in right hand: Secondary | ICD-10-CM

## 2024-07-19 DIAGNOSIS — M5030 Other cervical disc degeneration, unspecified cervical region: Secondary | ICD-10-CM | POA: Diagnosis not present

## 2024-07-19 DIAGNOSIS — K219 Gastro-esophageal reflux disease without esophagitis: Secondary | ICD-10-CM

## 2024-07-19 DIAGNOSIS — M79672 Pain in left foot: Secondary | ICD-10-CM

## 2024-07-19 DIAGNOSIS — N946 Dysmenorrhea, unspecified: Secondary | ICD-10-CM

## 2024-07-19 NOTE — Patient Instructions (Signed)
 Standing Labs We placed an order today for your standing lab work.   Please have your standing labs drawn in April and every 3 months  Please have your labs drawn 2 weeks prior to your appointment so that the provider can discuss your lab results at your appointment, if possible.  Please note that you may see your imaging and lab results in MyChart before we have reviewed them. We will contact you once all results are reviewed. Please allow our office up to 72 hours to thoroughly review all of the results before contacting the office for clarification of your results.  WALK-IN LAB HOURS  Monday through Thursday from 8:00 am - 4:00 pm and Friday from 8:00 am-12:00 pm.  Patients with office visits requiring labs will be seen before walk-in labs.  You may encounter longer than normal wait times. Please allow additional time. Wait times may be shorter on  Monday and Thursday afternoons.  We do not book appointments for walk-in labs. We appreciate your patience and understanding with our staff.   Labs are drawn by [facility]. Please bring your co-pay at the time of your lab draw.  You may receive a bill from [facility] for your lab work.  Please note if you are on Hydroxychloroquine and and an order has been placed for a Hydroxychloroquine level,  you will need to have it drawn 4 hours or more after your last dose.  If you wish to have your labs drawn at another location, please call the office 24 hours in advance so we can fax the orders.  The office is located at [address], [address], [place], [place] [address]   If you have any questions regarding directions or hours of operation,  please call [phone].   As a reminder, please drink plenty of water  prior to coming for your lab work. Thanks!   Vaccines You are taking a medication(s) that can suppress your immune system.  The following immunizations are recommended: Flu annually Covid-19  RSV Td/Tdap (tetanus, diphtheria, pertussis)  every 10 years Pneumonia (Prevnar 15 then Pneumovax 23 at least 1 year apart.  Alternatively, can take Prevnar 20 without needing additional dose) Shingrix: 2 doses from 4 weeks to 6 months apart  Please check with your PCP to make sure you are up to date.   If you have signs or symptoms of an infection or start antibiotics: First, call your PCP for workup of your infection. Hold your medication through the infection, until you complete your antibiotics, and until symptoms resolve if you take the following: Injectable medication (Actemra, Benlysta, Cimzia, Cosentyx, Enbrel, Humira, Kevzara, Orencia, Remicade, Simponi, Stelara, Taltz, Tremfya) Methotrexate Leflunomide (Arava) Mycophenolate (Cellcept) Earma, Olumiant, or Rinvoq

## 2024-07-21 ENCOUNTER — Ambulatory Visit (HOSPITAL_COMMUNITY): Admission: RE | Admit: 2024-07-21

## 2024-07-21 DIAGNOSIS — N200 Calculus of kidney: Secondary | ICD-10-CM

## 2024-07-25 ENCOUNTER — Ambulatory Visit: Admitting: Adult Health

## 2024-07-28 ENCOUNTER — Ambulatory Visit: Admitting: Urology

## 2024-12-20 ENCOUNTER — Ambulatory Visit: Admitting: Rheumatology
